# Patient Record
Sex: Female | Born: 1945 | Race: White | Hispanic: No | Marital: Married | Smoking: Former smoker
Health system: Southern US, Community
[De-identification: ages and names within clinical notes are randomized; demographics above are authoritative.]

## PROBLEM LIST (undated history)

## (undated) DIAGNOSIS — Z8719 Personal history of other diseases of the digestive system: Secondary | ICD-10-CM

## (undated) DIAGNOSIS — Z8619 Personal history of other infectious and parasitic diseases: Secondary | ICD-10-CM

## (undated) DIAGNOSIS — I10 Essential (primary) hypertension: Secondary | ICD-10-CM

## (undated) DIAGNOSIS — T7840XA Allergy, unspecified, initial encounter: Secondary | ICD-10-CM

## (undated) DIAGNOSIS — I639 Cerebral infarction, unspecified: Secondary | ICD-10-CM

## (undated) DIAGNOSIS — K2950 Unspecified chronic gastritis without bleeding: Secondary | ICD-10-CM

## (undated) DIAGNOSIS — I480 Paroxysmal atrial fibrillation: Secondary | ICD-10-CM

## (undated) DIAGNOSIS — I499 Cardiac arrhythmia, unspecified: Secondary | ICD-10-CM

## (undated) DIAGNOSIS — J449 Chronic obstructive pulmonary disease, unspecified: Secondary | ICD-10-CM

## (undated) DIAGNOSIS — F32A Depression, unspecified: Secondary | ICD-10-CM

## (undated) DIAGNOSIS — K219 Gastro-esophageal reflux disease without esophagitis: Secondary | ICD-10-CM

## (undated) DIAGNOSIS — Z950 Presence of cardiac pacemaker: Secondary | ICD-10-CM

## (undated) DIAGNOSIS — F329 Major depressive disorder, single episode, unspecified: Secondary | ICD-10-CM

## (undated) DIAGNOSIS — E039 Hypothyroidism, unspecified: Secondary | ICD-10-CM

## (undated) DIAGNOSIS — R32 Unspecified urinary incontinence: Secondary | ICD-10-CM

## (undated) DIAGNOSIS — K5790 Diverticulosis of intestine, part unspecified, without perforation or abscess without bleeding: Secondary | ICD-10-CM

## (undated) DIAGNOSIS — E785 Hyperlipidemia, unspecified: Secondary | ICD-10-CM

## (undated) DIAGNOSIS — E0500 Thyrotoxicosis with diffuse goiter without thyrotoxic crisis or storm: Secondary | ICD-10-CM

## (undated) DIAGNOSIS — M2040 Other hammer toe(s) (acquired), unspecified foot: Secondary | ICD-10-CM

## (undated) DIAGNOSIS — M2011 Hallux valgus (acquired), right foot: Secondary | ICD-10-CM

## (undated) DIAGNOSIS — G8929 Other chronic pain: Secondary | ICD-10-CM

## (undated) HISTORY — PX: APPENDECTOMY: SHX54

## (undated) HISTORY — PX: EYE SURGERY: SHX253

## (undated) HISTORY — PX: OTHER SURGICAL HISTORY: SHX169

## (undated) HISTORY — PX: FOOT SURGERY: SHX648

## (undated) HISTORY — DX: Essential (primary) hypertension: I10

## (undated) HISTORY — PX: TONSILLECTOMY: SUR1361

## (undated) HISTORY — DX: Allergy, unspecified, initial encounter: T78.40XA

## (undated) HISTORY — DX: Unspecified urinary incontinence: R32

## (undated) HISTORY — DX: Gastro-esophageal reflux disease without esophagitis: K21.9

## (undated) HISTORY — PX: VAGINAL HYSTERECTOMY: SUR661

---

## 1998-08-24 HISTORY — PX: COLONOSCOPY: SHX174

## 2005-01-05 ENCOUNTER — Ambulatory Visit: Payer: Self-pay | Admitting: Family Medicine

## 2005-01-23 ENCOUNTER — Ambulatory Visit: Payer: Self-pay | Admitting: Family Medicine

## 2005-08-21 ENCOUNTER — Ambulatory Visit: Payer: Self-pay | Admitting: Podiatry

## 2006-03-01 ENCOUNTER — Ambulatory Visit: Payer: Self-pay | Admitting: Family Medicine

## 2008-04-02 ENCOUNTER — Ambulatory Visit: Payer: Self-pay | Admitting: Family Medicine

## 2008-04-04 ENCOUNTER — Ambulatory Visit: Payer: Self-pay | Admitting: Family Medicine

## 2008-10-10 ENCOUNTER — Ambulatory Visit: Payer: Self-pay | Admitting: Family Medicine

## 2008-11-06 ENCOUNTER — Ambulatory Visit: Payer: Self-pay | Admitting: Family Medicine

## 2009-04-08 ENCOUNTER — Ambulatory Visit: Payer: Self-pay | Admitting: Family Medicine

## 2010-05-26 ENCOUNTER — Ambulatory Visit: Payer: Self-pay | Admitting: Family Medicine

## 2010-08-12 ENCOUNTER — Ambulatory Visit: Payer: Self-pay | Admitting: Family Medicine

## 2011-06-18 ENCOUNTER — Ambulatory Visit: Payer: Self-pay | Admitting: Family Medicine

## 2011-10-21 ENCOUNTER — Ambulatory Visit: Payer: Self-pay | Admitting: Family Medicine

## 2011-12-02 DIAGNOSIS — Z45018 Encounter for adjustment and management of other part of cardiac pacemaker: Secondary | ICD-10-CM | POA: Insufficient documentation

## 2012-09-22 DIAGNOSIS — Z8659 Personal history of other mental and behavioral disorders: Secondary | ICD-10-CM | POA: Insufficient documentation

## 2013-03-16 ENCOUNTER — Ambulatory Visit: Payer: Self-pay | Admitting: Family Medicine

## 2013-07-14 DIAGNOSIS — Z45018 Encounter for adjustment and management of other part of cardiac pacemaker: Secondary | ICD-10-CM | POA: Diagnosis not present

## 2013-08-04 DIAGNOSIS — J019 Acute sinusitis, unspecified: Secondary | ICD-10-CM | POA: Diagnosis not present

## 2013-10-05 DIAGNOSIS — I442 Atrioventricular block, complete: Secondary | ICD-10-CM | POA: Diagnosis not present

## 2013-10-27 DIAGNOSIS — Z111 Encounter for screening for respiratory tuberculosis: Secondary | ICD-10-CM | POA: Diagnosis not present

## 2013-10-27 DIAGNOSIS — I10 Essential (primary) hypertension: Secondary | ICD-10-CM | POA: Diagnosis not present

## 2013-10-27 DIAGNOSIS — E039 Hypothyroidism, unspecified: Secondary | ICD-10-CM | POA: Diagnosis not present

## 2013-10-27 DIAGNOSIS — E785 Hyperlipidemia, unspecified: Secondary | ICD-10-CM | POA: Diagnosis not present

## 2013-10-27 DIAGNOSIS — R894 Abnormal immunological findings in specimens from other organs, systems and tissues: Secondary | ICD-10-CM | POA: Diagnosis not present

## 2013-11-16 DIAGNOSIS — E039 Hypothyroidism, unspecified: Secondary | ICD-10-CM | POA: Diagnosis not present

## 2013-11-16 DIAGNOSIS — E785 Hyperlipidemia, unspecified: Secondary | ICD-10-CM | POA: Diagnosis not present

## 2013-11-16 DIAGNOSIS — I10 Essential (primary) hypertension: Secondary | ICD-10-CM | POA: Diagnosis not present

## 2013-11-16 DIAGNOSIS — I43 Cardiomyopathy in diseases classified elsewhere: Secondary | ICD-10-CM | POA: Diagnosis not present

## 2014-01-16 DIAGNOSIS — Z950 Presence of cardiac pacemaker: Secondary | ICD-10-CM | POA: Diagnosis not present

## 2014-03-13 DIAGNOSIS — M722 Plantar fascial fibromatosis: Secondary | ICD-10-CM | POA: Diagnosis not present

## 2014-03-13 DIAGNOSIS — M2040 Other hammer toe(s) (acquired), unspecified foot: Secondary | ICD-10-CM | POA: Diagnosis not present

## 2014-03-13 DIAGNOSIS — M79609 Pain in unspecified limb: Secondary | ICD-10-CM | POA: Diagnosis not present

## 2014-03-23 DIAGNOSIS — M722 Plantar fascial fibromatosis: Secondary | ICD-10-CM | POA: Diagnosis not present

## 2014-03-23 DIAGNOSIS — D485 Neoplasm of uncertain behavior of skin: Secondary | ICD-10-CM | POA: Diagnosis not present

## 2014-04-06 DIAGNOSIS — Z01818 Encounter for other preprocedural examination: Secondary | ICD-10-CM | POA: Diagnosis not present

## 2014-04-06 DIAGNOSIS — E039 Hypothyroidism, unspecified: Secondary | ICD-10-CM | POA: Diagnosis not present

## 2014-04-10 ENCOUNTER — Ambulatory Visit: Payer: Self-pay | Admitting: Podiatry

## 2014-04-10 DIAGNOSIS — M722 Plantar fascial fibromatosis: Secondary | ICD-10-CM | POA: Diagnosis not present

## 2014-04-10 DIAGNOSIS — M2040 Other hammer toe(s) (acquired), unspecified foot: Secondary | ICD-10-CM | POA: Diagnosis not present

## 2014-04-13 ENCOUNTER — Ambulatory Visit: Payer: Self-pay | Admitting: Podiatry

## 2014-04-13 DIAGNOSIS — E079 Disorder of thyroid, unspecified: Secondary | ICD-10-CM | POA: Diagnosis not present

## 2014-04-13 DIAGNOSIS — Z87891 Personal history of nicotine dependence: Secondary | ICD-10-CM | POA: Diagnosis not present

## 2014-04-13 DIAGNOSIS — J449 Chronic obstructive pulmonary disease, unspecified: Secondary | ICD-10-CM | POA: Diagnosis not present

## 2014-04-13 DIAGNOSIS — Z79899 Other long term (current) drug therapy: Secondary | ICD-10-CM | POA: Diagnosis not present

## 2014-04-13 DIAGNOSIS — Z7982 Long term (current) use of aspirin: Secondary | ICD-10-CM | POA: Diagnosis not present

## 2014-04-13 DIAGNOSIS — M25579 Pain in unspecified ankle and joints of unspecified foot: Secondary | ICD-10-CM | POA: Diagnosis not present

## 2014-04-13 DIAGNOSIS — Z880 Allergy status to penicillin: Secondary | ICD-10-CM | POA: Diagnosis not present

## 2014-04-13 DIAGNOSIS — M722 Plantar fascial fibromatosis: Secondary | ICD-10-CM | POA: Diagnosis not present

## 2014-04-13 DIAGNOSIS — R0989 Other specified symptoms and signs involving the circulatory and respiratory systems: Secondary | ICD-10-CM | POA: Diagnosis not present

## 2014-04-13 DIAGNOSIS — R0609 Other forms of dyspnea: Secondary | ICD-10-CM | POA: Diagnosis not present

## 2014-04-13 DIAGNOSIS — M79609 Pain in unspecified limb: Secondary | ICD-10-CM | POA: Diagnosis not present

## 2014-04-13 DIAGNOSIS — Q6689 Other  specified congenital deformities of feet: Secondary | ICD-10-CM | POA: Diagnosis not present

## 2014-04-13 DIAGNOSIS — M2040 Other hammer toe(s) (acquired), unspecified foot: Secondary | ICD-10-CM | POA: Diagnosis not present

## 2014-04-13 DIAGNOSIS — D481 Neoplasm of uncertain behavior of connective and other soft tissue: Secondary | ICD-10-CM | POA: Diagnosis not present

## 2014-04-13 DIAGNOSIS — Z950 Presence of cardiac pacemaker: Secondary | ICD-10-CM | POA: Diagnosis not present

## 2014-04-17 DIAGNOSIS — M79609 Pain in unspecified limb: Secondary | ICD-10-CM | POA: Diagnosis not present

## 2014-04-18 LAB — PATHOLOGY REPORT

## 2014-04-20 DIAGNOSIS — I498 Other specified cardiac arrhythmias: Secondary | ICD-10-CM | POA: Diagnosis not present

## 2014-04-20 DIAGNOSIS — I442 Atrioventricular block, complete: Secondary | ICD-10-CM | POA: Diagnosis not present

## 2014-04-20 DIAGNOSIS — Z45018 Encounter for adjustment and management of other part of cardiac pacemaker: Secondary | ICD-10-CM | POA: Diagnosis not present

## 2014-06-05 DIAGNOSIS — M79672 Pain in left foot: Secondary | ICD-10-CM | POA: Diagnosis not present

## 2014-06-13 DIAGNOSIS — Z23 Encounter for immunization: Secondary | ICD-10-CM | POA: Diagnosis not present

## 2014-06-18 DIAGNOSIS — J329 Chronic sinusitis, unspecified: Secondary | ICD-10-CM | POA: Diagnosis not present

## 2014-06-18 DIAGNOSIS — J209 Acute bronchitis, unspecified: Secondary | ICD-10-CM | POA: Diagnosis not present

## 2014-06-18 DIAGNOSIS — J309 Allergic rhinitis, unspecified: Secondary | ICD-10-CM | POA: Diagnosis not present

## 2014-07-23 ENCOUNTER — Ambulatory Visit: Payer: Self-pay | Admitting: Family Medicine

## 2014-07-23 DIAGNOSIS — R0981 Nasal congestion: Secondary | ICD-10-CM | POA: Diagnosis not present

## 2014-07-23 DIAGNOSIS — R05 Cough: Secondary | ICD-10-CM | POA: Diagnosis not present

## 2014-07-23 DIAGNOSIS — J209 Acute bronchitis, unspecified: Secondary | ICD-10-CM | POA: Diagnosis not present

## 2014-07-25 DIAGNOSIS — J209 Acute bronchitis, unspecified: Secondary | ICD-10-CM | POA: Diagnosis not present

## 2014-08-01 DIAGNOSIS — Z45018 Encounter for adjustment and management of other part of cardiac pacemaker: Secondary | ICD-10-CM | POA: Diagnosis not present

## 2014-08-03 DIAGNOSIS — H25013 Cortical age-related cataract, bilateral: Secondary | ICD-10-CM | POA: Diagnosis not present

## 2014-08-06 DIAGNOSIS — I43 Cardiomyopathy in diseases classified elsewhere: Secondary | ICD-10-CM | POA: Diagnosis not present

## 2014-08-06 DIAGNOSIS — E039 Hypothyroidism, unspecified: Secondary | ICD-10-CM | POA: Diagnosis not present

## 2014-08-06 DIAGNOSIS — E784 Other hyperlipidemia: Secondary | ICD-10-CM | POA: Diagnosis not present

## 2014-08-06 DIAGNOSIS — I10 Essential (primary) hypertension: Secondary | ICD-10-CM | POA: Diagnosis not present

## 2014-08-06 DIAGNOSIS — F329 Major depressive disorder, single episode, unspecified: Secondary | ICD-10-CM | POA: Diagnosis not present

## 2014-08-06 DIAGNOSIS — J309 Allergic rhinitis, unspecified: Secondary | ICD-10-CM | POA: Diagnosis not present

## 2014-09-03 DIAGNOSIS — E039 Hypothyroidism, unspecified: Secondary | ICD-10-CM | POA: Diagnosis not present

## 2014-09-03 DIAGNOSIS — I10 Essential (primary) hypertension: Secondary | ICD-10-CM | POA: Diagnosis not present

## 2014-09-03 DIAGNOSIS — I43 Cardiomyopathy in diseases classified elsewhere: Secondary | ICD-10-CM | POA: Diagnosis not present

## 2014-09-03 DIAGNOSIS — F329 Major depressive disorder, single episode, unspecified: Secondary | ICD-10-CM | POA: Diagnosis not present

## 2014-09-03 DIAGNOSIS — E784 Other hyperlipidemia: Secondary | ICD-10-CM | POA: Diagnosis not present

## 2014-10-22 DIAGNOSIS — E784 Other hyperlipidemia: Secondary | ICD-10-CM | POA: Diagnosis not present

## 2014-10-22 DIAGNOSIS — Z23 Encounter for immunization: Secondary | ICD-10-CM | POA: Diagnosis not present

## 2014-10-23 LAB — HM MAMMOGRAPHY

## 2014-10-24 ENCOUNTER — Ambulatory Visit: Payer: Self-pay | Admitting: Family Medicine

## 2014-10-24 DIAGNOSIS — Z1231 Encounter for screening mammogram for malignant neoplasm of breast: Secondary | ICD-10-CM | POA: Diagnosis not present

## 2014-11-07 DIAGNOSIS — I442 Atrioventricular block, complete: Secondary | ICD-10-CM | POA: Diagnosis not present

## 2014-11-07 DIAGNOSIS — Z45018 Encounter for adjustment and management of other part of cardiac pacemaker: Secondary | ICD-10-CM | POA: Diagnosis not present

## 2014-12-03 DIAGNOSIS — N61 Inflammatory disorders of breast: Secondary | ICD-10-CM | POA: Diagnosis not present

## 2014-12-03 DIAGNOSIS — B372 Candidiasis of skin and nail: Secondary | ICD-10-CM | POA: Diagnosis not present

## 2014-12-03 DIAGNOSIS — J209 Acute bronchitis, unspecified: Secondary | ICD-10-CM | POA: Diagnosis not present

## 2014-12-03 DIAGNOSIS — J301 Allergic rhinitis due to pollen: Secondary | ICD-10-CM | POA: Diagnosis not present

## 2014-12-07 ENCOUNTER — Ambulatory Visit
Admit: 2014-12-07 | Disposition: A | Discharge status: Home / Self Care | Payer: Self-pay | Attending: Family Medicine | Admitting: Family Medicine

## 2014-12-07 DIAGNOSIS — F339 Major depressive disorder, recurrent, unspecified: Secondary | ICD-10-CM | POA: Insufficient documentation

## 2014-12-07 DIAGNOSIS — Z01818 Encounter for other preprocedural examination: Secondary | ICD-10-CM | POA: Insufficient documentation

## 2014-12-07 DIAGNOSIS — J209 Acute bronchitis, unspecified: Secondary | ICD-10-CM | POA: Diagnosis not present

## 2014-12-07 DIAGNOSIS — J449 Chronic obstructive pulmonary disease, unspecified: Secondary | ICD-10-CM | POA: Diagnosis not present

## 2014-12-07 DIAGNOSIS — E7849 Other hyperlipidemia: Secondary | ICD-10-CM | POA: Insufficient documentation

## 2014-12-07 DIAGNOSIS — Z8639 Personal history of other endocrine, nutritional and metabolic disease: Secondary | ICD-10-CM | POA: Insufficient documentation

## 2014-12-07 DIAGNOSIS — E039 Hypothyroidism, unspecified: Secondary | ICD-10-CM | POA: Insufficient documentation

## 2014-12-07 DIAGNOSIS — I10 Essential (primary) hypertension: Secondary | ICD-10-CM | POA: Insufficient documentation

## 2014-12-07 DIAGNOSIS — R0989 Other specified symptoms and signs involving the circulatory and respiratory systems: Secondary | ICD-10-CM | POA: Diagnosis not present

## 2014-12-07 DIAGNOSIS — R079 Chest pain, unspecified: Secondary | ICD-10-CM | POA: Diagnosis not present

## 2014-12-07 DIAGNOSIS — R05 Cough: Secondary | ICD-10-CM | POA: Diagnosis not present

## 2014-12-07 DIAGNOSIS — R0602 Shortness of breath: Secondary | ICD-10-CM | POA: Diagnosis not present

## 2014-12-15 NOTE — Op Note (Signed)
PATIENT NAME:  Danielle Taylor, Danielle Taylor MR#:  [id number] DATE OF BIRTH:  [birth year]  DATE OF PROCEDURE:  04/13/2014  PREOPERATIVE DIAGNOSES: 1.  Left foot plantar fibroma.  2.  Hammertoe, left 3rd toe.   POSTOPERATIVE DIAGNOSES:  1.  Left foot plantar fibroma.  2.  Hammertoe, left 3rd toe.   PROCEDURES: 1.  Wide excision plantar fibroma, left foot.  2.  Proximal interphalangeal joint arthroplasty, left 3rd toe.  3.  Weil 3rd metatarsal osteotomy.   SURGEON: Dalton Molesworth A. Vickki Taylor, DPM  ANESTHESIA: IV sedation with ankle block.   HEMOSTASIS: Ankle tourniquet inflated to 250 mmHg for approximately 45 minutes.   COMPLICATIONS: None.   SPECIMEN: 5 x 2 x 1.5 cm plantar fibroma, left foot.   OPERATIVE INDICATIONS: This is a 69 year old female who has been seen in the outpatient clinic with the complaint of a painful fibroma on her left foot and a painful forefoot hammertoe and 3rd MTPJ. We have discussed surgical and nonsurgical intervention and she presents today for surgery. All risks, benefits, alternatives and complications associated with surgery were discussed with the patient and full informed consent has been given.   DESCRIPTION OF PROCEDURE: The patient was brought into the OR and placed on operating room table in the supine position. IV sedation was administered by the anesthesia team. An ankle block was administered by myself. Lidocaine with epinephrine was infiltrated along the plantar foot incision site. After sterile prep and drape, a curvilinear incision was made overlying the plantar fibroma on the medial band. Blunt dissection was taken down to the medial, lateral, distal and proximal portion of the fibroma. At this time, a wide excision was performed with approximately 5 mm of normal tissue along the excision site. A combination of sharp and blunt dissection was used. The plantar musculature and long flexor tendon was noted and retracted throughout the procedure. This was sent for  pathological examination. The wound was flushed with copious amounts of irrigation. All bleeders were Bovie cauterized. Layered closure was performed with a 4-0 Vicryl for the subcutaneous tissue and a 5-0 Monocryl undyed for the skin. Inflation of the tourniquet was then performed and a curvilinear incision was made beginning from the PIPJ of the 3rd toe proximal to the MTPJ. Sharp and blunt dissection was carried down to the long extensor tendon. Initially, dorsomedial and lateral aspect of the MTPJ capsulotomy was performed. The metatarsal head was noted and a Weil osteotomy was performed. The capital fragment was translocated laterally and stabilized with a 0.045 K wire. Next, a 2.0 mm screw using normal AO technique was placed from dorsal to plantar into the metatarsal head. Good alignment was noted and the MTPJ was in better position. There was still some mild abduction. A capsulotomy was performed laterally and a capsulorrhaphy medially to try to realign the 3rd MTPJ, although residual malalignment was noted. At this time, I felt a PIPJ arthroplasty with realignment of the extensor tendon was warranted. The PIPJ was then opened and the head of the proximal phalanx and base of the middle phalanx was resected of all articular cartilage. This was then stabilized with a 0.045 K wire from the PIPJ crossing the MTPJ into the metatarsal head. Good alignment was noted at this time. The long extensor tendon was then reapproximated just medial to its midline. All areas were flushed with saline and layered closure was performed with a 4-0 Vicryl for the deep layer and a 5-0 nylon for the skin. Then 0.5% Marcaine was placed around  all areas. She was placed in a well compressive sterile dressing. She will be discharged home on p.o. Percocet and nonweightbearing to this left foot. She tolerated the procedure and anesthesia well and was transported from the OR to the PACU with all vital signs stable and neurovascular status  intact.  ____________________________ Danielle Taylor, DPM jaf:sb D: 04/13/2014 09:48:59 ET T: 04/13/2014 11:37:35 ET JOB#: [id number]  cc: Larkin Ina A. Vickki Taylor, DPM, <Dictator> Saniah Schroeter DPM ELECTRONICALLY SIGNED 05/01/2014 13:50

## 2015-02-12 ENCOUNTER — Encounter: Payer: Self-pay | Admitting: *Deleted

## 2015-02-20 ENCOUNTER — Other Ambulatory Visit: Payer: Self-pay | Admitting: Family Medicine

## 2015-02-20 DIAGNOSIS — E785 Hyperlipidemia, unspecified: Secondary | ICD-10-CM

## 2015-03-04 ENCOUNTER — Ambulatory Visit (INDEPENDENT_AMBULATORY_CARE_PROVIDER_SITE_OTHER): Payer: Medicare Other | Admitting: Family Medicine

## 2015-03-04 ENCOUNTER — Encounter: Payer: Self-pay | Admitting: Family Medicine

## 2015-03-04 ENCOUNTER — Other Ambulatory Visit: Payer: Self-pay

## 2015-03-04 VITALS — BP 120/72 | HR 72 | Ht 65.0 in | Wt 189.0 lb

## 2015-03-04 DIAGNOSIS — J209 Acute bronchitis, unspecified: Secondary | ICD-10-CM | POA: Diagnosis not present

## 2015-03-04 DIAGNOSIS — J4520 Mild intermittent asthma, uncomplicated: Secondary | ICD-10-CM | POA: Diagnosis not present

## 2015-03-04 DIAGNOSIS — J45909 Unspecified asthma, uncomplicated: Secondary | ICD-10-CM | POA: Insufficient documentation

## 2015-03-04 MED ORDER — ALBUTEROL SULFATE (2.5 MG/3ML) 0.083% IN NEBU: 2.5000 mg | INHALATION_SOLUTION | Freq: Once | RESPIRATORY_TRACT | Status: DC

## 2015-03-04 MED ORDER — LEVOFLOXACIN 500 MG PO TABS
500.0000 mg | ORAL_TABLET | Freq: Every day | ORAL | Status: DC
Start: 2015-03-04 — End: 2015-05-20

## 2015-03-04 NOTE — Progress Notes (Signed)
Name: Danielle Taylor   MRN: [id number]    DOB: 1946-03-12   Date:03/04/2015       Progress Note  Subjective  Chief Complaint  Chief Complaint  Patient presents with  . Cough    no stuffiness or congestion    Cough This is a recurrent problem. The current episode started more than 1 month ago. The problem has been unchanged. The cough is non-productive. Associated symptoms include shortness of breath and wheezing. Pertinent negatives include no chest pain, chills, ear congestion, ear pain, fever, headaches, heartburn, hemoptysis, myalgias, nasal congestion, postnasal drip, rash or rhinorrhea. Nothing aggravates the symptoms. She has tried a beta-agonist inhaler and steroid inhaler for the symptoms. The treatment provided mild relief. Her past medical history is significant for asthma. There is no history of bronchiectasis, bronchitis, COPD, emphysema, environmental allergies or pneumonia.    No problem-specific assessment & plan notes found for this encounter.   No past medical history on file.  Past Surgical History  Procedure Laterality Date  . Vaginal hysterectomy    . Foot surgery    . Fibroid tumors      fingers and wrist  . Tonsillectomy    . Colonoscopy  2000    No family history on file.  History   Social History  . Marital Status: Married    Spouse Name: N/A  . Number of Children: N/A  . Years of Education: N/A   Occupational History  . Not on file.   Social History Main Topics  . Smoking status: Former Research scientist (life sciences)  . Smokeless tobacco: Not on file  . Alcohol Use: 0.0 oz/week    0 Standard drinks or equivalent per week  . Drug Use: No  . Sexual Activity: Not on file   Other Topics Concern  . Not on file   Social History Narrative    Allergies  Allergen Reactions  . Penicillins      Review of Systems  Constitutional: Negative for fever and chills.  HENT: Negative for ear pain, postnasal drip and rhinorrhea.   Respiratory: Positive for cough,  shortness of breath and wheezing. Negative for hemoptysis.   Cardiovascular: Negative for chest pain.  Gastrointestinal: Negative for heartburn.  Musculoskeletal: Negative for myalgias.  Skin: Negative for rash.  Neurological: Negative for headaches.  Endo/Heme/Allergies: Negative for environmental allergies.     Objective  Filed Vitals:   03/04/15 1509  BP: 120/72  Pulse: 72  Height: 5\' 5"  (1.651 m)  Weight: 189 lb (85.73 kg)  SpO2: 98%    Physical Exam    Assessment & Plan  Problem List Items Addressed This Visit      Respiratory   Acute bronchitis - Primary   Relevant Medications   levofloxacin (LEVAQUIN) 500 MG tablet   Reactive airway disease   Relevant Medications   albuterol (PROVENTIL) (2.5 MG/3ML) 0.083% nebulizer solution 2.5 mg (Start on 03/04/2015  4:00 PM)   Other Relevant Orders   Ambulatory referral to Pulmonology        Dr. Otilio Miu [facility]  03/04/2015

## 2015-03-05 ENCOUNTER — Other Ambulatory Visit: Payer: Self-pay | Admitting: Family Medicine

## 2015-03-05 DIAGNOSIS — I10 Essential (primary) hypertension: Secondary | ICD-10-CM

## 2015-03-15 DIAGNOSIS — Z1211 Encounter for screening for malignant neoplasm of colon: Secondary | ICD-10-CM | POA: Diagnosis not present

## 2015-03-25 DIAGNOSIS — Z01818 Encounter for other preprocedural examination: Secondary | ICD-10-CM | POA: Diagnosis not present

## 2015-03-25 DIAGNOSIS — R0602 Shortness of breath: Secondary | ICD-10-CM | POA: Diagnosis not present

## 2015-03-25 DIAGNOSIS — R05 Cough: Secondary | ICD-10-CM | POA: Diagnosis not present

## 2015-03-25 DIAGNOSIS — J449 Chronic obstructive pulmonary disease, unspecified: Secondary | ICD-10-CM | POA: Diagnosis not present

## 2015-04-09 ENCOUNTER — Other Ambulatory Visit: Payer: Self-pay | Admitting: Family Medicine

## 2015-04-09 DIAGNOSIS — F329 Major depressive disorder, single episode, unspecified: Secondary | ICD-10-CM

## 2015-04-09 DIAGNOSIS — F32A Depression, unspecified: Secondary | ICD-10-CM

## 2015-04-15 DIAGNOSIS — Z01818 Encounter for other preprocedural examination: Secondary | ICD-10-CM | POA: Diagnosis not present

## 2015-04-15 DIAGNOSIS — R05 Cough: Secondary | ICD-10-CM | POA: Diagnosis not present

## 2015-04-15 DIAGNOSIS — J9801 Acute bronchospasm: Secondary | ICD-10-CM | POA: Diagnosis not present

## 2015-04-25 ENCOUNTER — Encounter: Payer: Self-pay | Admitting: *Deleted

## 2015-04-26 ENCOUNTER — Encounter: Payer: Self-pay | Admitting: Gastroenterology

## 2015-04-26 ENCOUNTER — Ambulatory Visit: Payer: Medicare Other | Admitting: Anesthesiology

## 2015-04-26 ENCOUNTER — Encounter
Admission: RE | Disposition: A | Discharge status: Home / Self Care | Payer: Self-pay | Source: Ambulatory Visit | Attending: Gastroenterology

## 2015-04-26 ENCOUNTER — Ambulatory Visit
Admission: RE | Admit: 2015-04-26 | Discharge: 2015-04-26 | Disposition: A | Discharge status: Home / Self Care | Payer: Medicare Other | Source: Ambulatory Visit | Attending: Gastroenterology | Admitting: Gastroenterology

## 2015-04-26 DIAGNOSIS — Z87891 Personal history of nicotine dependence: Secondary | ICD-10-CM | POA: Diagnosis not present

## 2015-04-26 DIAGNOSIS — K648 Other hemorrhoids: Secondary | ICD-10-CM | POA: Diagnosis not present

## 2015-04-26 DIAGNOSIS — Z90710 Acquired absence of both cervix and uterus: Secondary | ICD-10-CM | POA: Diagnosis not present

## 2015-04-26 DIAGNOSIS — E785 Hyperlipidemia, unspecified: Secondary | ICD-10-CM | POA: Insufficient documentation

## 2015-04-26 DIAGNOSIS — E039 Hypothyroidism, unspecified: Secondary | ICD-10-CM | POA: Diagnosis not present

## 2015-04-26 DIAGNOSIS — E0500 Thyrotoxicosis with diffuse goiter without thyrotoxic crisis or storm: Secondary | ICD-10-CM | POA: Diagnosis not present

## 2015-04-26 DIAGNOSIS — Z79899 Other long term (current) drug therapy: Secondary | ICD-10-CM | POA: Diagnosis not present

## 2015-04-26 DIAGNOSIS — Z7982 Long term (current) use of aspirin: Secondary | ICD-10-CM | POA: Insufficient documentation

## 2015-04-26 DIAGNOSIS — Z1211 Encounter for screening for malignant neoplasm of colon: Secondary | ICD-10-CM | POA: Insufficient documentation

## 2015-04-26 DIAGNOSIS — K5730 Diverticulosis of large intestine without perforation or abscess without bleeding: Secondary | ICD-10-CM | POA: Insufficient documentation

## 2015-04-26 DIAGNOSIS — Z950 Presence of cardiac pacemaker: Secondary | ICD-10-CM | POA: Insufficient documentation

## 2015-04-26 DIAGNOSIS — K2950 Unspecified chronic gastritis without bleeding: Secondary | ICD-10-CM | POA: Insufficient documentation

## 2015-04-26 DIAGNOSIS — Z9889 Other specified postprocedural states: Secondary | ICD-10-CM | POA: Diagnosis not present

## 2015-04-26 DIAGNOSIS — F329 Major depressive disorder, single episode, unspecified: Secondary | ICD-10-CM | POA: Diagnosis not present

## 2015-04-26 DIAGNOSIS — Z9049 Acquired absence of other specified parts of digestive tract: Secondary | ICD-10-CM | POA: Insufficient documentation

## 2015-04-26 DIAGNOSIS — J449 Chronic obstructive pulmonary disease, unspecified: Secondary | ICD-10-CM | POA: Insufficient documentation

## 2015-04-26 DIAGNOSIS — K640 First degree hemorrhoids: Secondary | ICD-10-CM | POA: Diagnosis not present

## 2015-04-26 DIAGNOSIS — K5790 Diverticulosis of intestine, part unspecified, without perforation or abscess without bleeding: Secondary | ICD-10-CM | POA: Diagnosis not present

## 2015-04-26 DIAGNOSIS — Z880 Allergy status to penicillin: Secondary | ICD-10-CM | POA: Diagnosis not present

## 2015-04-26 HISTORY — DX: Hypothyroidism, unspecified: E03.9

## 2015-04-26 HISTORY — PX: COLONOSCOPY WITH PROPOFOL: SHX5780

## 2015-04-26 HISTORY — DX: Depression, unspecified: F32.A

## 2015-04-26 HISTORY — DX: Personal history of other diseases of the digestive system: Z87.19

## 2015-04-26 HISTORY — DX: Major depressive disorder, single episode, unspecified: F32.9

## 2015-04-26 HISTORY — DX: Chronic obstructive pulmonary disease, unspecified: J44.9

## 2015-04-26 HISTORY — DX: Unspecified chronic gastritis without bleeding: K29.50

## 2015-04-26 HISTORY — DX: Cardiac arrhythmia, unspecified: I49.9

## 2015-04-26 HISTORY — DX: Thyrotoxicosis with diffuse goiter without thyrotoxic crisis or storm: E05.00

## 2015-04-26 HISTORY — DX: Presence of cardiac pacemaker: Z95.0

## 2015-04-26 HISTORY — DX: Hyperlipidemia, unspecified: E78.5

## 2015-04-26 SURGERY — COLONOSCOPY WITH PROPOFOL
Anesthesia: General

## 2015-04-26 MED ORDER — MIDAZOLAM HCL 2 MG/2ML IJ SOLN
INTRAMUSCULAR | Status: DC | PRN
  Administered 2015-04-26: 1 mg via INTRAVENOUS

## 2015-04-26 MED ORDER — METOPROLOL SUCCINATE ER 50 MG PO TB24
50.0000 mg | ORAL_TABLET | Freq: Once | ORAL | Status: AC
  Administered 2015-04-26: 50 mg via ORAL
  Filled 2015-04-26: qty 1

## 2015-04-26 MED ORDER — SODIUM CHLORIDE 0.9 % IV SOLN
INTRAVENOUS | Status: DC
  Administered 2015-04-26: 1000 mL via INTRAVENOUS
  Administered 2015-04-26: 09:00:00 via INTRAVENOUS

## 2015-04-26 MED ORDER — SODIUM CHLORIDE 0.9 % IV SOLN: INTRAVENOUS | Status: DC

## 2015-04-26 MED ORDER — PROPOFOL 10 MG/ML IV BOLUS
INTRAVENOUS | Status: DC | PRN
  Administered 2015-04-26 (×3): 20 mg via INTRAVENOUS
  Administered 2015-04-26: 10 mg via INTRAVENOUS
  Administered 2015-04-26 (×3): 20 mg via INTRAVENOUS

## 2015-04-26 NOTE — Transfer of Care (Signed)
Immediate Anesthesia Transfer of Care Note  Patient: Danielle Taylor [facility]  Procedure(s) Performed: Procedure(s): COLONOSCOPY WITH PROPOFOL (N/A)  Patient Location: PACU and Endoscopy Unit  Anesthesia Type:General  Level of Consciousness: alert   Airway & Oxygen Therapy: Patient Spontanous Breathing and Patient connected to nasal cannula oxygen  Post-op Assessment: Report given to RN and Post -op Vital signs reviewed and stable  Post vital signs: stable  Last Vitals:  Filed Vitals:   04/26/15 0753  BP: 138/85  Pulse: 92  Temp: 36.2 C  Resp: 18    Complications: No apparent anesthesia complications

## 2015-04-26 NOTE — H&P (Signed)
Primary Care Physician:  Danielle Miu, MD Primary Gastroenterologist:  Dr. Candace Taylor  Pre-Procedure History & Physical: HPI:  Danielle Taylor is a 69 y.o. female is here for an colonoscopy.   Past Medical History  Diagnosis Date  . Dysrhythmia     Complete Heart Block  . Presence of permanent cardiac pacemaker     2012  . Hypothyroidism   . Depression   . Hyperlipidemia   . COPD (chronic obstructive pulmonary disease)   . History of hiatal hernia   . Graves disease   . Gastritis, chronic     Past Surgical History  Procedure Laterality Date  . Vaginal hysterectomy    . Foot surgery    . Fibroid tumors      fingers and wrist  . Tonsillectomy    . Colonoscopy  2000  . Appendectomy      Prior to Admission medications   Medication Sig Start Date End Date Taking? Authorizing Provider  albuterol (PROVENTIL HFA;VENTOLIN HFA) 108 (90 BASE) MCG/ACT inhaler Inhale 1 puff into the lungs 3 (three) times daily. 12/07/14  Yes Historical Provider, MD  aspirin EC 81 MG tablet Take 81 mg by mouth daily.   Yes Historical Provider, MD  atorvastatin (LIPITOR) 20 MG tablet Take 20 mg by mouth daily.   Yes Historical Provider, MD  citalopram (CELEXA) 40 MG tablet TAKE 1 TABLET BY MOUTH EVERY DAY 04/09/15  Yes Juline Patch, MD  gemfibrozil (LOPID) 600 MG tablet TAKE 1 TABLET BY MOUTH DAILY 02/21/15  Yes Juline Patch, MD  levofloxacin (LEVAQUIN) 500 MG tablet Take 1 tablet (500 mg total) by mouth daily. 03/04/15  Yes Juline Patch, MD  lisinopril (PRINIVIL,ZESTRIL) 5 MG tablet TAKE 1 TABLET BY MOUTH EVERY DAY 03/05/15  Yes Juline Patch, MD  loratadine (CLARITIN) 10 MG tablet Take 1 tablet by mouth daily. 08/06/14  Yes Historical Provider, MD  metoprolol succinate (TOPROL-XL) 50 MG 24 hr tablet Take 1 tablet by mouth daily. 09/03/14  Yes Historical Provider, MD  nystatin cream (MYCOSTATIN) 1 application 2 (two) times daily. 12/03/14  Yes Historical Provider, MD  simvastatin (ZOCOR) 20 MG  tablet Take 1 tablet by mouth daily. 09/03/14  Yes Historical Provider, MD  levothyroxine (SYNTHROID, LEVOTHROID) 112 MCG tablet Take 1 tablet by mouth daily. 09/03/14   Historical Provider, MD    Allergies as of 03/29/2015 - Review Complete 03/04/2015  Allergen Reaction Noted  . Penicillins  12/07/2014    History reviewed. No pertinent family history.  Social History   Social History  . Marital Status: Married    Spouse Name: N/A  . Number of Children: N/A  . Years of Education: N/A   Occupational History  . Not on file.   Social History Main Topics  . Smoking status: Former Research scientist (life sciences)  . Smokeless tobacco: Never Used  . Alcohol Use: 0.0 oz/week    0 Standard drinks or equivalent per week  . Drug Use: No  . Sexual Activity: Not on file   Other Topics Concern  . Not on file   Social History Narrative    Review of Systems: See HPI, otherwise negative ROS  Physical Exam: BP 138/85 mmHg  Pulse 92  Temp(Src) 97.2 F (36.2 C) (Tympanic)  Resp 18  Ht 5\' 5"  (1.651 m)  Wt 81.647 kg (180 lb)  BMI 29.95 kg/m2  SpO2 96% General:   Alert,  pleasant and cooperative in NAD Head:  Normocephalic and atraumatic. Neck:  Supple;  no masses or thyromegaly. Lungs:  Clear throughout to auscultation.    Heart:  Regular rate and rhythm. Abdomen:  Soft, nontender and nondistended. Normal bowel sounds, without guarding, and without rebound.   Neurologic:  Alert and  oriented x4;  grossly normal neurologically.  Impression/Plan: Danielle Taylor is here for an colonoscopy to be performed for screening.  Risks, benefits, limitations, and alternatives regarding  colonoscopy have been reviewed with the patient.  Questions have been answered.  All parties agreeable.   Danielle Taylor, Danielle Dawn, MD  04/26/2015, 8:10 AM

## 2015-04-26 NOTE — Op Note (Signed)
[facility] Patient Name: Danielle Taylor Procedure Date: 04/26/2015 8:40 AM MRN: [id number] Account #: [id number] Date of Birth: April 04, 1946 Admit Type: Outpatient Age: 69 Room: [facility] ENDO ROOM 4 Gender: Female Note Status: Finalized Procedure:         Colonoscopy Indications:       Screening for colorectal malignant neoplasm Providers:         Lupita Dawn. Candace Cruise, MD Referring MD:      Juline Patch, MD (Referring MD) Medicines:         Monitored Anesthesia Care Complications:     No immediate complications. Procedure:         Pre-Anesthesia Assessment:                    - Prior to the procedure, a History and Physical was                     performed, and patient medications, allergies and                     sensitivities were reviewed. The patient's tolerance of                     previous anesthesia was reviewed.                    - The risks and benefits of the procedure and the sedation                     options and risks were discussed with the patient. All                     questions were answered and informed consent was obtained.                    - After reviewing the risks and benefits, the patient was                     deemed in satisfactory condition to undergo the procedure.                    After obtaining informed consent, the colonoscope was                     passed under direct vision. Throughout the procedure, the                     patient's blood pressure, pulse, and oxygen saturations                     were monitored continuously. The Colonoscope was                     introduced through the anus and advanced to the the cecum,                     identified by appendiceal orifice and ileocecal valve. The                     colonoscopy was performed without difficulty. The patient                     tolerated the procedure well. The quality of the bowel  preparation was fair. Findings:  A few small-mouthed diverticula were found in the sigmoid colon.      The exam was otherwise without abnormality.      The perianal exam findings include non-thrombosed internal hemorrhoids. Impression:        - Diverticulosis in the sigmoid colon.                    - The examination was otherwise normal.                    - Non-thrombosed internal hemorrhoids found on perianal                     exam.                    - No specimens collected. Recommendation:    - Discharge patient to home.                    - Repeat colonoscopy in 10 years for surveillance.                    - The findings and recommendations were discussed with the                     patient. Procedure Code(s): --- Professional ---                    [id number], Colonoscopy, flexible; diagnostic, including                     collection of specimen(s) by brushing or washing, when                     performed (separate procedure) Diagnosis Code(s): --- Professional ---                    Z12.11, Encounter for screening for malignant neoplasm of                     colon                    K64.8, Other hemorrhoids                    K57.30, Diverticulosis of large intestine without                     perforation or abscess without bleeding CPT copyright 2014 American Medical Association. All rights reserved. The codes documented in this report are preliminary and upon coder review may  be revised to meet current compliance requirements. Hulen Luster, MD 04/26/2015 9:07:11 AM This report has been signed electronically. Number of Addenda: 0 Note Initiated On: 04/26/2015 8:40 AM Scope Withdrawal Time: 0 hours 7 minutes 7 seconds  Total Procedure Duration: 0 hours 12 minutes 44 seconds       [facility]

## 2015-04-26 NOTE — Anesthesia Postprocedure Evaluation (Signed)
  Anesthesia Post-op Note  Patient: Danielle Taylor [facility]  Procedure(s) Performed: Procedure(s): COLONOSCOPY WITH PROPOFOL (N/A)  Anesthesia type:General  Patient location: PACU  Post pain: Pain level controlled  Post assessment: Post-op Vital signs reviewed, Patient's Cardiovascular Status Stable, Respiratory Function Stable, Patent Airway and No signs of Nausea or vomiting  Post vital signs: Reviewed and stable  Last Vitals:  Filed Vitals:   04/26/15 0911  BP:   Pulse: 81  Temp: 36.8 C  Resp:     Level of consciousness: awake, alert  and patient cooperative  Complications: No apparent anesthesia complications

## 2015-04-26 NOTE — Anesthesia Preprocedure Evaluation (Signed)
Anesthesia Evaluation  Patient identified by MRN, date of birth, ID band Patient awake    Reviewed: Allergy & Precautions, H&P , NPO status , Patient's Chart, lab work & pertinent test results, reviewed documented beta blocker date and time   Airway Mallampati: II  TM Distance: >3 FB Neck ROM: full    Dental no notable dental hx. (+) Teeth Intact   Pulmonary neg pulmonary ROS, COPDformer smoker,  breath sounds clear to auscultation  Pulmonary exam normal       Cardiovascular Exercise Tolerance: Good hypertension, negative cardio ROS  + dysrhythmias + pacemaker Rhythm:regular Rate:Normal     Neuro/Psych PSYCHIATRIC DISORDERS negative neurological ROS  negative psych ROS   GI/Hepatic negative GI ROS, Neg liver ROS, hiatal hernia,   Endo/Other  negative endocrine ROSHypothyroidism Hyperthyroidism   Renal/GU negative Renal ROS  negative genitourinary   Musculoskeletal   Abdominal   Peds  Hematology negative hematology ROS (+)   Anesthesia Other Findings   Reproductive/Obstetrics negative OB ROS                             Anesthesia Physical Anesthesia Plan  ASA: III  Anesthesia Plan: General   Post-op Pain Management:    Induction:   Airway Management Planned:   Additional Equipment:   Intra-op Plan:   Post-operative Plan:   Informed Consent: I have reviewed the patients History and Physical, chart, labs and discussed the procedure including the risks, benefits and alternatives for the proposed anesthesia with the patient or authorized representative who has indicated his/her understanding and acceptance.   Dental Advisory Given  Plan Discussed with: CRNA  Anesthesia Plan Comments:         Anesthesia Quick Evaluation

## 2015-05-04 ENCOUNTER — Other Ambulatory Visit: Payer: Self-pay | Admitting: Family Medicine

## 2015-05-04 DIAGNOSIS — E039 Hypothyroidism, unspecified: Secondary | ICD-10-CM

## 2015-05-12 ENCOUNTER — Other Ambulatory Visit: Payer: Self-pay | Admitting: Family Medicine

## 2015-05-12 DIAGNOSIS — E039 Hypothyroidism, unspecified: Secondary | ICD-10-CM

## 2015-05-13 DIAGNOSIS — R05 Cough: Secondary | ICD-10-CM | POA: Diagnosis not present

## 2015-05-13 DIAGNOSIS — J449 Chronic obstructive pulmonary disease, unspecified: Secondary | ICD-10-CM | POA: Diagnosis not present

## 2015-05-15 DIAGNOSIS — Z950 Presence of cardiac pacemaker: Secondary | ICD-10-CM

## 2015-05-15 DIAGNOSIS — Z45018 Encounter for adjustment and management of other part of cardiac pacemaker: Secondary | ICD-10-CM | POA: Diagnosis not present

## 2015-05-15 DIAGNOSIS — I498 Other specified cardiac arrhythmias: Secondary | ICD-10-CM | POA: Insufficient documentation

## 2015-05-20 ENCOUNTER — Encounter: Payer: Self-pay | Admitting: Family Medicine

## 2015-05-20 ENCOUNTER — Ambulatory Visit (INDEPENDENT_AMBULATORY_CARE_PROVIDER_SITE_OTHER): Payer: Medicare Other | Admitting: Family Medicine

## 2015-05-20 VITALS — BP 140/100 | HR 68 | Ht 65.0 in | Wt 188.0 lb

## 2015-05-20 DIAGNOSIS — R059 Cough, unspecified: Secondary | ICD-10-CM

## 2015-05-20 DIAGNOSIS — R05 Cough: Secondary | ICD-10-CM | POA: Diagnosis not present

## 2015-05-20 DIAGNOSIS — I10 Essential (primary) hypertension: Secondary | ICD-10-CM | POA: Diagnosis not present

## 2015-05-20 DIAGNOSIS — E039 Hypothyroidism, unspecified: Secondary | ICD-10-CM

## 2015-05-20 MED ORDER — LOSARTAN POTASSIUM 50 MG PO TABS: 50.0000 mg | ORAL_TABLET | Freq: Every day | ORAL | Status: DC

## 2015-05-20 NOTE — Progress Notes (Signed)
Name: Danielle Taylor   MRN: [id number]    DOB: 1946/03/28   Date:05/20/2015       Progress Note  Subjective  Chief Complaint  Chief Complaint  Patient presents with  . Altered Mental Status    "forgetting what she is saying midway through a sentence, repeating or asking the same question 3-5 times a day" been going on approx 6 months    HPI Comments: Diaphoresis/6wks  Altered Mental Status This is a recurrent problem. The current episode started more than 1 month ago. The problem occurs daily. Associated symptoms include neck pain. Pertinent negatives include no abdominal pain, anorexia, arthralgias, change in bowel habit, chest pain, chills, congestion, coughing, diaphoresis, fatigue, fever, headaches, joint swelling, myalgias, nausea, numbness, rash, sore throat, swollen glands, urinary symptoms, vertigo, visual change, vomiting or weakness. Associated symptoms comments: Balance issue. Nothing aggravates the symptoms. The treatment provided mild relief.  Cough This is a recurrent problem. The current episode started more than 1 month ago. The problem has been gradually worsening. The problem occurs constantly. The cough is non-productive. Associated symptoms include sweats. Pertinent negatives include no chest pain, chills, ear pain, fever, headaches, heartburn, myalgias, rash, sore throat, shortness of breath, weight loss or wheezing. The symptoms are aggravated by pollens. She has tried a beta-agonist inhaler for the symptoms. The treatment provided no relief. There is no history of environmental allergies.  Thyroid Problem Presents for follow-up visit. Symptoms include tremors and weight gain. Patient reports no anxiety, cold intolerance, constipation, depressed mood, diaphoresis, diarrhea, fatigue, hair loss, heat intolerance, hoarse voice, leg swelling, nail problem, palpitations, visual change or weight loss. Past treatments include levothyroxine. The treatment provided moderate  relief. There is no history of atrial fibrillation, dementia, diabetes, Graves' ophthalmopathy, heart failure, hyperlipidemia, neuropathy, obesity or osteopenia.    No problem-specific assessment & plan notes found for this encounter.   Past Medical History  Diagnosis Date  . Dysrhythmia     Complete Heart Block  . Presence of permanent cardiac pacemaker     2012  . Hypothyroidism   . Depression   . Hyperlipidemia   . COPD (chronic obstructive pulmonary disease)   . History of hiatal hernia   . Graves disease   . Gastritis, chronic   . Hypertension   . GERD (gastroesophageal reflux disease)     Past Surgical History  Procedure Laterality Date  . Vaginal hysterectomy    . Foot surgery    . Fibroid tumors      fingers and wrist  . Tonsillectomy    . Colonoscopy  2000  . Appendectomy    . Colonoscopy with propofol N/A 04/26/2015    Procedure: COLONOSCOPY WITH PROPOFOL;  Surgeon: Hulen Luster, MD;  Location: [facility] ENDOSCOPY;  Service: Gastroenterology;  Laterality: N/A;    History reviewed. No pertinent family history.  Social History   Social History  . Marital Status: Married    Spouse Name: N/A  . Number of Children: N/A  . Years of Education: N/A   Occupational History  . Not on file.   Social History Main Topics  . Smoking status: Former Research scientist (life sciences)  . Smokeless tobacco: Never Used  . Alcohol Use: 0.0 oz/week    0 Standard drinks or equivalent per week  . Drug Use: No  . Sexual Activity: Yes   Other Topics Concern  . Not on file   Social History Narrative    Allergies  Allergen Reactions  . Penicillins  Review of Systems  Constitutional: Positive for weight gain. Negative for fever, chills, weight loss, malaise/fatigue, diaphoresis and fatigue.  HENT: Negative for congestion, ear discharge, ear pain, hoarse voice and sore throat.   Eyes: Negative for blurred vision.  Respiratory: Negative for cough, sputum production, shortness of breath and  wheezing.   Cardiovascular: Negative for chest pain, palpitations and leg swelling.  Gastrointestinal: Negative for heartburn, nausea, vomiting, abdominal pain, diarrhea, constipation, blood in stool, melena, anorexia and change in bowel habit.  Genitourinary: Negative for dysuria, urgency, frequency and hematuria.  Musculoskeletal: Positive for neck pain. Negative for myalgias, back pain, joint pain, joint swelling and arthralgias.  Skin: Negative for rash.  Neurological: Positive for tremors. Negative for dizziness, vertigo, tingling, sensory change, focal weakness, weakness, numbness and headaches.  Endo/Heme/Allergies: Negative for environmental allergies, cold intolerance, heat intolerance and polydipsia. Does not bruise/bleed easily.  Psychiatric/Behavioral: Negative for depression and suicidal ideas. The patient is not nervous/anxious and does not have insomnia.      Objective  Filed Vitals:   05/20/15 0918  BP: 140/100  Pulse: 68  Height: 5\' 5"  (1.651 m)  Weight: 188 lb (85.276 kg)    Physical Exam  Constitutional: She is well-developed, well-nourished, and in no distress. No distress.  HENT:  Head: Normocephalic and atraumatic.  Right Ear: External ear normal.  Left Ear: External ear normal.  Nose: Nose normal.  Mouth/Throat: Oropharynx is clear and moist.  Eyes: Conjunctivae and EOM are normal. Pupils are equal, round, and reactive to light. Right eye exhibits no discharge. Left eye exhibits no discharge.  Neck: Normal range of motion. Neck supple. No JVD present. No thyroid mass and no thyromegaly present.  Cardiovascular: Normal rate, regular rhythm, normal heart sounds and intact distal pulses.  Exam reveals no gallop and no friction rub.   No murmur heard. Pulmonary/Chest: Effort normal and breath sounds normal.  Abdominal: Soft. Bowel sounds are normal. She exhibits no mass. There is no tenderness. There is no guarding.  Musculoskeletal: Normal range of motion. She  exhibits no edema.  Lymphadenopathy:    She has no cervical adenopathy.  Neurological: She is alert. She has normal motor skills, normal sensation, normal strength and intact cranial nerves. She has a normal Romberg Test. Gait normal.  Reflex Scores:      Bicep reflexes are 2+ on the right side and 2+ on the left side.      Patellar reflexes are 3+ on the right side and 3+ on the left side.      Achilles reflexes are 2+ on the right side and 2+ on the left side. Skin: Skin is warm and dry. She is not diaphoretic.  Psychiatric: Mood and affect normal.  Cognitive normal      Assessment & Plan  Problem List Items Addressed This Visit      Cardiovascular and Mediastinum   Essential (primary) hypertension   Relevant Medications   losartan (COZAAR) 50 MG tablet   Other Relevant Orders   Renal Function Panel     Endocrine   Hypothyroid - Primary   Relevant Orders   TSH    Other Visit Diagnoses    Cough        Relevant Medications    losartan (COZAAR) 50 MG tablet         Dr. Mac[facility]  05/20/2015

## 2015-05-21 LAB — RENAL FUNCTION PANEL
Albumin: 4.7 g/dL (ref 3.6–4.8)
BUN/Creatinine Ratio: 25 (ref 11–26)
BUN: 19 mg/dL (ref 8–27)
CO2: 24 mmol/L (ref 18–29)
Calcium: 10.2 mg/dL (ref 8.7–10.3)
Chloride: 97 mmol/L (ref 97–108)
Creatinine, Ser: 0.76 mg/dL (ref 0.57–1.00)
GFR, EST AFRICAN AMERICAN: 95 mL/min/{1.73_m2} (ref >59)
GFR, EST NON AFRICAN AMERICAN: 82 mL/min/{1.73_m2} (ref >59)
GLUCOSE: 116 mg/dL — AB (ref 65–99)
POTASSIUM: 4.7 mmol/L (ref 3.5–5.2)
Phosphorus: 3.8 mg/dL (ref 2.5–4.5)
SODIUM: 140 mmol/L (ref 134–144)

## 2015-05-21 LAB — TSH: TSH: 0.871 u[IU]/mL (ref 0.450–4.500)

## 2015-05-25 ENCOUNTER — Other Ambulatory Visit: Payer: Self-pay | Admitting: Family Medicine

## 2015-05-25 DIAGNOSIS — E785 Hyperlipidemia, unspecified: Secondary | ICD-10-CM

## 2015-06-30 ENCOUNTER — Other Ambulatory Visit: Payer: Self-pay | Admitting: Family Medicine

## 2015-07-01 ENCOUNTER — Ambulatory Visit (INDEPENDENT_AMBULATORY_CARE_PROVIDER_SITE_OTHER): Payer: Medicare Other | Admitting: Family Medicine

## 2015-07-01 ENCOUNTER — Encounter: Payer: Self-pay | Admitting: Family Medicine

## 2015-07-01 VITALS — BP 100/80 | HR 70 | Ht 65.0 in | Wt 187.0 lb

## 2015-07-01 DIAGNOSIS — J209 Acute bronchitis, unspecified: Secondary | ICD-10-CM | POA: Diagnosis not present

## 2015-07-01 DIAGNOSIS — J4520 Mild intermittent asthma, uncomplicated: Secondary | ICD-10-CM

## 2015-07-01 MED ORDER — LEVOFLOXACIN 500 MG PO TABS: 500.0000 mg | ORAL_TABLET | Freq: Every day | ORAL | Status: DC

## 2015-07-01 MED ORDER — ALBUTEROL SULFATE HFA 108 (90 BASE) MCG/ACT IN AERS: 1.0000 | INHALATION_SPRAY | Freq: Four times a day (QID) | RESPIRATORY_TRACT | Status: DC | PRN

## 2015-07-01 MED ORDER — PREDNISONE 10 MG PO TABS: 10.0000 mg | ORAL_TABLET | Freq: Every day | ORAL | Status: DC

## 2015-07-01 MED ORDER — GUAIFENESIN-CODEINE 100-10 MG/5ML PO SOLN: 5.0000 mL | Freq: Three times a day (TID) | ORAL | Status: DC | PRN

## 2015-07-01 NOTE — Progress Notes (Signed)
Name: Danielle Taylor   MRN: [id number]    DOB: 09/10/1945   Date:07/01/2015       Progress Note  Subjective  Chief Complaint  Chief Complaint  Patient presents with  . Allergic Rhinitis     refill Loratadine  . Bronchitis    hoarse, cough    Cough This is a recurrent problem. The current episode started in the past 7 days. The problem has been waxing and waning. The problem occurs every few minutes. The cough is productive of purulent sputum. Associated symptoms include nasal congestion, postnasal drip, rhinorrhea, a sore throat and shortness of breath. Pertinent negatives include no chest pain, chills, ear congestion, ear pain, fever, headaches, heartburn, hemoptysis, myalgias, rash, sweats, weight loss or wheezing. The symptoms are aggravated by pollens and cold air. She has tried a beta-agonist inhaler (supposedly) for the symptoms. The treatment provided mild relief. Her past medical history is significant for asthma and bronchitis. There is no history of environmental allergies.    No problem-specific assessment & plan notes found for this encounter.   Past Medical History  Diagnosis Date  . Dysrhythmia     Complete Heart Block  . Presence of permanent cardiac pacemaker     2012  . Hypothyroidism   . Depression   . Hyperlipidemia   . COPD (chronic obstructive pulmonary disease) ([employer])   . History of hiatal hernia   . Graves disease   . Gastritis, chronic   . Hypertension   . GERD (gastroesophageal reflux disease)     Past Surgical History  Procedure Laterality Date  . Vaginal hysterectomy    . Foot surgery    . Fibroid tumors      fingers and wrist  . Tonsillectomy    . Colonoscopy  2000  . Appendectomy    . Colonoscopy with propofol N/A 04/26/2015    Procedure: COLONOSCOPY WITH PROPOFOL;  Surgeon: Hulen Luster, MD;  Location: [facility] ENDOSCOPY;  Service: Gastroenterology;  Laterality: N/A;    History reviewed. No pertinent family history.  Social History    Social History  . Marital Status: Married    Spouse Name: N/A  . Number of Children: N/A  . Years of Education: N/A   Occupational History  . Not on file.   Social History Main Topics  . Smoking status: Former Research scientist (life sciences)  . Smokeless tobacco: Never Used  . Alcohol Use: 0.0 oz/week    0 Standard drinks or equivalent per week  . Drug Use: No  . Sexual Activity: Yes   Other Topics Concern  . Not on file   Social History Narrative    Allergies  Allergen Reactions  . Penicillins      Review of Systems  Constitutional: Negative for fever, chills, weight loss and malaise/fatigue.  HENT: Positive for postnasal drip, rhinorrhea and sore throat. Negative for ear discharge and ear pain.   Eyes: Negative for blurred vision.  Respiratory: Positive for cough and shortness of breath. Negative for hemoptysis, sputum production and wheezing.   Cardiovascular: Negative for chest pain, palpitations and leg swelling.  Gastrointestinal: Negative for heartburn, nausea, abdominal pain, diarrhea, constipation, blood in stool and melena.  Genitourinary: Negative for dysuria, urgency, frequency and hematuria.  Musculoskeletal: Negative for myalgias, back pain, joint pain and neck pain.  Skin: Negative for rash.  Neurological: Negative for dizziness, tingling, sensory change, focal weakness and headaches.  Endo/Heme/Allergies: Negative for environmental allergies and polydipsia. Does not bruise/bleed easily.  Psychiatric/Behavioral: Negative for depression and suicidal  ideas. The patient is not nervous/anxious and does not have insomnia.      Objective  Filed Vitals:   07/01/15 1054  BP: 100/80  Pulse: 70  Height: 5\' 5"  (1.651 m)  Weight: 187 lb (84.823 kg)  SpO2: 97%    Physical Exam  Constitutional: She is well-developed, well-nourished, and in no distress. No distress.  HENT:  Head: Normocephalic and atraumatic.  Right Ear: External ear normal.  Left Ear: External ear normal.   Nose: Nose normal.  Mouth/Throat: Oropharynx is clear and moist.  Eyes: Conjunctivae and EOM are normal. Pupils are equal, round, and reactive to light. Right eye exhibits no discharge. Left eye exhibits no discharge.  Neck: Normal range of motion. Neck supple. No JVD present. No thyromegaly present.  Cardiovascular: Normal rate, regular rhythm, normal heart sounds and intact distal pulses.  Exam reveals no gallop and no friction rub.   No murmur heard. Pulmonary/Chest: Effort normal and breath sounds normal.  Abdominal: Soft. Bowel sounds are normal. She exhibits no mass. There is no tenderness. There is no guarding.  Musculoskeletal: Normal range of motion. She exhibits no edema.  Lymphadenopathy:    She has no cervical adenopathy.  Neurological: She is alert. She has normal reflexes.  Skin: Skin is warm and dry. She is not diaphoretic.  Psychiatric: Mood and affect normal.      Assessment & Plan  Problem List Items Addressed This Visit      Respiratory   Acute bronchitis - Primary   Relevant Medications   beclomethasone (QVAR) 80 MCG/ACT inhaler   levofloxacin (LEVAQUIN) 500 MG tablet   albuterol (PROVENTIL HFA;VENTOLIN HFA) 108 (90 BASE) MCG/ACT inhaler   guaiFENesin-codeine 100-10 MG/5ML syrup   Reactive airway disease   Relevant Medications   beclomethasone (QVAR) 80 MCG/ACT inhaler   levofloxacin (LEVAQUIN) 500 MG tablet   albuterol (PROVENTIL HFA;VENTOLIN HFA) 108 (90 BASE) MCG/ACT inhaler   guaiFENesin-codeine 100-10 MG/5ML syrup        Dr. Deanna Jones [facility]  07/01/2015

## 2015-07-12 DIAGNOSIS — J310 Chronic rhinitis: Secondary | ICD-10-CM | POA: Diagnosis not present

## 2015-07-12 DIAGNOSIS — J4531 Mild persistent asthma with (acute) exacerbation: Secondary | ICD-10-CM | POA: Diagnosis not present

## 2015-07-12 DIAGNOSIS — R05 Cough: Secondary | ICD-10-CM | POA: Diagnosis not present

## 2015-07-12 DIAGNOSIS — R0609 Other forms of dyspnea: Secondary | ICD-10-CM | POA: Diagnosis not present

## 2015-07-15 ENCOUNTER — Ambulatory Visit (INDEPENDENT_AMBULATORY_CARE_PROVIDER_SITE_OTHER): Payer: Medicare Other | Admitting: Family Medicine

## 2015-07-15 ENCOUNTER — Encounter: Payer: Self-pay | Admitting: Family Medicine

## 2015-07-15 VITALS — BP 124/90 | HR 80 | Ht 65.0 in | Wt 187.0 lb

## 2015-07-15 DIAGNOSIS — R05 Cough: Secondary | ICD-10-CM

## 2015-07-15 DIAGNOSIS — I10 Essential (primary) hypertension: Secondary | ICD-10-CM | POA: Diagnosis not present

## 2015-07-15 DIAGNOSIS — F339 Major depressive disorder, recurrent, unspecified: Secondary | ICD-10-CM | POA: Diagnosis not present

## 2015-07-15 DIAGNOSIS — E785 Hyperlipidemia, unspecified: Secondary | ICD-10-CM | POA: Diagnosis not present

## 2015-07-15 DIAGNOSIS — E784 Other hyperlipidemia: Secondary | ICD-10-CM | POA: Diagnosis not present

## 2015-07-15 DIAGNOSIS — E039 Hypothyroidism, unspecified: Secondary | ICD-10-CM | POA: Diagnosis not present

## 2015-07-15 DIAGNOSIS — F329 Major depressive disorder, single episode, unspecified: Secondary | ICD-10-CM

## 2015-07-15 DIAGNOSIS — J4520 Mild intermittent asthma, uncomplicated: Secondary | ICD-10-CM | POA: Diagnosis not present

## 2015-07-15 DIAGNOSIS — R059 Cough, unspecified: Secondary | ICD-10-CM

## 2015-07-15 DIAGNOSIS — Z23 Encounter for immunization: Secondary | ICD-10-CM | POA: Diagnosis not present

## 2015-07-15 DIAGNOSIS — F32A Depression, unspecified: Secondary | ICD-10-CM

## 2015-07-15 DIAGNOSIS — E7849 Other hyperlipidemia: Secondary | ICD-10-CM

## 2015-07-15 MED ORDER — LEVOTHYROXINE SODIUM 112 MCG PO TABS: ORAL_TABLET | ORAL | Status: DC

## 2015-07-15 MED ORDER — ATORVASTATIN CALCIUM 20 MG PO TABS: 20.0000 mg | ORAL_TABLET | Freq: Every day | ORAL | Status: DC

## 2015-07-15 MED ORDER — CITALOPRAM HYDROBROMIDE 40 MG PO TABS: 40.0000 mg | ORAL_TABLET | Freq: Every day | ORAL | Status: DC

## 2015-07-15 MED ORDER — LOSARTAN POTASSIUM 50 MG PO TABS: 50.0000 mg | ORAL_TABLET | Freq: Every day | ORAL | Status: DC

## 2015-07-15 MED ORDER — METOPROLOL SUCCINATE ER 50 MG PO TB24: 50.0000 mg | ORAL_TABLET | Freq: Every day | ORAL | Status: DC

## 2015-07-15 MED ORDER — LORATADINE 10 MG PO TABS: 10.0000 mg | ORAL_TABLET | Freq: Every day | ORAL | Status: DC

## 2015-07-15 MED ORDER — GEMFIBROZIL 600 MG PO TABS: 600.0000 mg | ORAL_TABLET | Freq: Every day | ORAL | Status: DC

## 2015-07-15 NOTE — Progress Notes (Signed)
Name: Danielle Taylor   MRN: [id number]    DOB: 1945/11/30   Date:07/15/2015       Progress Note  Subjective  Chief Complaint  Chief Complaint  Patient presents with  . Depression  . Hypothyroidism  . Hypertension  . Hyperlipidemia    Depression      The patient presents with depression.  This is a chronic problem.  The current episode started more than 1 year ago.   The onset quality is undetermined.   The problem occurs intermittently.  The problem has been waxing and waning since onset.  Associated symptoms include no decreased concentration, no fatigue, no hopelessness, does not have insomnia, not irritable, no decreased interest, no myalgias, no headaches, not sad and no suicidal ideas.     The symptoms are aggravated by nothing.  Past treatments include SSRIs - Selective serotonin reuptake inhibitors.  Compliance with treatment is good.  Past medical history includes hypothyroidism, thyroid problem, anxiety and depression.     Pertinent negatives include no chronic fatigue syndrome, no Alzheimer's disease and no dementia. Hypertension This is a chronic problem. The current episode started more than 1 year ago. The problem has been gradually improving since onset. Associated symptoms include anxiety. Pertinent negatives include no blurred vision, chest pain, headaches, malaise/fatigue, neck pain, orthopnea, palpitations, peripheral edema, PND or shortness of breath. Agents associated with hypertension include NSAIDs and thyroid hormones. There are no known risk factors for coronary artery disease. Past treatments include beta blockers and angiotensin blockers. The current treatment provides moderate improvement. There are no compliance problems.  Hypertensive end-organ damage includes a thyroid problem. There is no history of angina, kidney disease, CAD/MI, CVA, heart failure, left ventricular hypertrophy, PVD, renovascular disease or retinopathy. There is no history of chronic renal  disease or a hypertension causing med.  Hyperlipidemia The current episode started more than 1 year ago. The problem is controlled. Exacerbating diseases include hypothyroidism. She has no history of chronic renal disease. There are no known factors aggravating her hyperlipidemia. Pertinent negatives include no chest pain, focal weakness, myalgias or shortness of breath. Current antihyperlipidemic treatment includes statins and fibric acid derivatives. The current treatment provides significant improvement of lipids. There are no compliance problems.  There are no known risk factors for coronary artery disease.  Thyroid Problem Presents for follow-up visit. Symptoms include anxiety and heat intolerance. Patient reports no cold intolerance, constipation, depressed mood, diaphoresis, diarrhea, dry skin, fatigue, hair loss, hoarse voice, leg swelling, palpitations, weight gain or weight loss. The symptoms have been stable. Her past medical history is significant for hyperlipidemia. There is no history of atrial fibrillation, Graves' ophthalmopathy or heart failure. There are no known risk factors.  Asthma There is no cough, hoarse voice, shortness of breath, sputum production or wheezing. This is a chronic problem. The problem has been waxing and waning. Associated symptoms include sneezing. Pertinent negatives include no chest pain, ear pain, fever, headaches, heartburn, malaise/fatigue, myalgias, PND, sore throat or weight loss. Her past medical history is significant for asthma.  Sinus Problem This is a chronic problem. The current episode started more than 1 year ago. The problem has been waxing and waning since onset. There has been no fever. She is experiencing no pain. Associated symptoms include congestion and sneezing. Pertinent negatives include no chills, coughing, diaphoresis, ear pain, headaches, hoarse voice, neck pain, shortness of breath or sore throat. Treatments tried: antihistamine. The  treatment provided moderate relief.    No problem-specific assessment & plan  notes found for this encounter.   Past Medical History  Diagnosis Date  . Dysrhythmia     Complete Heart Block  . Presence of permanent cardiac pacemaker     2012  . Hypothyroidism   . Depression   . Hyperlipidemia   . COPD (chronic obstructive pulmonary disease) ([place])   . History of hiatal hernia   . Graves disease   . Gastritis, chronic   . Hypertension   . GERD (gastroesophageal reflux disease)     Past Surgical History  Procedure Laterality Date  . Vaginal hysterectomy    . Foot surgery    . Fibroid tumors      fingers and wrist  . Tonsillectomy    . Colonoscopy  2000  . Appendectomy    . Colonoscopy with propofol N/A 04/26/2015    Procedure: COLONOSCOPY WITH PROPOFOL;  Surgeon: Hulen Luster, MD;  Location: [facility] ENDOSCOPY;  Service: Gastroenterology;  Laterality: N/A;    History reviewed. No pertinent family history.  Social History   Social History  . Marital Status: Married    Spouse Name: N/A  . Number of Children: N/A  . Years of Education: N/A   Occupational History  . Not on file.   Social History Main Topics  . Smoking status: Former Research scientist (life sciences)  . Smokeless tobacco: Never Used  . Alcohol Use: 0.0 oz/week    0 Standard drinks or equivalent per week  . Drug Use: No  . Sexual Activity: Yes   Other Topics Concern  . Not on file   Social History Narrative    Allergies  Allergen Reactions  . Penicillins      Review of Systems  Constitutional: Negative for fever, chills, weight loss, weight gain, malaise/fatigue, diaphoresis and fatigue.  HENT: Positive for congestion and sneezing. Negative for ear discharge, ear pain, hoarse voice and sore throat.   Eyes: Negative for blurred vision.  Respiratory: Negative for cough, sputum production, shortness of breath and wheezing.   Cardiovascular: Negative for chest pain, palpitations, orthopnea, leg swelling and PND.   Gastrointestinal: Negative for heartburn, nausea, abdominal pain, diarrhea, constipation, blood in stool and melena.  Genitourinary: Negative for dysuria, urgency, frequency and hematuria.  Musculoskeletal: Negative for myalgias, back pain, joint pain and neck pain.  Skin: Negative for rash.  Neurological: Negative for dizziness, tingling, sensory change, focal weakness and headaches.  Endo/Heme/Allergies: Positive for heat intolerance. Negative for environmental allergies, cold intolerance and polydipsia. Does not bruise/bleed easily.  Psychiatric/Behavioral: Positive for depression. Negative for suicidal ideas and decreased concentration. The patient is nervous/anxious. The patient does not have insomnia.      Objective  Filed Vitals:   07/15/15 0913  BP: 124/90  Pulse: 80  Height: 5\' 5"  (1.651 m)  Weight: 187 lb (84.823 kg)    Physical Exam  Constitutional: She is well-developed, well-nourished, and in no distress. She is not irritable. No distress.  HENT:  Head: Normocephalic and atraumatic.  Right Ear: External ear normal.  Left Ear: External ear normal.  Nose: Nose normal.  Mouth/Throat: Oropharynx is clear and moist.  Eyes: Conjunctivae and EOM are normal. Pupils are equal, round, and reactive to light. Right eye exhibits no discharge. Left eye exhibits no discharge.  Neck: Normal range of motion. Neck supple. No JVD present. No thyromegaly present.  Cardiovascular: Normal rate, regular rhythm, normal heart sounds and intact distal pulses.  Exam reveals no gallop and no friction rub.   No murmur heard. Pulmonary/Chest: Effort normal and breath sounds  normal.  Abdominal: Soft. Bowel sounds are normal. She exhibits no mass. There is no tenderness. There is no guarding.  Musculoskeletal: Normal range of motion. She exhibits no edema.  Lymphadenopathy:    She has no cervical adenopathy.  Neurological: She is alert. She has normal reflexes.  Skin: Skin is warm and dry. She  is not diaphoretic.  Psychiatric: Mood and affect normal.      Assessment & Plan  Problem List Items Addressed This Visit      Cardiovascular and Mediastinum   Essential (primary) hypertension   Relevant Medications   atorvastatin (LIPITOR) 20 MG tablet   gemfibrozil (LOPID) 600 MG tablet   losartan (COZAAR) 50 MG tablet   metoprolol succinate (TOPROL-XL) 50 MG 24 hr tablet   Other Relevant Orders   Renal Function Panel     Respiratory   Reactive airway disease     Endocrine   Hypothyroid   Relevant Medications   levothyroxine (SYNTHROID, LEVOTHROID) 112 MCG tablet   metoprolol succinate (TOPROL-XL) 50 MG 24 hr tablet   Other Relevant Orders   TSH     Other   Familial multiple lipoprotein-type hyperlipidemia   Relevant Medications   atorvastatin (LIPITOR) 20 MG tablet   gemfibrozil (LOPID) 600 MG tablet   losartan (COZAAR) 50 MG tablet   metoprolol succinate (TOPROL-XL) 50 MG 24 hr tablet   Other Relevant Orders   Lipid Profile   Recurrent major depressive episodes (HCC)   Relevant Medications   citalopram (CELEXA) 40 MG tablet    Other Visit Diagnoses    Need for influenza vaccination    -  Primary    Relevant Orders    Flu Vaccine QUAD 36+ mos PF IM (Fluarix & Fluzone Quad PF) (Completed)    Depression        Relevant Medications    citalopram (CELEXA) 40 MG tablet    Hyperlipidemia        Relevant Medications    atorvastatin (LIPITOR) 20 MG tablet    gemfibrozil (LOPID) 600 MG tablet    losartan (COZAAR) 50 MG tablet    metoprolol succinate (TOPROL-XL) 50 MG 24 hr tablet    Cough        Relevant Medications    losartan (COZAAR) 50 MG tablet         Dr. Kimani [facility]  07/15/2015

## 2015-07-16 LAB — RENAL FUNCTION PANEL: Phosphorus: 4.1 mg/dL (ref 2.5–4.5)

## 2015-07-16 LAB — LIPID PANEL
CHOL/HDL RATIO: 2.6 ratio (ref 0.0–4.4)
Cholesterol, Total: 215 mg/dL — ABNORMAL HIGH (ref 100–199)
HDL: 82 mg/dL (ref >39)
LDL CALC: 97 mg/dL (ref 0–99)
TRIGLYCERIDES: 181 mg/dL — AB (ref 0–149)
VLDL Cholesterol Cal: 36 mg/dL (ref 5–40)

## 2015-07-16 LAB — TSH: TSH: 1.77 u[IU]/mL (ref 0.450–4.500)

## 2015-09-13 DIAGNOSIS — G44209 Tension-type headache, unspecified, not intractable: Secondary | ICD-10-CM | POA: Diagnosis not present

## 2015-09-13 DIAGNOSIS — M9901 Segmental and somatic dysfunction of cervical region: Secondary | ICD-10-CM | POA: Diagnosis not present

## 2015-09-13 DIAGNOSIS — M9904 Segmental and somatic dysfunction of sacral region: Secondary | ICD-10-CM | POA: Diagnosis not present

## 2015-09-13 DIAGNOSIS — M9903 Segmental and somatic dysfunction of lumbar region: Secondary | ICD-10-CM | POA: Diagnosis not present

## 2015-09-13 DIAGNOSIS — M9902 Segmental and somatic dysfunction of thoracic region: Secondary | ICD-10-CM | POA: Diagnosis not present

## 2015-09-13 DIAGNOSIS — M542 Cervicalgia: Secondary | ICD-10-CM | POA: Diagnosis not present

## 2015-09-16 DIAGNOSIS — M9903 Segmental and somatic dysfunction of lumbar region: Secondary | ICD-10-CM | POA: Diagnosis not present

## 2015-09-16 DIAGNOSIS — M542 Cervicalgia: Secondary | ICD-10-CM | POA: Diagnosis not present

## 2015-09-16 DIAGNOSIS — M9902 Segmental and somatic dysfunction of thoracic region: Secondary | ICD-10-CM | POA: Diagnosis not present

## 2015-09-16 DIAGNOSIS — M9904 Segmental and somatic dysfunction of sacral region: Secondary | ICD-10-CM | POA: Diagnosis not present

## 2015-09-16 DIAGNOSIS — M9901 Segmental and somatic dysfunction of cervical region: Secondary | ICD-10-CM | POA: Diagnosis not present

## 2015-09-16 DIAGNOSIS — G44209 Tension-type headache, unspecified, not intractable: Secondary | ICD-10-CM | POA: Diagnosis not present

## 2015-09-20 DIAGNOSIS — M9904 Segmental and somatic dysfunction of sacral region: Secondary | ICD-10-CM | POA: Diagnosis not present

## 2015-09-20 DIAGNOSIS — M9907 Segmental and somatic dysfunction of upper extremity: Secondary | ICD-10-CM | POA: Diagnosis not present

## 2015-09-20 DIAGNOSIS — M9901 Segmental and somatic dysfunction of cervical region: Secondary | ICD-10-CM | POA: Diagnosis not present

## 2015-09-20 DIAGNOSIS — G44209 Tension-type headache, unspecified, not intractable: Secondary | ICD-10-CM | POA: Diagnosis not present

## 2015-09-20 DIAGNOSIS — M542 Cervicalgia: Secondary | ICD-10-CM | POA: Diagnosis not present

## 2015-09-20 DIAGNOSIS — M9903 Segmental and somatic dysfunction of lumbar region: Secondary | ICD-10-CM | POA: Diagnosis not present

## 2015-09-20 DIAGNOSIS — M9902 Segmental and somatic dysfunction of thoracic region: Secondary | ICD-10-CM | POA: Diagnosis not present

## 2015-09-27 DIAGNOSIS — M9902 Segmental and somatic dysfunction of thoracic region: Secondary | ICD-10-CM | POA: Diagnosis not present

## 2015-09-27 DIAGNOSIS — M5413 Radiculopathy, cervicothoracic region: Secondary | ICD-10-CM | POA: Diagnosis not present

## 2015-10-04 DIAGNOSIS — M9901 Segmental and somatic dysfunction of cervical region: Secondary | ICD-10-CM | POA: Diagnosis not present

## 2015-10-04 DIAGNOSIS — M5413 Radiculopathy, cervicothoracic region: Secondary | ICD-10-CM | POA: Diagnosis not present

## 2015-10-04 DIAGNOSIS — M9907 Segmental and somatic dysfunction of upper extremity: Secondary | ICD-10-CM | POA: Diagnosis not present

## 2015-10-07 DIAGNOSIS — M9901 Segmental and somatic dysfunction of cervical region: Secondary | ICD-10-CM | POA: Diagnosis not present

## 2015-10-07 DIAGNOSIS — M542 Cervicalgia: Secondary | ICD-10-CM | POA: Diagnosis not present

## 2015-10-09 ENCOUNTER — Other Ambulatory Visit: Payer: Self-pay | Admitting: Family Medicine

## 2015-10-11 DIAGNOSIS — M9908 Segmental and somatic dysfunction of rib cage: Secondary | ICD-10-CM | POA: Diagnosis not present

## 2015-10-11 DIAGNOSIS — M9901 Segmental and somatic dysfunction of cervical region: Secondary | ICD-10-CM | POA: Diagnosis not present

## 2015-10-11 DIAGNOSIS — M542 Cervicalgia: Secondary | ICD-10-CM | POA: Diagnosis not present

## 2015-10-14 ENCOUNTER — Encounter: Payer: Self-pay | Admitting: Family Medicine

## 2015-10-14 ENCOUNTER — Ambulatory Visit (INDEPENDENT_AMBULATORY_CARE_PROVIDER_SITE_OTHER): Payer: Medicare Other | Admitting: Family Medicine

## 2015-10-14 VITALS — BP 112/78 | HR 76 | Temp 97.7°F | Ht 65.0 in | Wt 193.0 lb

## 2015-10-14 DIAGNOSIS — J4520 Mild intermittent asthma, uncomplicated: Secondary | ICD-10-CM | POA: Diagnosis not present

## 2015-10-14 DIAGNOSIS — J209 Acute bronchitis, unspecified: Secondary | ICD-10-CM | POA: Diagnosis not present

## 2015-10-14 MED ORDER — GUAIFENESIN-CODEINE 100-10 MG/5ML PO SYRP: 5.0000 mL | ORAL_SOLUTION | Freq: Three times a day (TID) | ORAL | Status: DC | PRN

## 2015-10-14 MED ORDER — AZITHROMYCIN 250 MG PO TABS: ORAL_TABLET | ORAL | Status: DC

## 2015-10-14 NOTE — Progress Notes (Signed)
Name: Danielle Taylor   MRN: [id number]    DOB: 25-Nov-1945   Date:10/14/2015       Progress Note  Subjective  Chief Complaint  Chief Complaint  Patient presents with  . Bronchitis  . Allergic Rhinitis     needs refill on claritin    Cough This is a new problem. The current episode started today. The problem has been gradually worsening. The cough is non-productive. Associated symptoms include headaches, myalgias, nasal congestion, postnasal drip and shortness of breath. Pertinent negatives include no chest pain, chills, ear congestion, ear pain, fever, heartburn, hemoptysis, rash, rhinorrhea, sore throat, sweats, weight loss or wheezing. The symptoms are aggravated by lying down. The treatment provided no relief. There is no history of environmental allergies.    No problem-specific assessment & plan notes found for this encounter.   Past Medical History  Diagnosis Date  . Dysrhythmia     Complete Heart Block  . Presence of permanent cardiac pacemaker     2012  . Hypothyroidism   . Depression   . Hyperlipidemia   . COPD (chronic obstructive pulmonary disease) ([place])   . History of hiatal hernia   . Graves disease   . Gastritis, chronic   . Hypertension   . GERD (gastroesophageal reflux disease)     Past Surgical History  Procedure Laterality Date  . Vaginal hysterectomy    . Foot surgery    . Fibroid tumors      fingers and wrist  . Tonsillectomy    . Colonoscopy  2000  . Appendectomy    . Colonoscopy with propofol N/A 04/26/2015    Procedure: COLONOSCOPY WITH PROPOFOL;  Surgeon: Hulen Luster, MD;  Location: [facility] ENDOSCOPY;  Service: Gastroenterology;  Laterality: N/A;    History reviewed. No pertinent family history.  Social History   Social History  . Marital Status: Married    Spouse Name: N/A  . Number of Children: N/A  . Years of Education: N/A   Occupational History  . Not on file.   Social History Main Topics  . Smoking status: Former Research scientist (life sciences)  .  Smokeless tobacco: Never Used  . Alcohol Use: 0.0 oz/week    0 Standard drinks or equivalent per week  . Drug Use: No  . Sexual Activity: Yes   Other Topics Concern  . Not on file   Social History Narrative    Allergies  Allergen Reactions  . Penicillins      Review of Systems  Constitutional: Negative for fever, chills, weight loss and malaise/fatigue.  HENT: Positive for postnasal drip. Negative for ear discharge, ear pain, rhinorrhea and sore throat.   Eyes: Negative for blurred vision.  Respiratory: Positive for cough and shortness of breath. Negative for hemoptysis, sputum production and wheezing.   Cardiovascular: Negative for chest pain, palpitations and leg swelling.  Gastrointestinal: Negative for heartburn, nausea, abdominal pain, diarrhea, constipation, blood in stool and melena.  Genitourinary: Negative for dysuria, urgency, frequency and hematuria.  Musculoskeletal: Positive for myalgias. Negative for back pain, joint pain and neck pain.  Skin: Negative for rash.  Neurological: Positive for headaches. Negative for dizziness, tingling, sensory change and focal weakness.  Endo/Heme/Allergies: Negative for environmental allergies and polydipsia. Does not bruise/bleed easily.  Psychiatric/Behavioral: Negative for depression and suicidal ideas. The patient is not nervous/anxious and does not have insomnia.      Objective  Filed Vitals:   10/14/15 1424  BP: 112/78  Pulse: 76  Temp: 97.7 F (36.5 C)  TempSrc: Oral  Height: 5\' 5"  (1.651 m)  Weight: 193 lb (87.544 kg)  SpO2: 95%    Physical Exam  Constitutional: She is well-developed, well-nourished, and in no distress. No distress.  HENT:  Head: Normocephalic and atraumatic.  Right Ear: External ear normal. A middle ear effusion is present.  Left Ear: Tympanic membrane, external ear and ear canal normal.  Nose: Nose normal.  Mouth/Throat: Oropharynx is clear and moist.  Eyes: Conjunctivae and EOM are  normal. Pupils are equal, round, and reactive to light. Right eye exhibits no discharge. Left eye exhibits no discharge.  Neck: Normal range of motion. Neck supple. No JVD present. No thyromegaly present.  Cardiovascular: Normal rate, regular rhythm, normal heart sounds and intact distal pulses.  Exam reveals no gallop and no friction rub.   No murmur heard. Pulmonary/Chest: Effort normal and breath sounds normal.  Abdominal: Soft. Bowel sounds are normal. She exhibits no mass. There is no tenderness. There is no guarding.  Musculoskeletal: Normal range of motion. She exhibits no edema.  Lymphadenopathy:    She has no cervical adenopathy.  Neurological: She is alert.  Skin: Skin is warm and dry. She is not diaphoretic.  Psychiatric: Mood and affect normal.      Assessment & Plan  Problem List Items Addressed This Visit      Respiratory   Acute bronchitis - Primary   Relevant Medications   azithromycin (ZITHROMAX) 250 MG tablet   guaiFENesin-codeine (ROBITUSSIN AC) 100-10 MG/5ML syrup   Reactive airway disease   Relevant Medications   azithromycin (ZITHROMAX) 250 MG tablet   guaiFENesin-codeine (ROBITUSSIN AC) 100-10 MG/5ML syrup        Dr. Leverne Tessler [facility]  10/14/2015

## 2015-10-16 ENCOUNTER — Ambulatory Visit (INDEPENDENT_AMBULATORY_CARE_PROVIDER_SITE_OTHER): Payer: Medicare Other | Admitting: Family Medicine

## 2015-10-16 ENCOUNTER — Ambulatory Visit
Admission: RE | Admit: 2015-10-16 | Discharge: 2015-10-16 | Disposition: A | Discharge status: Home / Self Care | Payer: Medicare Other | Source: Ambulatory Visit | Attending: Family Medicine | Admitting: Family Medicine

## 2015-10-16 ENCOUNTER — Encounter: Payer: Self-pay | Admitting: Family Medicine

## 2015-10-16 ENCOUNTER — Other Ambulatory Visit: Payer: Self-pay

## 2015-10-16 VITALS — BP 100/62 | HR 80 | Temp 98.2°F | Ht 65.0 in | Wt 193.0 lb

## 2015-10-16 DIAGNOSIS — J40 Bronchitis, not specified as acute or chronic: Secondary | ICD-10-CM | POA: Diagnosis not present

## 2015-10-16 DIAGNOSIS — J209 Acute bronchitis, unspecified: Secondary | ICD-10-CM

## 2015-10-16 DIAGNOSIS — R05 Cough: Secondary | ICD-10-CM | POA: Diagnosis not present

## 2015-10-16 DIAGNOSIS — R509 Fever, unspecified: Secondary | ICD-10-CM | POA: Diagnosis not present

## 2015-10-16 LAB — POCT INFLUENZA A/B
INFLUENZA A, POC: NEGATIVE
INFLUENZA B, POC: NEGATIVE

## 2015-10-16 MED ORDER — LEVOFLOXACIN 500 MG PO TABS: 500.0000 mg | ORAL_TABLET | Freq: Every day | ORAL | Status: DC

## 2015-10-16 NOTE — Progress Notes (Signed)
Name: Danielle Taylor   MRN: [id number]    DOB: May 24, 1946   Date:10/16/2015       Progress Note  Subjective  Chief Complaint  Chief Complaint  Patient presents with  . Bronchitis    follow up- on Zpack x 2 days - not doing better    Cough This is a new problem. The current episode started in the past 7 days. The problem has been gradually worsening. The problem occurs every few minutes. The cough is non-productive. Associated symptoms include chest pain, chills, headaches, myalgias, postnasal drip, a sore throat and shortness of breath. Pertinent negatives include no ear congestion, ear pain, fever, heartburn, hemoptysis, nasal congestion, rash, weight loss or wheezing. Treatments tried: antibiotic. The treatment provided no relief. There is no history of asthma, COPD, environmental allergies or pneumonia.    No problem-specific assessment & plan notes found for this encounter.   Past Medical History  Diagnosis Date  . Dysrhythmia     Complete Heart Block  . Presence of permanent cardiac pacemaker     2012  . Hypothyroidism   . Depression   . Hyperlipidemia   . COPD (chronic obstructive pulmonary disease) ([place])   . History of hiatal hernia   . Graves disease   . Gastritis, chronic   . Hypertension   . GERD (gastroesophageal reflux disease)     Past Surgical History  Procedure Laterality Date  . Vaginal hysterectomy    . Foot surgery    . Fibroid tumors      fingers and wrist  . Tonsillectomy    . Colonoscopy  2000  . Appendectomy    . Colonoscopy with propofol N/A 04/26/2015    Procedure: COLONOSCOPY WITH PROPOFOL;  Surgeon: Hulen Luster, MD;  Location: [facility] ENDOSCOPY;  Service: Gastroenterology;  Laterality: N/A;    History reviewed. No pertinent family history.  Social History   Social History  . Marital Status: Married    Spouse Name: N/A  . Number of Children: N/A  . Years of Education: N/A   Occupational History  . Not on file.   Social History Main  Topics  . Smoking status: Former Research scientist (life sciences)  . Smokeless tobacco: Never Used  . Alcohol Use: 0.0 oz/week    0 Standard drinks or equivalent per week  . Drug Use: No  . Sexual Activity: Yes   Other Topics Concern  . Not on file   Social History Narrative    Allergies  Allergen Reactions  . Penicillins      Review of Systems  Constitutional: Positive for chills. Negative for fever, weight loss and malaise/fatigue.  HENT: Positive for postnasal drip and sore throat. Negative for ear discharge and ear pain.   Eyes: Negative for blurred vision.  Respiratory: Positive for cough and shortness of breath. Negative for hemoptysis, sputum production and wheezing.   Cardiovascular: Positive for chest pain. Negative for palpitations and leg swelling.       "sore from coughing"  Gastrointestinal: Negative for heartburn, nausea, abdominal pain, diarrhea, constipation, blood in stool and melena.  Genitourinary: Negative for dysuria, urgency, frequency and hematuria.  Musculoskeletal: Positive for myalgias. Negative for back pain, joint pain and neck pain.  Skin: Negative for rash.  Neurological: Positive for headaches. Negative for dizziness, tingling, sensory change and focal weakness.  Endo/Heme/Allergies: Negative for environmental allergies and polydipsia. Does not bruise/bleed easily.  Psychiatric/Behavioral: Negative for depression and suicidal ideas. The patient is not nervous/anxious and does not have insomnia.  Objective  Filed Vitals:   10/16/15 1007  BP: 100/62  Pulse: 80  Temp: 98.2 F (36.8 C)  TempSrc: Oral  Height: 5\' 5"  (1.651 m)  Weight: 193 lb (87.544 kg)  SpO2: 93%    Physical Exam  Constitutional: She is well-developed, well-nourished, and in no distress. No distress.  HENT:  Head: Normocephalic and atraumatic.  Right Ear: Tympanic membrane, external ear and ear canal normal.  Left Ear: Tympanic membrane, external ear and ear canal normal.  Nose: Nose  normal.  Mouth/Throat: Oropharynx is clear and moist.  Eyes: Conjunctivae and EOM are normal. Pupils are equal, round, and reactive to light. Right eye exhibits no discharge. Left eye exhibits no discharge.  Neck: Normal range of motion. Neck supple. No JVD present. No thyromegaly present.  Cardiovascular: Normal rate, regular rhythm, normal heart sounds and intact distal pulses.  Exam reveals no gallop and no friction rub.   No murmur heard. Pulmonary/Chest: Effort normal and breath sounds normal.  Abdominal: Soft. Bowel sounds are normal. She exhibits no mass. There is no tenderness. There is no guarding.  Musculoskeletal: Normal range of motion. She exhibits no edema.  Lymphadenopathy:    She has no cervical adenopathy.  Neurological: She is alert.  Skin: Skin is warm and dry. She is not diaphoretic.  Psychiatric: Mood and affect normal.  Nursing note and vitals reviewed.     Assessment & Plan  Problem List Items Addressed This Visit      Respiratory   Acute bronchitis   Relevant Medications   levofloxacin (LEVAQUIN) 500 MG tablet    Other Visit Diagnoses    Bronchitis    -  Primary    Relevant Medications    levofloxacin (LEVAQUIN) 500 MG tablet    Other Relevant Orders    DG Chest 2 View (Completed)    POCT Influenza A/B (Completed)         Dr. [facility]  10/16/2015

## 2015-10-18 DIAGNOSIS — J4531 Mild persistent asthma with (acute) exacerbation: Secondary | ICD-10-CM | POA: Diagnosis not present

## 2015-10-21 DIAGNOSIS — M9902 Segmental and somatic dysfunction of thoracic region: Secondary | ICD-10-CM | POA: Diagnosis not present

## 2015-10-21 DIAGNOSIS — M9908 Segmental and somatic dysfunction of rib cage: Secondary | ICD-10-CM | POA: Diagnosis not present

## 2015-10-29 ENCOUNTER — Other Ambulatory Visit: Payer: Self-pay | Admitting: Family Medicine

## 2015-11-04 DIAGNOSIS — M9908 Segmental and somatic dysfunction of rib cage: Secondary | ICD-10-CM | POA: Diagnosis not present

## 2015-11-04 DIAGNOSIS — M9902 Segmental and somatic dysfunction of thoracic region: Secondary | ICD-10-CM | POA: Diagnosis not present

## 2015-11-13 DIAGNOSIS — Z45018 Encounter for adjustment and management of other part of cardiac pacemaker: Secondary | ICD-10-CM | POA: Diagnosis not present

## 2015-11-13 DIAGNOSIS — I498 Other specified cardiac arrhythmias: Secondary | ICD-10-CM | POA: Diagnosis not present

## 2015-11-13 DIAGNOSIS — Z950 Presence of cardiac pacemaker: Secondary | ICD-10-CM | POA: Diagnosis not present

## 2015-11-22 DIAGNOSIS — M9908 Segmental and somatic dysfunction of rib cage: Secondary | ICD-10-CM | POA: Diagnosis not present

## 2015-11-22 DIAGNOSIS — M9902 Segmental and somatic dysfunction of thoracic region: Secondary | ICD-10-CM | POA: Diagnosis not present

## 2015-12-13 DIAGNOSIS — M9902 Segmental and somatic dysfunction of thoracic region: Secondary | ICD-10-CM | POA: Diagnosis not present

## 2015-12-13 DIAGNOSIS — M9908 Segmental and somatic dysfunction of rib cage: Secondary | ICD-10-CM | POA: Diagnosis not present

## 2015-12-24 ENCOUNTER — Ambulatory Visit (INDEPENDENT_AMBULATORY_CARE_PROVIDER_SITE_OTHER): Payer: Medicare Other | Admitting: Family Medicine

## 2015-12-24 ENCOUNTER — Encounter: Payer: Self-pay | Admitting: Family Medicine

## 2015-12-24 VITALS — BP 120/70 | HR 86 | Temp 98.0°F | Ht 65.0 in | Wt 193.0 lb

## 2015-12-24 DIAGNOSIS — J449 Chronic obstructive pulmonary disease, unspecified: Secondary | ICD-10-CM | POA: Diagnosis not present

## 2015-12-24 DIAGNOSIS — J0100 Acute maxillary sinusitis, unspecified: Secondary | ICD-10-CM | POA: Diagnosis not present

## 2015-12-24 DIAGNOSIS — J40 Bronchitis, not specified as acute or chronic: Secondary | ICD-10-CM

## 2015-12-24 DIAGNOSIS — J4520 Mild intermittent asthma, uncomplicated: Secondary | ICD-10-CM | POA: Diagnosis not present

## 2015-12-24 DIAGNOSIS — J209 Acute bronchitis, unspecified: Secondary | ICD-10-CM

## 2015-12-24 MED ORDER — GUAIFENESIN-CODEINE 100-10 MG/5ML PO SYRP: 5.0000 mL | ORAL_SOLUTION | Freq: Three times a day (TID) | ORAL | Status: DC | PRN

## 2015-12-24 MED ORDER — LEVOFLOXACIN 500 MG PO TABS: 500.0000 mg | ORAL_TABLET | Freq: Every day | ORAL | Status: DC

## 2015-12-24 MED ORDER — ALBUTEROL SULFATE (2.5 MG/3ML) 0.083% IN NEBU: 2.5000 mg | INHALATION_SOLUTION | Freq: Four times a day (QID) | RESPIRATORY_TRACT | Status: DC | PRN

## 2015-12-24 NOTE — Progress Notes (Signed)
Name: Danielle Taylor   MRN: [id number]    DOB: 10-May-1946   Date:12/24/2015       Progress Note  Subjective  Chief Complaint  Chief Complaint  Patient presents with  . Sinusitis    thick green production    Sinusitis This is a new problem. The current episode started in the past 7 days. The problem has been gradually worsening since onset. There has been no fever. Associated symptoms include congestion, coughing, headaches, a hoarse voice, sinus pressure and sneezing. Pertinent negatives include no chills, diaphoresis, ear pain, neck pain, shortness of breath, sore throat or swollen glands. Treatments tried: inhalers. The treatment provided mild relief.    No problem-specific assessment & plan notes found for this encounter.   Past Medical History  Diagnosis Date  . Dysrhythmia     Complete Heart Block  . Presence of permanent cardiac pacemaker     2012  . Hypothyroidism   . Depression   . Hyperlipidemia   . COPD (chronic obstructive pulmonary disease) ([place])   . History of hiatal hernia   . Graves disease   . Gastritis, chronic   . Hypertension   . GERD (gastroesophageal reflux disease)     Past Surgical History  Procedure Laterality Date  . Vaginal hysterectomy    . Foot surgery    . Fibroid tumors      fingers and wrist  . Tonsillectomy    . Colonoscopy  2000  . Appendectomy    . Colonoscopy with propofol N/A 04/26/2015    Procedure: COLONOSCOPY WITH PROPOFOL;  Surgeon: Hulen Luster, MD;  Location: [facility] ENDOSCOPY;  Service: Gastroenterology;  Laterality: N/A;    History reviewed. No pertinent family history.  Social History   Social History  . Marital Status: Married    Spouse Name: N/A  . Number of Children: N/A  . Years of Education: N/A   Occupational History  . Not on file.   Social History Main Topics  . Smoking status: Former Research scientist (life sciences)  . Smokeless tobacco: Never Used  . Alcohol Use: 0.0 oz/week    0 Standard drinks or equivalent per week  .  Drug Use: No  . Sexual Activity: Yes   Other Topics Concern  . Not on file   Social History Narrative    Allergies  Allergen Reactions  . Penicillins      Review of Systems  Constitutional: Negative for fever, chills, weight loss, malaise/fatigue and diaphoresis.  HENT: Positive for congestion, hoarse voice, sinus pressure and sneezing. Negative for ear discharge, ear pain and sore throat.   Eyes: Negative for blurred vision.  Respiratory: Positive for cough. Negative for sputum production, shortness of breath and wheezing.   Cardiovascular: Negative for chest pain, palpitations and leg swelling.  Gastrointestinal: Negative for heartburn, nausea, abdominal pain, diarrhea, constipation, blood in stool and melena.  Genitourinary: Negative for dysuria, urgency, frequency and hematuria.  Musculoskeletal: Negative for myalgias, back pain, joint pain and neck pain.  Skin: Negative for rash.  Neurological: Positive for headaches. Negative for dizziness, tingling, sensory change and focal weakness.  Endo/Heme/Allergies: Negative for environmental allergies and polydipsia. Does not bruise/bleed easily.  Psychiatric/Behavioral: Negative for depression and suicidal ideas. The patient is not nervous/anxious and does not have insomnia.      Objective  Filed Vitals:   12/24/15 1350  BP: 120/70  Pulse: 86  Temp: 98 F (36.7 C)  TempSrc: Oral  Height: 5\' 5"  (1.651 m)  Weight: 193 lb (87.544 kg)  SpO2: 93%    Physical Exam  Constitutional: She is well-developed, well-nourished, and in no distress. No distress.  HENT:  Head: Normocephalic and atraumatic.  Right Ear: External ear normal.  Left Ear: External ear normal.  Nose: Nose normal.  Mouth/Throat: Oropharynx is clear and moist.  Eyes: Conjunctivae and EOM are normal. Pupils are equal, round, and reactive to light. Right eye exhibits no discharge. Left eye exhibits no discharge.  Neck: Normal range of motion. Neck supple. No  JVD present. No thyromegaly present.  Cardiovascular: Normal rate, regular rhythm, normal heart sounds and intact distal pulses.  Exam reveals no gallop and no friction rub.   No murmur heard. Pulmonary/Chest: Effort normal. No respiratory distress. She has wheezes. She has no rales.  Abdominal: Soft. Bowel sounds are normal. She exhibits no distension and no mass. There is no tenderness. There is no rebound and no guarding.  Musculoskeletal: Normal range of motion. She exhibits no edema.  Lymphadenopathy:    She has no cervical adenopathy.  Neurological: She is alert. She has normal reflexes.  Skin: Skin is warm and dry. She is not diaphoretic.  Psychiatric: Mood and affect normal.  Nursing note and vitals reviewed.     Assessment & Plan  Problem List Items Addressed This Visit      Respiratory   Acute bronchitis   Relevant Medications   guaiFENesin-codeine (ROBITUSSIN AC) 100-10 MG/5ML syrup   Reactive airway disease   Relevant Medications   guaiFENesin-codeine (ROBITUSSIN AC) 100-10 MG/5ML syrup    Other Visit Diagnoses    Chronic obstructive pulmonary disease, unspecified COPD type ([place])    -  Primary    sample bevespi    Relevant Medications    levofloxacin (LEVAQUIN) 500 MG tablet    albuterol (PROVENTIL) (2.5 MG/3ML) 0.083% nebulizer solution    guaiFENesin-codeine (ROBITUSSIN AC) 100-10 MG/5ML syrup    Bronchitis        Relevant Medications    guaiFENesin-codeine (ROBITUSSIN AC) 100-10 MG/5ML syrup    Acute maxillary sinusitis, recurrence not specified        Relevant Medications    levofloxacin (LEVAQUIN) 500 MG tablet    guaiFENesin-codeine (ROBITUSSIN AC) 100-10 MG/5ML syrup         Dr. Travin [facility]  12/24/2015

## 2016-01-10 DIAGNOSIS — M9908 Segmental and somatic dysfunction of rib cage: Secondary | ICD-10-CM | POA: Diagnosis not present

## 2016-01-10 DIAGNOSIS — M9902 Segmental and somatic dysfunction of thoracic region: Secondary | ICD-10-CM | POA: Diagnosis not present

## 2016-01-24 ENCOUNTER — Other Ambulatory Visit: Payer: Self-pay | Admitting: Family Medicine

## 2016-02-01 ENCOUNTER — Other Ambulatory Visit: Payer: Self-pay | Admitting: Family Medicine

## 2016-02-03 DIAGNOSIS — M9902 Segmental and somatic dysfunction of thoracic region: Secondary | ICD-10-CM | POA: Diagnosis not present

## 2016-02-03 DIAGNOSIS — M9908 Segmental and somatic dysfunction of rib cage: Secondary | ICD-10-CM | POA: Diagnosis not present

## 2016-02-07 DIAGNOSIS — M9902 Segmental and somatic dysfunction of thoracic region: Secondary | ICD-10-CM | POA: Diagnosis not present

## 2016-02-07 DIAGNOSIS — M9908 Segmental and somatic dysfunction of rib cage: Secondary | ICD-10-CM | POA: Diagnosis not present

## 2016-02-09 ENCOUNTER — Other Ambulatory Visit: Payer: Self-pay | Admitting: Family Medicine

## 2016-02-13 DIAGNOSIS — M9902 Segmental and somatic dysfunction of thoracic region: Secondary | ICD-10-CM | POA: Diagnosis not present

## 2016-02-13 DIAGNOSIS — M9908 Segmental and somatic dysfunction of rib cage: Secondary | ICD-10-CM | POA: Diagnosis not present

## 2016-02-17 DIAGNOSIS — J4531 Mild persistent asthma with (acute) exacerbation: Secondary | ICD-10-CM | POA: Diagnosis not present

## 2016-02-17 DIAGNOSIS — G4733 Obstructive sleep apnea (adult) (pediatric): Secondary | ICD-10-CM | POA: Diagnosis not present

## 2016-02-17 DIAGNOSIS — R05 Cough: Secondary | ICD-10-CM | POA: Diagnosis not present

## 2016-02-17 DIAGNOSIS — R0609 Other forms of dyspnea: Secondary | ICD-10-CM | POA: Diagnosis not present

## 2016-02-21 DIAGNOSIS — M9908 Segmental and somatic dysfunction of rib cage: Secondary | ICD-10-CM | POA: Diagnosis not present

## 2016-02-21 DIAGNOSIS — M9902 Segmental and somatic dysfunction of thoracic region: Secondary | ICD-10-CM | POA: Diagnosis not present

## 2016-03-09 DIAGNOSIS — M9908 Segmental and somatic dysfunction of rib cage: Secondary | ICD-10-CM | POA: Diagnosis not present

## 2016-03-09 DIAGNOSIS — M9902 Segmental and somatic dysfunction of thoracic region: Secondary | ICD-10-CM | POA: Diagnosis not present

## 2016-03-11 ENCOUNTER — Observation Stay
Admission: EM | Admit: 2016-03-11 | Discharge: 2016-03-12 | Disposition: A | Discharge status: Home / Self Care | Payer: Medicare Other | Attending: Internal Medicine | Admitting: Internal Medicine

## 2016-03-11 ENCOUNTER — Other Ambulatory Visit: Payer: Self-pay | Admitting: Family Medicine

## 2016-03-11 ENCOUNTER — Emergency Department: Payer: Medicare Other

## 2016-03-11 ENCOUNTER — Inpatient Hospital Stay
Admit: 2016-03-11 | Discharge: 2016-03-11 | Disposition: A | Discharge status: Home / Self Care | Payer: Medicare Other | Attending: Internal Medicine | Admitting: Internal Medicine

## 2016-03-11 ENCOUNTER — Encounter: Payer: Self-pay | Admitting: Emergency Medicine

## 2016-03-11 DIAGNOSIS — E0500 Thyrotoxicosis with diffuse goiter without thyrotoxic crisis or storm: Secondary | ICD-10-CM | POA: Diagnosis not present

## 2016-03-11 DIAGNOSIS — M6281 Muscle weakness (generalized): Secondary | ICD-10-CM | POA: Diagnosis not present

## 2016-03-11 DIAGNOSIS — Z880 Allergy status to penicillin: Secondary | ICD-10-CM | POA: Insufficient documentation

## 2016-03-11 DIAGNOSIS — K219 Gastro-esophageal reflux disease without esophagitis: Secondary | ICD-10-CM | POA: Insufficient documentation

## 2016-03-11 DIAGNOSIS — Z950 Presence of cardiac pacemaker: Secondary | ICD-10-CM | POA: Insufficient documentation

## 2016-03-11 DIAGNOSIS — G319 Degenerative disease of nervous system, unspecified: Secondary | ICD-10-CM | POA: Diagnosis not present

## 2016-03-11 DIAGNOSIS — R739 Hyperglycemia, unspecified: Secondary | ICD-10-CM | POA: Diagnosis not present

## 2016-03-11 DIAGNOSIS — R55 Syncope and collapse: Secondary | ICD-10-CM | POA: Diagnosis not present

## 2016-03-11 DIAGNOSIS — E669 Obesity, unspecified: Secondary | ICD-10-CM | POA: Diagnosis not present

## 2016-03-11 DIAGNOSIS — F339 Major depressive disorder, recurrent, unspecified: Secondary | ICD-10-CM | POA: Diagnosis not present

## 2016-03-11 DIAGNOSIS — R4182 Altered mental status, unspecified: Secondary | ICD-10-CM | POA: Diagnosis not present

## 2016-03-11 DIAGNOSIS — Z87891 Personal history of nicotine dependence: Secondary | ICD-10-CM | POA: Insufficient documentation

## 2016-03-11 DIAGNOSIS — J449 Chronic obstructive pulmonary disease, unspecified: Secondary | ICD-10-CM | POA: Diagnosis not present

## 2016-03-11 DIAGNOSIS — T675XXA Heat exhaustion, unspecified, initial encounter: Secondary | ICD-10-CM | POA: Diagnosis not present

## 2016-03-11 DIAGNOSIS — X58XXXA Exposure to other specified factors, initial encounter: Secondary | ICD-10-CM | POA: Diagnosis not present

## 2016-03-11 DIAGNOSIS — Z6833 Body mass index (BMI) 33.0-33.9, adult: Secondary | ICD-10-CM | POA: Diagnosis not present

## 2016-03-11 DIAGNOSIS — E782 Mixed hyperlipidemia: Secondary | ICD-10-CM | POA: Diagnosis not present

## 2016-03-11 DIAGNOSIS — I442 Atrioventricular block, complete: Secondary | ICD-10-CM | POA: Insufficient documentation

## 2016-03-11 DIAGNOSIS — E039 Hypothyroidism, unspecified: Secondary | ICD-10-CM | POA: Diagnosis not present

## 2016-03-11 DIAGNOSIS — E860 Dehydration: Secondary | ICD-10-CM | POA: Diagnosis not present

## 2016-03-11 DIAGNOSIS — I10 Essential (primary) hypertension: Secondary | ICD-10-CM

## 2016-03-11 DIAGNOSIS — Z8673 Personal history of transient ischemic attack (TIA), and cerebral infarction without residual deficits: Secondary | ICD-10-CM | POA: Insufficient documentation

## 2016-03-11 DIAGNOSIS — R29898 Other symptoms and signs involving the musculoskeletal system: Secondary | ICD-10-CM

## 2016-03-11 DIAGNOSIS — I639 Cerebral infarction, unspecified: Secondary | ICD-10-CM

## 2016-03-11 DIAGNOSIS — J4520 Mild intermittent asthma, uncomplicated: Secondary | ICD-10-CM | POA: Diagnosis not present

## 2016-03-11 DIAGNOSIS — I081 Rheumatic disorders of both mitral and tricuspid valves: Secondary | ICD-10-CM | POA: Insufficient documentation

## 2016-03-11 DIAGNOSIS — Z79899 Other long term (current) drug therapy: Secondary | ICD-10-CM | POA: Diagnosis not present

## 2016-03-11 DIAGNOSIS — K449 Diaphragmatic hernia without obstruction or gangrene: Secondary | ICD-10-CM | POA: Insufficient documentation

## 2016-03-11 DIAGNOSIS — R42 Dizziness and giddiness: Secondary | ICD-10-CM | POA: Diagnosis not present

## 2016-03-11 LAB — CBC WITH DIFFERENTIAL/PLATELET
BASOS PCT: 1 %
Basophils Absolute: 0 10*3/uL (ref 0–0.1)
EOS ABS: 0 10*3/uL (ref 0–0.7)
Eosinophils Relative: 0 %
HCT: 42.3 % (ref 35.0–47.0)
HEMOGLOBIN: 14.3 g/dL (ref 12.0–16.0)
LYMPHS ABS: 1.5 10*3/uL (ref 1.0–3.6)
Lymphocytes Relative: 22 %
MCH: 33 pg (ref 26.0–34.0)
MCHC: 33.9 g/dL (ref 32.0–36.0)
MCV: 97.3 fL (ref 80.0–100.0)
Monocytes Absolute: 0.3 10*3/uL (ref 0.2–0.9)
Monocytes Relative: 4 %
NEUTROS ABS: 5.3 10*3/uL (ref 1.4–6.5)
NEUTROS PCT: 73 %
Platelets: 245 10*3/uL (ref 150–440)
RBC: 4.35 MIL/uL (ref 3.80–5.20)
RDW: 13.8 % (ref 11.5–14.5)
WBC: 7.1 10*3/uL (ref 3.6–11.0)

## 2016-03-11 LAB — COMPREHENSIVE METABOLIC PANEL
ALBUMIN: 4.2 g/dL (ref 3.5–5.0)
ALK PHOS: 65 U/L (ref 38–126)
ALT: 21 U/L (ref 14–54)
AST: 17 U/L (ref 15–41)
Anion gap: 10 (ref 5–15)
BUN: 26 mg/dL — ABNORMAL HIGH (ref 6–20)
CALCIUM: 9.3 mg/dL (ref 8.9–10.3)
CO2: 22 mmol/L (ref 22–32)
CREATININE: 0.88 mg/dL (ref 0.44–1.00)
Chloride: 107 mmol/L (ref 101–111)
GFR calc Af Amer: >60 mL/min (ref >60)
GFR calc non Af Amer: >60 mL/min (ref >60)
GLUCOSE: 102 mg/dL — AB (ref 65–99)
Potassium: 4.4 mmol/L (ref 3.5–5.1)
SODIUM: 139 mmol/L (ref 135–145)
Total Bilirubin: 0.8 mg/dL (ref 0.3–1.2)
Total Protein: 7 g/dL (ref 6.5–8.1)

## 2016-03-11 LAB — TROPONIN I: Troponin I: <0.03 ng/mL (ref <0.03)

## 2016-03-11 LAB — VITAMIN B12: VITAMIN B 12: 253 pg/mL (ref 180–914)

## 2016-03-11 LAB — TSH: TSH: 7.703 u[IU]/mL — AB (ref 0.350–4.500)

## 2016-03-11 MED ORDER — ASPIRIN EC 325 MG PO TBEC
325.0000 mg | DELAYED_RELEASE_TABLET | Freq: Every day | ORAL | Status: DC
  Administered 2016-03-12: 325 mg via ORAL
  Filled 2016-03-11: qty 1

## 2016-03-11 MED ORDER — SODIUM CHLORIDE 0.9 % IV SOLN
INTRAVENOUS | Status: DC
  Administered 2016-03-11: 22:00:00 via INTRAVENOUS

## 2016-03-11 MED ORDER — ALBUTEROL SULFATE (2.5 MG/3ML) 0.083% IN NEBU
2.5000 mg | INHALATION_SOLUTION | Freq: Once | RESPIRATORY_TRACT | Status: AC
Start: 2016-03-11 — End: 2016-03-11
  Administered 2016-03-11: 2.5 mg via RESPIRATORY_TRACT
  Filled 2016-03-11: qty 3

## 2016-03-11 MED ORDER — ATORVASTATIN CALCIUM 20 MG PO TABS: 20.0000 mg | ORAL_TABLET | Freq: Every day | ORAL | Status: DC

## 2016-03-11 MED ORDER — BECLOMETHASONE DIPROPIONATE 80 MCG/ACT IN AERS: 1.0000 | INHALATION_SPRAY | Freq: Two times a day (BID) | RESPIRATORY_TRACT | Status: DC

## 2016-03-11 MED ORDER — LORATADINE 10 MG PO TABS
10.0000 mg | ORAL_TABLET | Freq: Every day | ORAL | Status: DC
  Administered 2016-03-12: 10 mg via ORAL
  Filled 2016-03-11: qty 1

## 2016-03-11 MED ORDER — OMEGA-3-ACID ETHYL ESTERS 1 G PO CAPS
1.0000 g | ORAL_CAPSULE | Freq: Every day | ORAL | Status: DC
  Administered 2016-03-12: 1 g via ORAL
  Filled 2016-03-11: qty 1

## 2016-03-11 MED ORDER — CITALOPRAM HYDROBROMIDE 20 MG PO TABS
40.0000 mg | ORAL_TABLET | Freq: Every day | ORAL | Status: DC
  Administered 2016-03-11 – 2016-03-12 (×2): 40 mg via ORAL
  Filled 2016-03-11 (×3): qty 2

## 2016-03-11 MED ORDER — ONDANSETRON HCL 4 MG/2ML IJ SOLN
4.0000 mg | Freq: Four times a day (QID) | INTRAMUSCULAR | Status: DC | PRN
  Administered 2016-03-12: 4 mg via INTRAVENOUS
  Filled 2016-03-11: qty 2

## 2016-03-11 MED ORDER — SODIUM CHLORIDE 0.9 % IV SOLN: 250.0000 mL | INTRAVENOUS | Status: DC | PRN

## 2016-03-11 MED ORDER — BUDESONIDE 0.25 MG/2ML IN SUSP
0.2500 mg | Freq: Two times a day (BID) | RESPIRATORY_TRACT | Status: DC
  Administered 2016-03-12: 0.25 mg via RESPIRATORY_TRACT
  Filled 2016-03-11: qty 2

## 2016-03-11 MED ORDER — SODIUM CHLORIDE 0.9% FLUSH: 3.0000 mL | INTRAVENOUS | Status: DC | PRN

## 2016-03-11 MED ORDER — GUAIFENESIN-CODEINE 100-10 MG/5ML PO SOLN: 5.0000 mL | Freq: Three times a day (TID) | ORAL | Status: DC | PRN

## 2016-03-11 MED ORDER — METOPROLOL SUCCINATE ER 50 MG PO TB24
50.0000 mg | ORAL_TABLET | Freq: Every day | ORAL | Status: DC
Start: 2016-03-12 — End: 2016-03-12
  Administered 2016-03-12: 50 mg via ORAL
  Filled 2016-03-11: qty 1

## 2016-03-11 MED ORDER — SIMVASTATIN 20 MG PO TABS: 20.0000 mg | ORAL_TABLET | Freq: Every day | ORAL | Status: DC

## 2016-03-11 MED ORDER — ALBUTEROL SULFATE (2.5 MG/3ML) 0.083% IN NEBU: 2.5000 mg | INHALATION_SOLUTION | Freq: Four times a day (QID) | RESPIRATORY_TRACT | Status: DC | PRN

## 2016-03-11 MED ORDER — SODIUM CHLORIDE 0.9 % IV SOLN
1000.0000 mL | Freq: Once | INTRAVENOUS | Status: AC
  Administered 2016-03-11: 1000 mL via INTRAVENOUS

## 2016-03-11 MED ORDER — GEMFIBROZIL 600 MG PO TABS
600.0000 mg | ORAL_TABLET | Freq: Every day | ORAL | Status: DC
  Administered 2016-03-12: 600 mg via ORAL
  Filled 2016-03-11: qty 1

## 2016-03-11 MED ORDER — LOSARTAN POTASSIUM 50 MG PO TABS
50.0000 mg | ORAL_TABLET | Freq: Every day | ORAL | Status: DC
  Administered 2016-03-12: 50 mg via ORAL
  Filled 2016-03-11: qty 1

## 2016-03-11 MED ORDER — ACETAMINOPHEN 650 MG RE SUPP: 650.0000 mg | Freq: Four times a day (QID) | RECTAL | Status: DC | PRN

## 2016-03-11 MED ORDER — ALBUTEROL SULFATE HFA 108 (90 BASE) MCG/ACT IN AERS: 1.0000 | INHALATION_SPRAY | Freq: Four times a day (QID) | RESPIRATORY_TRACT | Status: DC | PRN

## 2016-03-11 MED ORDER — ACETAMINOPHEN 325 MG PO TABS
650.0000 mg | ORAL_TABLET | Freq: Four times a day (QID) | ORAL | Status: DC | PRN
  Administered 2016-03-11 – 2016-03-12 (×2): 650 mg via ORAL
  Filled 2016-03-11 (×2): qty 2

## 2016-03-11 MED ORDER — LEVOTHYROXINE SODIUM 112 MCG PO TABS
112.0000 ug | ORAL_TABLET | Freq: Every day | ORAL | Status: DC
  Administered 2016-03-12: 112 ug via ORAL
  Filled 2016-03-11: qty 1

## 2016-03-11 MED ORDER — ONDANSETRON HCL 4 MG PO TABS: 4.0000 mg | ORAL_TABLET | Freq: Four times a day (QID) | ORAL | Status: DC | PRN

## 2016-03-11 MED ORDER — ASPIRIN 81 MG PO CHEW
324.0000 mg | CHEWABLE_TABLET | Freq: Once | ORAL | Status: AC
  Administered 2016-03-11: 324 mg via ORAL
  Filled 2016-03-11: qty 4

## 2016-03-11 MED ORDER — SODIUM CHLORIDE 0.9% FLUSH
3.0000 mL | Freq: Two times a day (BID) | INTRAVENOUS | Status: DC
  Administered 2016-03-11: 3 mL via INTRAVENOUS

## 2016-03-11 MED ORDER — ENOXAPARIN SODIUM 40 MG/0.4ML ~~LOC~~ SOLN
40.0000 mg | SUBCUTANEOUS | Status: DC
Start: 2016-03-11 — End: 2016-03-12
  Administered 2016-03-11: 40 mg via SUBCUTANEOUS
  Filled 2016-03-11: qty 0.4

## 2016-03-11 NOTE — ED Notes (Signed)
OJ, peanut butter, and crackers given to patient. No [place] trays available at this time. Dietary called and notified.
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
## 2016-03-11 NOTE — ED Notes (Signed)
Patient sating 91%, placed on 2L [place]. Up to 96%.

## 2016-03-12 ENCOUNTER — Encounter: Payer: Self-pay | Admitting: Nurse Practitioner

## 2016-03-12 DIAGNOSIS — R739 Hyperglycemia, unspecified: Secondary | ICD-10-CM | POA: Diagnosis not present

## 2016-03-12 DIAGNOSIS — I10 Essential (primary) hypertension: Secondary | ICD-10-CM | POA: Diagnosis not present

## 2016-03-12 DIAGNOSIS — R55 Syncope and collapse: Secondary | ICD-10-CM | POA: Diagnosis not present

## 2016-03-12 DIAGNOSIS — R29898 Other symptoms and signs involving the musculoskeletal system: Secondary | ICD-10-CM | POA: Diagnosis not present

## 2016-03-12 DIAGNOSIS — E860 Dehydration: Secondary | ICD-10-CM | POA: Diagnosis not present

## 2016-03-12 LAB — BASIC METABOLIC PANEL
ANION GAP: 7 (ref 5–15)
BUN: 25 mg/dL — ABNORMAL HIGH (ref 6–20)
CHLORIDE: 106 mmol/L (ref 101–111)
CO2: 28 mmol/L (ref 22–32)
CREATININE: 0.77 mg/dL (ref 0.44–1.00)
Calcium: 8.9 mg/dL (ref 8.9–10.3)
GFR calc non Af Amer: >60 mL/min (ref >60)
Glucose, Bld: 94 mg/dL (ref 65–99)
Potassium: 4.4 mmol/L (ref 3.5–5.1)
SODIUM: 141 mmol/L (ref 135–145)

## 2016-03-12 LAB — CBC
HCT: 40.4 % (ref 35.0–47.0)
HEMOGLOBIN: 13.9 g/dL (ref 12.0–16.0)
MCH: 33.7 pg (ref 26.0–34.0)
MCHC: 34.4 g/dL (ref 32.0–36.0)
MCV: 98.1 fL (ref 80.0–100.0)
PLATELETS: 216 10*3/uL (ref 150–440)
RBC: 4.12 MIL/uL (ref 3.80–5.20)
RDW: 14.2 % (ref 11.5–14.5)
WBC: 6.6 10*3/uL (ref 3.6–11.0)

## 2016-03-12 LAB — URINALYSIS COMPLETE WITH MICROSCOPIC (ARMC ONLY)
Bacteria, UA: NONE SEEN
Bilirubin Urine: NEGATIVE
Glucose, UA: NEGATIVE mg/dL
HGB URINE DIPSTICK: NEGATIVE
KETONES UR: NEGATIVE mg/dL
Nitrite: NEGATIVE
PH: 5 (ref 5.0–8.0)
PROTEIN: NEGATIVE mg/dL
Specific Gravity, Urine: 1.024 (ref 1.005–1.030)

## 2016-03-12 LAB — TROPONIN I: Troponin I: <0.03 ng/mL (ref <0.03)

## 2016-03-12 LAB — HEMOGLOBIN A1C: Hgb A1c MFr Bld: 6.3 % — ABNORMAL HIGH (ref 4.0–6.0)

## 2016-03-12 LAB — ECHOCARDIOGRAM COMPLETE
Height: 64.5 in
WEIGHTICAEL: 3185.6 [oz_av]

## 2016-03-12 MED ORDER — AMLODIPINE BESYLATE 5 MG PO TABS
10.0000 mg | ORAL_TABLET | Freq: Every day | ORAL | Status: DC
  Administered 2016-03-12: 10 mg via ORAL
  Filled 2016-03-12: qty 2

## 2016-03-12 NOTE — Discharge Summary (Signed)
[facility] NAME: Danielle Taylor    MR#:  [id number]  DATE OF BIRTH:  [birth year]  DATE OF ADMISSION:  03/11/2016 ADMITTING PHYSICIAN: Theodoro Grist, MD  DATE OF DISCHARGE: 03/12/2016 PRIMARY CARE PHYSICIAN: Otilio Miu, MD    ADMISSION DIAGNOSIS:  Near syncope [R55] Heat exhaustion, initial encounter [T67.5XXA] Reactive airway disease, mild intermittent, uncomplicated Q000111Q Cerebral infarction due to unspecified mechanism [I63.9]  DISCHARGE DIAGNOSIS:  Active Problems:   Syncope and collapse   Right leg weakness   Essential hypertension   Hyperglycemia Dehydration   SECONDARY DIAGNOSIS:   Past Medical History  Diagnosis Date  . Dysrhythmia     Complete Heart Block  . Presence of permanent cardiac pacemaker     2012  . Hypothyroidism   . Depression   . Hyperlipidemia   . COPD (chronic obstructive pulmonary disease) ([place])   . History of hiatal hernia   . Graves disease   . Gastritis, chronic   . Hypertension   . GERD (gastroesophageal reflux disease)     HOSPITAL COURSE:   #1. Syncope, possible due to dehydration from heat. Normal echocardiogram, unable to get MRI of the brain since patient has PPM. #2. Right lower extremity weakness,  Unable to get MRI of the brain, continue aspirin therapy, continue statin, negative head CT.  #3. Essential hypertension, continue outpatient medications.  #4. Dehydration due to heat. Encourage fluid intake and avoid outside heat.  DISCHARGE CONDITIONS:   Stable, discharge to home today.  CONSULTS OBTAINED:     DRUG ALLERGIES:   Allergies  Allergen Reactions  . Penicillins Other (See Comments)    Has patient had a PCN reaction causing immediate rash, facial/tongue/throat swelling, SOB or lightheadedness with hypotension: No Has patient had a PCN reaction causing severe rash involving mucus membranes or skin necrosis: No Has patient had a PCN reaction  that required hospitalization No Has patient had a PCN reaction occurring within the last 10 years: No If all of the above answers are "NO", then may proceed with Cephalosporin use.     DISCHARGE MEDICATIONS:   Current Discharge Medication List    CONTINUE these medications which have NOT CHANGED   Details  albuterol (PROVENTIL HFA;VENTOLIN HFA) 108 (90 BASE) MCG/ACT inhaler Inhale 1-2 puffs into the lungs every 6 (six) hours as needed for wheezing or shortness of breath. Qty: 1 Inhaler, Refills: 11   Associated Diagnoses: Acute bronchitis, unspecified organism; Reactive airway disease, mild intermittent, uncomplicated    albuterol (PROVENTIL) (2.5 MG/3ML) 0.083% nebulizer solution Take 3 mLs (2.5 mg total) by nebulization every 6 (six) hours as needed for wheezing or shortness of breath. Qty: 75 mL, Refills: 12   Associated Diagnoses: Chronic obstructive pulmonary disease, unspecified COPD type (HCC)    Aspirin-Acetaminophen-Caffeine (EXCEDRIN PO) Take 1 tablet by mouth daily.    atorvastatin (LIPITOR) 20 MG tablet TAKE 1 TABLET (20 MG TOTAL) BY MOUTH DAILY. (CALL AND SCHEDULE APPT) Qty: 30 tablet, Refills: 0    beclomethasone (QVAR) 80 MCG/ACT inhaler Inhale 1 puff into the lungs 2 (two) times daily.   Associated Diagnoses: Acute bronchitis, unspecified organism; Reactive airway disease, mild intermittent, uncomplicated    citalopram (CELEXA) 40 MG tablet TAKE 1 TABLET (40 MG TOTAL) BY MOUTH DAILY. Qty: 30 tablet, Refills: 0    gemfibrozil (LOPID) 600 MG tablet Take 1 tablet (600 mg total) by mouth daily. Qty: 90 tablet, Refills: 1   Associated Diagnoses: Hyperlipidemia; Familial multiple lipoprotein-type  hyperlipidemia    guaiFENesin-codeine (ROBITUSSIN AC) 100-10 MG/5ML syrup Take 5 mLs by mouth 3 (three) times daily as needed for cough. Qty: 150 mL, Refills: 0   Associated Diagnoses: Acute bronchitis, unspecified organism; Reactive airway disease, mild intermittent,  uncomplicated; Bronchitis    levothyroxine (SYNTHROID, LEVOTHROID) 112 MCG tablet TAKE 1 TABLET BY MOUTH DAILY *NEEDS APPT FOR MEDS** Qty: 90 tablet, Refills: 1   Associated Diagnoses: Hypothyroidism, unspecified hypothyroidism type    loratadine (CLARITIN) 10 MG tablet Take 1 tablet (10 mg total) by mouth daily. Qty: 30 tablet, Refills: 11    losartan (COZAAR) 50 MG tablet Take 1 tablet (50 mg total) by mouth daily. Qty: 90 tablet, Refills: 1   Associated Diagnoses: Essential (primary) hypertension; Cough    metoprolol succinate (TOPROL-XL) 50 MG 24 hr tablet Take 1 tablet (50 mg total) by mouth daily. Qty: 90 tablet, Refills: 1   Associated Diagnoses: Essential (primary) hypertension    Multiple Vitamins-Minerals (CENTRUM SILVER PO) Take 1 tablet by mouth daily.    Omega-3 Fatty Acids (FISH OIL) 1000 MG CAPS Take 1 capsule by mouth daily.    predniSONE (DELTASONE) 10 MG tablet Take 10 mg by mouth daily with breakfast.         DISCHARGE INSTRUCTIONS:    DIET:  Heart healthy diet.  DISCHARGE CONDITION:  Stable.  ACTIVITY:  As tolerated.  DISCHARGE LOCATION:     If you experience worsening of your admission symptoms, develop shortness of breath, life threatening emergency, suicidal or homicidal thoughts you must seek medical attention immediately by calling 911 or calling your MD immediately  if symptoms less severe.  You Must read complete instructions/literature along with all the possible adverse reactions/side effects for all the Medicines you take and that have been prescribed to you. Take any new Medicines after you have completely understood and accpet all the possible adverse reactions/side effects.   Please note  You were cared for by a hospitalist during your hospital stay. If you have any questions about your discharge medications or the care you received while you were in the hospital after you are discharged, you can call the unit and asked to speak with  the hospitalist on call if the hospitalist that took care of you is not available. Once you are discharged, your primary care physician will handle any further medical issues. Please note that NO REFILLS for any discharge medications will be authorized once you are discharged, as it is imperative that you return to your primary care physician (or establish a relationship with a primary care physician if you do not have one) for your aftercare needs so that they can reassess your need for medications and monitor your lab values.    On the day of Discharge:  VITAL SIGNS:  Blood pressure 160/69, pulse 62, temperature 97.9 F (36.6 C), temperature source Oral, resp. rate 16, height 5' 4.5" (1.638 m), weight 199 lb 1.6 oz (90.311 kg), SpO2 93 %.  PHYSICAL EXAMINATION:  GENERAL:  70 y.o.-year-old patient lying in the bed with no acute distress.  EYES: Pupils equal, round, reactive to light and accommodation. No scleral icterus. Extraocular muscles intact.  HEENT: Head atraumatic, normocephalic. Oropharynx and nasopharynx clear.  NECK:  Supple, no jugular venous distention. No thyroid enlargement, no tenderness.  LUNGS: Normal breath sounds bilaterally, no wheezing, rales,rhonchi or crepitation. No use of accessory muscles of respiration.  CARDIOVASCULAR: S1, S2 normal. No murmurs, rubs, or gallops.  ABDOMEN: Soft, non-tender, non-distended. Bowel sounds present. No  organomegaly or mass.  EXTREMITIES: No pedal edema, cyanosis, or clubbing.  NEUROLOGIC: Cranial nerves II through XII are intact. Muscle strength 5/5 in all extremities. Sensation intact. Gait not checked.  PSYCHIATRIC: The patient is alert and oriented x 3.  SKIN: No obvious rash, lesion, or ulcer.  DATA REVIEW:   CBC  Recent Labs Lab 03/12/16 0656  WBC 6.6  HGB 13.9  HCT 40.4  PLT 216    Chemistries   Recent Labs Lab 03/11/16 1428 03/12/16 0656  NA 139 141  K 4.4 4.4  CL 107 106  CO2 22 28  GLUCOSE 102* 94  BUN 26*  25*  CREATININE 0.88 0.77  CALCIUM 9.3 8.9  AST 17  --   ALT 21  --   ALKPHOS 65  --   BILITOT 0.8  --     Cardiac Enzymes  Recent Labs Lab 03/12/16 0656  TROPONINI <0.03    Microbiology Results  No results found for this or any previous visit.  RADIOLOGY:  Ct Head Wo Contrast  03/11/2016  CLINICAL DATA:  Altered mental status, near syncope EXAM: CT HEAD WITHOUT CONTRAST TECHNIQUE: Contiguous axial images were obtained from the base of the skull through the vertex without intravenous contrast. COMPARISON:  None available FINDINGS: Brain: Mild diffuse brain atrophy pattern. Small remote left frontal infarct, image 16 with encephalomalacia. No acute intracranial hemorrhage, mass lesion, acute infarction, midline shift, herniation, hydrocephalus, or extra-axial fluid collection. Normal gray-white matter differentiation. Cisterns are patent. No cerebellar abnormality. Vascular: No hyperdense vessel or unexpected calcification. Skull: Negative for fracture or focal lesion. Sinuses/Orbits: No acute findings. Other: None. IMPRESSION: Atrophy and small remote left anterior frontal infarct. No acute process by noncontrast CT. Electronically Signed   By: Jerilynn Mages.  Shick M.D.   On: 03/11/2016 15:40     Management plans discussed with the patient, her husband and they are in agreement.  CODE STATUS:     Code Status Orders        Start     Ordered   03/11/16 1851  Full code   Continuous     03/11/16 1850    Code Status History    Date Active Date Inactive Code Status Order ID Comments User Context   This patient has a current code status but no historical code status.      TOTAL TIME TAKING CARE OF THIS PATIENT: 33  minutes.    Demetrios Loll M.D on 03/12/2016 at 12:49 PM  Between 7am to 6pm - Pager - [phone]  After 6pm go to [email or web address] - password [email or web address] [facility]  Office  [phone]  CC: Primary care physician; Otilio Miu, MD   Note: This  dictation was prepared with Dragon dictation along with smaller phrase technology. Any transcriptional errors that result from this process are unintentional.

## 2016-03-12 NOTE — Discharge Instructions (Signed)
Heart healthy diet. °Activity as tolerated. °

## 2016-03-12 NOTE — Care Management Obs Status (Signed)
[facility] NOTIFICATION   Patient Details  Name: Danielle Taylor MRN: [id number] Date of Birth: [birth year]   Medicare Observation Status Notification Given:  Yes    Jolly Mango, RN 03/12/2016, 12:02 PM

## 2016-03-12 NOTE — Progress Notes (Addendum)
Patient discharged to home as ordered. Patient states that she will make her follow up appointment wit Dr. Ronnald Ramp when the office opens back up today. Patient is alert and oriented, ambulatory, no acute distress noted. Vital signs stable

## 2016-03-16 DIAGNOSIS — M9902 Segmental and somatic dysfunction of thoracic region: Secondary | ICD-10-CM | POA: Diagnosis not present

## 2016-03-16 DIAGNOSIS — M9908 Segmental and somatic dysfunction of rib cage: Secondary | ICD-10-CM | POA: Diagnosis not present

## 2016-03-20 DIAGNOSIS — M9902 Segmental and somatic dysfunction of thoracic region: Secondary | ICD-10-CM | POA: Diagnosis not present

## 2016-03-20 DIAGNOSIS — M9908 Segmental and somatic dysfunction of rib cage: Secondary | ICD-10-CM | POA: Diagnosis not present

## 2016-03-23 ENCOUNTER — Other Ambulatory Visit: Payer: Self-pay | Admitting: Family Medicine

## 2016-03-23 DIAGNOSIS — E039 Hypothyroidism, unspecified: Secondary | ICD-10-CM

## 2016-03-23 DIAGNOSIS — G479 Sleep disorder, unspecified: Secondary | ICD-10-CM | POA: Diagnosis not present

## 2016-03-23 DIAGNOSIS — J4530 Mild persistent asthma, uncomplicated: Secondary | ICD-10-CM | POA: Diagnosis not present

## 2016-03-23 DIAGNOSIS — R05 Cough: Secondary | ICD-10-CM | POA: Diagnosis not present

## 2016-03-26 ENCOUNTER — Ambulatory Visit (INDEPENDENT_AMBULATORY_CARE_PROVIDER_SITE_OTHER): Payer: Medicare Other | Admitting: Family Medicine

## 2016-03-26 ENCOUNTER — Encounter: Payer: Self-pay | Admitting: Family Medicine

## 2016-03-26 VITALS — BP 120/78 | HR 68 | Ht 64.0 in | Wt 193.0 lb

## 2016-03-26 DIAGNOSIS — E860 Dehydration: Secondary | ICD-10-CM

## 2016-03-26 DIAGNOSIS — Z09 Encounter for follow-up examination after completed treatment for conditions other than malignant neoplasm: Secondary | ICD-10-CM

## 2016-03-26 NOTE — Patient Instructions (Signed)

## 2016-03-26 NOTE — Progress Notes (Signed)
Name: Danielle Taylor   MRN: [id number]    DOB: 1946-06-05   Date:03/26/2016       Progress Note  Subjective  Chief Complaint  Chief Complaint  Patient presents with  . Follow-up    hospital visit- d/c from hosp on 03/12/2016 for dehydration and low O2- is to see cardiologist on Monday for eval of stroke that showed on CT scan. Needs repeat on renal function to recheck for dehydration    Patient followup for recent hospitalization.    No problem-specific Assessment & Plan notes found for this encounter.   Past Medical History:  Diagnosis Date  . COPD (chronic obstructive pulmonary disease) ([place])   . Depression   . Dysrhythmia    Complete Heart Block  . Gastritis, chronic   . GERD (gastroesophageal reflux disease)   . Graves disease   . History of hiatal hernia   . Hyperlipidemia   . Hypertension   . Hypothyroidism   . Presence of permanent cardiac pacemaker    2012    Past Surgical History:  Procedure Laterality Date  . APPENDECTOMY    . COLONOSCOPY  2000  . COLONOSCOPY WITH PROPOFOL N/A 04/26/2015   Procedure: COLONOSCOPY WITH PROPOFOL;  Surgeon: Hulen Luster, MD;  Location[facility] ENDOSCOPY;  Service: Gastroenterology;  Laterality: N/A;  . fibroid tumors     fingers and wrist  . FOOT SURGERY    . TONSILLECTOMY    . VAGINAL HYSTERECTOMY      History reviewed. No pertinent family history.  Social History   Social History  . Marital status: Married    Spouse name: N/A  . Number of children: N/A  . Years of education: N/A   Occupational History  . Not on file.   Social History Main Topics  . Smoking status: Former Research scientist (life sciences)  . Smokeless tobacco: Never Used  . Alcohol use 0.0 oz/week  . Drug use: No  . Sexual activity: Yes   Other Topics Concern  . Not on file   Social History Narrative  . No narrative on file    Allergies  Allergen Reactions  . Penicillins Other (See Comments)    Has patient had a PCN reaction causing immediate rash,  facial/tongue/throat swelling, SOB or lightheadedness with hypotension: No Has patient had a PCN reaction causing severe rash involving mucus membranes or skin necrosis: No Has patient had a PCN reaction that required hospitalization No Has patient had a PCN reaction occurring within the last 10 years: No If all of the above answers are "NO", then may proceed with Cephalosporin use.      Review of Systems  Constitutional: Negative for chills, fever, malaise/fatigue and weight loss.  HENT: Negative for ear discharge, ear pain and sore throat.   Eyes: Negative for blurred vision.  Respiratory: Negative for cough, sputum production, shortness of breath and wheezing.   Cardiovascular: Negative for chest pain, palpitations and leg swelling.  Gastrointestinal: Negative for abdominal pain, blood in stool, constipation, diarrhea, heartburn, melena and nausea.  Genitourinary: Negative for dysuria, frequency, hematuria and urgency.  Musculoskeletal: Negative for back pain, joint pain, myalgias and neck pain.  Skin: Negative for rash.  Neurological: Negative for dizziness, tingling, sensory change, focal weakness and headaches.  Endo/Heme/Allergies: Negative for environmental allergies and polydipsia. Does not bruise/bleed easily.  Psychiatric/Behavioral: Negative for depression and suicidal ideas. The patient is not nervous/anxious and does not have insomnia.      Objective  Vitals:   03/26/16 1034  BP: 120/78  Pulse: 68  Weight: 193 lb (87.5 kg)  Height: 5\' 4"  (1.626 m)    Physical Exam  Constitutional: She is well-developed, well-nourished, and in no distress. No distress.  HENT:  Head: Normocephalic and atraumatic.  Right Ear: External ear normal.  Left Ear: External ear normal.  Nose: Nose normal.  Mouth/Throat: Oropharynx is clear and moist.  Eyes: Conjunctivae and EOM are normal. Pupils are equal, round, and reactive to light. Right eye exhibits no discharge. Left eye exhibits  no discharge.  Neck: Normal range of motion. Neck supple. No JVD present. No thyromegaly present.  Cardiovascular: Normal rate, regular rhythm, normal heart sounds and intact distal pulses.  Exam reveals no gallop and no friction rub.   No murmur heard. Pulmonary/Chest: Effort normal and breath sounds normal.  Abdominal: Soft. Bowel sounds are normal. She exhibits no mass. There is no tenderness. There is no guarding.  Musculoskeletal: Normal range of motion. She exhibits no edema.  Lymphadenopathy:    She has no cervical adenopathy.  Neurological: She is alert. She has normal reflexes. No cranial nerve deficit.  Skin: Skin is warm and dry. She is not diaphoretic.  Psychiatric: Mood and affect normal.  Nursing note and vitals reviewed.     Assessment & Plan  Problem List Items Addressed This Visit    None    Visit Diagnoses    Hospital discharge follow-up    -  Primary   Dehydration, mild            Dr. Otilio Miu [facility]  03/26/16

## 2016-03-30 DIAGNOSIS — I480 Paroxysmal atrial fibrillation: Secondary | ICD-10-CM | POA: Diagnosis not present

## 2016-03-30 DIAGNOSIS — I69311 Memory deficit following cerebral infarction: Secondary | ICD-10-CM | POA: Diagnosis not present

## 2016-03-30 DIAGNOSIS — Z79899 Other long term (current) drug therapy: Secondary | ICD-10-CM | POA: Diagnosis not present

## 2016-03-30 DIAGNOSIS — Z7901 Long term (current) use of anticoagulants: Secondary | ICD-10-CM | POA: Diagnosis not present

## 2016-03-30 DIAGNOSIS — R55 Syncope and collapse: Secondary | ICD-10-CM | POA: Diagnosis not present

## 2016-03-30 DIAGNOSIS — Z45018 Encounter for adjustment and management of other part of cardiac pacemaker: Secondary | ICD-10-CM | POA: Diagnosis not present

## 2016-03-30 DIAGNOSIS — Z7982 Long term (current) use of aspirin: Secondary | ICD-10-CM | POA: Diagnosis not present

## 2016-04-03 DIAGNOSIS — M9908 Segmental and somatic dysfunction of rib cage: Secondary | ICD-10-CM | POA: Diagnosis not present

## 2016-04-03 DIAGNOSIS — M9902 Segmental and somatic dysfunction of thoracic region: Secondary | ICD-10-CM | POA: Diagnosis not present

## 2016-04-07 ENCOUNTER — Other Ambulatory Visit: Payer: Self-pay | Admitting: Family Medicine

## 2016-04-07 DIAGNOSIS — I10 Essential (primary) hypertension: Secondary | ICD-10-CM

## 2016-04-09 ENCOUNTER — Other Ambulatory Visit: Payer: Self-pay | Admitting: Family Medicine

## 2016-04-17 DIAGNOSIS — M9902 Segmental and somatic dysfunction of thoracic region: Secondary | ICD-10-CM | POA: Diagnosis not present

## 2016-04-17 DIAGNOSIS — M9908 Segmental and somatic dysfunction of rib cage: Secondary | ICD-10-CM | POA: Diagnosis not present

## 2016-04-19 ENCOUNTER — Other Ambulatory Visit: Payer: Self-pay | Admitting: Family Medicine

## 2016-04-21 ENCOUNTER — Ambulatory Visit: Payer: Self-pay | Admitting: Family Medicine

## 2016-04-22 ENCOUNTER — Encounter: Payer: Self-pay | Admitting: Family Medicine

## 2016-04-22 ENCOUNTER — Ambulatory Visit (INDEPENDENT_AMBULATORY_CARE_PROVIDER_SITE_OTHER): Payer: Medicare Other | Admitting: Family Medicine

## 2016-04-22 VITALS — BP 120/98 | HR 64 | Temp 97.9°F | Ht 64.0 in | Wt 194.0 lb

## 2016-04-22 DIAGNOSIS — J4520 Mild intermittent asthma, uncomplicated: Secondary | ICD-10-CM | POA: Diagnosis not present

## 2016-04-22 DIAGNOSIS — I639 Cerebral infarction, unspecified: Secondary | ICD-10-CM | POA: Diagnosis not present

## 2016-04-22 DIAGNOSIS — J40 Bronchitis, not specified as acute or chronic: Secondary | ICD-10-CM

## 2016-04-22 DIAGNOSIS — J301 Allergic rhinitis due to pollen: Secondary | ICD-10-CM | POA: Diagnosis not present

## 2016-04-22 MED ORDER — LEVOFLOXACIN 500 MG PO TABS: 500.0000 mg | ORAL_TABLET | Freq: Every day | ORAL | 0 refills | Status: DC

## 2016-04-22 MED ORDER — MONTELUKAST SODIUM 10 MG PO TABS: 10.0000 mg | ORAL_TABLET | Freq: Every day | ORAL | 3 refills | Status: DC

## 2016-04-22 MED ORDER — BENZONATATE 100 MG PO CAPS: 100.0000 mg | ORAL_CAPSULE | Freq: Two times a day (BID) | ORAL | 0 refills | Status: DC | PRN

## 2016-04-22 NOTE — Progress Notes (Signed)
Name: Danielle Taylor   MRN: [id number]    DOB: [birth year]   Date:04/22/2016       Progress Note  Subjective  Chief Complaint  Chief Complaint  Patient presents with  . Cough    started Sunday with a scratchy throat and a tickle in throat- then cough started yesterday    Cough  This is a recurrent problem. The current episode started in the past 7 days. The problem has been waxing and waning. The problem occurs hourly. The cough is productive of purulent sputum. Associated symptoms include ear congestion and shortness of breath. Pertinent negatives include no chest pain, chills, ear pain, fever, headaches, heartburn, hemoptysis, myalgias, nasal congestion, postnasal drip, rash, rhinorrhea, sore throat, sweats, weight loss or wheezing. Nothing aggravates the symptoms. She has tried a beta-agonist inhaler, steroid inhaler and oral steroids for the symptoms. The treatment provided mild relief. There is no history of environmental allergies.    No problem-specific Assessment & Plan notes found for this encounter.   Past Medical History:  Diagnosis Date  . Allergy   . COPD (chronic obstructive pulmonary disease) ([place])   . Depression   . Dysrhythmia    Complete Heart Block  . Gastritis, chronic   . GERD (gastroesophageal reflux disease)   . Graves disease   . History of hiatal hernia   . Hyperlipidemia   . Hypertension   . Hypothyroidism   . Presence of permanent cardiac pacemaker    2012    Past Surgical History:  Procedure Laterality Date  . APPENDECTOMY    . COLONOSCOPY  2000  . COLONOSCOPY WITH PROPOFOL N/A 04/26/2015   Procedure: COLONOSCOPY WITH PROPOFOL;  Surgeon: Hulen Luster, MD;  Location[facility] ENDOSCOPY;  Service: Gastroenterology;  Laterality: N/A;  . fibroid tumors     fingers and wrist  . FOOT SURGERY    . TONSILLECTOMY    . VAGINAL HYSTERECTOMY      History reviewed. No pertinent family history.  Social History   Social History  . Marital status:  Married    Spouse name: N/A  . Number of children: N/A  . Years of education: N/A   Occupational History  . Not on file.   Social History Main Topics  . Smoking status: Former Research scientist (life sciences)  . Smokeless tobacco: Never Used  . Alcohol use 0.0 oz/week  . Drug use: No  . Sexual activity: Yes   Other Topics Concern  . Not on file   Social History Narrative  . No narrative on file    Allergies  Allergen Reactions  . Penicillins Other (See Comments)    Has patient had a PCN reaction causing immediate rash, facial/tongue/throat swelling, SOB or lightheadedness with hypotension: No Has patient had a PCN reaction causing severe rash involving mucus membranes or skin necrosis: No Has patient had a PCN reaction that required hospitalization No Has patient had a PCN reaction occurring within the last 10 years: No If all of the above answers are "NO", then may proceed with Cephalosporin use.      Review of Systems  Constitutional: Negative for chills, fever, malaise/fatigue and weight loss.  HENT: Negative for ear discharge, ear pain, postnasal drip, rhinorrhea and sore throat.   Eyes: Negative for blurred vision.  Respiratory: Positive for cough and shortness of breath. Negative for hemoptysis, sputum production and wheezing.   Cardiovascular: Negative for chest pain, palpitations and leg swelling.  Gastrointestinal: Negative for abdominal pain, blood in stool, constipation, diarrhea, heartburn, melena  and nausea.  Genitourinary: Negative for dysuria, frequency, hematuria and urgency.  Musculoskeletal: Negative for back pain, joint pain, myalgias and neck pain.  Skin: Negative for rash.  Neurological: Negative for dizziness, tingling, sensory change, focal weakness and headaches.  Endo/Heme/Allergies: Negative for environmental allergies and polydipsia. Does not bruise/bleed easily.  Psychiatric/Behavioral: Negative for depression and suicidal ideas. The patient is not nervous/anxious and  does not have insomnia.      Objective  Vitals:   04/22/16 0759  BP: (!) 120/98  Pulse: 64  Temp: 97.9 F (36.6 C)  TempSrc: Oral  SpO2: 96%  Weight: 194 lb (88 kg)  Height: 5\' 4"  (1.626 m)    Physical Exam  Constitutional: She is well-developed, well-nourished, and in no distress. No distress.  HENT:  Head: Normocephalic and atraumatic.  Right Ear: External ear normal.  Left Ear: External ear normal.  Nose: Nose normal.  Mouth/Throat: Oropharynx is clear and moist.  Eyes: Conjunctivae and EOM are normal. Pupils are equal, round, and reactive to light. Right eye exhibits no discharge. Left eye exhibits no discharge.  Neck: Normal range of motion. Neck supple. No JVD present. No thyromegaly present.  Cardiovascular: Normal rate, regular rhythm, normal heart sounds and intact distal pulses.  Exam reveals no gallop and no friction rub.   No murmur heard. Pulmonary/Chest: Effort normal and breath sounds normal. She has no wheezes. She has no rales.  Abdominal: Soft. Bowel sounds are normal. She exhibits no mass. There is no tenderness. There is no guarding.  Musculoskeletal: Normal range of motion. She exhibits no edema.  Lymphadenopathy:    She has no cervical adenopathy.  Neurological: She is alert. She has normal reflexes.  Skin: Skin is warm and dry. She is not diaphoretic.  Psychiatric: Mood and affect normal.  Nursing note and vitals reviewed.     Assessment & Plan  Problem List Items Addressed This Visit      Respiratory   Reactive airway disease   Relevant Medications   benzonatate (TESSALON) 100 MG capsule   montelukast (SINGULAIR) 10 MG tablet    Other Visit Diagnoses    Bronchitis    -  Primary   Relevant Medications   levofloxacin (LEVAQUIN) 500 MG tablet   Allergic rhinitis due to pollen       Relevant Medications   benzonatate (TESSALON) 100 MG capsule   montelukast (SINGULAIR) 10 MG tablet        Dr. Khalidah Herbold [facility]  04/22/16

## 2016-05-08 DIAGNOSIS — M9908 Segmental and somatic dysfunction of rib cage: Secondary | ICD-10-CM | POA: Diagnosis not present

## 2016-05-08 DIAGNOSIS — M9902 Segmental and somatic dysfunction of thoracic region: Secondary | ICD-10-CM | POA: Diagnosis not present

## 2016-05-12 DIAGNOSIS — M9902 Segmental and somatic dysfunction of thoracic region: Secondary | ICD-10-CM | POA: Diagnosis not present

## 2016-05-12 DIAGNOSIS — M9908 Segmental and somatic dysfunction of rib cage: Secondary | ICD-10-CM | POA: Diagnosis not present

## 2016-05-14 DIAGNOSIS — M9902 Segmental and somatic dysfunction of thoracic region: Secondary | ICD-10-CM | POA: Diagnosis not present

## 2016-05-14 DIAGNOSIS — M9908 Segmental and somatic dysfunction of rib cage: Secondary | ICD-10-CM | POA: Diagnosis not present

## 2016-05-15 DIAGNOSIS — M9902 Segmental and somatic dysfunction of thoracic region: Secondary | ICD-10-CM | POA: Diagnosis not present

## 2016-05-15 DIAGNOSIS — M9908 Segmental and somatic dysfunction of rib cage: Secondary | ICD-10-CM | POA: Diagnosis not present

## 2016-05-15 DIAGNOSIS — M542 Cervicalgia: Secondary | ICD-10-CM | POA: Diagnosis not present

## 2016-05-15 DIAGNOSIS — M9901 Segmental and somatic dysfunction of cervical region: Secondary | ICD-10-CM | POA: Diagnosis not present

## 2016-05-25 DIAGNOSIS — M9908 Segmental and somatic dysfunction of rib cage: Secondary | ICD-10-CM | POA: Diagnosis not present

## 2016-05-25 DIAGNOSIS — M9902 Segmental and somatic dysfunction of thoracic region: Secondary | ICD-10-CM | POA: Diagnosis not present

## 2016-05-28 ENCOUNTER — Ambulatory Visit (INDEPENDENT_AMBULATORY_CARE_PROVIDER_SITE_OTHER): Payer: Medicare Other

## 2016-05-28 ENCOUNTER — Other Ambulatory Visit: Payer: Self-pay | Admitting: Family Medicine

## 2016-05-28 DIAGNOSIS — Z23 Encounter for immunization: Secondary | ICD-10-CM | POA: Diagnosis not present

## 2016-05-28 DIAGNOSIS — E7849 Other hyperlipidemia: Secondary | ICD-10-CM

## 2016-05-28 DIAGNOSIS — E785 Hyperlipidemia, unspecified: Secondary | ICD-10-CM

## 2016-06-04 ENCOUNTER — Other Ambulatory Visit: Payer: Self-pay | Admitting: Family Medicine

## 2016-06-04 DIAGNOSIS — M9908 Segmental and somatic dysfunction of rib cage: Secondary | ICD-10-CM | POA: Diagnosis not present

## 2016-06-04 DIAGNOSIS — M9902 Segmental and somatic dysfunction of thoracic region: Secondary | ICD-10-CM | POA: Diagnosis not present

## 2016-06-04 DIAGNOSIS — M9901 Segmental and somatic dysfunction of cervical region: Secondary | ICD-10-CM | POA: Diagnosis not present

## 2016-06-04 DIAGNOSIS — M542 Cervicalgia: Secondary | ICD-10-CM | POA: Diagnosis not present

## 2016-06-12 DIAGNOSIS — M9908 Segmental and somatic dysfunction of rib cage: Secondary | ICD-10-CM | POA: Diagnosis not present

## 2016-06-12 DIAGNOSIS — M9901 Segmental and somatic dysfunction of cervical region: Secondary | ICD-10-CM | POA: Diagnosis not present

## 2016-06-12 DIAGNOSIS — M9902 Segmental and somatic dysfunction of thoracic region: Secondary | ICD-10-CM | POA: Diagnosis not present

## 2016-06-18 ENCOUNTER — Other Ambulatory Visit: Payer: Self-pay | Admitting: Family Medicine

## 2016-06-18 DIAGNOSIS — E039 Hypothyroidism, unspecified: Secondary | ICD-10-CM

## 2016-06-22 DIAGNOSIS — G479 Sleep disorder, unspecified: Secondary | ICD-10-CM | POA: Diagnosis not present

## 2016-06-22 DIAGNOSIS — J449 Chronic obstructive pulmonary disease, unspecified: Secondary | ICD-10-CM | POA: Diagnosis not present

## 2016-06-26 ENCOUNTER — Ambulatory Visit (INDEPENDENT_AMBULATORY_CARE_PROVIDER_SITE_OTHER): Payer: Medicare Other | Admitting: Family Medicine

## 2016-06-26 ENCOUNTER — Encounter: Payer: Self-pay | Admitting: Family Medicine

## 2016-06-26 VITALS — BP 112/80 | HR 80 | Ht 64.0 in | Wt 200.0 lb

## 2016-06-26 DIAGNOSIS — I639 Cerebral infarction, unspecified: Secondary | ICD-10-CM | POA: Diagnosis not present

## 2016-06-26 DIAGNOSIS — R233 Spontaneous ecchymoses: Secondary | ICD-10-CM

## 2016-06-26 DIAGNOSIS — M9901 Segmental and somatic dysfunction of cervical region: Secondary | ICD-10-CM | POA: Diagnosis not present

## 2016-06-26 DIAGNOSIS — M9902 Segmental and somatic dysfunction of thoracic region: Secondary | ICD-10-CM | POA: Diagnosis not present

## 2016-06-26 DIAGNOSIS — M9908 Segmental and somatic dysfunction of rib cage: Secondary | ICD-10-CM | POA: Diagnosis not present

## 2016-06-26 DIAGNOSIS — M5030 Other cervical disc degeneration, unspecified cervical region: Secondary | ICD-10-CM | POA: Diagnosis not present

## 2016-06-26 NOTE — Patient Instructions (Signed)
Hematoma  A hematoma is a collection of blood under the skin, in an organ, in a body space, in a joint space, or in other tissue. The blood can clot to form a lump that you can see and feel. The lump is often firm and may sometimes become sore and tender. Most hematomas get better in a few days to weeks. However, some hematomas may be serious and require medical care. Hematomas can range in size from very small to very large.  CAUSES   A hematoma can be caused by a blunt or penetrating injury. It can also be caused by spontaneous leakage from a blood vessel under the skin. Spontaneous leakage from a blood vessel is more likely to occur in older people, especially those taking blood thinners. Sometimes, a hematoma can develop after certain medical procedures.  SIGNS AND SYMPTOMS   · A firm lump on the body.  · Possible pain and tenderness in the area.  · Bruising. Blue, dark blue, purple-red, or yellowish skin may appear at the site of the hematoma if the hematoma is close to the surface of the skin.  For hematomas in deeper tissues or body spaces, the signs and symptoms may be subtle. For example, an intra-abdominal hematoma may cause abdominal pain, weakness, fainting, and shortness of breath. An intracranial hematoma may cause a headache or symptoms such as weakness, trouble speaking, or a change in consciousness.  DIAGNOSIS   A hematoma can usually be diagnosed based on your medical history and a physical exam. Imaging tests may be needed if your health care provider suspects a hematoma in deeper tissues or body spaces, such as the abdomen, head, or chest. These tests may include ultrasonography or a CT scan.   TREATMENT   Hematomas usually go away on their own over time. Rarely does the blood need to be drained out of the body. Large hematomas or those that may affect vital organs will sometimes need surgical drainage or monitoring.  HOME CARE INSTRUCTIONS   · Apply ice to the injured area:      Put ice in a  plastic bag.      Place a towel between your skin and the bag.      Leave the ice on for 20 minutes, 2-3 times a day for the first 1 to 2 days.    · After the first 2 days, switch to using warm compresses on the hematoma.    · Elevate the injured area to help decrease pain and swelling. Wrapping the area with an elastic bandage may also be helpful. Compression helps to reduce swelling and promotes shrinking of the hematoma. Make sure the bandage is not wrapped too tight.    · If your hematoma is on a lower extremity and is painful, crutches may be helpful for a couple days.    · Only take over-the-counter or prescription medicines as directed by your health care provider.  SEEK IMMEDIATE MEDICAL CARE IF:   · You have increasing pain, or your pain is not controlled with medicine.    · You have a fever.    · You have worsening swelling or discoloration.    · Your skin over the hematoma breaks or starts bleeding.    · Your hematoma is in your chest or abdomen and you have weakness, shortness of breath, or a change in consciousness.  · Your hematoma is on your scalp (caused by a fall or injury) and you have a worsening headache or a change in alertness or consciousness.  MAKE SURE YOU:   ·   Understand these instructions.  · Will watch your condition.  · Will get help right away if you are not doing well or get worse.     This information is not intended to replace advice given to you by your health care provider. Make sure you discuss any questions you have with your health care provider.     Document Released: 03/24/2004 Document Revised: 04/12/2013 Document Reviewed: 01/18/2013  Elsevier Interactive Patient Education ©2016 [employer]

## 2016-06-26 NOTE — Progress Notes (Signed)
Name: Danielle Taylor   MRN: [id number]    DOB: November 19, 1945   Date:06/26/2016       Progress Note  Subjective  Chief Complaint  Chief Complaint  Patient presents with  . Hand Pain    Knot came up on hand- hasn't hit hand- has swelling and bruising  . Neck Pain    pain in back of neck-     Hand Pain   The incident occurred 3 to 5 days ago. The incident occurred at home. The pain is present in the right hand. The quality of the pain is described as aching. The pain is at a severity of 2/10. The pain is mild. Pertinent negatives include no chest pain or tingling. The symptoms are aggravated by movement. She has tried acetaminophen for the symptoms. The treatment provided mild relief.  Neck Pain   This is a chronic problem. The current episode started more than 1 year ago. The problem occurs intermittently. The problem has been waxing and waning. The pain is associated with nothing. The pain is present in the midline. The pain is at a severity of 2/10. Pertinent negatives include no chest pain, fever, headaches, pain with swallowing, photophobia, tingling, visual change, weakness or weight loss. She has tried NSAIDs and acetaminophen for the symptoms. The treatment provided mild relief.    No problem-specific Assessment & Plan notes found for this encounter.   Past Medical History:  Diagnosis Date  . Allergy   . COPD (chronic obstructive pulmonary disease) ([place])   . Depression   . Dysrhythmia    Complete Heart Block  . Gastritis, chronic   . GERD (gastroesophageal reflux disease)   . Graves disease   . History of hiatal hernia   . Hyperlipidemia   . Hypertension   . Hypothyroidism   . Presence of permanent cardiac pacemaker    2012    Past Surgical History:  Procedure Laterality Date  . APPENDECTOMY    . COLONOSCOPY  2000  . COLONOSCOPY WITH PROPOFOL N/A 04/26/2015   Procedure: COLONOSCOPY WITH PROPOFOL;  Surgeon: Hulen Luster, MD;  Location: [facility] ENDOSCOPY;  Service:  Gastroenterology;  Laterality: N/A;  . fibroid tumors     fingers and wrist  . FOOT SURGERY    . TONSILLECTOMY    . VAGINAL HYSTERECTOMY      History reviewed. No pertinent family history.  Social History   Social History  . Marital status: Married    Spouse name: N/A  . Number of children: N/A  . Years of education: N/A   Occupational History  . Not on file.   Social History Main Topics  . Smoking status: Former Research scientist (life sciences)  . Smokeless tobacco: Never Used  . Alcohol use 0.0 oz/week  . Drug use: No  . Sexual activity: Yes   Other Topics Concern  . Not on file   Social History Narrative  . No narrative on file    Allergies  Allergen Reactions  . Penicillins Other (See Comments)    Has patient had a PCN reaction causing immediate rash, facial/tongue/throat swelling, SOB or lightheadedness with hypotension: No Has patient had a PCN reaction causing severe rash involving mucus membranes or skin necrosis: No Has patient had a PCN reaction that required hospitalization No Has patient had a PCN reaction occurring within the last 10 years: No If all of the above answers are "NO", then may proceed with Cephalosporin use.      Review of Systems  Constitutional: Negative for  chills, fever, malaise/fatigue and weight loss.  HENT: Negative for ear discharge, ear pain and sore throat.   Eyes: Negative for blurred vision and photophobia.  Respiratory: Negative for cough, sputum production, shortness of breath and wheezing.   Cardiovascular: Negative for chest pain, palpitations and leg swelling.  Gastrointestinal: Negative for abdominal pain, blood in stool, constipation, diarrhea, heartburn, melena and nausea.  Genitourinary: Negative for dysuria, frequency, hematuria and urgency.  Musculoskeletal: Positive for neck pain. Negative for back pain, joint pain and myalgias.  Skin: Negative for rash.  Neurological: Negative for dizziness, tingling, sensory change, focal weakness,  weakness and headaches.  Endo/Heme/Allergies: Negative for environmental allergies and polydipsia. Does not bruise/bleed easily.  Psychiatric/Behavioral: Negative for depression and suicidal ideas. The patient is not nervous/anxious and does not have insomnia.      Objective  Vitals:   06/26/16 1407  BP: 112/80  Pulse: 80  Weight: 200 lb (90.7 kg)  Height: 5\' 4"  (1.626 m)    Physical Exam  Constitutional: She is well-developed, well-nourished, and in no distress. No distress.  HENT:  Head: Normocephalic and atraumatic.  Right Ear: External ear normal.  Left Ear: External ear normal.  Nose: Nose normal.  Mouth/Throat: Oropharynx is clear and moist.  Eyes: Conjunctivae and EOM are normal. Pupils are equal, round, and reactive to light. Right eye exhibits no discharge. Left eye exhibits no discharge.  Neck: Normal range of motion. Neck supple. No JVD present. No thyromegaly present.  Cardiovascular: Normal rate, regular rhythm, normal heart sounds and intact distal pulses.  Exam reveals no gallop and no friction rub.   No murmur heard. Pulmonary/Chest: Effort normal and breath sounds normal.  Abdominal: Soft. Bowel sounds are normal. She exhibits no mass. There is no tenderness. There is no guarding.  Musculoskeletal: Normal range of motion. She exhibits no edema.       Cervical back: She exhibits spasm. She exhibits no tenderness and no bony tenderness.  Lymphadenopathy:    She has no cervical adenopathy.  Neurological: She is alert.  Skin: Skin is warm and dry. Ecchymosis noted. She is not diaphoretic. No cyanosis. Nails show no clubbing.     Psychiatric: Mood and affect normal.  Nursing note and vitals reviewed.     Assessment & Plan  Problem List Items Addressed This Visit    None    Visit Diagnoses    Ecchymoses, spontaneous    -  Primary   Degeneration of intervertebral disc of cervical region        I spent 30 minutes with this patient, More than 50% of that  time was spent in face to face education, counseling and care coordination.    Dr. [facility]  06/26/16

## 2016-07-07 ENCOUNTER — Other Ambulatory Visit: Payer: Self-pay | Admitting: Family Medicine

## 2016-07-10 DIAGNOSIS — M9902 Segmental and somatic dysfunction of thoracic region: Secondary | ICD-10-CM | POA: Diagnosis not present

## 2016-07-10 DIAGNOSIS — M9908 Segmental and somatic dysfunction of rib cage: Secondary | ICD-10-CM | POA: Diagnosis not present

## 2016-07-10 DIAGNOSIS — M9901 Segmental and somatic dysfunction of cervical region: Secondary | ICD-10-CM | POA: Diagnosis not present

## 2016-07-21 ENCOUNTER — Other Ambulatory Visit: Payer: Self-pay | Admitting: Family Medicine

## 2016-07-30 ENCOUNTER — Other Ambulatory Visit: Payer: Self-pay | Admitting: Family Medicine

## 2016-07-30 DIAGNOSIS — I10 Essential (primary) hypertension: Secondary | ICD-10-CM

## 2016-07-31 DIAGNOSIS — M9908 Segmental and somatic dysfunction of rib cage: Secondary | ICD-10-CM | POA: Diagnosis not present

## 2016-07-31 DIAGNOSIS — M9902 Segmental and somatic dysfunction of thoracic region: Secondary | ICD-10-CM | POA: Diagnosis not present

## 2016-07-31 DIAGNOSIS — M9901 Segmental and somatic dysfunction of cervical region: Secondary | ICD-10-CM | POA: Diagnosis not present

## 2016-08-09 ENCOUNTER — Other Ambulatory Visit: Payer: Self-pay | Admitting: Family Medicine

## 2016-08-10 DIAGNOSIS — M9902 Segmental and somatic dysfunction of thoracic region: Secondary | ICD-10-CM | POA: Diagnosis not present

## 2016-08-10 DIAGNOSIS — M9908 Segmental and somatic dysfunction of rib cage: Secondary | ICD-10-CM | POA: Diagnosis not present

## 2016-08-14 ENCOUNTER — Other Ambulatory Visit: Payer: Self-pay | Admitting: Family Medicine

## 2016-08-14 DIAGNOSIS — M9908 Segmental and somatic dysfunction of rib cage: Secondary | ICD-10-CM | POA: Diagnosis not present

## 2016-08-14 DIAGNOSIS — M9902 Segmental and somatic dysfunction of thoracic region: Secondary | ICD-10-CM | POA: Diagnosis not present

## 2016-08-14 DIAGNOSIS — J301 Allergic rhinitis due to pollen: Secondary | ICD-10-CM

## 2016-08-14 DIAGNOSIS — J4520 Mild intermittent asthma, uncomplicated: Secondary | ICD-10-CM

## 2016-08-28 DIAGNOSIS — M9902 Segmental and somatic dysfunction of thoracic region: Secondary | ICD-10-CM | POA: Diagnosis not present

## 2016-09-15 ENCOUNTER — Other Ambulatory Visit: Payer: Self-pay | Admitting: Family Medicine

## 2016-09-21 DIAGNOSIS — M9906 Segmental and somatic dysfunction of lower extremity: Secondary | ICD-10-CM | POA: Diagnosis not present

## 2016-09-21 DIAGNOSIS — M9902 Segmental and somatic dysfunction of thoracic region: Secondary | ICD-10-CM | POA: Diagnosis not present

## 2016-09-30 ENCOUNTER — Other Ambulatory Visit: Payer: Self-pay | Admitting: Family Medicine

## 2016-09-30 DIAGNOSIS — E039 Hypothyroidism, unspecified: Secondary | ICD-10-CM

## 2016-10-01 DIAGNOSIS — Z950 Presence of cardiac pacemaker: Secondary | ICD-10-CM | POA: Diagnosis not present

## 2016-10-01 DIAGNOSIS — I442 Atrioventricular block, complete: Secondary | ICD-10-CM | POA: Diagnosis not present

## 2016-10-01 DIAGNOSIS — Z8619 Personal history of other infectious and parasitic diseases: Secondary | ICD-10-CM | POA: Diagnosis not present

## 2016-10-01 DIAGNOSIS — E785 Hyperlipidemia, unspecified: Secondary | ICD-10-CM | POA: Diagnosis not present

## 2016-10-01 DIAGNOSIS — Z45018 Encounter for adjustment and management of other part of cardiac pacemaker: Secondary | ICD-10-CM | POA: Diagnosis not present

## 2016-10-01 DIAGNOSIS — Z7982 Long term (current) use of aspirin: Secondary | ICD-10-CM | POA: Diagnosis not present

## 2016-10-01 DIAGNOSIS — Z79899 Other long term (current) drug therapy: Secondary | ICD-10-CM | POA: Diagnosis not present

## 2016-10-01 DIAGNOSIS — I480 Paroxysmal atrial fibrillation: Secondary | ICD-10-CM | POA: Diagnosis not present

## 2016-10-01 DIAGNOSIS — E039 Hypothyroidism, unspecified: Secondary | ICD-10-CM | POA: Diagnosis not present

## 2016-10-01 DIAGNOSIS — I498 Other specified cardiac arrhythmias: Secondary | ICD-10-CM | POA: Diagnosis not present

## 2016-10-01 DIAGNOSIS — Z7901 Long term (current) use of anticoagulants: Secondary | ICD-10-CM | POA: Diagnosis not present

## 2016-10-05 DIAGNOSIS — M9902 Segmental and somatic dysfunction of thoracic region: Secondary | ICD-10-CM | POA: Diagnosis not present

## 2016-10-11 ENCOUNTER — Other Ambulatory Visit: Payer: Self-pay | Admitting: Family Medicine

## 2016-10-11 DIAGNOSIS — E785 Hyperlipidemia, unspecified: Secondary | ICD-10-CM

## 2016-10-11 DIAGNOSIS — E7849 Other hyperlipidemia: Secondary | ICD-10-CM

## 2016-10-18 ENCOUNTER — Other Ambulatory Visit: Payer: Self-pay | Admitting: Family Medicine

## 2016-10-22 ENCOUNTER — Ambulatory Visit (INDEPENDENT_AMBULATORY_CARE_PROVIDER_SITE_OTHER): Payer: Medicare Other | Admitting: Family Medicine

## 2016-10-22 ENCOUNTER — Encounter: Payer: Self-pay | Admitting: Family Medicine

## 2016-10-22 VITALS — BP 120/80 | HR 90 | Temp 98.5°F | Ht 64.0 in | Wt 202.0 lb

## 2016-10-22 DIAGNOSIS — J209 Acute bronchitis, unspecified: Secondary | ICD-10-CM

## 2016-10-22 DIAGNOSIS — J0100 Acute maxillary sinusitis, unspecified: Secondary | ICD-10-CM

## 2016-10-22 DIAGNOSIS — J301 Allergic rhinitis due to pollen: Secondary | ICD-10-CM | POA: Diagnosis not present

## 2016-10-22 DIAGNOSIS — J4520 Mild intermittent asthma, uncomplicated: Secondary | ICD-10-CM | POA: Diagnosis not present

## 2016-10-22 DIAGNOSIS — J440 Chronic obstructive pulmonary disease with acute lower respiratory infection: Secondary | ICD-10-CM

## 2016-10-22 MED ORDER — GUAIFENESIN-CODEINE 100-10 MG/5ML PO SYRP: 5.0000 mL | ORAL_SOLUTION | Freq: Three times a day (TID) | ORAL | 0 refills | Status: DC | PRN

## 2016-10-22 MED ORDER — LEVOFLOXACIN 500 MG PO TABS: 500.0000 mg | ORAL_TABLET | Freq: Every day | ORAL | 0 refills | Status: DC

## 2016-10-22 MED ORDER — MONTELUKAST SODIUM 10 MG PO TABS: 10.0000 mg | ORAL_TABLET | Freq: Every day | ORAL | 3 refills | Status: DC

## 2016-10-22 MED ORDER — ALBUTEROL SULFATE HFA 108 (90 BASE) MCG/ACT IN AERS: 1.0000 | INHALATION_SPRAY | Freq: Four times a day (QID) | RESPIRATORY_TRACT | 11 refills | Status: DC | PRN

## 2016-10-22 MED ORDER — BECLOMETHASONE DIPROPIONATE 80 MCG/ACT IN AERS: 1.0000 | INHALATION_SPRAY | Freq: Two times a day (BID) | RESPIRATORY_TRACT | 11 refills | Status: DC

## 2016-10-22 NOTE — Progress Notes (Signed)
Name: Danielle Taylor   MRN: [id number]    DOB: 04/09/1946   Date:10/22/2016       Progress Note  Subjective  Chief Complaint  Chief Complaint  Patient presents with  . Sinusitis    yellow production from cough    Sinusitis  This is a new problem. The current episode started yesterday. The problem has been gradually improving since onset. There has been no fever. Associated symptoms include congestion, coughing, shortness of breath, sinus pressure and sneezing. Pertinent negatives include no chills, diaphoresis, ear pain, headaches, hoarse voice, neck pain or sore throat. Past treatments include nothing.  Cough  This is a new problem. The current episode started yesterday. The problem has been gradually worsening. The cough is non-productive. Associated symptoms include nasal congestion and shortness of breath. Pertinent negatives include no chest pain, chills, ear pain, fever, headaches, heartburn, hemoptysis, myalgias, postnasal drip, rash, sore throat, weight loss or wheezing. The symptoms are aggravated by pollens. She has tried leukotriene antagonists and a beta-agonist inhaler for the symptoms. Her past medical history is significant for asthma. There is no history of environmental allergies.    No problem-specific Assessment & Plan notes found for this encounter.   Past Medical History:  Diagnosis Date  . Allergy   . COPD (chronic obstructive pulmonary disease) ([employer])   . Depression   . Dysrhythmia    Complete Heart Block  . Gastritis, chronic   . GERD (gastroesophageal reflux disease)   . Graves disease   . History of hiatal hernia   . Hyperlipidemia   . Hypertension   . Hypothyroidism   . Presence of permanent cardiac pacemaker    2012    Past Surgical History:  Procedure Laterality Date  . APPENDECTOMY    . COLONOSCOPY  2000  . COLONOSCOPY WITH PROPOFOL N/A 04/26/2015   Procedure: COLONOSCOPY WITH PROPOFOL;  Surgeon: Hulen Luster, MD;  Location: [facility] ENDOSCOPY;   Service: Gastroenterology;  Laterality: N/A;  . fibroid tumors     fingers and wrist  . FOOT SURGERY    . TONSILLECTOMY    . VAGINAL HYSTERECTOMY      No family history on file.  Social History   Social History  . Marital status: Married    Spouse name: N/A  . Number of children: N/A  . Years of education: N/A   Occupational History  . Not on file.   Social History Main Topics  . Smoking status: Former Research scientist (life sciences)  . Smokeless tobacco: Never Used  . Alcohol use 0.0 oz/week  . Drug use: No  . Sexual activity: Yes   Other Topics Concern  . Not on file   Social History Narrative  . No narrative on file    Allergies  Allergen Reactions  . Penicillins Other (See Comments)    Has patient had a PCN reaction causing immediate rash, facial/tongue/throat swelling, SOB or lightheadedness with hypotension: No Has patient had a PCN reaction causing severe rash involving mucus membranes or skin necrosis: No Has patient had a PCN reaction that required hospitalization No Has patient had a PCN reaction occurring within the last 10 years: No If all of the above answers are "NO", then may proceed with Cephalosporin use.     Outpatient Medications Prior to Visit  Medication Sig Dispense Refill  . atorvastatin (LIPITOR) 20 MG tablet TAKE 1 TABLET BY MOUTH DAILY. (CALL AND SCHEDULE APPT) 30 tablet 1  . citalopram (CELEXA) 40 MG tablet TAKE 1 TABLET (40 MG  TOTAL) BY MOUTH DAILY. 30 tablet 1  . gemfibrozil (LOPID) 600 MG tablet TAKE 1 TABLET (600 MG TOTAL) BY MOUTH DAILY. 90 tablet 0  . levothyroxine (SYNTHROID, LEVOTHROID) 112 MCG tablet TAKE 1 TABLET EVERY DAY 30 tablet 0  . loratadine (CLARITIN) 10 MG tablet TAKE 1 TABLET (10 MG TOTAL) BY MOUTH DAILY. 30 tablet 5  . losartan (COZAAR) 50 MG tablet Take 1 tablet (50 mg total) by mouth daily. 90 tablet 1  . metoprolol succinate (TOPROL-XL) 50 MG 24 hr tablet TAKE 1 TABLET (50 MG TOTAL) BY MOUTH DAILY. 90 tablet 0  . Multiple  Vitamins-Minerals (CENTRUM SILVER PO) Take 1 tablet by mouth daily.    . Omega-3 Fatty Acids (FISH OIL) 1000 MG CAPS Take 1 capsule by mouth daily.    . rivaroxaban (XARELTO) 20 MG TABS tablet Take 1 tablet by mouth daily. Dr Allena Earing at [facility]    . simvastatin (ZOCOR) 20 MG tablet TAKE 1 TABLET BY MOUTH EVERY DAY 90 tablet 0  . albuterol (PROVENTIL HFA;VENTOLIN HFA) 108 (90 BASE) MCG/ACT inhaler Inhale 1-2 puffs into the lungs every 6 (six) hours as needed for wheezing or shortness of breath. 1 Inhaler 11  . beclomethasone (QVAR) 80 MCG/ACT inhaler Inhale 1 puff into the lungs 2 (two) times daily.    . montelukast (SINGULAIR) 10 MG tablet TAKE 1 TABLET (10 MG TOTAL) BY MOUTH AT BEDTIME. 30 tablet 3  . albuterol (PROVENTIL) (2.5 MG/3ML) 0.083% nebulizer solution Take 3 mLs (2.5 mg total) by nebulization every 6 (six) hours as needed for wheezing or shortness of breath. (Patient not taking: Reported on 10/22/2016) 75 mL 12   Facility-Administered Medications Prior to Visit  Medication Dose Route Frequency Provider Last Rate Last Dose  . albuterol (PROVENTIL) (2.5 MG/3ML) 0.083% nebulizer solution 2.5 mg  2.5 mg Nebulization Once Juline Patch, MD        Review of Systems  Constitutional: Negative for chills, diaphoresis, fever, malaise/fatigue and weight loss.  HENT: Positive for congestion, sinus pressure and sneezing. Negative for ear discharge, ear pain, hoarse voice, postnasal drip and sore throat.   Eyes: Negative for blurred vision.  Respiratory: Positive for cough and shortness of breath. Negative for hemoptysis, sputum production and wheezing.   Cardiovascular: Negative for chest pain, palpitations and leg swelling.  Gastrointestinal: Negative for abdominal pain, blood in stool, constipation, diarrhea, heartburn, melena and nausea.  Genitourinary: Negative for dysuria, frequency, hematuria and urgency.  Musculoskeletal: Negative for back pain, joint pain, myalgias and neck pain.  Skin:  Negative for rash.  Neurological: Negative for dizziness, tingling, sensory change, focal weakness and headaches.  Endo/Heme/Allergies: Negative for environmental allergies and polydipsia. Does not bruise/bleed easily.  Psychiatric/Behavioral: Negative for depression and suicidal ideas. The patient is not nervous/anxious and does not have insomnia.      Objective  Vitals:   10/22/16 0930  BP: 120/80  Pulse: 90  Temp: 98.5 F (36.9 C)  TempSrc: Oral  SpO2: 94%  Weight: 202 lb (91.6 kg)  Height: 5\' 4"  (1.626 m)    Physical Exam  Constitutional: She is well-developed, well-nourished, and in no distress. No distress.  HENT:  Head: Normocephalic and atraumatic.  Right Ear: External ear normal.  Left Ear: External ear normal.  Nose: Nose normal.  Mouth/Throat: Oropharynx is clear and moist.  Eyes: Conjunctivae and EOM are normal. Pupils are equal, round, and reactive to light. Right eye exhibits no discharge. Left eye exhibits no discharge.  Neck: Normal range of motion.  Neck supple. No JVD present. No thyromegaly present.  Cardiovascular: Normal rate, regular rhythm, normal heart sounds and intact distal pulses.  Exam reveals no gallop and no friction rub.   No murmur heard. Pulmonary/Chest: Effort normal. She has wheezes. She has no rales.  Abdominal: Soft. Bowel sounds are normal. She exhibits no mass. There is no tenderness. There is no guarding.  Musculoskeletal: Normal range of motion. She exhibits no edema.  Lymphadenopathy:    She has no cervical adenopathy.  Neurological: She is alert. She has normal reflexes.  Skin: Skin is warm and dry. She is not diaphoretic.  Psychiatric: Mood and affect normal.      Assessment & Plan  Problem List Items Addressed This Visit      Respiratory   Acute bronchitis   Relevant Medications   albuterol (PROVENTIL HFA;VENTOLIN HFA) 108 (90 Base) MCG/ACT inhaler   beclomethasone (QVAR) 80 MCG/ACT inhaler    Other Visit Diagnoses     Acute bronchitis with COPD ([place])    -  Primary   Relevant Medications   albuterol (PROVENTIL HFA;VENTOLIN HFA) 108 (90 Base) MCG/ACT inhaler   beclomethasone (QVAR) 80 MCG/ACT inhaler   montelukast (SINGULAIR) 10 MG tablet   levofloxacin (LEVAQUIN) 500 MG tablet   guaiFENesin-codeine (ROBITUSSIN AC) 100-10 MG/5ML syrup   Reactive airway disease, mild intermittent, uncomplicated       Relevant Medications   albuterol (PROVENTIL HFA;VENTOLIN HFA) 108 (90 Base) MCG/ACT inhaler   beclomethasone (QVAR) 80 MCG/ACT inhaler   montelukast (SINGULAIR) 10 MG tablet   Chronic seasonal allergic rhinitis due to pollen       Relevant Medications   montelukast (SINGULAIR) 10 MG tablet   Acute non-recurrent maxillary sinusitis       Relevant Medications   levofloxacin (LEVAQUIN) 500 MG tablet   guaiFENesin-codeine (ROBITUSSIN AC) 100-10 MG/5ML syrup      Meds ordered this encounter  Medications  . albuterol (PROVENTIL HFA;VENTOLIN HFA) 108 (90 Base) MCG/ACT inhaler    Sig: Inhale 1-2 puffs into the lungs every 6 (six) hours as needed for wheezing or shortness of breath.    Dispense:  1 Inhaler    Refill:  11  . beclomethasone (QVAR) 80 MCG/ACT inhaler    Sig: Inhale 1 puff into the lungs 2 (two) times daily.    Dispense:  1 Inhaler    Refill:  11  . montelukast (SINGULAIR) 10 MG tablet    Sig: Take 1 tablet (10 mg total) by mouth at bedtime.    Dispense:  30 tablet    Refill:  3  . levofloxacin (LEVAQUIN) 500 MG tablet    Sig: Take 1 tablet (500 mg total) by mouth daily.    Dispense:  7 tablet    Refill:  0  . guaiFENesin-codeine (ROBITUSSIN AC) 100-10 MG/5ML syrup    Sig: Take 5 mLs by mouth 3 (three) times daily as needed for cough.    Dispense:  150 mL    Refill:  0      Dr. Zharia Conrow [facility]  10/22/16

## 2016-10-27 ENCOUNTER — Other Ambulatory Visit: Payer: Self-pay | Admitting: Family Medicine

## 2016-10-27 DIAGNOSIS — E039 Hypothyroidism, unspecified: Secondary | ICD-10-CM

## 2016-10-29 ENCOUNTER — Other Ambulatory Visit: Payer: Self-pay | Admitting: Family Medicine

## 2016-10-29 DIAGNOSIS — I10 Essential (primary) hypertension: Secondary | ICD-10-CM

## 2016-10-30 DIAGNOSIS — M545 Low back pain: Secondary | ICD-10-CM | POA: Diagnosis not present

## 2016-10-30 DIAGNOSIS — M9904 Segmental and somatic dysfunction of sacral region: Secondary | ICD-10-CM | POA: Diagnosis not present

## 2016-10-30 DIAGNOSIS — M9903 Segmental and somatic dysfunction of lumbar region: Secondary | ICD-10-CM | POA: Diagnosis not present

## 2016-11-05 ENCOUNTER — Telehealth: Payer: Self-pay

## 2016-11-05 ENCOUNTER — Other Ambulatory Visit: Payer: Self-pay

## 2016-11-05 DIAGNOSIS — J0100 Acute maxillary sinusitis, unspecified: Secondary | ICD-10-CM

## 2016-11-05 DIAGNOSIS — J440 Chronic obstructive pulmonary disease with acute lower respiratory infection: Principal | ICD-10-CM

## 2016-11-05 DIAGNOSIS — J209 Acute bronchitis, unspecified: Secondary | ICD-10-CM

## 2016-11-05 MED ORDER — LEVOFLOXACIN 500 MG PO TABS: 500.0000 mg | ORAL_TABLET | Freq: Every day | ORAL | 0 refills | Status: DC

## 2016-11-05 NOTE — Telephone Encounter (Signed)
Pt called wanting a refill on Levaquin- will send in, but need to see if not better- sent to [facility] [place]

## 2016-11-10 ENCOUNTER — Other Ambulatory Visit: Payer: Self-pay | Admitting: Family Medicine

## 2016-11-23 ENCOUNTER — Encounter: Payer: Self-pay | Admitting: Family Medicine

## 2016-11-23 ENCOUNTER — Ambulatory Visit (INDEPENDENT_AMBULATORY_CARE_PROVIDER_SITE_OTHER): Payer: Medicare Other | Admitting: Family Medicine

## 2016-11-23 VITALS — BP 142/100 | HR 98 | Temp 98.1°F | Ht 64.0 in | Wt 203.0 lb

## 2016-11-23 DIAGNOSIS — J449 Chronic obstructive pulmonary disease, unspecified: Secondary | ICD-10-CM

## 2016-11-23 DIAGNOSIS — J440 Chronic obstructive pulmonary disease with acute lower respiratory infection: Secondary | ICD-10-CM

## 2016-11-23 DIAGNOSIS — J4521 Mild intermittent asthma with (acute) exacerbation: Secondary | ICD-10-CM

## 2016-11-23 DIAGNOSIS — R6883 Chills (without fever): Secondary | ICD-10-CM

## 2016-11-23 DIAGNOSIS — J209 Acute bronchitis, unspecified: Secondary | ICD-10-CM | POA: Diagnosis not present

## 2016-11-23 DIAGNOSIS — J219 Acute bronchiolitis, unspecified: Secondary | ICD-10-CM

## 2016-11-23 DIAGNOSIS — J4489 Other specified chronic obstructive pulmonary disease: Secondary | ICD-10-CM

## 2016-11-23 LAB — POCT INFLUENZA A/B
Influenza A, POC: NEGATIVE
Influenza B, POC: NEGATIVE

## 2016-11-23 MED ORDER — PREDNISONE 10 MG PO TABS: 10.0000 mg | ORAL_TABLET | Freq: Every day | ORAL | 0 refills | Status: DC

## 2016-11-23 MED ORDER — GUAIFENESIN-CODEINE 100-10 MG/5ML PO SYRP
5.0000 mL | ORAL_SOLUTION | Freq: Three times a day (TID) | ORAL | 0 refills | Status: DC | PRN
Start: 2016-11-23 — End: 2017-02-08

## 2016-11-23 MED ORDER — AZITHROMYCIN 250 MG PO TABS: ORAL_TABLET | ORAL | 0 refills | Status: DC

## 2016-11-23 NOTE — Progress Notes (Signed)
Name: Rowene Suto   MRN: [id number]    DOB: 09/21/1945   Date:11/23/2016       Progress Note  Subjective  Chief Complaint  Chief Complaint  Patient presents with  . Bronchitis    cough, cong, green production, SOB (hard to breathe), body aches- finished Levaquin on 11/12/16    Cough  This is a new problem. The current episode started in the past 7 days. The problem has been gradually worsening. The cough is productive of purulent sputum. Associated symptoms include chills, ear congestion, myalgias, nasal congestion, rhinorrhea, shortness of breath and wheezing. Pertinent negatives include no chest pain, ear pain, fever, headaches, heartburn, hemoptysis, postnasal drip, rash, sore throat, sweats or weight loss. The symptoms are aggravated by pollens. She has tried a beta-agonist inhaler (levaquin) for the symptoms. The treatment provided moderate relief. There is no history of asthma, bronchiectasis, bronchitis, COPD, emphysema, environmental allergies or pneumonia.    No problem-specific Assessment & Plan notes found for this encounter.   Past Medical History:  Diagnosis Date  . Allergy   . COPD (chronic obstructive pulmonary disease) ([place])   . Depression   . Dysrhythmia    Complete Heart Block  . Gastritis, chronic   . GERD (gastroesophageal reflux disease)   . Graves disease   . History of hiatal hernia   . Hyperlipidemia   . Hypertension   . Hypothyroidism   . Presence of permanent cardiac pacemaker    2012    Past Surgical History:  Procedure Laterality Date  . APPENDECTOMY    . COLONOSCOPY  2000  . COLONOSCOPY WITH PROPOFOL N/A 04/26/2015   Procedure: COLONOSCOPY WITH PROPOFOL;  Surgeon: Hulen Luster, MD;  Location: [facility] ENDOSCOPY;  Service: Gastroenterology;  Laterality: N/A;  . fibroid tumors     fingers and wrist  . FOOT SURGERY    . TONSILLECTOMY    . VAGINAL HYSTERECTOMY      No family history on file.  Social History   Social History  . Marital  status: Married    Spouse name: N/A  . Number of children: N/A  . Years of education: N/A   Occupational History  . Not on file.   Social History Main Topics  . Smoking status: Former Research scientist (life sciences)  . Smokeless tobacco: Never Used  . Alcohol use 0.0 oz/week  . Drug use: No  . Sexual activity: Yes   Other Topics Concern  . Not on file   Social History Narrative  . No narrative on file    Allergies  Allergen Reactions  . Penicillins Other (See Comments)    Has patient had a PCN reaction causing immediate rash, facial/tongue/throat swelling, SOB or lightheadedness with hypotension: No Has patient had a PCN reaction causing severe rash involving mucus membranes or skin necrosis: No Has patient had a PCN reaction that required hospitalization No Has patient had a PCN reaction occurring within the last 10 years: No If all of the above answers are "NO", then may proceed with Cephalosporin use.     Outpatient Medications Prior to Visit  Medication Sig Dispense Refill  . albuterol (PROVENTIL HFA;VENTOLIN HFA) 108 (90 Base) MCG/ACT inhaler Inhale 1-2 puffs into the lungs every 6 (six) hours as needed for wheezing or shortness of breath. 1 Inhaler 11  . albuterol (PROVENTIL) (2.5 MG/3ML) 0.083% nebulizer solution Take 3 mLs (2.5 mg total) by nebulization every 6 (six) hours as needed for wheezing or shortness of breath. 75 mL 12  . atorvastatin (  LIPITOR) 20 MG tablet TAKE 1 TABLET BY MOUTH DAILY. (CALL AND SCHEDULE APPT) 30 tablet 1  . beclomethasone (QVAR) 80 MCG/ACT inhaler Inhale 1 puff into the lungs 2 (two) times daily. 1 Inhaler 11  . citalopram (CELEXA) 40 MG tablet TAKE 1 TABLET (40 MG TOTAL) BY MOUTH DAILY. 30 tablet 0  . gemfibrozil (LOPID) 600 MG tablet TAKE 1 TABLET (600 MG TOTAL) BY MOUTH DAILY. 90 tablet 0  . levothyroxine (SYNTHROID, LEVOTHROID) 112 MCG tablet TAKE 1 TABLET EVERY DAY 30 tablet 0  . loratadine (CLARITIN) 10 MG tablet TAKE 1 TABLET (10 MG TOTAL) BY MOUTH DAILY.  30 tablet 5  . losartan (COZAAR) 50 MG tablet Take 1 tablet (50 mg total) by mouth daily. 90 tablet 1  . metoprolol succinate (TOPROL-XL) 50 MG 24 hr tablet TAKE 1 TABLET (50 MG TOTAL) BY MOUTH DAILY. 30 tablet 0  . montelukast (SINGULAIR) 10 MG tablet Take 1 tablet (10 mg total) by mouth at bedtime. 30 tablet 3  . Multiple Vitamins-Minerals (CENTRUM SILVER PO) Take 1 tablet by mouth daily.    . Omega-3 Fatty Acids (FISH OIL) 1000 MG CAPS Take 1 capsule by mouth daily.    . rivaroxaban (XARELTO) 20 MG TABS tablet Take 1 tablet by mouth daily. Dr Allena E[facility] at [facility]    . simvastatin (ZOCOR) 20 MG tablet TAKE 1 TABLET BY MOUTH EVERY DAY 90 tablet 0  . guaiFENesin-codeine (ROBITUSSIN AC) 100-10 MG/5ML syrup Take 5 mLs by mouth 3 (three) times daily as needed for cough. 150 mL 0  . levofloxacin (LEVAQUIN) 500 MG tablet Take 1 tablet (500 mg total) by mouth daily. 7 tablet 0   Facility-Administered Medications Prior to Visit  Medication Dose Route Frequency Provider Last Rate Last Dose  . albuterol (PROVENTIL) (2.5 MG/3ML) 0.083% nebulizer solution 2.5 mg  2.5 mg Nebulization Once Juline Patch, MD        Review of Systems  Constitutional: Positive for chills and malaise/fatigue. Negative for fever and weight loss.  HENT: Positive for rhinorrhea. Negative for ear discharge, ear pain, postnasal drip and sore throat.   Eyes: Negative for blurred vision.  Respiratory: Positive for cough, shortness of breath and wheezing. Negative for hemoptysis and sputum production.   Cardiovascular: Negative for chest pain, palpitations and leg swelling.  Gastrointestinal: Negative for abdominal pain, blood in stool, constipation, diarrhea, heartburn, melena and nausea.  Genitourinary: Negative for dysuria, frequency, hematuria and urgency.  Musculoskeletal: Positive for myalgias. Negative for back pain, joint pain and neck pain.  Skin: Negative for rash.  Neurological: Negative for dizziness, tingling, sensory  change, focal weakness and headaches.  Endo/Heme/Allergies: Negative for environmental allergies and polydipsia. Does not bruise/bleed easily.  Psychiatric/Behavioral: Negative for depression and suicidal ideas. The patient is not nervous/anxious and does not have insomnia.      Objective  Vitals:   11/23/16 0913  BP: (!) 142/100  Pulse: 98  Temp: 98.1 F (36.7 C)  TempSrc: Oral  SpO2: 91%  Weight: 203 lb (92.1 kg)  Height: 5\' 4"  (1.626 m)    Physical Exam  Constitutional: She is well-developed, well-nourished, and in no distress. No distress.  HENT:  Head: Normocephalic and atraumatic.  Right Ear: External ear normal.  Left Ear: External ear normal.  Nose: Nose normal.  Mouth/Throat: Oropharynx is clear and moist.  Eyes: Conjunctivae and EOM are normal. Pupils are equal, round, and reactive to light. Right eye exhibits no discharge. Left eye exhibits no discharge.  Neck: Normal range  of motion. Neck supple. No JVD present. No thyromegaly present.  Cardiovascular: Normal rate, regular rhythm, normal heart sounds and intact distal pulses.  Exam reveals no gallop and no friction rub.   No murmur heard. Pulmonary/Chest: Effort normal and breath sounds normal. She has no wheezes. She has no rales.  Abdominal: Soft. Bowel sounds are normal. She exhibits no mass. There is no tenderness. There is no guarding.  Musculoskeletal: Normal range of motion. She exhibits no edema.  Lymphadenopathy:    She has no cervical adenopathy.  Neurological: She is alert. She has normal reflexes.  Skin: Skin is warm and dry. She is not diaphoretic.  Psychiatric: Mood and affect normal.  Nursing note and vitals reviewed.     Assessment & Plan  Problem List Items Addressed This Visit      Respiratory   Reactive airway disease   Relevant Medications   predniSONE (DELTASONE) 10 MG tablet    Other Visit Diagnoses    Bronchiolitis    -  Primary   Relevant Medications   guaiFENesin-codeine  (ROBITUSSIN AC) 100-10 MG/5ML syrup   azithromycin (ZITHROMAX) 250 MG tablet   Chills without fever       Relevant Orders   POCT Influenza A/B (Completed)   Obstructive chronic bronchitis without exacerbation (HCC)       Relevant Medications   guaiFENesin-codeine (ROBITUSSIN AC) 100-10 MG/5ML syrup   predniSONE (DELTASONE) 10 MG tablet   azithromycin (ZITHROMAX) 250 MG tablet   Acute bronchitis with COPD ([place])       Relevant Medications   guaiFENesin-codeine (ROBITUSSIN AC) 100-10 MG/5ML syrup   predniSONE (DELTASONE) 10 MG tablet   azithromycin (ZITHROMAX) 250 MG tablet      Meds ordered this encounter  Medications  . guaiFENesin-codeine (ROBITUSSIN AC) 100-10 MG/5ML syrup    Sig: Take 5 mLs by mouth 3 (three) times daily as needed for cough.    Dispense:  150 mL    Refill:  0  . predniSONE (DELTASONE) 10 MG tablet    Sig: Take 1 tablet (10 mg total) by mouth daily with breakfast.    Dispense:  30 tablet    Refill:  0  . azithromycin (ZITHROMAX) 250 MG tablet    Sig: 2 today then 1 a day for 4 days    Dispense:  6 tablet    Refill:  0      Dr. Deanna Jones [facility]  11/23/16

## 2016-12-01 ENCOUNTER — Other Ambulatory Visit: Payer: Self-pay | Admitting: Family Medicine

## 2016-12-01 DIAGNOSIS — I10 Essential (primary) hypertension: Secondary | ICD-10-CM

## 2016-12-04 ENCOUNTER — Other Ambulatory Visit: Payer: Self-pay | Admitting: Family Medicine

## 2016-12-04 DIAGNOSIS — E039 Hypothyroidism, unspecified: Secondary | ICD-10-CM

## 2016-12-15 DIAGNOSIS — S9031XA Contusion of right foot, initial encounter: Secondary | ICD-10-CM | POA: Diagnosis not present

## 2016-12-15 DIAGNOSIS — M79671 Pain in right foot: Secondary | ICD-10-CM | POA: Diagnosis not present

## 2016-12-15 DIAGNOSIS — M84374A Stress fracture, right foot, initial encounter for fracture: Secondary | ICD-10-CM | POA: Diagnosis not present

## 2016-12-16 ENCOUNTER — Other Ambulatory Visit: Payer: Self-pay | Admitting: Family Medicine

## 2016-12-16 DIAGNOSIS — E039 Hypothyroidism, unspecified: Secondary | ICD-10-CM

## 2016-12-29 DIAGNOSIS — M79671 Pain in right foot: Secondary | ICD-10-CM | POA: Diagnosis not present

## 2016-12-29 DIAGNOSIS — S9031XA Contusion of right foot, initial encounter: Secondary | ICD-10-CM | POA: Diagnosis not present

## 2016-12-29 DIAGNOSIS — M722 Plantar fascial fibromatosis: Secondary | ICD-10-CM | POA: Diagnosis not present

## 2016-12-30 ENCOUNTER — Other Ambulatory Visit: Payer: Self-pay | Admitting: Family Medicine

## 2016-12-30 DIAGNOSIS — E039 Hypothyroidism, unspecified: Secondary | ICD-10-CM

## 2016-12-30 DIAGNOSIS — I10 Essential (primary) hypertension: Secondary | ICD-10-CM

## 2017-01-06 DIAGNOSIS — Z950 Presence of cardiac pacemaker: Secondary | ICD-10-CM | POA: Insufficient documentation

## 2017-01-06 DIAGNOSIS — Z45018 Encounter for adjustment and management of other part of cardiac pacemaker: Secondary | ICD-10-CM | POA: Diagnosis not present

## 2017-01-07 ENCOUNTER — Other Ambulatory Visit: Payer: Self-pay | Admitting: Family Medicine

## 2017-01-07 DIAGNOSIS — J209 Acute bronchitis, unspecified: Secondary | ICD-10-CM

## 2017-01-07 DIAGNOSIS — J440 Chronic obstructive pulmonary disease with acute lower respiratory infection: Secondary | ICD-10-CM

## 2017-01-07 DIAGNOSIS — J4521 Mild intermittent asthma with (acute) exacerbation: Secondary | ICD-10-CM

## 2017-01-07 DIAGNOSIS — J449 Chronic obstructive pulmonary disease, unspecified: Secondary | ICD-10-CM

## 2017-01-10 ENCOUNTER — Other Ambulatory Visit: Payer: Self-pay | Admitting: Family Medicine

## 2017-01-10 DIAGNOSIS — E7849 Other hyperlipidemia: Secondary | ICD-10-CM

## 2017-01-10 DIAGNOSIS — J4489 Other specified chronic obstructive pulmonary disease: Secondary | ICD-10-CM

## 2017-01-10 DIAGNOSIS — J449 Chronic obstructive pulmonary disease, unspecified: Secondary | ICD-10-CM

## 2017-01-10 DIAGNOSIS — J209 Acute bronchitis, unspecified: Secondary | ICD-10-CM

## 2017-01-10 DIAGNOSIS — J4521 Mild intermittent asthma with (acute) exacerbation: Secondary | ICD-10-CM

## 2017-01-10 DIAGNOSIS — J440 Chronic obstructive pulmonary disease with acute lower respiratory infection: Secondary | ICD-10-CM

## 2017-01-10 DIAGNOSIS — E785 Hyperlipidemia, unspecified: Secondary | ICD-10-CM

## 2017-01-11 ENCOUNTER — Other Ambulatory Visit: Payer: Self-pay | Admitting: Family Medicine

## 2017-01-11 DIAGNOSIS — E039 Hypothyroidism, unspecified: Secondary | ICD-10-CM

## 2017-01-12 ENCOUNTER — Other Ambulatory Visit: Payer: Self-pay

## 2017-01-14 ENCOUNTER — Other Ambulatory Visit: Payer: Self-pay | Admitting: Family Medicine

## 2017-01-16 ENCOUNTER — Other Ambulatory Visit: Payer: Self-pay | Admitting: Family Medicine

## 2017-01-25 ENCOUNTER — Other Ambulatory Visit: Payer: Self-pay | Admitting: Family Medicine

## 2017-01-26 DIAGNOSIS — M9902 Segmental and somatic dysfunction of thoracic region: Secondary | ICD-10-CM | POA: Diagnosis not present

## 2017-01-26 DIAGNOSIS — M9903 Segmental and somatic dysfunction of lumbar region: Secondary | ICD-10-CM | POA: Diagnosis not present

## 2017-01-26 DIAGNOSIS — M9904 Segmental and somatic dysfunction of sacral region: Secondary | ICD-10-CM | POA: Diagnosis not present

## 2017-01-30 ENCOUNTER — Other Ambulatory Visit: Payer: Self-pay | Admitting: Family Medicine

## 2017-01-30 DIAGNOSIS — I10 Essential (primary) hypertension: Secondary | ICD-10-CM

## 2017-01-30 DIAGNOSIS — E039 Hypothyroidism, unspecified: Secondary | ICD-10-CM

## 2017-02-04 ENCOUNTER — Other Ambulatory Visit: Payer: Self-pay

## 2017-02-04 ENCOUNTER — Telehealth: Payer: Self-pay | Admitting: Family Medicine

## 2017-02-04 NOTE — Telephone Encounter (Signed)
Patient is scheduled to come in 10:30 June 18.

## 2017-02-08 ENCOUNTER — Ambulatory Visit (INDEPENDENT_AMBULATORY_CARE_PROVIDER_SITE_OTHER): Payer: Medicare Other | Admitting: Family Medicine

## 2017-02-08 ENCOUNTER — Other Ambulatory Visit: Payer: Self-pay | Admitting: Family Medicine

## 2017-02-08 ENCOUNTER — Encounter: Payer: Self-pay | Admitting: Family Medicine

## 2017-02-08 DIAGNOSIS — E7849 Other hyperlipidemia: Secondary | ICD-10-CM

## 2017-02-08 DIAGNOSIS — J4520 Mild intermittent asthma, uncomplicated: Secondary | ICD-10-CM | POA: Diagnosis not present

## 2017-02-08 DIAGNOSIS — R059 Cough, unspecified: Secondary | ICD-10-CM

## 2017-02-08 DIAGNOSIS — J209 Acute bronchitis, unspecified: Secondary | ICD-10-CM

## 2017-02-08 DIAGNOSIS — J449 Chronic obstructive pulmonary disease, unspecified: Secondary | ICD-10-CM | POA: Diagnosis not present

## 2017-02-08 DIAGNOSIS — R05 Cough: Secondary | ICD-10-CM | POA: Diagnosis not present

## 2017-02-08 DIAGNOSIS — E039 Hypothyroidism, unspecified: Secondary | ICD-10-CM

## 2017-02-08 DIAGNOSIS — I10 Essential (primary) hypertension: Secondary | ICD-10-CM

## 2017-02-08 DIAGNOSIS — E784 Other hyperlipidemia: Secondary | ICD-10-CM

## 2017-02-08 DIAGNOSIS — J301 Allergic rhinitis due to pollen: Secondary | ICD-10-CM | POA: Diagnosis not present

## 2017-02-08 DIAGNOSIS — E782 Mixed hyperlipidemia: Secondary | ICD-10-CM

## 2017-02-08 MED ORDER — ALBUTEROL SULFATE HFA 108 (90 BASE) MCG/ACT IN AERS: 1.0000 | INHALATION_SPRAY | Freq: Four times a day (QID) | RESPIRATORY_TRACT | 11 refills | Status: DC | PRN

## 2017-02-08 MED ORDER — LEVOTHYROXINE SODIUM 112 MCG PO TABS: ORAL_TABLET | ORAL | 6 refills | Status: DC

## 2017-02-08 MED ORDER — LOSARTAN POTASSIUM 50 MG PO TABS: 50.0000 mg | ORAL_TABLET | Freq: Every day | ORAL | 1 refills | Status: DC

## 2017-02-08 MED ORDER — ATORVASTATIN CALCIUM 20 MG PO TABS: ORAL_TABLET | ORAL | 6 refills | Status: DC

## 2017-02-08 MED ORDER — ALBUTEROL SULFATE (2.5 MG/3ML) 0.083% IN NEBU: 2.5000 mg | INHALATION_SOLUTION | Freq: Four times a day (QID) | RESPIRATORY_TRACT | 12 refills | Status: DC | PRN

## 2017-02-08 MED ORDER — GEMFIBROZIL 600 MG PO TABS: 600.0000 mg | ORAL_TABLET | Freq: Every day | ORAL | 6 refills | Status: DC

## 2017-02-08 MED ORDER — MONTELUKAST SODIUM 10 MG PO TABS: 10.0000 mg | ORAL_TABLET | Freq: Every day | ORAL | 3 refills | Status: DC

## 2017-02-08 MED ORDER — FISH OIL 1000 MG PO CAPS: 1.0000 | ORAL_CAPSULE | Freq: Every day | ORAL | 3 refills | Status: DC

## 2017-02-08 MED ORDER — BECLOMETHASONE DIPROPIONATE 80 MCG/ACT IN AERS: 1.0000 | INHALATION_SPRAY | Freq: Two times a day (BID) | RESPIRATORY_TRACT | 11 refills | Status: DC

## 2017-02-08 MED ORDER — LORATADINE 10 MG PO TABS: 10.0000 mg | ORAL_TABLET | Freq: Every day | ORAL | 11 refills | Status: DC

## 2017-02-08 MED ORDER — METOPROLOL SUCCINATE ER 50 MG PO TB24: ORAL_TABLET | ORAL | 6 refills | Status: DC

## 2017-02-08 MED ORDER — CITALOPRAM HYDROBROMIDE 40 MG PO TABS: ORAL_TABLET | ORAL | 6 refills | Status: DC

## 2017-02-08 NOTE — Progress Notes (Signed)
Name: Danielle Taylor   MRN: [id number]    DOB: 1946-08-06   Date:02/08/2017       Progress Note  Subjective  Chief Complaint  Chief Complaint  Patient presents with  . Hypertension  . Hypothyroidism  . Allergic Rhinitis   . Asthma  . Hyperlipidemia    Hypertension  This is a chronic problem. The current episode started more than 1 year ago. The problem has been gradually improving since onset. The problem is controlled. Pertinent negatives include no anxiety, blurred vision, chest pain, headaches, malaise/fatigue, neck pain, orthopnea, palpitations, peripheral edema, PND, shortness of breath or sweats. There are no associated agents to hypertension. Risk factors for coronary artery disease include dyslipidemia. Past treatments include angiotensin blockers and beta blockers. The current treatment provides moderate improvement. There are no compliance problems.  There is no history of angina, kidney disease, CAD/MI, CVA, heart failure, left ventricular hypertrophy, PVD or retinopathy. Identifiable causes of hypertension include a thyroid problem. There is no history of chronic renal disease, a hypertension causing med or renovascular disease.  Asthma  She complains of hemoptysis, sputum production and wheezing. There is no chest tightness, cough, difficulty breathing, frequent throat clearing, hoarse voice or shortness of breath. This is a chronic problem. The current episode started more than 1 year ago. The problem occurs intermittently. The problem has been gradually improving. Associated symptoms include nasal congestion and rhinorrhea. Pertinent negatives include no appetite change, chest pain, dyspnea on exertion, ear congestion, ear pain, fever, headaches, heartburn, malaise/fatigue, myalgias, orthopnea, PND, postnasal drip, sneezing, sore throat, sweats, trouble swallowing or weight loss. Exacerbated by: home setting/none at beach. Her symptoms are alleviated by diuretics, OTC inhaler,  ipratropium, beta-agonist and steroid inhaler. She reports moderate improvement on treatment. Her past medical history is significant for asthma. There is no history of bronchiectasis, bronchitis, COPD, emphysema or pneumonia.  Hyperlipidemia  This is a chronic problem. The current episode started more than 1 year ago. The problem is controlled. Recent lipid tests were reviewed and are normal. Exacerbating diseases include hypothyroidism. She has no history of chronic renal disease or obesity. Factors aggravating her hyperlipidemia include thiazides. Pertinent negatives include no chest pain, focal weakness, leg pain, myalgias or shortness of breath. Current antihyperlipidemic treatment includes statins. There are no compliance problems.  Risk factors for coronary artery disease include hypertension and dyslipidemia.  Thyroid Problem  Presents for follow-up visit. Symptoms include weight gain. Patient reports no anxiety, constipation, depressed mood, diarrhea, dry skin, hair loss, hoarse voice, palpitations or weight loss. The symptoms have been stable. Her past medical history is significant for hyperlipidemia. There is no history of heart failure.    No problem-specific Assessment & Plan notes found for this encounter.   Past Medical History:  Diagnosis Date  . Allergy   . COPD (chronic obstructive pulmonary disease) ([place])   . Depression   . Dysrhythmia    Complete Heart Block  . Gastritis, chronic   . GERD (gastroesophageal reflux disease)   . Graves disease   . History of hiatal hernia   . Hyperlipidemia   . Hypertension   . Hypothyroidism   . Presence of permanent cardiac pacemaker    2012    Past Surgical History:  Procedure Laterality Date  . APPENDECTOMY    . COLONOSCOPY  2000  . COLONOSCOPY WITH PROPOFOL N/A 04/26/2015   Procedure: COLONOSCOPY WITH PROPOFOL;  Surgeon: Hulen Luster, MD;  Location: [facility] ENDOSCOPY;  Service: Gastroenterology;  Laterality: N/A;  .  fibroid tumors      fingers and wrist  . FOOT SURGERY    . TONSILLECTOMY    . VAGINAL HYSTERECTOMY      No family history on file.  Social History   Social History  . Marital status: Married    Spouse name: N/A  . Number of children: N/A  . Years of education: N/A   Occupational History  . Not on file.   Social History Main Topics  . Smoking status: Former Research scientist (life sciences)  . Smokeless tobacco: Never Used  . Alcohol use 0.0 oz/week  . Drug use: No  . Sexual activity: Yes   Other Topics Concern  . Not on file   Social History Narrative  . No narrative on file    Allergies  Allergen Reactions  . Penicillins Other (See Comments)    Has patient had a PCN reaction causing immediate rash, facial/tongue/throat swelling, SOB or lightheadedness with hypotension: No Has patient had a PCN reaction causing severe rash involving mucus membranes or skin necrosis: No Has patient had a PCN reaction that required hospitalization No Has patient had a PCN reaction occurring within the last 10 years: No If all of the above answers are "NO", then may proceed with Cephalosporin use.     Outpatient Medications Prior to Visit  Medication Sig Dispense Refill  . Multiple Vitamins-Minerals (CENTRUM SILVER PO) Take 1 tablet by mouth daily.    . rivaroxaban (XARELTO) 20 MG TABS tablet Take 1 tablet by mouth daily. Dr Allena Earing at [facility]    . albuterol (PROVENTIL HFA;VENTOLIN HFA) 108 (90 Base) MCG/ACT inhaler Inhale 1-2 puffs into the lungs every 6 (six) hours as needed for wheezing or shortness of breath. 1 Inhaler 11  . albuterol (PROVENTIL) (2.5 MG/3ML) 0.083% nebulizer solution Take 3 mLs (2.5 mg total) by nebulization every 6 (six) hours as needed for wheezing or shortness of breath. 75 mL 12  . atorvastatin (LIPITOR) 20 MG tablet TAKE 1 TABLET BY MOUTH DAILY. (CALL AND SCHEDULE APPT) 30 tablet 1  . beclomethasone (QVAR) 80 MCG/ACT inhaler Inhale 1 puff into the lungs 2 (two) times daily. 1 Inhaler 11  . citalopram  (CELEXA) 40 MG tablet TAKE 1 TABLET (40 MG TOTAL) BY MOUTH DAILY. 30 tablet 0  . gemfibrozil (LOPID) 600 MG tablet TAKE 1 TABLET (600 MG TOTAL) BY MOUTH DAILY. 30 tablet 0  . levothyroxine (SYNTHROID, LEVOTHROID) 112 MCG tablet *CALL & SCHEDULE APPT FOR MEDS** TAKE 1 TABLET EVERY DAY 7 tablet 0  . loratadine (CLARITIN) 10 MG tablet TAKE 1 TABLET (10 MG TOTAL) BY MOUTH DAILY. 30 tablet 0  . losartan (COZAAR) 50 MG tablet Take 1 tablet (50 mg total) by mouth daily. 90 tablet 1  . metoprolol succinate (TOPROL-XL) 50 MG 24 hr tablet TAKE 1 TABLET (50 MG TOTAL) BY MOUTH DAILY. *NEED TO SCHEDULE APPT FOR MEDS AND LABS** 15 tablet 0  . montelukast (SINGULAIR) 10 MG tablet Take 1 tablet (10 mg total) by mouth at bedtime. 30 tablet 3  . Omega-3 Fatty Acids (FISH OIL) 1000 MG CAPS Take 1 capsule by mouth daily.    Marland Kitchen azithromycin (ZITHROMAX) 250 MG tablet 2 today then 1 a day for 4 days 6 tablet 0  . guaiFENesin-codeine (ROBITUSSIN AC) 100-10 MG/5ML syrup Take 5 mLs by mouth 3 (three) times daily as needed for cough. 150 mL 0  . predniSONE (DELTASONE) 10 MG tablet Take 1 tablet (10 mg total) by mouth daily with breakfast. 30 tablet 0  .  simvastatin (ZOCOR) 20 MG tablet TAKE 1 TABLET BY MOUTH EVERY DAY 30 tablet 0   Facility-Administered Medications Prior to Visit  Medication Dose Route Frequency Provider Last Rate Last Dose  . albuterol (PROVENTIL) (2.5 MG/3ML) 0.083% nebulizer solution 2.5 mg  2.5 mg Nebulization Once Juline Patch, MD        Review of Systems  Constitutional: Positive for weight gain. Negative for appetite change, chills, fever, malaise/fatigue and weight loss.  HENT: Positive for rhinorrhea. Negative for ear discharge, ear pain, hoarse voice, postnasal drip, sneezing, sore throat and trouble swallowing.   Eyes: Negative for blurred vision.  Respiratory: Positive for hemoptysis, sputum production and wheezing. Negative for cough and shortness of breath.   Cardiovascular: Negative for  chest pain, dyspnea on exertion, palpitations, orthopnea, leg swelling and PND.  Gastrointestinal: Negative for abdominal pain, blood in stool, constipation, diarrhea, heartburn, melena and nausea.  Genitourinary: Negative for dysuria, frequency, hematuria and urgency.  Musculoskeletal: Negative for back pain, joint pain, myalgias and neck pain.  Skin: Negative for rash.  Neurological: Negative for dizziness, tingling, sensory change, focal weakness and headaches.  Endo/Heme/Allergies: Negative for environmental allergies and polydipsia. Does not bruise/bleed easily.  Psychiatric/Behavioral: Negative for depression and suicidal ideas. The patient is not nervous/anxious and does not have insomnia.      Objective  Vitals:   02/08/17 1042  BP: 120/80  Pulse: 64  Weight: 206 lb (93.4 kg)  Height: 5\' 4"  (1.626 m)    Physical Exam  Constitutional: She is well-developed, well-nourished, and in no distress. No distress.  HENT:  Head: Normocephalic and atraumatic.  Right Ear: External ear normal.  Left Ear: External ear normal.  Nose: Nose normal.  Mouth/Throat: Oropharynx is clear and moist.  Eyes: Conjunctivae and EOM are normal. Pupils are equal, round, and reactive to light. Right eye exhibits no discharge. Left eye exhibits no discharge.  Neck: Normal range of motion. Neck supple. No JVD present. No thyromegaly present.  Cardiovascular: Normal rate, regular rhythm, normal heart sounds and intact distal pulses.  Exam reveals no gallop and no friction rub.   No murmur heard. Pulmonary/Chest: Effort normal and breath sounds normal. She has no wheezes. She has no rales.  Abdominal: Soft. Bowel sounds are normal. She exhibits no mass. There is no tenderness. There is no guarding.  Musculoskeletal: Normal range of motion. She exhibits no edema.  Lymphadenopathy:    She has no cervical adenopathy.  Neurological: She is alert. She has normal reflexes.  Skin: Skin is warm and dry. She is  not diaphoretic.  Psychiatric: Mood and affect normal.  Nursing note and vitals reviewed.     Assessment & Plan  Problem List Items Addressed This Visit      Cardiovascular and Mediastinum   Essential (primary) hypertension   Relevant Medications   atorvastatin (LIPITOR) 20 MG tablet   gemfibrozil (LOPID) 600 MG tablet   metoprolol succinate (TOPROL-XL) 50 MG 24 hr tablet   losartan (COZAAR) 50 MG tablet   Other Relevant Orders   Renal Function Panel     Respiratory   Acute bronchitis   Relevant Medications   albuterol (PROVENTIL HFA;VENTOLIN HFA) 108 (90 Base) MCG/ACT inhaler   beclomethasone (QVAR) 80 MCG/ACT inhaler     Endocrine   Hypothyroid   Relevant Medications   levothyroxine (SYNTHROID, LEVOTHROID) 112 MCG tablet   metoprolol succinate (TOPROL-XL) 50 MG 24 hr tablet   Other Relevant Orders   TSH     Other  Familial multiple lipoprotein-type hyperlipidemia   Relevant Medications   atorvastatin (LIPITOR) 20 MG tablet   gemfibrozil (LOPID) 600 MG tablet   metoprolol succinate (TOPROL-XL) 50 MG 24 hr tablet   losartan (COZAAR) 50 MG tablet    Other Visit Diagnoses    Mixed hyperlipidemia       Relevant Medications   atorvastatin (LIPITOR) 20 MG tablet   gemfibrozil (LOPID) 600 MG tablet   metoprolol succinate (TOPROL-XL) 50 MG 24 hr tablet   losartan (COZAAR) 50 MG tablet   Other Relevant Orders   Lipid Profile   Cough       Relevant Medications   losartan (COZAAR) 50 MG tablet   Reactive airway disease, mild intermittent, uncomplicated       Relevant Medications   montelukast (SINGULAIR) 10 MG tablet   albuterol (PROVENTIL) (2.5 MG/3ML) 0.083% nebulizer solution   albuterol (PROVENTIL HFA;VENTOLIN HFA) 108 (90 Base) MCG/ACT inhaler   beclomethasone (QVAR) 80 MCG/ACT inhaler   Chronic seasonal allergic rhinitis due to pollen       Relevant Medications   montelukast (SINGULAIR) 10 MG tablet   Chronic obstructive pulmonary disease, unspecified COPD  type (HCC)       sample bevespi   Relevant Medications   loratadine (CLARITIN) 10 MG tablet   montelukast (SINGULAIR) 10 MG tablet   albuterol (PROVENTIL) (2.5 MG/3ML) 0.083% nebulizer solution   albuterol (PROVENTIL HFA;VENTOLIN HFA) 108 (90 Base) MCG/ACT inhaler   beclomethasone (QVAR) 80 MCG/ACT inhaler      Meds ordered this encounter  Medications  . atorvastatin (LIPITOR) 20 MG tablet    Sig: TAKE 1 TABLET BY MOUTH DAILY.    Dispense:  30 tablet    Refill:  6  . citalopram (CELEXA) 40 MG tablet    Sig: TAKE 1 TABLET (40 MG TOTAL) BY MOUTH DAILY.    Dispense:  30 tablet    Refill:  6  . loratadine (CLARITIN) 10 MG tablet    Sig: Take 1 tablet (10 mg total) by mouth daily.    Dispense:  30 tablet    Refill:  11  . Omega-3 Fatty Acids (FISH OIL) 1000 MG CAPS    Sig: Take 1 capsule (1,000 mg total) by mouth daily.    Dispense:  100 capsule    Refill:  3  . levothyroxine (SYNTHROID, LEVOTHROID) 112 MCG tablet    Sig: *CALL & SCHEDULE APPT FOR MEDS** TAKE 1 TABLET EVERY DAY    Dispense:  30 tablet    Refill:  6  . gemfibrozil (LOPID) 600 MG tablet    Sig: Take 1 tablet (600 mg total) by mouth daily.    Dispense:  30 tablet    Refill:  6    Needs appt for meds  . metoprolol succinate (TOPROL-XL) 50 MG 24 hr tablet    Sig: TAKE 1 TABLET (50 MG TOTAL) BY MOUTH DAILY. *NEED TO SCHEDULE APPT FOR MEDS AND LABS**    Dispense:  30 tablet    Refill:  6  . losartan (COZAAR) 50 MG tablet    Sig: Take 1 tablet (50 mg total) by mouth daily.    Dispense:  90 tablet    Refill:  1  . montelukast (SINGULAIR) 10 MG tablet    Sig: Take 1 tablet (10 mg total) by mouth at bedtime.    Dispense:  30 tablet    Refill:  3  . albuterol (PROVENTIL) (2.5 MG/3ML) 0.083% nebulizer solution    Sig: Take 3 mLs (  2.5 mg total) by nebulization every 6 (six) hours as needed for wheezing or shortness of breath.    Dispense:  75 mL    Refill:  12  . albuterol (PROVENTIL HFA;VENTOLIN HFA) 108 (90 Base)  MCG/ACT inhaler    Sig: Inhale 1-2 puffs into the lungs every 6 (six) hours as needed for wheezing or shortness of breath.    Dispense:  1 Inhaler    Refill:  11  . beclomethasone (QVAR) 80 MCG/ACT inhaler    Sig: Inhale 1 puff into the lungs 2 (two) times daily.    Dispense:  1 Inhaler    Refill:  11      Dr. Macon [facility]  02/08/17

## 2017-02-09 LAB — RENAL FUNCTION PANEL
ALBUMIN: 4.4 g/dL (ref 3.6–4.8)
BUN/Creatinine Ratio: 19 (ref 12–28)
BUN: 15 mg/dL (ref 8–27)
CO2: 21 mmol/L (ref 20–29)
Calcium: 9.8 mg/dL (ref 8.7–10.3)
Chloride: 101 mmol/L (ref 96–106)
Creatinine, Ser: 0.81 mg/dL (ref 0.57–1.00)
GFR, EST AFRICAN AMERICAN: 86 mL/min/{1.73_m2} (ref >59)
GFR, EST NON AFRICAN AMERICAN: 75 mL/min/{1.73_m2} (ref >59)
Glucose: 80 mg/dL (ref 65–99)
Phosphorus: 3 mg/dL (ref 2.5–4.5)
Potassium: 4.1 mmol/L (ref 3.5–5.2)
Sodium: 141 mmol/L (ref 134–144)

## 2017-02-09 LAB — LIPID PANEL
CHOL/HDL RATIO: 3.5 ratio (ref 0.0–4.4)
Cholesterol, Total: 194 mg/dL (ref 100–199)
HDL: 56 mg/dL (ref >39)
LDL CALC: 100 mg/dL — AB (ref 0–99)
TRIGLYCERIDES: 188 mg/dL — AB (ref 0–149)
VLDL Cholesterol Cal: 38 mg/dL (ref 5–40)

## 2017-02-09 LAB — TSH: TSH: 3.16 u[IU]/mL (ref 0.450–4.500)

## 2017-02-18 DIAGNOSIS — R042 Hemoptysis: Secondary | ICD-10-CM | POA: Diagnosis not present

## 2017-02-18 DIAGNOSIS — J439 Emphysema, unspecified: Secondary | ICD-10-CM | POA: Diagnosis not present

## 2017-02-18 DIAGNOSIS — R05 Cough: Secondary | ICD-10-CM | POA: Diagnosis not present

## 2017-02-19 ENCOUNTER — Other Ambulatory Visit: Payer: Self-pay | Admitting: Family Medicine

## 2017-03-04 ENCOUNTER — Other Ambulatory Visit: Payer: Self-pay | Admitting: Specialist

## 2017-03-04 DIAGNOSIS — R05 Cough: Secondary | ICD-10-CM | POA: Diagnosis not present

## 2017-03-04 DIAGNOSIS — R042 Hemoptysis: Secondary | ICD-10-CM

## 2017-03-04 DIAGNOSIS — R0609 Other forms of dyspnea: Principal | ICD-10-CM

## 2017-03-04 DIAGNOSIS — R059 Cough, unspecified: Secondary | ICD-10-CM

## 2017-03-10 ENCOUNTER — Ambulatory Visit
Admission: RE | Admit: 2017-03-10 | Discharge: 2017-03-10 | Disposition: A | Discharge status: Home / Self Care | Payer: Medicare Other | Source: Ambulatory Visit | Attending: Specialist | Admitting: Specialist

## 2017-03-10 ENCOUNTER — Encounter (INDEPENDENT_AMBULATORY_CARE_PROVIDER_SITE_OTHER): Payer: Self-pay

## 2017-03-10 DIAGNOSIS — R0609 Other forms of dyspnea: Secondary | ICD-10-CM | POA: Insufficient documentation

## 2017-03-10 DIAGNOSIS — J432 Centrilobular emphysema: Secondary | ICD-10-CM | POA: Insufficient documentation

## 2017-03-10 DIAGNOSIS — I700 Atherosclerosis of aorta: Secondary | ICD-10-CM | POA: Diagnosis not present

## 2017-03-10 DIAGNOSIS — K760 Fatty (change of) liver, not elsewhere classified: Secondary | ICD-10-CM | POA: Insufficient documentation

## 2017-03-10 DIAGNOSIS — R05 Cough: Secondary | ICD-10-CM

## 2017-03-10 DIAGNOSIS — I2510 Atherosclerotic heart disease of native coronary artery without angina pectoris: Secondary | ICD-10-CM | POA: Insufficient documentation

## 2017-03-10 DIAGNOSIS — R918 Other nonspecific abnormal finding of lung field: Secondary | ICD-10-CM | POA: Insufficient documentation

## 2017-03-10 DIAGNOSIS — R042 Hemoptysis: Secondary | ICD-10-CM

## 2017-03-10 DIAGNOSIS — R059 Cough, unspecified: Secondary | ICD-10-CM

## 2017-03-10 DIAGNOSIS — R0600 Dyspnea, unspecified: Secondary | ICD-10-CM | POA: Diagnosis not present

## 2017-03-11 DIAGNOSIS — M9902 Segmental and somatic dysfunction of thoracic region: Secondary | ICD-10-CM | POA: Diagnosis not present

## 2017-03-11 DIAGNOSIS — M9904 Segmental and somatic dysfunction of sacral region: Secondary | ICD-10-CM | POA: Diagnosis not present

## 2017-03-11 DIAGNOSIS — M9903 Segmental and somatic dysfunction of lumbar region: Secondary | ICD-10-CM | POA: Diagnosis not present

## 2017-03-11 DIAGNOSIS — M5442 Lumbago with sciatica, left side: Secondary | ICD-10-CM | POA: Diagnosis not present

## 2017-03-11 DIAGNOSIS — M5441 Lumbago with sciatica, right side: Secondary | ICD-10-CM | POA: Diagnosis not present

## 2017-03-15 ENCOUNTER — Other Ambulatory Visit: Payer: Self-pay

## 2017-03-15 ENCOUNTER — Other Ambulatory Visit: Payer: Self-pay | Admitting: Family Medicine

## 2017-03-15 DIAGNOSIS — R0609 Other forms of dyspnea: Secondary | ICD-10-CM | POA: Diagnosis not present

## 2017-03-15 DIAGNOSIS — J84112 Idiopathic pulmonary fibrosis: Secondary | ICD-10-CM | POA: Diagnosis not present

## 2017-03-15 DIAGNOSIS — J439 Emphysema, unspecified: Secondary | ICD-10-CM | POA: Diagnosis not present

## 2017-03-15 DIAGNOSIS — R05 Cough: Secondary | ICD-10-CM | POA: Diagnosis not present

## 2017-03-21 ENCOUNTER — Other Ambulatory Visit: Payer: Self-pay | Admitting: Family Medicine

## 2017-03-23 DIAGNOSIS — M9904 Segmental and somatic dysfunction of sacral region: Secondary | ICD-10-CM | POA: Diagnosis not present

## 2017-03-23 DIAGNOSIS — M9903 Segmental and somatic dysfunction of lumbar region: Secondary | ICD-10-CM | POA: Diagnosis not present

## 2017-03-23 DIAGNOSIS — M9902 Segmental and somatic dysfunction of thoracic region: Secondary | ICD-10-CM | POA: Diagnosis not present

## 2017-04-01 DIAGNOSIS — Z45018 Encounter for adjustment and management of other part of cardiac pacemaker: Secondary | ICD-10-CM | POA: Diagnosis not present

## 2017-04-01 DIAGNOSIS — I498 Other specified cardiac arrhythmias: Secondary | ICD-10-CM | POA: Diagnosis not present

## 2017-04-01 DIAGNOSIS — Z950 Presence of cardiac pacemaker: Secondary | ICD-10-CM | POA: Diagnosis not present

## 2017-04-01 DIAGNOSIS — Z45010 Encounter for checking and testing of cardiac pacemaker pulse generator [battery]: Secondary | ICD-10-CM | POA: Diagnosis not present

## 2017-04-01 DIAGNOSIS — Z9989 Dependence on other enabling machines and devices: Secondary | ICD-10-CM | POA: Diagnosis not present

## 2017-04-02 ENCOUNTER — Ambulatory Visit
Admission: RE | Admit: 2017-04-02 | Discharge: 2017-04-02 | Disposition: A | Discharge status: Home / Self Care | Payer: Medicare Other | Source: Ambulatory Visit | Attending: Family Medicine | Admitting: Family Medicine

## 2017-04-02 ENCOUNTER — Encounter: Payer: Self-pay | Admitting: Family Medicine

## 2017-04-02 ENCOUNTER — Ambulatory Visit (INDEPENDENT_AMBULATORY_CARE_PROVIDER_SITE_OTHER): Payer: Medicare Other | Admitting: Family Medicine

## 2017-04-02 VITALS — BP 120/70 | HR 107 | Ht 64.0 in | Wt 207.0 lb

## 2017-04-02 DIAGNOSIS — M5136 Other intervertebral disc degeneration, lumbar region: Secondary | ICD-10-CM | POA: Diagnosis not present

## 2017-04-02 DIAGNOSIS — M519 Unspecified thoracic, thoracolumbar and lumbosacral intervertebral disc disorder: Secondary | ICD-10-CM | POA: Insufficient documentation

## 2017-04-02 DIAGNOSIS — K760 Fatty (change of) liver, not elsewhere classified: Secondary | ICD-10-CM

## 2017-04-02 DIAGNOSIS — J432 Centrilobular emphysema: Secondary | ICD-10-CM | POA: Diagnosis not present

## 2017-04-02 DIAGNOSIS — I2510 Atherosclerotic heart disease of native coronary artery without angina pectoris: Secondary | ICD-10-CM

## 2017-04-02 DIAGNOSIS — F339 Major depressive disorder, recurrent, unspecified: Secondary | ICD-10-CM

## 2017-04-02 DIAGNOSIS — M5137 Other intervertebral disc degeneration, lumbosacral region: Secondary | ICD-10-CM | POA: Diagnosis not present

## 2017-04-02 DIAGNOSIS — M549 Dorsalgia, unspecified: Secondary | ICD-10-CM | POA: Diagnosis present

## 2017-04-02 DIAGNOSIS — M545 Low back pain: Secondary | ICD-10-CM | POA: Diagnosis not present

## 2017-04-02 MED ORDER — AZITHROMYCIN 250 MG PO TABS: ORAL_TABLET | ORAL | 0 refills | Status: DC

## 2017-04-02 MED ORDER — IPRATROPIUM-ALBUTEROL 0.5-2.5 (3) MG/3ML IN SOLN: 3.0000 mL | Freq: Four times a day (QID) | RESPIRATORY_TRACT | Status: DC

## 2017-04-02 NOTE — Progress Notes (Signed)
Name: Danielle Taylor   MRN: [id number]    DOB: 07/08/1946   Date:04/02/2017       Progress Note  Subjective  Chief Complaint  Chief Complaint  Patient presents with  . Cough    had a CT scan on 03/04/17- cannot exclude pneumonia- now has green production and is hoarse. Going for second opinion Oct 23 at [facility]    Cough  This is a new problem. The current episode started in the past 7 days. The problem has been gradually improving. The problem occurs every few minutes. The cough is productive of purulent sputum. Associated symptoms include hemoptysis, nasal congestion, rhinorrhea, shortness of breath and wheezing. Pertinent negatives include no chest pain, chills, ear congestion, ear pain, fever, headaches, heartburn, myalgias, postnasal drip, rash, sore throat or weight loss. There is no history of environmental allergies.    No problem-specific Assessment & Plan notes found for this encounter.   Past Medical History:  Diagnosis Date  . Allergy   . COPD (chronic obstructive pulmonary disease) ([place])   . Depression   . Dysrhythmia    Complete Heart Block  . Gastritis, chronic   . GERD (gastroesophageal reflux disease)   . Graves disease   . History of hiatal hernia   . Hyperlipidemia   . Hypertension   . Hypothyroidism   . Presence of permanent cardiac pacemaker    2012    Past Surgical History:  Procedure Laterality Date  . APPENDECTOMY    . COLONOSCOPY  2000  . COLONOSCOPY WITH PROPOFOL N/A 04/26/2015   Procedure: COLONOSCOPY WITH PROPOFOL;  Surgeon: Hulen Luster, MD;  Location: [facility] ENDOSCOPY;  Service: Gastroenterology;  Laterality: N/A;  . fibroid tumors     fingers and wrist  . FOOT SURGERY    . TONSILLECTOMY    . VAGINAL HYSTERECTOMY      No family history on file.  Social History   Social History  . Marital status: Married    Spouse name: N/A  . Number of children: N/A  . Years of education: N/A   Occupational History  . Not on file.    Social History Main Topics  . Smoking status: Former Research scientist (life sciences)  . Smokeless tobacco: Never Used  . Alcohol use 0.0 oz/week  . Drug use: No  . Sexual activity: Yes   Other Topics Concern  . Not on file   Social History Narrative  . No narrative on file    Allergies  Allergen Reactions  . Penicillins Other (See Comments)    Has patient had a PCN reaction causing immediate rash, facial/tongue/throat swelling, SOB or lightheadedness with hypotension: No Has patient had a PCN reaction causing severe rash involving mucus membranes or skin necrosis: No Has patient had a PCN reaction that required hospitalization No Has patient had a PCN reaction occurring within the last 10 years: No If all of the above answers are "NO", then may proceed with Cephalosporin use.     Outpatient Medications Prior to Visit  Medication Sig Dispense Refill  . albuterol (PROVENTIL HFA;VENTOLIN HFA) 108 (90 Base) MCG/ACT inhaler Inhale 1-2 puffs into the lungs every 6 (six) hours as needed for wheezing or shortness of breath. 1 Inhaler 11  . albuterol (PROVENTIL) (2.5 MG/3ML) 0.083% nebulizer solution Take 3 mLs (2.5 mg total) by nebulization every 6 (six) hours as needed for wheezing or shortness of breath. 75 mL 12  . atorvastatin (LIPITOR) 20 MG tablet TAKE 1 TABLET BY MOUTH DAILY. 30 tablet  6  . beclomethasone (QVAR) 80 MCG/ACT inhaler Inhale 1 puff into the lungs 2 (two) times daily. 1 Inhaler 11  . citalopram (CELEXA) 40 MG tablet TAKE 1 TABLET (40 MG TOTAL) BY MOUTH DAILY. 30 tablet 6  . gemfibrozil (LOPID) 600 MG tablet Take 1 tablet (600 mg total) by mouth daily. 30 tablet 6  . levothyroxine (SYNTHROID, LEVOTHROID) 112 MCG tablet *CALL & SCHEDULE APPT FOR MEDS** TAKE 1 TABLET EVERY DAY 30 tablet 6  . loratadine (CLARITIN) 10 MG tablet Take 1 tablet (10 mg total) by mouth daily. 30 tablet 11  . losartan (COZAAR) 50 MG tablet Take 1 tablet (50 mg total) by mouth daily. 90 tablet 1  . metoprolol succinate  (TOPROL-XL) 50 MG 24 hr tablet TAKE 1 TABLET (50 MG TOTAL) BY MOUTH DAILY. *NEED TO SCHEDULE APPT FOR MEDS AND LABS** 30 tablet 6  . montelukast (SINGULAIR) 10 MG tablet Take 1 tablet (10 mg total) by mouth at bedtime. 30 tablet 3  . Multiple Vitamins-Minerals (CENTRUM SILVER PO) Take 1 tablet by mouth daily.    . Omega-3 Fatty Acids (FISH OIL) 1000 MG CAPS Take 1 capsule (1,000 mg total) by mouth daily. 100 capsule 3  . rivaroxaban (XARELTO) 20 MG TABS tablet Take 1 tablet by mouth daily. Dr Allena Earing at [facility]    . simvastatin (ZOCOR) 20 MG tablet TAKE 1 TABLET BY MOUTH EVERY DAY 30 tablet 4  . loratadine (CLARITIN) 10 MG tablet TAKE 1 TABLET (10 MG TOTAL) BY MOUTH DAILY. 30 tablet 2   Facility-Administered Medications Prior to Visit  Medication Dose Route Frequency Provider Last Rate Last Dose  . albuterol (PROVENTIL) (2.5 MG/3ML) 0.083% nebulizer solution 2.5 mg  2.5 mg Nebulization Once Juline Patch, MD        Review of Systems  Constitutional: Negative for chills, fever, malaise/fatigue and weight loss.  HENT: Positive for rhinorrhea. Negative for ear discharge, ear pain, postnasal drip and sore throat.   Eyes: Negative for blurred vision.  Respiratory: Positive for cough, hemoptysis, shortness of breath and wheezing. Negative for sputum production.   Cardiovascular: Negative for chest pain, palpitations and leg swelling.  Gastrointestinal: Negative for abdominal pain, blood in stool, constipation, diarrhea, heartburn, melena and nausea.  Genitourinary: Negative for dysuria, frequency, hematuria and urgency.  Musculoskeletal: Negative for back pain, joint pain, myalgias and neck pain.  Skin: Negative for rash.  Neurological: Negative for dizziness, tingling, sensory change, focal weakness and headaches.  Endo/Heme/Allergies: Negative for environmental allergies and polydipsia. Does not bruise/bleed easily.  Psychiatric/Behavioral: Negative for depression and suicidal ideas. The patient  is not nervous/anxious and does not have insomnia.      Objective  Vitals:   04/02/17 1013  BP: 120/70  Pulse: (!) 107  SpO2: 93%  Weight: 207 lb (93.9 kg)  Height: 5\' 4"  (1.626 m)    Physical Exam  Constitutional: She is well-developed, well-nourished, and in no distress. No distress.  HENT:  Head: Normocephalic and atraumatic.  Right Ear: External ear normal.  Left Ear: External ear normal.  Nose: Nose normal.  Mouth/Throat: Oropharynx is clear and moist.  Eyes: Pupils are equal, round, and reactive to light. Conjunctivae and EOM are normal. Right eye exhibits no discharge. Left eye exhibits no discharge.  Neck: Normal range of motion. Neck supple. No JVD present. No thyromegaly present.  Cardiovascular: Normal rate, regular rhythm, normal heart sounds and intact distal pulses.  Exam reveals no gallop and no friction rub.   No murmur heard.  Pulmonary/Chest: Effort normal. She has decreased breath sounds in the right lower field and the left lower field. She has wheezes. She has no rhonchi. She has no rales.  Abdominal: Soft. Bowel sounds are normal. She exhibits no mass. There is no tenderness. There is no guarding.  Musculoskeletal: Normal range of motion. She exhibits no edema.  Lymphadenopathy:    She has no cervical adenopathy.  Neurological: She is alert. She has normal reflexes.  Skin: Skin is warm and dry. She is not diaphoretic.  Psychiatric: Mood and affect normal.  Nursing note and vitals reviewed.     Assessment & Plan  Problem List Items Addressed This Visit      Cardiovascular and Mediastinum   Atherosclerosis of coronary artery of native heart     Respiratory   Centrilobular emphysema (HCC) - Primary   Relevant Medications   predniSONE (DELTASONE) 10 MG tablet   ipratropium-albuterol (DUONEB) 0.5-2.5 (3) MG/3ML nebulizer solution 3 mL (Start on 04/02/2017  2:00 PM)   azithromycin (ZITHROMAX) 250 MG tablet     Digestive   Hepatic steatosis      Musculoskeletal and Integument   Lumbar disc disease   Relevant Orders   DG Lumbar Spine Complete     Other   Recurrent major depressive episodes ([place])      Meds ordered this encounter  Medications  . ipratropium-albuterol (DUONEB) 0.5-2.5 (3) MG/3ML nebulizer solution 3 mL  . azithromycin (ZITHROMAX) 250 MG tablet    Sig: 2 today then 1 a ady for 4 days    Dispense:  6 tablet    Refill:  0      Dr. [facility]  04/02/17

## 2017-04-05 ENCOUNTER — Ambulatory Visit (INDEPENDENT_AMBULATORY_CARE_PROVIDER_SITE_OTHER): Payer: Medicare Other | Admitting: Family Medicine

## 2017-04-05 ENCOUNTER — Encounter: Payer: Self-pay | Admitting: Family Medicine

## 2017-04-05 VITALS — BP 120/60 | HR 95 | Ht 64.0 in | Wt 207.0 lb

## 2017-04-05 DIAGNOSIS — M519 Unspecified thoracic, thoracolumbar and lumbosacral intervertebral disc disorder: Secondary | ICD-10-CM | POA: Diagnosis not present

## 2017-04-05 DIAGNOSIS — R053 Chronic cough: Secondary | ICD-10-CM

## 2017-04-05 DIAGNOSIS — I2510 Atherosclerotic heart disease of native coronary artery without angina pectoris: Secondary | ICD-10-CM | POA: Diagnosis not present

## 2017-04-05 DIAGNOSIS — Z111 Encounter for screening for respiratory tuberculosis: Secondary | ICD-10-CM

## 2017-04-05 DIAGNOSIS — J209 Acute bronchitis, unspecified: Secondary | ICD-10-CM | POA: Diagnosis not present

## 2017-04-05 DIAGNOSIS — R05 Cough: Secondary | ICD-10-CM

## 2017-04-05 DIAGNOSIS — J432 Centrilobular emphysema: Secondary | ICD-10-CM

## 2017-04-05 MED ORDER — IPRATROPIUM-ALBUTEROL 0.5-2.5 (3) MG/3ML IN SOLN: 3.0000 mL | Freq: Four times a day (QID) | RESPIRATORY_TRACT | 1 refills | Status: DC | PRN

## 2017-04-05 NOTE — Progress Notes (Signed)
Name: Danielle Taylor   MRN: [id number]    DOB: [birth year]   Date:04/05/2017       Progress Note  Subjective  Chief Complaint  Chief Complaint  Patient presents with  . Follow-up    still has cough but pulse ox is improved    Cough  This is a chronic problem. The current episode started more than 1 year ago. The problem has been waxing and waning. The cough is productive of blood-tinged sputum. Associated symptoms include hemoptysis, shortness of breath and sweats. Pertinent negatives include no chest pain, chills, ear congestion, ear pain, fever, headaches, heartburn, myalgias, nasal congestion, postnasal drip, rash, rhinorrhea, sore throat, weight loss or wheezing. Nothing aggravates the symptoms. She has tried a beta-agonist inhaler, ipratropium inhaler, steroid inhaler, prescription cough suppressant, leukotriene antagonists and oral steroids for the symptoms. Her past medical history is significant for COPD. There is no history of environmental allergies.  Shortness of Breath  This is a recurrent problem. The current episode started 1 to 4 weeks ago. The problem occurs every few minutes. The problem has been gradually improving. Associated symptoms include hemoptysis. Pertinent negatives include no abdominal pain, chest pain, coryza, ear pain, fever, headaches, leg swelling, neck pain, orthopnea, rash, rhinorrhea, sore throat, sputum production or wheezing. The symptoms are aggravated by pollens. She has tried ipratropium inhalers, leukotriene antagonists, oral steroids, steroid inhalers and beta agonist inhalers for the symptoms. The treatment provided mild relief. Her past medical history is significant for CAD, chronic lung disease and COPD.    No problem-specific Assessment & Plan notes found for this encounter.   Past Medical History:  Diagnosis Date  . Allergy   . COPD (chronic obstructive pulmonary disease) ([place])   . Depression   . Dysrhythmia    Complete Heart Block  .  Gastritis, chronic   . GERD (gastroesophageal reflux disease)   . Graves disease   . History of hiatal hernia   . Hyperlipidemia   . Hypertension   . Hypothyroidism   . Presence of permanent cardiac pacemaker    2012    Past Surgical History:  Procedure Laterality Date  . APPENDECTOMY    . COLONOSCOPY  2000  . COLONOSCOPY WITH PROPOFOL N/A 04/26/2015   Procedure: COLONOSCOPY WITH PROPOFOL;  Surgeon: Hulen Luster, MD;  Location: [facility] ENDOSCOPY;  Service: Gastroenterology;  Laterality: N/A;  . fibroid tumors     fingers and wrist  . FOOT SURGERY    . TONSILLECTOMY    . VAGINAL HYSTERECTOMY      No family history on file.  Social History   Social History  . Marital status: Married    Spouse name: N/A  . Number of children: N/A  . Years of education: N/A   Occupational History  . Not on file.   Social History Main Topics  . Smoking status: Former Research scientist (life sciences)  . Smokeless tobacco: Never Used  . Alcohol use 0.0 oz/week  . Drug use: No  . Sexual activity: Yes   Other Topics Concern  . Not on file   Social History Narrative  . No narrative on file    Allergies  Allergen Reactions  . Penicillins Other (See Comments)    Has patient had a PCN reaction causing immediate rash, facial/tongue/throat swelling, SOB or lightheadedness with hypotension: No Has patient had a PCN reaction causing severe rash involving mucus membranes or skin necrosis: No Has patient had a PCN reaction that required hospitalization No Has patient had a PCN  reaction occurring within the last 10 years: No If all of the above answers are "NO", then may proceed with Cephalosporin use.     Outpatient Medications Prior to Visit  Medication Sig Dispense Refill  . albuterol (PROVENTIL HFA;VENTOLIN HFA) 108 (90 Base) MCG/ACT inhaler Inhale 1-2 puffs into the lungs every 6 (six) hours as needed for wheezing or shortness of breath. 1 Inhaler 11  . albuterol (PROVENTIL) (2.5 MG/3ML) 0.083% nebulizer solution  Take 3 mLs (2.5 mg total) by nebulization every 6 (six) hours as needed for wheezing or shortness of breath. 75 mL 12  . atorvastatin (LIPITOR) 20 MG tablet TAKE 1 TABLET BY MOUTH DAILY. 30 tablet 6  . azithromycin (ZITHROMAX) 250 MG tablet 2 today then 1 a ady for 4 days 6 tablet 0  . beclomethasone (QVAR) 80 MCG/ACT inhaler Inhale 1 puff into the lungs 2 (two) times daily. 1 Inhaler 11  . citalopram (CELEXA) 40 MG tablet TAKE 1 TABLET (40 MG TOTAL) BY MOUTH DAILY. 30 tablet 6  . gemfibrozil (LOPID) 600 MG tablet Take 1 tablet (600 mg total) by mouth daily. 30 tablet 6  . levothyroxine (SYNTHROID, LEVOTHROID) 112 MCG tablet *CALL & SCHEDULE APPT FOR MEDS** TAKE 1 TABLET EVERY DAY 30 tablet 6  . loratadine (CLARITIN) 10 MG tablet Take 1 tablet (10 mg total) by mouth daily. 30 tablet 11  . losartan (COZAAR) 50 MG tablet Take 1 tablet (50 mg total) by mouth daily. 90 tablet 1  . metoprolol succinate (TOPROL-XL) 50 MG 24 hr tablet TAKE 1 TABLET (50 MG TOTAL) BY MOUTH DAILY. *NEED TO SCHEDULE APPT FOR MEDS AND LABS** 30 tablet 6  . montelukast (SINGULAIR) 10 MG tablet Take 1 tablet (10 mg total) by mouth at bedtime. 30 tablet 3  . Multiple Vitamins-Minerals (CENTRUM SILVER PO) Take 1 tablet by mouth daily.    . Omega-3 Fatty Acids (FISH OIL) 1000 MG CAPS Take 1 capsule (1,000 mg total) by mouth daily. 100 capsule 3  . predniSONE (DELTASONE) 10 MG tablet Take 10 mg by mouth daily.  1  . rivaroxaban (XARELTO) 20 MG TABS tablet Take 1 tablet by mouth daily. Dr Allena Earing at [facility]    . simvastatin (ZOCOR) 20 MG tablet TAKE 1 TABLET BY MOUTH EVERY DAY 30 tablet 4  . traMADol (ULTRAM) 50 MG tablet TAKE ONE TABLET BY MOUTH EVERY 6 HOURS AS NEEDED FOR PAIN FOR UP TO 5 DAYS     Facility-Administered Medications Prior to Visit  Medication Dose Route Frequency Provider Last Rate Last Dose  . albuterol (PROVENTIL) (2.5 MG/3ML) 0.083% nebulizer solution 2.5 mg  2.5 mg Nebulization Once Emilianna Barlowe C, MD      .  ipratropium-albuterol (DUONEB) 0.5-2.5 (3) MG/3ML nebulizer solution 3 mL  3 mL Nebulization Q6H Ronnald Ramp, Adalynne Steffensmeier C, MD        Review of Systems  Constitutional: Negative for chills, fever, malaise/fatigue and weight loss.  HENT: Negative for ear discharge, ear pain, postnasal drip, rhinorrhea and sore throat.   Eyes: Negative for blurred vision.  Respiratory: Positive for cough, hemoptysis and shortness of breath. Negative for sputum production and wheezing.   Cardiovascular: Negative for chest pain, palpitations, orthopnea and leg swelling.  Gastrointestinal: Negative for abdominal pain, blood in stool, constipation, diarrhea, heartburn, melena and nausea.  Genitourinary: Negative for dysuria, frequency, hematuria and urgency.  Musculoskeletal: Negative for back pain, joint pain, myalgias and neck pain.  Skin: Negative for rash.  Neurological: Negative for dizziness, tingling, sensory change, focal  weakness and headaches.  Endo/Heme/Allergies: Negative for environmental allergies and polydipsia. Does not bruise/bleed easily.  Psychiatric/Behavioral: Negative for depression and suicidal ideas. The patient is not nervous/anxious and does not have insomnia.      Objective  Vitals:   04/05/17 1036  BP: 120/60  Pulse: 95  SpO2: 95%  Weight: 207 lb (93.9 kg)  Height: 5\' 4"  (1.626 m)    Physical Exam  Constitutional: She is well-developed, well-nourished, and in no distress. No distress.  HENT:  Head: Normocephalic and atraumatic.  Right Ear: External ear normal.  Left Ear: External ear normal.  Nose: Nose normal.  Mouth/Throat: Oropharynx is clear and moist.  Eyes: Pupils are equal, round, and reactive to light. Conjunctivae and EOM are normal. Right eye exhibits no discharge. Left eye exhibits no discharge.  Neck: Normal range of motion. Neck supple. No JVD present. No thyromegaly present.  Cardiovascular: Normal rate, regular rhythm, normal heart sounds and intact distal pulses.   Exam reveals no gallop and no friction rub.   No murmur heard. Pulmonary/Chest: Effort normal and breath sounds normal. No respiratory distress. She has no wheezes. She has no rales.  Abdominal: Soft. Bowel sounds are normal. She exhibits no mass. There is no tenderness. There is no guarding.  Musculoskeletal: Normal range of motion. She exhibits no edema.  Lymphadenopathy:    She has no cervical adenopathy.  Neurological: She is alert. She has normal reflexes.  Skin: Skin is warm and dry. No rash noted. She is not diaphoretic.  Psychiatric: Mood and affect normal.      Assessment & Plan  Problem List Items Addressed This Visit      Respiratory   Acute bronchitis   Centrilobular emphysema ([place]) - Primary   Relevant Medications   ipratropium-albuterol (DUONEB) 0.5-2.5 (3) MG/3ML SOLN     Musculoskeletal and Integument   Lumbar disc disease    Other Visit Diagnoses    Screening-pulmonary TB       Relevant Orders   PPD (Completed)   Chronic cough       Relevant Orders   PPD (Completed)      Meds ordered this encounter  Medications  . ipratropium-albuterol (DUONEB) 0.5-2.5 (3) MG/3ML SOLN    Sig: Take 3 mLs by nebulization every 6 (six) hours as needed.    Dispense:  360 mL    Refill:  1      Dr. [facility]  04/05/17

## 2017-04-07 LAB — TB SKIN TEST
Induration: 0 mm
TB Skin Test: NEGATIVE

## 2017-04-09 DIAGNOSIS — R0609 Other forms of dyspnea: Secondary | ICD-10-CM | POA: Diagnosis not present

## 2017-04-09 DIAGNOSIS — R042 Hemoptysis: Secondary | ICD-10-CM | POA: Diagnosis not present

## 2017-04-09 DIAGNOSIS — R05 Cough: Secondary | ICD-10-CM | POA: Diagnosis not present

## 2017-04-19 ENCOUNTER — Other Ambulatory Visit: Payer: Self-pay

## 2017-04-19 DIAGNOSIS — J432 Centrilobular emphysema: Secondary | ICD-10-CM

## 2017-04-19 MED ORDER — IPRATROPIUM-ALBUTEROL 0.5-2.5 (3) MG/3ML IN SOLN: 3.0000 mL | Freq: Four times a day (QID) | RESPIRATORY_TRACT | 2 refills | Status: DC | PRN

## 2017-04-20 ENCOUNTER — Telehealth: Payer: Self-pay

## 2017-04-20 NOTE — Telephone Encounter (Signed)
PA for Duoneb initiated with [email or web address] Key # [id number] Rx# [id number] Sent to [facility] Awaiting approval

## 2017-04-26 ENCOUNTER — Other Ambulatory Visit: Payer: Self-pay | Admitting: Family Medicine

## 2017-04-27 ENCOUNTER — Other Ambulatory Visit: Payer: Self-pay

## 2017-04-27 DIAGNOSIS — H2513 Age-related nuclear cataract, bilateral: Secondary | ICD-10-CM | POA: Diagnosis not present

## 2017-04-29 NOTE — Telephone Encounter (Signed)
PA for Ipratropium-Albuterol 0.5-2.5 (3)MG/3ML IN SOLN was denied through covermymeds. Please notify patient

## 2017-04-29 NOTE — Telephone Encounter (Signed)
I have since been in touch with Dr Gust Brooms office on this matter. I contacted them Tuesday morning. They have taken this over- thank you for your help last week

## 2017-04-30 DIAGNOSIS — M9904 Segmental and somatic dysfunction of sacral region: Secondary | ICD-10-CM | POA: Diagnosis not present

## 2017-04-30 DIAGNOSIS — M9903 Segmental and somatic dysfunction of lumbar region: Secondary | ICD-10-CM | POA: Diagnosis not present

## 2017-04-30 DIAGNOSIS — M9902 Segmental and somatic dysfunction of thoracic region: Secondary | ICD-10-CM | POA: Diagnosis not present

## 2017-04-30 DIAGNOSIS — M546 Pain in thoracic spine: Secondary | ICD-10-CM | POA: Diagnosis not present

## 2017-04-30 NOTE — Telephone Encounter (Signed)
Your welcome.

## 2017-05-04 DIAGNOSIS — M9904 Segmental and somatic dysfunction of sacral region: Secondary | ICD-10-CM | POA: Diagnosis not present

## 2017-05-04 DIAGNOSIS — M9902 Segmental and somatic dysfunction of thoracic region: Secondary | ICD-10-CM | POA: Diagnosis not present

## 2017-05-04 DIAGNOSIS — M546 Pain in thoracic spine: Secondary | ICD-10-CM | POA: Diagnosis not present

## 2017-05-04 DIAGNOSIS — M9903 Segmental and somatic dysfunction of lumbar region: Secondary | ICD-10-CM | POA: Diagnosis not present

## 2017-05-28 DIAGNOSIS — M9903 Segmental and somatic dysfunction of lumbar region: Secondary | ICD-10-CM | POA: Diagnosis not present

## 2017-05-28 DIAGNOSIS — M9904 Segmental and somatic dysfunction of sacral region: Secondary | ICD-10-CM | POA: Diagnosis not present

## 2017-05-28 DIAGNOSIS — M9902 Segmental and somatic dysfunction of thoracic region: Secondary | ICD-10-CM | POA: Diagnosis not present

## 2017-05-28 DIAGNOSIS — M545 Low back pain: Secondary | ICD-10-CM | POA: Diagnosis not present

## 2017-06-03 DIAGNOSIS — M9903 Segmental and somatic dysfunction of lumbar region: Secondary | ICD-10-CM | POA: Diagnosis not present

## 2017-06-03 DIAGNOSIS — M9904 Segmental and somatic dysfunction of sacral region: Secondary | ICD-10-CM | POA: Diagnosis not present

## 2017-06-03 DIAGNOSIS — M9902 Segmental and somatic dysfunction of thoracic region: Secondary | ICD-10-CM | POA: Diagnosis not present

## 2017-06-03 DIAGNOSIS — R05 Cough: Secondary | ICD-10-CM | POA: Diagnosis not present

## 2017-06-03 DIAGNOSIS — M5441 Lumbago with sciatica, right side: Secondary | ICD-10-CM | POA: Diagnosis not present

## 2017-06-07 DIAGNOSIS — M9902 Segmental and somatic dysfunction of thoracic region: Secondary | ICD-10-CM | POA: Diagnosis not present

## 2017-06-07 DIAGNOSIS — M9903 Segmental and somatic dysfunction of lumbar region: Secondary | ICD-10-CM | POA: Diagnosis not present

## 2017-06-07 DIAGNOSIS — M9904 Segmental and somatic dysfunction of sacral region: Secondary | ICD-10-CM | POA: Diagnosis not present

## 2017-06-07 DIAGNOSIS — M5441 Lumbago with sciatica, right side: Secondary | ICD-10-CM | POA: Diagnosis not present

## 2017-06-11 DIAGNOSIS — Z23 Encounter for immunization: Secondary | ICD-10-CM | POA: Diagnosis not present

## 2017-06-15 DIAGNOSIS — J310 Chronic rhinitis: Secondary | ICD-10-CM | POA: Diagnosis not present

## 2017-06-15 DIAGNOSIS — R053 Chronic cough: Secondary | ICD-10-CM | POA: Insufficient documentation

## 2017-06-15 DIAGNOSIS — K219 Gastro-esophageal reflux disease without esophagitis: Secondary | ICD-10-CM | POA: Diagnosis not present

## 2017-06-15 DIAGNOSIS — R0602 Shortness of breath: Secondary | ICD-10-CM | POA: Diagnosis not present

## 2017-06-15 DIAGNOSIS — R05 Cough: Secondary | ICD-10-CM | POA: Insufficient documentation

## 2017-06-15 DIAGNOSIS — J432 Centrilobular emphysema: Secondary | ICD-10-CM | POA: Diagnosis not present

## 2017-06-15 DIAGNOSIS — J849 Interstitial pulmonary disease, unspecified: Secondary | ICD-10-CM | POA: Diagnosis not present

## 2017-06-15 DIAGNOSIS — Z87891 Personal history of nicotine dependence: Secondary | ICD-10-CM | POA: Diagnosis not present

## 2017-06-15 DIAGNOSIS — J42 Unspecified chronic bronchitis: Secondary | ICD-10-CM | POA: Diagnosis not present

## 2017-06-21 ENCOUNTER — Other Ambulatory Visit: Payer: Self-pay

## 2017-06-21 DIAGNOSIS — M9904 Segmental and somatic dysfunction of sacral region: Secondary | ICD-10-CM | POA: Diagnosis not present

## 2017-06-21 DIAGNOSIS — M9903 Segmental and somatic dysfunction of lumbar region: Secondary | ICD-10-CM | POA: Diagnosis not present

## 2017-06-21 DIAGNOSIS — M9902 Segmental and somatic dysfunction of thoracic region: Secondary | ICD-10-CM | POA: Diagnosis not present

## 2017-06-21 DIAGNOSIS — M5441 Lumbago with sciatica, right side: Secondary | ICD-10-CM | POA: Diagnosis not present

## 2017-07-08 DIAGNOSIS — Z45018 Encounter for adjustment and management of other part of cardiac pacemaker: Secondary | ICD-10-CM | POA: Diagnosis not present

## 2017-07-08 DIAGNOSIS — Z950 Presence of cardiac pacemaker: Secondary | ICD-10-CM | POA: Diagnosis not present

## 2017-07-08 DIAGNOSIS — I442 Atrioventricular block, complete: Secondary | ICD-10-CM | POA: Diagnosis not present

## 2017-08-04 DIAGNOSIS — R05 Cough: Secondary | ICD-10-CM | POA: Diagnosis not present

## 2017-08-04 DIAGNOSIS — J42 Unspecified chronic bronchitis: Secondary | ICD-10-CM | POA: Diagnosis not present

## 2017-08-04 DIAGNOSIS — J849 Interstitial pulmonary disease, unspecified: Secondary | ICD-10-CM | POA: Diagnosis not present

## 2017-08-10 ENCOUNTER — Ambulatory Visit (INDEPENDENT_AMBULATORY_CARE_PROVIDER_SITE_OTHER): Payer: Medicare Other | Admitting: Family Medicine

## 2017-08-10 ENCOUNTER — Ambulatory Visit: Payer: Self-pay | Admitting: Family Medicine

## 2017-08-10 ENCOUNTER — Encounter: Payer: Self-pay | Admitting: Family Medicine

## 2017-08-10 VITALS — BP 128/82 | HR 84 | Ht 64.0 in | Wt 202.0 lb

## 2017-08-10 DIAGNOSIS — E6609 Other obesity due to excess calories: Secondary | ICD-10-CM | POA: Insufficient documentation

## 2017-08-10 DIAGNOSIS — E782 Mixed hyperlipidemia: Secondary | ICD-10-CM | POA: Diagnosis not present

## 2017-08-10 DIAGNOSIS — J4520 Mild intermittent asthma, uncomplicated: Secondary | ICD-10-CM | POA: Insufficient documentation

## 2017-08-10 DIAGNOSIS — J301 Allergic rhinitis due to pollen: Secondary | ICD-10-CM | POA: Insufficient documentation

## 2017-08-10 DIAGNOSIS — Z6834 Body mass index (BMI) 34.0-34.9, adult: Secondary | ICD-10-CM | POA: Diagnosis not present

## 2017-08-10 DIAGNOSIS — E66811 Other obesity due to excess calories: Secondary | ICD-10-CM

## 2017-08-10 DIAGNOSIS — I2510 Atherosclerotic heart disease of native coronary artery without angina pectoris: Secondary | ICD-10-CM | POA: Diagnosis not present

## 2017-08-10 DIAGNOSIS — E039 Hypothyroidism, unspecified: Secondary | ICD-10-CM | POA: Diagnosis not present

## 2017-08-10 DIAGNOSIS — I10 Essential (primary) hypertension: Secondary | ICD-10-CM

## 2017-08-10 DIAGNOSIS — N3090 Cystitis, unspecified without hematuria: Secondary | ICD-10-CM | POA: Diagnosis not present

## 2017-08-10 DIAGNOSIS — E7849 Other hyperlipidemia: Secondary | ICD-10-CM | POA: Diagnosis not present

## 2017-08-10 DIAGNOSIS — F329 Major depressive disorder, single episode, unspecified: Secondary | ICD-10-CM | POA: Insufficient documentation

## 2017-08-10 DIAGNOSIS — F32A Depression, unspecified: Secondary | ICD-10-CM

## 2017-08-10 LAB — POCT URINALYSIS DIPSTICK
Bilirubin, UA: NEGATIVE
GLUCOSE UA: NEGATIVE
KETONES UA: NEGATIVE
NITRITE UA: NEGATIVE
Urobilinogen, UA: 0.2 E.U./dL
pH, UA: 6 (ref 5.0–8.0)

## 2017-08-10 MED ORDER — LEVOTHYROXINE SODIUM 112 MCG PO TABS: ORAL_TABLET | ORAL | 6 refills | Status: DC

## 2017-08-10 MED ORDER — MONTELUKAST SODIUM 10 MG PO TABS: 10.0000 mg | ORAL_TABLET | Freq: Every day | ORAL | 6 refills | Status: DC

## 2017-08-10 MED ORDER — METOPROLOL SUCCINATE ER 50 MG PO TB24: ORAL_TABLET | ORAL | 6 refills | Status: DC

## 2017-08-10 MED ORDER — GEMFIBROZIL 600 MG PO TABS: 600.0000 mg | ORAL_TABLET | Freq: Every day | ORAL | 6 refills | Status: DC

## 2017-08-10 MED ORDER — LOSARTAN POTASSIUM 50 MG PO TABS: 50.0000 mg | ORAL_TABLET | Freq: Every day | ORAL | 1 refills | Status: DC

## 2017-08-10 MED ORDER — CITALOPRAM HYDROBROMIDE 40 MG PO TABS: 40.0000 mg | ORAL_TABLET | Freq: Every day | ORAL | 6 refills | Status: DC

## 2017-08-10 MED ORDER — CIPROFLOXACIN HCL 250 MG PO TABS: 250.0000 mg | ORAL_TABLET | Freq: Two times a day (BID) | ORAL | 0 refills | Status: DC

## 2017-08-10 MED ORDER — SIMVASTATIN 20 MG PO TABS: 20.0000 mg | ORAL_TABLET | Freq: Every day | ORAL | 6 refills | Status: DC

## 2017-08-10 NOTE — Patient Instructions (Signed)

## 2017-08-10 NOTE — Progress Notes (Signed)
Name: Danielle Taylor   MRN: [id number]    DOB: 10/06/1945   Date:08/10/2017       Progress Note  Subjective  Chief Complaint  Chief Complaint  Patient presents with  . Depression  . Hypothyroidism  . Hyperlipidemia  . Hypertension  . Allergic Rhinitis   . Urinary Tract Infection    Depression         The patient presents with no depression.  This is a chronic problem.  The current episode started more than 1 year ago.   The problem occurs intermittently.  The problem has been gradually improving since onset.  Associated symptoms include no decreased concentration, no fatigue, no helplessness, no hopelessness, does not have insomnia, not irritable, no restlessness, no decreased interest, no appetite change, no body aches, no myalgias, no headaches, no indigestion, not sad and no suicidal ideas.     The symptoms are aggravated by nothing.  Past treatments include SSRIs - Selective serotonin reuptake inhibitors.  Compliance with treatment is good.  Previous treatment provided moderate relief.  Past medical history includes thyroid problem.     Pertinent negatives include no hypothyroidism, no physical disability, no anxiety and no depression.  (intermitant prednisone) Hyperlipidemia  This is a chronic problem. The current episode started more than 1 year ago. The problem is controlled. Recent lipid tests were reviewed and are normal. Exacerbating diseases include obesity. She has no history of chronic renal disease, diabetes, hypothyroidism, liver disease or nephrotic syndrome. Pertinent negatives include no chest pain, focal sensory loss, focal weakness, leg pain, myalgias or shortness of breath. There are no compliance problems.  Risk factors for coronary artery disease include dyslipidemia, hypertension and obesity.  Hypertension  This is a chronic problem. The current episode started more than 1 year ago. The problem is unchanged. The problem is controlled. Pertinent negatives include no  anxiety, blurred vision, chest pain, headaches, malaise/fatigue, neck pain, orthopnea, palpitations, peripheral edema, PND, shortness of breath or sweats. Agents associated with hypertension include thyroid hormones and steroids. There are no known risk factors for coronary artery disease. Past treatments include angiotensin blockers and beta blockers. There are no compliance problems.  There is no history of angina, kidney disease, CAD/MI, CVA, heart failure, left ventricular hypertrophy, PVD or retinopathy. Identifiable causes of hypertension include a thyroid problem. There is no history of chronic renal disease, a hypertension causing med or renovascular disease.  Urinary Tract Infection   This is a new problem. The current episode started in the past 7 days. The problem occurs every urination. The problem has been gradually worsening. The quality of the pain is described as burning. The pain is at a severity of 3/10. The pain is moderate. There has been no fever. Associated symptoms include frequency and urgency. Pertinent negatives include no chills, discharge, flank pain, hematuria, hesitancy, nausea, sweats or vomiting. She has tried nothing for the symptoms. intermitant prednisone  Thyroid Problem  Presents for follow-up visit. Patient reports no anxiety, cold intolerance, constipation, depressed mood, diaphoresis, diarrhea, dry skin, fatigue, hair loss, heat intolerance, hoarse voice, leg swelling, nail problem, palpitations, tremors, visual change, weight gain or weight loss. The symptoms have been stable. Her past medical history is significant for hyperlipidemia. There is no history of diabetes or heart failure. (Intermitant prednisone)    No problem-specific Assessment & Plan notes found for this encounter.   Past Medical History:  Diagnosis Date  . Allergy   . COPD (chronic obstructive pulmonary disease) ([place])   .  Depression   . Dysrhythmia    Complete Heart Block  . Gastritis,  chronic   . GERD (gastroesophageal reflux disease)   . Graves disease   . History of hiatal hernia   . Hyperlipidemia   . Hypertension   . Hypothyroidism   . Presence of permanent cardiac pacemaker    2012    Past Surgical History:  Procedure Laterality Date  . APPENDECTOMY    . COLONOSCOPY  2000  . COLONOSCOPY WITH PROPOFOL N/A 04/26/2015   Procedure: COLONOSCOPY WITH PROPOFOL;  Surgeon: Hulen Luster, MD;  Location[facility] ENDOSCOPY;  Service: Gastroenterology;  Laterality: N/A;  . fibroid tumors     fingers and wrist  . FOOT SURGERY    . TONSILLECTOMY    . VAGINAL HYSTERECTOMY      History reviewed. No pertinent family history.  Social History   Socioeconomic History  . Marital status: Married    Spouse name: Not on file  . Number of children: Not on file  . Years of education: Not on file  . Highest education level: Not on file  Social Needs  . Financial resource strain: Not on file  . Food insecurity - worry: Not on file  . Food insecurity - inability: Not on file  . Transportation needs - medical: Not on file  . Transportation needs - non-medical: Not on file  Occupational History  . Not on file  Tobacco Use  . Smoking status: Former Research scientist (life sciences)  . Smokeless tobacco: Never Used  Substance and Sexual Activity  . Alcohol use: Yes    Alcohol/week: 0.0 oz  . Drug use: No  . Sexual activity: Yes  Other Topics Concern  . Not on file  Social History Narrative  . Not on file    Allergies  Allergen Reactions  . Penicillins Other (See Comments)    Has patient had a PCN reaction causing immediate rash, facial/tongue/throat swelling, SOB or lightheadedness with hypotension: No Has patient had a PCN reaction causing severe rash involving mucus membranes or skin necrosis: No Has patient had a PCN reaction that required hospitalization No Has patient had a PCN reaction occurring within the last 10 years: No If all of the above answers are "NO", then may proceed with  Cephalosporin use.     Outpatient Medications Prior to Visit  Medication Sig Dispense Refill  . albuterol (PROVENTIL HFA;VENTOLIN HFA) 108 (90 Base) MCG/ACT inhaler Inhale 1-2 puffs into the lungs every 6 (six) hours as needed for wheezing or shortness of breath. 1 Inhaler 11  . albuterol (PROVENTIL) (2.5 MG/3ML) 0.083% nebulizer solution Take 3 mLs (2.5 mg total) by nebulization every 6 (six) hours as needed for wheezing or shortness of breath. 75 mL 12  . ipratropium-albuterol (DUONEB) 0.5-2.5 (3) MG/3ML SOLN Take 3 mLs by nebulization every 6 (six) hours as needed. 360 mL 2  . loratadine (CLARITIN) 10 MG tablet Take 1 tablet (10 mg total) by mouth daily. 30 tablet 11  . Multiple Vitamins-Minerals (CENTRUM SILVER PO) Take 1 tablet by mouth daily.    . rivaroxaban (XARELTO) 20 MG TABS tablet Take 1 tablet by mouth daily. Dr Allena Earing at [facility]    . traMADol (ULTRAM) 50 MG tablet TAKE ONE TABLET BY MOUTH EVERY 6 HOURS AS NEEDED FOR PAIN FOR UP TO 5 DAYS    . atorvastatin (LIPITOR) 20 MG tablet TAKE 1 TABLET BY MOUTH DAILY. 30 tablet 6  . beclomethasone (QVAR) 80 MCG/ACT inhaler Inhale 1 puff into the lungs 2 (  two) times daily. 1 Inhaler 11  . citalopram (CELEXA) 40 MG tablet TAKE 1 TABLET BY MOUTH DAILY 30 tablet 2  . gemfibrozil (LOPID) 600 MG tablet Take 1 tablet (600 mg total) by mouth daily. 30 tablet 6  . levothyroxine (SYNTHROID, LEVOTHROID) 112 MCG tablet *CALL & SCHEDULE APPT FOR MEDS** TAKE 1 TABLET EVERY DAY 30 tablet 6  . losartan (COZAAR) 50 MG tablet Take 1 tablet (50 mg total) by mouth daily. 90 tablet 1  . metoprolol succinate (TOPROL-XL) 50 MG 24 hr tablet TAKE 1 TABLET (50 MG TOTAL) BY MOUTH DAILY. *NEED TO SCHEDULE APPT FOR MEDS AND LABS** 30 tablet 6  . montelukast (SINGULAIR) 10 MG tablet Take 1 tablet (10 mg total) by mouth at bedtime. 30 tablet 3  . Omega-3 Fatty Acids (FISH OIL) 1000 MG CAPS Take 1 capsule (1,000 mg total) by mouth daily. 100 capsule 3  . simvastatin (ZOCOR)  20 MG tablet TAKE 1 TABLET BY MOUTH EVERY DAY 30 tablet 4  . azithromycin (ZITHROMAX) 250 MG tablet 2 today then 1 a ady for 4 days 6 tablet 0  . predniSONE (DELTASONE) 10 MG tablet Take 10 mg by mouth daily.  1   Facility-Administered Medications Prior to Visit  Medication Dose Route Frequency Provider Last Rate Last Dose  . albuterol (PROVENTIL) (2.5 MG/3ML) 0.083% nebulizer solution 2.5 mg  2.5 mg Nebulization Once Sallyanne Birkhead C, MD      . ipratropium-albuterol (DUONEB) 0.5-2.5 (3) MG/3ML nebulizer solution 3 mL  3 mL Nebulization Q6H Jamaree Hosier C, MD        Review of Systems  Constitutional: Negative for appetite change, chills, diaphoresis, fatigue, fever, malaise/fatigue, weight gain and weight loss.  HENT: Negative for ear discharge, ear pain, hoarse voice and sore throat.   Eyes: Negative for blurred vision.  Respiratory: Negative for cough, sputum production, shortness of breath and wheezing.   Cardiovascular: Negative for chest pain, palpitations, orthopnea, leg swelling and PND.  Gastrointestinal: Negative for abdominal pain, blood in stool, constipation, diarrhea, heartburn, melena, nausea and vomiting.  Genitourinary: Positive for frequency and urgency. Negative for dysuria, flank pain, hematuria and hesitancy.  Musculoskeletal: Negative for back pain, joint pain, myalgias and neck pain.  Skin: Negative for rash.  Neurological: Negative for dizziness, tingling, tremors, sensory change, focal weakness and headaches.  Endo/Heme/Allergies: Negative for environmental allergies, cold intolerance, heat intolerance and polydipsia. Does not bruise/bleed easily.  Psychiatric/Behavioral: Positive for depression. Negative for decreased concentration and suicidal ideas. The patient is not nervous/anxious and does not have insomnia.      Objective  Vitals:   08/10/17 1401  BP: 128/82  Pulse: 84  Weight: 202 lb (91.6 kg)  Height: 5\' 4"  (1.626 m)    Physical Exam   Constitutional: She is well-developed, well-nourished, and in no distress. She is not irritable. No distress.  HENT:  Head: Normocephalic and atraumatic.  Right Ear: External ear normal.  Left Ear: External ear normal.  Nose: Nose normal.  Mouth/Throat: Oropharynx is clear and moist.  Eyes: Conjunctivae and EOM are normal. Pupils are equal, round, and reactive to light. Right eye exhibits no discharge. Left eye exhibits no discharge.  Neck: Normal range of motion. Neck supple. No JVD present. No thyromegaly present.  Cardiovascular: Normal rate, regular rhythm, normal heart sounds and intact distal pulses. Exam reveals no gallop and no friction rub.  No murmur heard. Pulmonary/Chest: Effort normal and breath sounds normal. She has no wheezes. She has no rales.  Abdominal:  Soft. Bowel sounds are normal. She exhibits no mass. There is no tenderness. There is no guarding.  Musculoskeletal: Normal range of motion. She exhibits no edema.  Lymphadenopathy:    She has no cervical adenopathy.  Neurological: She is alert. She has normal reflexes.  Skin: Skin is warm and dry. She is not diaphoretic.  Psychiatric: Mood and affect normal.  Nursing note and vitals reviewed.     Assessment & Plan  Problem List Items Addressed This Visit      Cardiovascular and Mediastinum   Essential (primary) hypertension - Primary   Relevant Medications   losartan (COZAAR) 50 MG tablet   metoprolol succinate (TOPROL-XL) 50 MG 24 hr tablet   gemfibrozil (LOPID) 600 MG tablet   simvastatin (ZOCOR) 20 MG tablet     Respiratory   Reactive airway disease, mild intermittent, uncomplicated   Relevant Medications   montelukast (SINGULAIR) 10 MG tablet   Chronic seasonal allergic rhinitis due to pollen   Relevant Medications   montelukast (SINGULAIR) 10 MG tablet     Endocrine   Hypothyroid   Relevant Medications   metoprolol succinate (TOPROL-XL) 50 MG 24 hr tablet   levothyroxine (SYNTHROID,  LEVOTHROID) 112 MCG tablet     Other   Familial multiple lipoprotein-type hyperlipidemia   Relevant Medications   losartan (COZAAR) 50 MG tablet   metoprolol succinate (TOPROL-XL) 50 MG 24 hr tablet   gemfibrozil (LOPID) 600 MG tablet   simvastatin (ZOCOR) 20 MG tablet   Mixed hyperlipidemia   Relevant Medications   losartan (COZAAR) 50 MG tablet   metoprolol succinate (TOPROL-XL) 50 MG 24 hr tablet   gemfibrozil (LOPID) 600 MG tablet   simvastatin (ZOCOR) 20 MG tablet   Depression   Relevant Medications   citalopram (CELEXA) 40 MG tablet   Class 1 obesity due to excess calories without serious comorbidity with body mass index (BMI) of 34.0 to 34.9 in adult    Other Visit Diagnoses    Cystitis       Relevant Medications   ciprofloxacin (CIPRO) 250 MG tablet   Other Relevant Orders   POCT urinalysis dipstick (Completed)      Meds ordered this encounter  Medications  . montelukast (SINGULAIR) 10 MG tablet    Sig: Take 1 tablet (10 mg total) by mouth at bedtime.    Dispense:  30 tablet    Refill:  6  . losartan (COZAAR) 50 MG tablet    Sig: Take 1 tablet (50 mg total) by mouth daily.    Dispense:  90 tablet    Refill:  1  . metoprolol succinate (TOPROL-XL) 50 MG 24 hr tablet    Sig: TAKE 1 TABLET (50 MG TOTAL) BY MOUTH DAILY. *NEED TO SCHEDULE APPT FOR MEDS AND LABS**    Dispense:  30 tablet    Refill:  6  . gemfibrozil (LOPID) 600 MG tablet    Sig: Take 1 tablet (600 mg total) by mouth daily.    Dispense:  30 tablet    Refill:  6    Needs appt for meds  . levothyroxine (SYNTHROID, LEVOTHROID) 112 MCG tablet    Sig: *CALL & SCHEDULE APPT FOR MEDS** TAKE 1 TABLET EVERY DAY    Dispense:  30 tablet    Refill:  6  . simvastatin (ZOCOR) 20 MG tablet    Sig: Take 1 tablet (20 mg total) by mouth daily.    Dispense:  30 tablet    Refill:  6  . citalopram (  CELEXA) 40 MG tablet    Sig: Take 1 tablet (40 mg total) by mouth daily.    Dispense:  30 tablet    Refill:  6  .  ciprofloxacin (CIPRO) 250 MG tablet    Sig: Take 1 tablet (250 mg total) by mouth 2 (two) times daily.    Dispense:  6 tablet    Refill:  0      Dr. Otilio Miu [facility]  08/10/17

## 2017-08-12 ENCOUNTER — Telehealth: Payer: Self-pay

## 2017-08-12 ENCOUNTER — Other Ambulatory Visit: Payer: Self-pay

## 2017-08-12 DIAGNOSIS — N3090 Cystitis, unspecified without hematuria: Secondary | ICD-10-CM

## 2017-08-12 MED ORDER — SULFAMETHOXAZOLE-TRIMETHOPRIM 800-160 MG PO TABS
1.0000 | ORAL_TABLET | Freq: Two times a day (BID) | ORAL | 0 refills | Status: DC
Start: 2017-08-12 — End: 2017-11-15

## 2017-08-12 NOTE — Telephone Encounter (Signed)
Pt called saying not better- switched to sulfa 1 BID x 7 days sent to [facility] [place]

## 2017-09-02 DIAGNOSIS — M5442 Lumbago with sciatica, left side: Secondary | ICD-10-CM | POA: Diagnosis not present

## 2017-09-02 DIAGNOSIS — M9902 Segmental and somatic dysfunction of thoracic region: Secondary | ICD-10-CM | POA: Diagnosis not present

## 2017-09-02 DIAGNOSIS — M5441 Lumbago with sciatica, right side: Secondary | ICD-10-CM | POA: Diagnosis not present

## 2017-09-02 DIAGNOSIS — M9903 Segmental and somatic dysfunction of lumbar region: Secondary | ICD-10-CM | POA: Diagnosis not present

## 2017-09-02 DIAGNOSIS — M9904 Segmental and somatic dysfunction of sacral region: Secondary | ICD-10-CM | POA: Diagnosis not present

## 2017-09-23 DIAGNOSIS — M9903 Segmental and somatic dysfunction of lumbar region: Secondary | ICD-10-CM | POA: Diagnosis not present

## 2017-09-23 DIAGNOSIS — M9904 Segmental and somatic dysfunction of sacral region: Secondary | ICD-10-CM | POA: Diagnosis not present

## 2017-09-23 DIAGNOSIS — M9902 Segmental and somatic dysfunction of thoracic region: Secondary | ICD-10-CM | POA: Diagnosis not present

## 2017-10-14 DIAGNOSIS — I480 Paroxysmal atrial fibrillation: Secondary | ICD-10-CM | POA: Diagnosis not present

## 2017-10-14 DIAGNOSIS — Z79899 Other long term (current) drug therapy: Secondary | ICD-10-CM | POA: Diagnosis not present

## 2017-10-14 DIAGNOSIS — Z950 Presence of cardiac pacemaker: Secondary | ICD-10-CM | POA: Diagnosis not present

## 2017-10-14 DIAGNOSIS — I442 Atrioventricular block, complete: Secondary | ICD-10-CM | POA: Diagnosis not present

## 2017-10-14 DIAGNOSIS — Z7901 Long term (current) use of anticoagulants: Secondary | ICD-10-CM | POA: Diagnosis not present

## 2017-10-14 DIAGNOSIS — I498 Other specified cardiac arrhythmias: Secondary | ICD-10-CM | POA: Diagnosis not present

## 2017-10-14 DIAGNOSIS — Z45018 Encounter for adjustment and management of other part of cardiac pacemaker: Secondary | ICD-10-CM | POA: Diagnosis not present

## 2017-10-21 DIAGNOSIS — M5442 Lumbago with sciatica, left side: Secondary | ICD-10-CM | POA: Diagnosis not present

## 2017-10-21 DIAGNOSIS — M9903 Segmental and somatic dysfunction of lumbar region: Secondary | ICD-10-CM | POA: Diagnosis not present

## 2017-10-21 DIAGNOSIS — M9904 Segmental and somatic dysfunction of sacral region: Secondary | ICD-10-CM | POA: Diagnosis not present

## 2017-10-21 DIAGNOSIS — M9902 Segmental and somatic dysfunction of thoracic region: Secondary | ICD-10-CM | POA: Diagnosis not present

## 2017-10-29 ENCOUNTER — Ambulatory Visit (INDEPENDENT_AMBULATORY_CARE_PROVIDER_SITE_OTHER): Payer: Medicare Other | Admitting: Family Medicine

## 2017-10-29 ENCOUNTER — Encounter: Payer: Self-pay | Admitting: Family Medicine

## 2017-10-29 VITALS — BP 120/80 | HR 76 | Ht 64.0 in | Wt 205.0 lb

## 2017-10-29 DIAGNOSIS — J40 Bronchitis, not specified as acute or chronic: Secondary | ICD-10-CM

## 2017-10-29 DIAGNOSIS — E6609 Other obesity due to excess calories: Secondary | ICD-10-CM | POA: Diagnosis not present

## 2017-10-29 DIAGNOSIS — J019 Acute sinusitis, unspecified: Secondary | ICD-10-CM

## 2017-10-29 DIAGNOSIS — Z6835 Body mass index (BMI) 35.0-35.9, adult: Secondary | ICD-10-CM | POA: Diagnosis not present

## 2017-10-29 MED ORDER — LEVOFLOXACIN 500 MG PO TABS: 500.0000 mg | ORAL_TABLET | Freq: Every day | ORAL | 0 refills | Status: DC

## 2017-10-29 MED ORDER — GUAIFENESIN-CODEINE 100-10 MG/5ML PO SYRP: 5.0000 mL | ORAL_SOLUTION | Freq: Three times a day (TID) | ORAL | 0 refills | Status: DC | PRN

## 2017-10-29 NOTE — Progress Notes (Signed)
Name: Danielle Taylor   MRN: [id number]    DOB: [birth year]   Date:10/29/2017       Progress Note  Subjective  Chief Complaint  Chief Complaint  Patient presents with  . Sinusitis    cough and cong    Sinusitis  This is a recurrent problem. The current episode started yesterday. The problem has been gradually worsening since onset. There has been no fever. The pain is mild. Associated symptoms include congestion, coughing, a hoarse voice, sinus pressure, sneezing and a sore throat. Pertinent negatives include no chills, diaphoresis, ear pain, headaches, neck pain, shortness of breath or swollen glands. Past treatments include oral decongestants. The treatment provided mild relief.  Cough  This is a new problem. The current episode started yesterday. The problem has been gradually worsening. The cough is productive of purulent sputum. Associated symptoms include a sore throat. Pertinent negatives include no chest pain, chills, ear pain, fever, headaches, heartburn, myalgias, rash, shortness of breath, weight loss or wheezing. There is no history of environmental allergies.    No problem-specific Assessment & Plan notes found for this encounter.   Past Medical History:  Diagnosis Date  . Allergy   . COPD (chronic obstructive pulmonary disease) ([place])   . Depression   . Dysrhythmia    Complete Heart Block  . Gastritis, chronic   . GERD (gastroesophageal reflux disease)   . Graves disease   . History of hiatal hernia   . Hyperlipidemia   . Hypertension   . Hypothyroidism   . Presence of permanent cardiac pacemaker    2012    Past Surgical History:  Procedure Laterality Date  . APPENDECTOMY    . COLONOSCOPY  2000  . COLONOSCOPY WITH PROPOFOL N/A 04/26/2015   Procedure: COLONOSCOPY WITH PROPOFOL;  Surgeon: Hulen Luster, MD;  Location: [facility] ENDOSCOPY;  Service: Gastroenterology;  Laterality: N/A;  . fibroid tumors     fingers and wrist  . FOOT SURGERY    . TONSILLECTOMY    .  VAGINAL HYSTERECTOMY      No family history on file.  Social History   Socioeconomic History  . Marital status: Married    Spouse name: Not on file  . Number of children: Not on file  . Years of education: Not on file  . Highest education level: Not on file  Social Needs  . Financial resource strain: Not on file  . Food insecurity - worry: Not on file  . Food insecurity - inability: Not on file  . Transportation needs - medical: Not on file  . Transportation needs - non-medical: Not on file  Occupational History  . Not on file  Tobacco Use  . Smoking status: Former Research scientist (life sciences)  . Smokeless tobacco: Never Used  Substance and Sexual Activity  . Alcohol use: Yes    Alcohol/week: 0.0 oz  . Drug use: No  . Sexual activity: Yes  Other Topics Concern  . Not on file  Social History Narrative  . Not on file    Allergies  Allergen Reactions  . Penicillins Other (See Comments)    Has patient had a PCN reaction causing immediate rash, facial/tongue/throat swelling, SOB or lightheadedness with hypotension: No Has patient had a PCN reaction causing severe rash involving mucus membranes or skin necrosis: No Has patient had a PCN reaction that required hospitalization No Has patient had a PCN reaction occurring within the last 10 years: No If all of the above answers are "NO", then may proceed  with Cephalosporin use.     Outpatient Medications Prior to Visit  Medication Sig Dispense Refill  . albuterol (PROVENTIL HFA;VENTOLIN HFA) 108 (90 Base) MCG/ACT inhaler Inhale 1-2 puffs into the lungs every 6 (six) hours as needed for wheezing or shortness of breath. 1 Inhaler 11  . albuterol (PROVENTIL) (2.5 MG/3ML) 0.083% nebulizer solution Take 3 mLs (2.5 mg total) by nebulization every 6 (six) hours as needed for wheezing or shortness of breath. 75 mL 12  . citalopram (CELEXA) 40 MG tablet Take 1 tablet (40 mg total) by mouth daily. 30 tablet 6  . gemfibrozil (LOPID) 600 MG tablet Take 1  tablet (600 mg total) by mouth daily. 30 tablet 6  . ipratropium-albuterol (DUONEB) 0.5-2.5 (3) MG/3ML SOLN Take 3 mLs by nebulization every 6 (six) hours as needed. 360 mL 2  . levothyroxine (SYNTHROID, LEVOTHROID) 112 MCG tablet *CALL & SCHEDULE APPT FOR MEDS** TAKE 1 TABLET EVERY DAY 30 tablet 6  . loratadine (CLARITIN) 10 MG tablet Take 1 tablet (10 mg total) by mouth daily. 30 tablet 11  . losartan (COZAAR) 50 MG tablet Take 1 tablet (50 mg total) by mouth daily. 90 tablet 1  . metoprolol succinate (TOPROL-XL) 50 MG 24 hr tablet TAKE 1 TABLET (50 MG TOTAL) BY MOUTH DAILY. *NEED TO SCHEDULE APPT FOR MEDS AND LABS** 30 tablet 6  . montelukast (SINGULAIR) 10 MG tablet Take 1 tablet (10 mg total) by mouth at bedtime. 30 tablet 6  . Multiple Vitamins-Minerals (CENTRUM SILVER PO) Take 1 tablet by mouth daily.    . rivaroxaban (XARELTO) 20 MG TABS tablet Take 1 tablet by mouth daily. Dr Allena Earing at [facility]    . simvastatin (ZOCOR) 20 MG tablet Take 1 tablet (20 mg total) by mouth daily. 30 tablet 6  . sulfamethoxazole-trimethoprim (BACTRIM DS,SEPTRA DS) 800-160 MG tablet Take 1 tablet by mouth 2 (two) times daily. 14 tablet 0  . traMADol (ULTRAM) 50 MG tablet TAKE ONE TABLET BY MOUTH EVERY 6 HOURS AS NEEDED FOR PAIN FOR UP TO 5 DAYS     Facility-Administered Medications Prior to Visit  Medication Dose Route Frequency Provider Last Rate Last Dose  . albuterol (PROVENTIL) (2.5 MG/3ML) 0.083% nebulizer solution 2.5 mg  2.5 mg Nebulization Once Jones, Deanna C, MD      . ipratropium-albuterol (DUONEB) 0.5-2.5 (3) MG/3ML nebulizer solution 3 mL  3 mL Nebulization Q6H Jones, Deanna C, MD        Review of Systems  Constitutional: Negative for chills, diaphoresis, fever, malaise/fatigue and weight loss.  HENT: Positive for congestion, hoarse voice, sinus pressure, sneezing and sore throat. Negative for ear discharge and ear pain.   Eyes: Negative for blurred vision.  Respiratory: Positive for cough.  Negative for sputum production, shortness of breath and wheezing.   Cardiovascular: Negative for chest pain, palpitations and leg swelling.  Gastrointestinal: Negative for abdominal pain, blood in stool, constipation, diarrhea, heartburn, melena and nausea.  Genitourinary: Negative for dysuria, frequency, hematuria and urgency.  Musculoskeletal: Negative for back pain, joint pain, myalgias and neck pain.  Skin: Negative for rash.  Neurological: Negative for dizziness, tingling, sensory change, focal weakness and headaches.  Endo/Heme/Allergies: Negative for environmental allergies and polydipsia. Does not bruise/bleed easily.  Psychiatric/Behavioral: Negative for depression and suicidal ideas. The patient is not nervous/anxious and does not have insomnia.      Objective  Vitals:   10/29/17 1359  BP: 120/80  Pulse: 76  SpO2: 94%  Weight: 205 lb (93 kg)  Height:  5\' 4"  (1.626 m)    Physical Exam  Constitutional: She is well-developed, well-nourished, and in no distress. No distress.  HENT:  Head: Normocephalic and atraumatic.  Right Ear: External ear normal.  Left Ear: External ear normal.  Nose: Nose normal.  Mouth/Throat: Oropharynx is clear and moist.  Eyes: Conjunctivae and EOM are normal. Pupils are equal, round, and reactive to light. Right eye exhibits no discharge. Left eye exhibits no discharge.  Neck: Normal range of motion. Neck supple. No JVD present. No thyromegaly present.  Cardiovascular: Normal rate, regular rhythm, normal heart sounds and intact distal pulses. Exam reveals no gallop and no friction rub.  No murmur heard. Pulmonary/Chest: Effort normal and breath sounds normal. She has no wheezes. She has no rales.  Abdominal: Soft. Bowel sounds are normal. She exhibits no mass. There is no tenderness. There is no guarding.  Musculoskeletal: Normal range of motion. She exhibits no edema.  Lymphadenopathy:    She has no cervical adenopathy.  Neurological: She is  alert. She has normal reflexes.  Skin: Skin is warm and dry. She is not diaphoretic.  Psychiatric: Mood and affect normal.  Nursing note and vitals reviewed.     Assessment & Plan  Problem List Items Addressed This Visit    None    Visit Diagnoses    Acute sinusitis, recurrence not specified, unspecified location    -  Primary   Relevant Medications   levofloxacin (LEVAQUIN) 500 MG tablet   guaiFENesin-codeine (ROBITUSSIN AC) 100-10 MG/5ML syrup   Bronchitis       Relevant Medications   levofloxacin (LEVAQUIN) 500 MG tablet   guaiFENesin-codeine (ROBITUSSIN AC) 100-10 MG/5ML syrup   Class 2 obesity due to excess calories without serious comorbidity with body mass index (BMI) of 35.0 to 35.9 in adult       diet /program discussed      Meds ordered this encounter  Medications  . levofloxacin (LEVAQUIN) 500 MG tablet    Sig: Take 1 tablet (500 mg total) by mouth daily.    Dispense:  7 tablet    Refill:  0  . guaiFENesin-codeine (ROBITUSSIN AC) 100-10 MG/5ML syrup    Sig: Take 5 mLs by mouth 3 (three) times daily as needed for cough.    Dispense:  150 mL    Refill:  0  pt was given info on Dr Sammuel Hines diet class and told how to register.    Dr. Macon [facility]  10/29/17

## 2017-11-03 ENCOUNTER — Telehealth: Payer: Self-pay

## 2017-11-03 NOTE — Telephone Encounter (Signed)
Called pt to sched AWV w/ [facility]. LVM requesting returned call.  

## 2017-11-04 DIAGNOSIS — J449 Chronic obstructive pulmonary disease, unspecified: Secondary | ICD-10-CM | POA: Diagnosis not present

## 2017-11-04 DIAGNOSIS — R05 Cough: Secondary | ICD-10-CM | POA: Diagnosis not present

## 2017-11-04 DIAGNOSIS — J8489 Other specified interstitial pulmonary diseases: Secondary | ICD-10-CM | POA: Diagnosis not present

## 2017-11-04 DIAGNOSIS — R0602 Shortness of breath: Secondary | ICD-10-CM | POA: Diagnosis not present

## 2017-11-04 DIAGNOSIS — R042 Hemoptysis: Secondary | ICD-10-CM | POA: Diagnosis not present

## 2017-11-04 NOTE — Telephone Encounter (Signed)
Try calling Danielle Taylor's cell- husband, takes care of appts for them

## 2017-11-05 ENCOUNTER — Other Ambulatory Visit: Payer: Self-pay | Admitting: Specialist

## 2017-11-05 DIAGNOSIS — R05 Cough: Secondary | ICD-10-CM

## 2017-11-05 DIAGNOSIS — R0602 Shortness of breath: Secondary | ICD-10-CM

## 2017-11-05 DIAGNOSIS — J8489 Other specified interstitial pulmonary diseases: Secondary | ICD-10-CM

## 2017-11-05 DIAGNOSIS — R053 Chronic cough: Secondary | ICD-10-CM

## 2017-11-08 ENCOUNTER — Other Ambulatory Visit: Payer: Self-pay

## 2017-11-11 DIAGNOSIS — M9904 Segmental and somatic dysfunction of sacral region: Secondary | ICD-10-CM | POA: Diagnosis not present

## 2017-11-11 DIAGNOSIS — M9903 Segmental and somatic dysfunction of lumbar region: Secondary | ICD-10-CM | POA: Diagnosis not present

## 2017-11-11 DIAGNOSIS — M5442 Lumbago with sciatica, left side: Secondary | ICD-10-CM | POA: Diagnosis not present

## 2017-11-11 DIAGNOSIS — M9902 Segmental and somatic dysfunction of thoracic region: Secondary | ICD-10-CM | POA: Diagnosis not present

## 2017-11-15 ENCOUNTER — Ambulatory Visit (INDEPENDENT_AMBULATORY_CARE_PROVIDER_SITE_OTHER): Payer: Medicare Other

## 2017-11-15 VITALS — BP 134/70 | HR 70 | Temp 98.7°F | Resp 12 | Ht 64.0 in | Wt 204.0 lb

## 2017-11-15 DIAGNOSIS — Z0000 Encounter for general adult medical examination without abnormal findings: Secondary | ICD-10-CM | POA: Diagnosis not present

## 2017-11-15 DIAGNOSIS — Z1159 Encounter for screening for other viral diseases: Secondary | ICD-10-CM

## 2017-11-15 DIAGNOSIS — Z1231 Encounter for screening mammogram for malignant neoplasm of breast: Secondary | ICD-10-CM

## 2017-11-15 DIAGNOSIS — Z1239 Encounter for other screening for malignant neoplasm of breast: Secondary | ICD-10-CM

## 2017-11-15 DIAGNOSIS — E2839 Other primary ovarian failure: Secondary | ICD-10-CM | POA: Diagnosis not present

## 2017-11-15 DIAGNOSIS — Z23 Encounter for immunization: Secondary | ICD-10-CM | POA: Diagnosis not present

## 2017-11-15 NOTE — Progress Notes (Signed)
Subjective:   Danielle Taylor is a 72 y.o. female who presents for Medicare Annual (Subsequent) preventive examination.  Review of Systems:  N/A Cardiac Risk Factors include: advanced age (>71men, >32 women);dyslipidemia;hypertension;obesity (BMI >30kg/m2)     Objective:     Vitals: BP 134/70 (BP Location: Right Arm, Patient Position: Sitting, Cuff Size: Large)   Pulse 70   Temp 98.7 F (37.1 C) (Oral)   Resp 12   Ht 5\' 4"  (1.626 m)   Wt 204 lb (92.5 kg)   SpO2 96%   BMI 35.02 kg/m   Body mass index is 35.02 kg/m.  Advanced Directives 11/15/2017 03/11/2016 04/26/2015 03/04/2015  Does Patient Have a Medical Advance Directive? No No No No  Would patient like information on creating a medical advance directive? Yes (MAU/Ambulatory/Procedural Areas - Information given) - - No - patient declined information    Tobacco Social History   Tobacco Use  Smoking Status Former Smoker  . Packs/day: 0.50  . Years: 33.00  . Pack years: 16.50  . Types: Cigarettes  . Last attempt to quit: 2003  . Years since quitting: 16.2  Smokeless Tobacco Never Used  Tobacco Comment   smoking cessation materials not required     Counseling given: No Comment: smoking cessation materials not required   Clinical Intake:  Pre-visit preparation completed: Yes  Pain : No/denies pain   BMI - recorded: 35.02 Nutritional Status: BMI > 30  Obese Nutritional Risks: None Diabetes: No  How often do you need to have someone help you when you read instructions, pamphlets, or other written materials from your doctor or pharmacy?: 1 - Never  Interpreter Needed?: No  Information entered by :: AEversole, LPN  Past Medical History:  Diagnosis Date  . Allergy   . COPD (chronic obstructive pulmonary disease) ([place])   . Depression   . Dysrhythmia    Complete Heart Block  . Gastritis, chronic   . GERD (gastroesophageal reflux disease)   . Graves disease   . History of hiatal hernia   .  Hyperlipidemia   . Hypertension   . Hypothyroidism   . Presence of permanent cardiac pacemaker    2012   Past Surgical History:  Procedure Laterality Date  . APPENDECTOMY    . COLONOSCOPY  2000  . COLONOSCOPY WITH PROPOFOL N/A 04/26/2015   Procedure: COLONOSCOPY WITH PROPOFOL;  Surgeon: Hulen Luster, MD;  Location: [facility] ENDOSCOPY;  Service: Gastroenterology;  Laterality: N/A;  . fibroid tumors     fingers and wrist  . FOOT SURGERY    . TONSILLECTOMY    . VAGINAL HYSTERECTOMY     Family History  Problem Relation Age of Onset  . Dementia Mother   . Heart disease Father   . Diabetes Sister    Social History   Socioeconomic History  . Marital status: Married    Spouse name: Not on file  . Number of children: 2  . Years of education: Not on file  . Highest education level: 12th grade  Occupational History  . Occupation: Retired  Scientific laboratory technician  . Financial resource strain: Not hard at all  . Food insecurity:    Worry: Never true    Inability: Never true  . Transportation needs:    Medical: No    Non-medical: No  Tobacco Use  . Smoking status: Former Smoker    Packs/day: 0.50    Years: 33.00    Pack years: 16.50    Types: Cigarettes  Last attempt to quit: 2003    Years since quitting: 16.2  . Smokeless tobacco: Never Used  . Tobacco comment: smoking cessation materials not required  Substance and Sexual Activity  . Alcohol use: Not Currently    Alcohol/week: 0.0 oz  . Drug use: No  . Sexual activity: Not Currently  Lifestyle  . Physical activity:    Days per week: 5 days    Minutes per session: 10 min  . Stress: Not at all  Relationships  . Social connections:    Talks on phone: Patient refused    Gets together: Patient refused    Attends religious service: Patient refused    Active member of club or organization: Patient refused    Attends meetings of clubs or organizations: Patient refused    Relationship status: Married  Other Topics Concern  . Not on  file  Social History Narrative  . Not on file    Outpatient Encounter Medications as of 11/15/2017  Medication Sig  . albuterol (PROVENTIL HFA;VENTOLIN HFA) 108 (90 Base) MCG/ACT inhaler Inhale 1-2 puffs into the lungs every 6 (six) hours as needed for wheezing or shortness of breath.  Marland Kitchen albuterol (PROVENTIL) (2.5 MG/3ML) 0.083% nebulizer solution Take 3 mLs (2.5 mg total) by nebulization every 6 (six) hours as needed for wheezing or shortness of breath.  . citalopram (CELEXA) 40 MG tablet Take 1 tablet (40 mg total) by mouth daily.  Marland Kitchen gemfibrozil (LOPID) 600 MG tablet Take 1 tablet (600 mg total) by mouth daily.  Marland Kitchen ipratropium-albuterol (DUONEB) 0.5-2.5 (3) MG/3ML SOLN Take 3 mLs by nebulization every 6 (six) hours as needed.  Marland Kitchen levothyroxine (SYNTHROID, LEVOTHROID) 112 MCG tablet *CALL & SCHEDULE APPT FOR MEDS** TAKE 1 TABLET EVERY DAY  . loratadine (CLARITIN) 10 MG tablet Take 1 tablet (10 mg total) by mouth daily.  Marland Kitchen losartan (COZAAR) 50 MG tablet Take 1 tablet (50 mg total) by mouth daily.  . metoprolol succinate (TOPROL-XL) 50 MG 24 hr tablet TAKE 1 TABLET (50 MG TOTAL) BY MOUTH DAILY. *NEED TO SCHEDULE APPT FOR MEDS AND LABS**  . montelukast (SINGULAIR) 10 MG tablet Take 1 tablet (10 mg total) by mouth at bedtime.  . Multiple Vitamins-Minerals (CENTRUM SILVER PO) Take 1 tablet by mouth daily.  . rivaroxaban (XARELTO) 20 MG TABS tablet Take 1 tablet by mouth daily. Dr Allena Earing at [facility]  . simvastatin (ZOCOR) 20 MG tablet Take 1 tablet (20 mg total) by mouth daily.  . traMADol (ULTRAM) 50 MG tablet TAKE ONE TABLET BY MOUTH EVERY 6 HOURS AS NEEDED FOR PAIN FOR UP TO 5 DAYS  . [DISCONTINUED] guaiFENesin-codeine (ROBITUSSIN AC) 100-10 MG/5ML syrup Take 5 mLs by mouth 3 (three) times daily as needed for cough.  . [DISCONTINUED] levofloxacin (LEVAQUIN) 500 MG tablet Take 1 tablet (500 mg total) by mouth daily.  . [DISCONTINUED] sulfamethoxazole-trimethoprim (BACTRIM DS,SEPTRA DS) 800-160 MG  tablet Take 1 tablet by mouth 2 (two) times daily.   Facility-Administered Encounter Medications as of 11/15/2017  Medication  . albuterol (PROVENTIL) (2.5 MG/3ML) 0.083% nebulizer solution 2.5 mg  . ipratropium-albuterol (DUONEB) 0.5-2.5 (3) MG/3ML nebulizer solution 3 mL    Activities of Daily Living In your present state of health, do you have any difficulty performing the following activities: 11/15/2017  Hearing? N  Comment denies hearing aids  Vision? N  Comment wears eyeglasses  Difficulty concentrating or making decisions? Y  Comment short term memory loss  Walking or climbing stairs? N  Dressing or bathing? N  Doing errands, shopping? N  Preparing Food and eating ? N  Comment denies dentures  Using the Toilet? N  In the past six months, have you accidently leaked urine? N  Do you have problems with loss of bowel control? N  Managing your Medications? N  Managing your Finances? N  Housekeeping or managing your Housekeeping? N  Some recent data might be hidden    Patient Care Team: Juline Patch, MD as PCP - General (Family Medicine) Erby Pian, MD as Consulting Physician (Specialist)    Assessment:   This is a routine wellness examination for Danielle Taylor.  Exercise Activities and Dietary recommendations Current Exercise Habits: Home exercise routine, Type of exercise: Other - see comments(stationary bike), Time (Minutes): 10, Frequency (Times/Week): 5, Weekly Exercise (Minutes/Week): 50, Intensity: Mild, Exercise limited by: None identified  Goals    . DIET - INCREASE WATER INTAKE     Recommend to drink at least 6-8 8oz glasses of water per day.       Fall Risk Fall Risk  11/15/2017 03/15/2017 02/08/2017 12/24/2015 10/16/2015  Falls in the past year? No Yes No No No  Comment - Emmi Telephone Survey: data to providers prior to load - - -  Number falls in past yr: - 2 or more - - -  Comment - Emmi Telephone Survey Actual Response = 2 - - -  Injury with Fall? -  No - - -  Risk for fall due to : History of fall(s);Impaired vision - - - -  Risk for fall due to: Comment syncope; wears eyeglasses - - - -   Is the home free of loose throw rugs in walkways, pet beds, electrical cords, etc? Yes Adequate lighting to reduce risk of falls?  Yes In addition, does the patient have any of the following: Stairs in or around the home WITH handrails? Yes Grab bars in the bathroom? No  Shower chair or a place to sit while bathing? No Use of a cane, walker or w/c? No Use of an elevated toilet seat or a handicapped toilet? No  Timed Get Up and Go Performed: Yes. Pt ambulated 10 feet within 12 sec. Gait slow, steady and without the use of an assistive device. No intervention required at this time. Fall risk prevention has been discussed.  Pt declined my offer to send Community Resource Referral to Care Guide for installation of grab bars in the shower, shower chair or an elevated toilet seat.  Depression Screen PHQ 2/9 Scores 11/15/2017 10/29/2017 02/08/2017 12/24/2015  PHQ - 2 Score 0 0 0 0  PHQ- 9 Score 0 0 - -     Cognitive Function     6CIT Screen 11/15/2017  What Year? 0 points  What month? 0 points  What time? 0 points  Count back from 20 0 points  Months in reverse 0 points  Repeat phrase 0 points  Total Score 0    Immunization History  Administered Date(s) Administered  . Influenza Nasal 06/08/2017  . Influenza, Seasonal, Injecte, Preservative Fre 05/28/2011, 06/21/2012  . Influenza,inj,Quad PF,6+ Mos 07/15/2015, 05/28/2016  . Influenza-Unspecified 05/24/2014, 07/15/2015, 05/28/2016  . PPD Test 04/05/2017  . Pneumococcal Conjugate-13 10/22/2014  . Pneumococcal Polysaccharide-23 11/15/2017    Qualifies for Shingles Vaccine? Yes. Due for Zostavax or Shingrix vaccine. Education has been provided regarding the importance of this vaccine. Pt has been advised to call her insurance company to determine her out of pocket expense. Advised she may also  receive this vaccine  at her local pharmacy or Health Dept. Verbalized acceptance and understanding.  Due for Tdap vaccine. Education has been provided regarding the importance of this vaccine. Pt has been advised she may receive this vaccine at her local pharmacy or Health Dept. Also advised to provide a copy of her vaccination record if she chooses to receive this vaccine at her local pharmacy. Verbalized acceptance and understanding.  Screening Tests Health Maintenance  Topic Date Due  . Hepatitis C Screening  Jul 21, 1946  . TETANUS/TDAP  07/03/1967  . DEXA SCAN  07/02/2013  . MAMMOGRAM  10/24/2015  . COLONOSCOPY  04/25/2025  . INFLUENZA VACCINE  Completed  . PNA vac Low Risk Adult  Completed    Cancer Screenings: Lung: Low Dose CT Chest recommended if Age 72-80 years, 30 pack-year currently smoking OR have quit w/in 15years. Patient does not qualify. Breast:  Up to date on Mammogram? No. Completed 11/17/14. Repeat every year. Ordered today. Pt aware that she will receive a call from our office re: her appt.   Up to date of Bone Density/Dexa? No. Ordered today. Pt aware that she will receive a call from our office re: her appt.  Colorectal: Completed colonoscopy 04/26/15. Repeat every 10 years.  Additional Screenings: Hepatitis B/HIV/Syphillis: Does not qualify Hepatitis C Screening: Ordered today. Pt provided with lab req for completion.     Plan:  I have personally reviewed and addressed the Medicare Annual Wellness questionnaire and have noted the following in the patient's chart:  A. Medical and social history B. Use of alcohol, tobacco or illicit drugs  C. Current medications and supplements D. Functional ability and status E.  Nutritional status F.  Physical activity G. Advance directives H. List of other physicians I.  Hospitalizations, surgeries, and ER visits in previous 12 months J.  [place] such as hearing and vision if needed, cognitive and  depression L. Referrals and appointments - none  In addition, I have reviewed and discussed with patient certain preventive protocols, quality metrics, and best practice recommendations. A written personalized care plan for preventive services as well as general preventive health recommendations were provided to patient.  Signed,  Aleatha Borer, LPN Nurse Health Advisor  MD Recommendations: Due for Zostavax or Shingrix vaccine. Education has been provided regarding the importance of this vaccine. Pt has been advised to call her insurance company to determine her out of pocket expense. Advised she may also receive this vaccine at her local pharmacy or Health Dept. Verbalized acceptance and understanding.  Due for Tdap vaccine. Education has been provided regarding the importance of this vaccine. Pt has been advised she may receive this vaccine at her local pharmacy or Health Dept. Also advised to provide a copy of her vaccination record if she chooses to receive this vaccine at her local pharmacy. Verbalized acceptance and understanding.  Due for mammogram. Completed 11/17/14. Repeat every year. Ordered today. Pt aware that she will receive a call from our office re: her appt.    Overdue for DEXA. Ordered today. Pt aware that she will receive a call from our office re: her appt.   Due for Hep C screening. Ordered today. Pt provided with lab req for completion.

## 2017-11-15 NOTE — Patient Instructions (Signed)
Ms. Danielle Taylor , Thank you for taking time to come for your Medicare Wellness Visit. I appreciate your ongoing commitment to your health goals. Please review the following plan we discussed and let me know if I can assist you in the future.   Screening recommendations/referrals: Colorectal Screening: Completed colonoscopy 04/26/15. Repeat every 10 years. Mammogram: Completed 11/17/14. Repeat every year. Ordered today. You will receive a call from our office regarding your appointment.   Bone Density: Ordered today. You will receive a call from our office regarding your appointment.  Vision and Dental Exams: Recommended annual ophthalmology exams for early detection of glaucoma and other disorders of the eye Recommended annual dental exams for proper oral hygiene  Vaccinations: Influenza vaccine: Up to date Pneumococcal vaccine: Completed today Tdap vaccine: Declined. Please call your insurance company to determine your out of pocket expense. You may also receive this vaccine at your local pharmacy or Health Dept. Shingles vaccine: Please call your insurance company to determine your out of pocket expense for the Shingrix vaccine. You may also receive this vaccine at your local pharmacy or Health Dept.    Advanced directives: Advance directive discussed with you today. I have provided a copy for you to complete at home and have notarized. Once this is complete please bring a copy in to our office so we can scan it into your chart.  Conditions/risks identified: Recommend to drink at least 6-8 8oz glasses of water per day.  Next appointment: Please schedule your Annual Wellness Visit with your Nurse Health Advisor in one year.  Preventive Care 54 Years and Older, Female Preventive care refers to lifestyle choices and visits with your health care provider that can promote health and wellness. What does preventive care include?  A yearly physical exam. This is also called an annual well  check.  Dental exams once or twice a year.  Routine eye exams. Ask your health care provider how often you should have your eyes checked.  Personal lifestyle choices, including:  Daily care of your teeth and gums.  Regular physical activity.  Eating a healthy diet.  Avoiding tobacco and drug use.  Limiting alcohol use.  Practicing safe sex.  Taking low-dose aspirin every day.  Taking vitamin and mineral supplements as recommended by your health care provider. What happens during an annual well check? The services and screenings done by your health care provider during your annual well check will depend on your age, overall health, lifestyle risk factors, and family history of disease. Counseling  Your health care provider may ask you questions about your:  Alcohol use.  Tobacco use.  Drug use.  Emotional well-being.  Home and relationship well-being.  Sexual activity.  Eating habits.  History of falls.  Memory and ability to understand (cognition).  Work and work Statistician.  Reproductive health. Screening  You may have the following tests or measurements:  Height, weight, and BMI.  Blood pressure.  Lipid and cholesterol levels. These may be checked every 5 years, or more frequently if you are over 22 years old.  Skin check.  Lung cancer screening. You may have this screening every year starting at age 71 if you have a 30-pack-year history of smoking and currently smoke or have quit within the past 15 years.  Fecal occult blood test (FOBT) of the stool. You may have this test every year starting at age 25.  Flexible sigmoidoscopy or colonoscopy. You may have a sigmoidoscopy every 5 years or a colonoscopy every 10 years  starting at age 65.  Hepatitis C blood test.  Hepatitis B blood test.  Sexually transmitted disease (STD) testing.  Diabetes screening. This is done by checking your blood sugar (glucose) after you have not eaten for a while  (fasting). You may have this done every 1-3 years.  Bone density scan. This is done to screen for osteoporosis. You may have this done starting at age 32.  Mammogram. This may be done every 1-2 years. Talk to your health care provider about how often you should have regular mammograms. Talk with your health care provider about your test results, treatment options, and if necessary, the need for more tests. Vaccines  Your health care provider may recommend certain vaccines, such as:  Influenza vaccine. This is recommended every year.  Tetanus, diphtheria, and acellular pertussis (Tdap, Td) vaccine. You may need a Td booster every 10 years.  Zoster vaccine. You may need this after age 37.  Pneumococcal 13-valent conjugate (PCV13) vaccine. One dose is recommended after age 36.  Pneumococcal polysaccharide (PPSV23) vaccine. One dose is recommended after age 45. Talk to your health care provider about which screenings and vaccines you need and how often you need them. This information is not intended to replace advice given to you by your health care provider. Make sure you discuss any questions you have with your health care provider. Document Released: 09/06/2015 Document Revised: 04/29/2016 Document Reviewed: 06/11/2015 Elsevier Interactive Patient Education  2017 [place] Prevention in the Home Falls can cause injuries. They can happen to people of all ages. There are many things you can do to make your home safe and to help prevent falls. What can I do on the outside of my home?  Regularly fix the edges of walkways and driveways and fix any cracks.  Remove anything that might make you trip as you walk through a door, such as a raised step or threshold.  Trim any bushes or trees on the path to your home.  Use bright outdoor lighting.  Clear any walking paths of anything that might make someone trip, such as rocks or tools.  Regularly check to see if handrails are loose  or broken. Make sure that both sides of any steps have handrails.  Any raised decks and porches should have guardrails on the edges.  Have any leaves, snow, or ice cleared regularly.  Use sand or salt on walking paths during winter.  Clean up any spills in your garage right away. This includes oil or grease spills. What can I do in the bathroom?  Use night lights.  Install grab bars by the toilet and in the tub and shower. Do not use towel bars as grab bars.  Use non-skid mats or decals in the tub or shower.  If you need to sit down in the shower, use a plastic, non-slip stool.  Keep the floor dry. Clean up any water that spills on the floor as soon as it happens.  Remove soap buildup in the tub or shower regularly.  Attach bath mats securely with double-sided non-slip rug tape.  Do not have throw rugs and other things on the floor that can make you trip. What can I do in the bedroom?  Use night lights.  Make sure that you have a light by your bed that is easy to reach.  Do not use any sheets or blankets that are too big for your bed. They should not hang down onto the floor.  Have a  firm chair that has side arms. You can use this for support while you get dressed.  Do not have throw rugs and other things on the floor that can make you trip. What can I do in the kitchen?  Clean up any spills right away.  Avoid walking on wet floors.  Keep items that you use a lot in easy-to-reach places.  If you need to reach something above you, use a strong step stool that has a grab bar.  Keep electrical cords out of the way.  Do not use floor polish or wax that makes floors slippery. If you must use wax, use non-skid floor wax.  Do not have throw rugs and other things on the floor that can make you trip. What can I do with my stairs?  Do not leave any items on the stairs.  Make sure that there are handrails on both sides of the stairs and use them. Fix handrails that are  broken or loose. Make sure that handrails are as long as the stairways.  Check any carpeting to make sure that it is firmly attached to the stairs. Fix any carpet that is loose or worn.  Avoid having throw rugs at the top or bottom of the stairs. If you do have throw rugs, attach them to the floor with carpet tape.  Make sure that you have a light switch at the top of the stairs and the bottom of the stairs. If you do not have them, ask someone to add them for you. What else can I do to help prevent falls?  Wear shoes that:  Do not have high heels.  Have rubber bottoms.  Are comfortable and fit you well.  Are closed at the toe. Do not wear sandals.  If you use a stepladder:  Make sure that it is fully opened. Do not climb a closed stepladder.  Make sure that both sides of the stepladder are locked into place.  Ask someone to hold it for you, if possible.  Clearly mark and make sure that you can see:  Any grab bars or handrails.  First and last steps.  Where the edge of each step is.  Use tools that help you move around (mobility aids) if they are needed. These include:  Canes.  Walkers.  Scooters.  Crutches.  Turn on the lights when you go into a dark area. Replace any light bulbs as soon as they burn out.  Set up your furniture so you have a clear path. Avoid moving your furniture around.  If any of your floors are uneven, fix them.  If there are any pets around you, be aware of where they are.  Review your medicines with your doctor. Some medicines can make you feel dizzy. This can increase your chance of falling. Ask your doctor what other things that you can do to help prevent falls. This information is not intended to replace advice given to you by your health care provider. Make sure you discuss any questions you have with your health care provider. Document Released: 06/06/2009 Document Revised: 01/16/2016 Document Reviewed: 09/14/2014 Elsevier  Interactive Patient Education  2017 [employer].

## 2017-11-17 ENCOUNTER — Ambulatory Visit
Admission: RE | Admit: 2017-11-17 | Discharge: 2017-11-17 | Disposition: A | Discharge status: Home / Self Care | Payer: Medicare Other | Source: Ambulatory Visit | Attending: Specialist | Admitting: Specialist

## 2017-11-17 DIAGNOSIS — K760 Fatty (change of) liver, not elsewhere classified: Secondary | ICD-10-CM | POA: Insufficient documentation

## 2017-11-17 DIAGNOSIS — I2510 Atherosclerotic heart disease of native coronary artery without angina pectoris: Secondary | ICD-10-CM | POA: Diagnosis not present

## 2017-11-17 DIAGNOSIS — R05 Cough: Secondary | ICD-10-CM | POA: Insufficient documentation

## 2017-11-17 DIAGNOSIS — J432 Centrilobular emphysema: Secondary | ICD-10-CM | POA: Insufficient documentation

## 2017-11-17 DIAGNOSIS — I700 Atherosclerosis of aorta: Secondary | ICD-10-CM | POA: Diagnosis not present

## 2017-11-17 DIAGNOSIS — R918 Other nonspecific abnormal finding of lung field: Secondary | ICD-10-CM | POA: Diagnosis not present

## 2017-11-17 DIAGNOSIS — J8489 Other specified interstitial pulmonary diseases: Secondary | ICD-10-CM

## 2017-11-17 DIAGNOSIS — R0602 Shortness of breath: Secondary | ICD-10-CM | POA: Diagnosis not present

## 2017-11-17 DIAGNOSIS — R053 Chronic cough: Secondary | ICD-10-CM

## 2017-11-17 DIAGNOSIS — J439 Emphysema, unspecified: Secondary | ICD-10-CM | POA: Diagnosis not present

## 2017-11-18 ENCOUNTER — Ambulatory Visit (INDEPENDENT_AMBULATORY_CARE_PROVIDER_SITE_OTHER): Payer: Medicare Other | Admitting: Family Medicine

## 2017-11-18 ENCOUNTER — Encounter: Payer: Self-pay | Admitting: Family Medicine

## 2017-11-18 VITALS — BP 130/80 | HR 80 | Temp 98.3°F | Ht 64.0 in | Wt 204.0 lb

## 2017-11-18 DIAGNOSIS — J40 Bronchitis, not specified as acute or chronic: Secondary | ICD-10-CM | POA: Diagnosis not present

## 2017-11-18 DIAGNOSIS — J020 Streptococcal pharyngitis: Secondary | ICD-10-CM | POA: Diagnosis not present

## 2017-11-18 LAB — POCT RAPID STREP A (OFFICE): Rapid Strep A Screen: POSITIVE — AB

## 2017-11-18 MED ORDER — AZITHROMYCIN 250 MG PO TABS: ORAL_TABLET | ORAL | 0 refills | Status: DC

## 2017-11-18 NOTE — Progress Notes (Signed)
Name: Danielle Taylor   MRN: [id number]    DOB: 11-16-1945   Date:11/18/2017       Progress Note  Subjective  Chief Complaint  Chief Complaint  Patient presents with  . Cough    yellow production, sore throat    Cough  This is a new problem. The current episode started in the past 7 days (monday night). The problem has been gradually worsening. The cough is non-productive. Associated symptoms include a sore throat. Pertinent negatives include no chest pain, chills, ear congestion, ear pain, fever, headaches, heartburn, hemoptysis, myalgias, nasal congestion, postnasal drip, rash, rhinorrhea, shortness of breath, sweats, weight loss or wheezing. The symptoms are aggravated by pollens. Her past medical history is significant for environmental allergies.  Sore Throat   This is a new problem. The current episode started in the past 7 days. The problem has been gradually worsening. Neither side of throat is experiencing more pain than the other. There has been no fever. The pain is at a severity of 5/10. The pain is moderate. Associated symptoms include coughing, a hoarse voice, a plugged ear sensation, neck pain and swollen glands. Pertinent negatives include no abdominal pain, congestion, diarrhea, drooling, ear discharge, ear pain, headaches, shortness of breath, stridor, trouble swallowing or vomiting. She has had exposure to strep. She has had no exposure to mono. She has tried acetaminophen for the symptoms. The treatment provided moderate relief.    No problem-specific Assessment & Plan notes found for this encounter.   Past Medical History:  Diagnosis Date  . Allergy   . COPD (chronic obstructive pulmonary disease) ([place])   . Depression   . Dysrhythmia    Complete Heart Block  . Gastritis, chronic   . GERD (gastroesophageal reflux disease)   . Graves disease   . History of hiatal hernia   . Hyperlipidemia   . Hypertension   . Hypothyroidism   . Presence of permanent cardiac  pacemaker    2012    Past Surgical History:  Procedure Laterality Date  . APPENDECTOMY    . COLONOSCOPY  2000  . COLONOSCOPY WITH PROPOFOL N/A 04/26/2015   Procedure: COLONOSCOPY WITH PROPOFOL;  Surgeon: Hulen Luster, MD;  Location: [facility] ENDOSCOPY;  Service: Gastroenterology;  Laterality: N/A;  . fibroid tumors     fingers and wrist  . FOOT SURGERY    . TONSILLECTOMY    . VAGINAL HYSTERECTOMY      Family History  Problem Relation Age of Onset  . Dementia Mother   . Heart disease Father   . Diabetes Sister     Social History   Socioeconomic History  . Marital status: Married    Spouse name: Not on file  . Number of children: 2  . Years of education: Not on file  . Highest education level: 12th grade  Occupational History  . Occupation: Retired  Scientific laboratory technician  . Financial resource strain: Not hard at all  . Food insecurity:    Worry: Never true    Inability: Never true  . Transportation needs:    Medical: No    Non-medical: No  Tobacco Use  . Smoking status: Former Smoker    Packs/day: 0.50    Years: 33.00    Pack years: 16.50    Types: Cigarettes    Last attempt to quit: 2003    Years since quitting: 16.2  . Smokeless tobacco: Never Used  . Tobacco comment: smoking cessation materials not required  Substance and Sexual Activity  .  Alcohol use: Not Currently    Alcohol/week: 0.0 oz  . Drug use: No  . Sexual activity: Not Currently  Lifestyle  . Physical activity:    Days per week: 5 days    Minutes per session: 10 min  . Stress: Not at all  Relationships  . Social connections:    Talks on phone: Patient refused    Gets together: Patient refused    Attends religious service: Patient refused    Active member of club or organization: Patient refused    Attends meetings of clubs or organizations: Patient refused    Relationship status: Married  . Intimate partner violence:    Fear of current or ex partner: No    Emotionally abused: No    Physically abused:  No    Forced sexual activity: No  Other Topics Concern  . Not on file  Social History Narrative  . Not on file    Allergies  Allergen Reactions  . Penicillins Other (See Comments)    Has patient had a PCN reaction causing immediate rash, facial/tongue/throat swelling, SOB or lightheadedness with hypotension: No Has patient had a PCN reaction causing severe rash involving mucus membranes or skin necrosis: No Has patient had a PCN reaction that required hospitalization No Has patient had a PCN reaction occurring within the last 10 years: No If all of the above answers are "NO", then may proceed with Cephalosporin use.     Outpatient Medications Prior to Visit  Medication Sig Dispense Refill  . albuterol (PROVENTIL HFA;VENTOLIN HFA) 108 (90 Base) MCG/ACT inhaler Inhale 1-2 puffs into the lungs every 6 (six) hours as needed for wheezing or shortness of breath. 1 Inhaler 11  . albuterol (PROVENTIL) (2.5 MG/3ML) 0.083% nebulizer solution Take 3 mLs (2.5 mg total) by nebulization every 6 (six) hours as needed for wheezing or shortness of breath. 75 mL 12  . citalopram (CELEXA) 40 MG tablet Take 1 tablet (40 mg total) by mouth daily. 30 tablet 6  . gemfibrozil (LOPID) 600 MG tablet Take 1 tablet (600 mg total) by mouth daily. 30 tablet 6  . ipratropium-albuterol (DUONEB) 0.5-2.5 (3) MG/3ML SOLN Take 3 mLs by nebulization every 6 (six) hours as needed. 360 mL 2  . levothyroxine (SYNTHROID, LEVOTHROID) 112 MCG tablet *CALL & SCHEDULE APPT FOR MEDS** TAKE 1 TABLET EVERY DAY 30 tablet 6  . loratadine (CLARITIN) 10 MG tablet Take 1 tablet (10 mg total) by mouth daily. 30 tablet 11  . losartan (COZAAR) 50 MG tablet Take 1 tablet (50 mg total) by mouth daily. 90 tablet 1  . metoprolol succinate (TOPROL-XL) 50 MG 24 hr tablet TAKE 1 TABLET (50 MG TOTAL) BY MOUTH DAILY. *NEED TO SCHEDULE APPT FOR MEDS AND LABS** 30 tablet 6  . montelukast (SINGULAIR) 10 MG tablet Take 1 tablet (10 mg total) by mouth at  bedtime. 30 tablet 6  . Multiple Vitamins-Minerals (CENTRUM SILVER PO) Take 1 tablet by mouth daily.    . rivaroxaban (XARELTO) 20 MG TABS tablet Take 1 tablet by mouth daily. Dr All[facility] Earing at [facility]    . simvastatin (ZOCOR) 20 MG tablet Take 1 tablet (20 mg total) by mouth daily. 30 tablet 6  . traMADol (ULTRAM) 50 MG tablet TAKE ONE TABLET BY MOUTH EVERY 6 HOURS AS NEEDED FOR PAIN FOR UP TO 5 DAYS     Facility-Administered Medications Prior to Visit  Medication Dose Route Frequency Provider Last Rate Last Dose  . albuterol (PROVENTIL) (2.5 MG/3ML) 0.083% nebulizer solution  2.5 mg  2.5 mg Nebulization Once Jones, Deanna C, MD      . ipratropium-albuterol (DUONEB) 0.5-2.5 (3) MG/3ML nebulizer solution 3 mL  3 mL Nebulization Q6H Ronnald Ramp, Deanna C, MD        Review of Systems  Constitutional: Negative for chills, fever, malaise/fatigue and weight loss.  HENT: Positive for hoarse voice and sore throat. Negative for congestion, drooling, ear discharge, ear pain, postnasal drip, rhinorrhea and trouble swallowing.   Eyes: Negative for blurred vision.  Respiratory: Positive for cough. Negative for hemoptysis, sputum production, shortness of breath, wheezing and stridor.   Cardiovascular: Negative for chest pain, palpitations and leg swelling.  Gastrointestinal: Negative for abdominal pain, blood in stool, constipation, diarrhea, heartburn, melena, nausea and vomiting.  Genitourinary: Negative for dysuria, frequency, hematuria and urgency.  Musculoskeletal: Positive for neck pain. Negative for back pain, joint pain and myalgias.  Skin: Negative for rash.  Neurological: Negative for dizziness, tingling, sensory change, focal weakness and headaches.  Endo/Heme/Allergies: Positive for environmental allergies. Negative for polydipsia. Does not bruise/bleed easily.  Psychiatric/Behavioral: Negative for depression and suicidal ideas. The patient is not nervous/anxious and does not have insomnia.       Objective  Vitals:   11/18/17 1612  BP: 130/80  Pulse: 80  Temp: 98.3 F (36.8 C)  TempSrc: Oral  SpO2: 96%  Weight: 204 lb (92.5 kg)  Height: 5\' 4"  (1.626 m)    Physical Exam  Constitutional: She is well-developed, well-nourished, and in no distress. No distress.  HENT:  Head: Normocephalic and atraumatic.  Right Ear: External ear normal.  Left Ear: External ear normal.  Nose: Nose normal.  Mouth/Throat: Oropharynx is clear and moist.  Eyes: Pupils are equal, round, and reactive to light. Conjunctivae and EOM are normal. Right eye exhibits no discharge. Left eye exhibits no discharge.  Neck: Normal range of motion. Neck supple. No JVD present. No thyromegaly present.  Cardiovascular: Normal rate, regular rhythm, normal heart sounds and intact distal pulses. Exam reveals no gallop and no friction rub.  No murmur heard. Pulmonary/Chest: Effort normal and breath sounds normal. She has no wheezes. She has no rales.  Abdominal: Soft. Bowel sounds are normal. She exhibits no mass. There is no tenderness. There is no guarding.  Musculoskeletal: Normal range of motion. She exhibits no edema.  Lymphadenopathy:    She has no cervical adenopathy.  Neurological: She is alert. She has normal reflexes.  Skin: Skin is warm and dry. She is not diaphoretic.  Psychiatric: Mood and affect normal.  Nursing note and vitals reviewed.     Assessment & Plan  Problem List Items Addressed This Visit    None    Visit Diagnoses    Streptococcal pharyngitis    -  Primary   Relevant Medications   azithromycin (ZITHROMAX) 250 MG tablet   Other Relevant Orders   POCT rapid strep A (Completed)   Bronchitis          Meds ordered this encounter  Medications  . azithromycin (ZITHROMAX) 250 MG tablet    Sig: 2 today then 1 a day for 4 days    Dispense:  6 tablet    Refill:  0      Dr. [facility]  11/18/17

## 2017-11-19 DIAGNOSIS — R918 Other nonspecific abnormal finding of lung field: Secondary | ICD-10-CM | POA: Diagnosis not present

## 2017-11-19 DIAGNOSIS — J449 Chronic obstructive pulmonary disease, unspecified: Secondary | ICD-10-CM | POA: Diagnosis not present

## 2017-11-19 DIAGNOSIS — R05 Cough: Secondary | ICD-10-CM | POA: Diagnosis not present

## 2017-12-02 ENCOUNTER — Ambulatory Visit: Payer: Medicare Other

## 2017-12-02 ENCOUNTER — Other Ambulatory Visit: Payer: Self-pay

## 2017-12-02 ENCOUNTER — Other Ambulatory Visit: Payer: Medicare Other

## 2017-12-02 ENCOUNTER — Encounter (INDEPENDENT_AMBULATORY_CARE_PROVIDER_SITE_OTHER): Payer: Self-pay

## 2017-12-02 ENCOUNTER — Ambulatory Visit
Admission: RE | Admit: 2017-12-02 | Discharge: 2017-12-02 | Disposition: A | Discharge status: Home / Self Care | Payer: Medicare Other | Source: Ambulatory Visit | Attending: Family Medicine | Admitting: Family Medicine

## 2017-12-02 ENCOUNTER — Other Ambulatory Visit: Payer: Self-pay | Admitting: Family Medicine

## 2017-12-02 DIAGNOSIS — Z1239 Encounter for other screening for malignant neoplasm of breast: Secondary | ICD-10-CM

## 2017-12-02 DIAGNOSIS — Z780 Asymptomatic menopausal state: Secondary | ICD-10-CM | POA: Diagnosis not present

## 2017-12-02 DIAGNOSIS — M9904 Segmental and somatic dysfunction of sacral region: Secondary | ICD-10-CM | POA: Diagnosis not present

## 2017-12-02 DIAGNOSIS — Z1231 Encounter for screening mammogram for malignant neoplasm of breast: Secondary | ICD-10-CM | POA: Diagnosis not present

## 2017-12-02 DIAGNOSIS — M9903 Segmental and somatic dysfunction of lumbar region: Secondary | ICD-10-CM | POA: Diagnosis not present

## 2017-12-02 DIAGNOSIS — Z13820 Encounter for screening for osteoporosis: Secondary | ICD-10-CM | POA: Diagnosis not present

## 2017-12-02 DIAGNOSIS — E2839 Other primary ovarian failure: Secondary | ICD-10-CM | POA: Diagnosis not present

## 2017-12-02 DIAGNOSIS — M9902 Segmental and somatic dysfunction of thoracic region: Secondary | ICD-10-CM | POA: Diagnosis not present

## 2017-12-02 LAB — HM MAMMOGRAPHY

## 2017-12-30 DIAGNOSIS — M5441 Lumbago with sciatica, right side: Secondary | ICD-10-CM | POA: Diagnosis not present

## 2017-12-30 DIAGNOSIS — M9903 Segmental and somatic dysfunction of lumbar region: Secondary | ICD-10-CM | POA: Diagnosis not present

## 2017-12-30 DIAGNOSIS — M9902 Segmental and somatic dysfunction of thoracic region: Secondary | ICD-10-CM | POA: Diagnosis not present

## 2017-12-30 DIAGNOSIS — M9904 Segmental and somatic dysfunction of sacral region: Secondary | ICD-10-CM | POA: Diagnosis not present

## 2018-02-04 DIAGNOSIS — Z950 Presence of cardiac pacemaker: Secondary | ICD-10-CM | POA: Diagnosis not present

## 2018-02-04 DIAGNOSIS — Z45018 Encounter for adjustment and management of other part of cardiac pacemaker: Secondary | ICD-10-CM | POA: Diagnosis not present

## 2018-02-08 ENCOUNTER — Other Ambulatory Visit: Payer: Self-pay | Admitting: Family Medicine

## 2018-02-08 DIAGNOSIS — F32A Depression, unspecified: Secondary | ICD-10-CM

## 2018-02-08 DIAGNOSIS — F329 Major depressive disorder, single episode, unspecified: Secondary | ICD-10-CM

## 2018-02-10 ENCOUNTER — Encounter: Payer: Self-pay | Admitting: Family Medicine

## 2018-02-10 ENCOUNTER — Ambulatory Visit (INDEPENDENT_AMBULATORY_CARE_PROVIDER_SITE_OTHER): Payer: Medicare Other | Admitting: Family Medicine

## 2018-02-10 ENCOUNTER — Ambulatory Visit
Admission: RE | Admit: 2018-02-10 | Discharge: 2018-02-10 | Disposition: A | Discharge status: Home / Self Care | Payer: Medicare Other | Source: Ambulatory Visit | Attending: Family Medicine | Admitting: Family Medicine

## 2018-02-10 VITALS — BP 120/80 | HR 72 | Ht 64.0 in | Wt 198.0 lb

## 2018-02-10 DIAGNOSIS — M5116 Intervertebral disc disorders with radiculopathy, lumbar region: Secondary | ICD-10-CM

## 2018-02-10 DIAGNOSIS — J432 Centrilobular emphysema: Secondary | ICD-10-CM

## 2018-02-10 DIAGNOSIS — M47896 Other spondylosis, lumbar region: Secondary | ICD-10-CM | POA: Diagnosis not present

## 2018-02-10 DIAGNOSIS — M545 Low back pain: Secondary | ICD-10-CM | POA: Diagnosis not present

## 2018-02-10 DIAGNOSIS — F339 Major depressive disorder, recurrent, unspecified: Secondary | ICD-10-CM

## 2018-02-10 NOTE — Progress Notes (Signed)
Name: Danielle Taylor   MRN: [id number]    DOB: Aug 14, 1946   Date:02/10/2018       Progress Note  Subjective  Chief Complaint  Chief Complaint  Patient presents with  . Back Pain    lower back pain on R) side radiating down R) leg- both feet start tingling.   Marland Kitchen Hypothyroidism    Back Pain  This is a chronic problem. The current episode started more than 1 year ago. The problem occurs intermittently. The problem has been gradually worsening since onset. The pain is present in the lumbar spine. The quality of the pain is described as aching. The pain radiates to the right thigh, right knee and right foot. The pain is at a severity of 8/10. The pain is moderate. The pain is worse during the day. The symptoms are aggravated by sitting, standing and twisting. Associated symptoms include leg pain, numbness and tingling. Pertinent negatives include no abdominal pain, bladder incontinence, bowel incontinence, chest pain, dysuria, fever, headaches, paresis, perianal numbness, weakness or weight loss. She has tried chiropractic manipulation for the symptoms. The treatment provided moderate relief.    Centrilobular emphysema ([place]) Followed by Dr Raul Del  Recurrent major depressive episodes Continue citalopram as directed   Past Medical History:  Diagnosis Date  . Allergy   . COPD (chronic obstructive pulmonary disease) ([place])   . Depression   . Dysrhythmia    Complete Heart Block  . Gastritis, chronic   . GERD (gastroesophageal reflux disease)   . Graves disease   . History of hiatal hernia   . Hyperlipidemia   . Hypertension   . Hypothyroidism   . Presence of permanent cardiac pacemaker    2012    Past Surgical History:  Procedure Laterality Date  . APPENDECTOMY    . COLONOSCOPY  2000  . COLONOSCOPY WITH PROPOFOL N/A 04/26/2015   Procedure: COLONOSCOPY WITH PROPOFOL;  Surgeon: Hulen Luster, MD;  Location: [facility] ENDOSCOPY;  Service: Gastroenterology;  Laterality: N/A;  . fibroid  tumors     fingers and wrist  . FOOT SURGERY    . TONSILLECTOMY    . VAGINAL HYSTERECTOMY      Family History  Problem Relation Age of Onset  . Dementia Mother   . Heart disease Father   . Diabetes Sister   . Breast cancer Neg Hx     Social History   Socioeconomic History  . Marital status: Married    Spouse name: Not on file  . Number of children: 2  . Years of education: Not on file  . Highest education level: 12th grade  Occupational History  . Occupation: Retired  Scientific laboratory technician  . Financial resource strain: Not hard at all  . Food insecurity:    Worry: Never true    Inability: Never true  . Transportation needs:    Medical: No    Non-medical: No  Tobacco Use  . Smoking status: Former Smoker    Packs/day: 0.50    Years: 33.00    Pack years: 16.50    Types: Cigarettes    Last attempt to quit: 2003    Years since quitting: 16.4  . Smokeless tobacco: Never Used  . Tobacco comment: smoking cessation materials not required  Substance and Sexual Activity  . Alcohol use: Not Currently    Alcohol/week: 0.0 oz  . Drug use: No  . Sexual activity: Not Currently  Lifestyle  . Physical activity:    Days per week: 5 days  Minutes per session: 10 min  . Stress: Not at all  Relationships  . Social connections:    Talks on phone: Patient refused    Gets together: Patient refused    Attends religious service: Patient refused    Active member of club or organization: Patient refused    Attends meetings of clubs or organizations: Patient refused    Relationship status: Married  . Intimate partner violence:    Fear of current or ex partner: No    Emotionally abused: No    Physically abused: No    Forced sexual activity: No  Other Topics Concern  . Not on file  Social History Narrative  . Not on file    Allergies  Allergen Reactions  . Penicillins Other (See Comments)    Has patient had a PCN reaction causing immediate rash, facial/tongue/throat swelling, SOB  or lightheadedness with hypotension: No Has patient had a PCN reaction causing severe rash involving mucus membranes or skin necrosis: No Has patient had a PCN reaction that required hospitalization No Has patient had a PCN reaction occurring within the last 10 years: No If all of the above answers are "NO", then may proceed with Cephalosporin use.     Outpatient Medications Prior to Visit  Medication Sig Dispense Refill  . albuterol (PROVENTIL HFA;VENTOLIN HFA) 108 (90 Base) MCG/ACT inhaler Inhale 1-2 puffs into the lungs every 6 (six) hours as needed for wheezing or shortness of breath. 1 Inhaler 11  . albuterol (PROVENTIL) (2.5 MG/3ML) 0.083% nebulizer solution Take 3 mLs (2.5 mg total) by nebulization every 6 (six) hours as needed for wheezing or shortness of breath. 75 mL 12  . citalopram (CELEXA) 40 MG tablet TAKE 1 TABLET BY MOUTH EVERY DAY 90 tablet 0  . gabapentin (NEURONTIN) 100 MG capsule Take by mouth.    Marland Kitchen gemfibrozil (LOPID) 600 MG tablet Take 1 tablet (600 mg total) by mouth daily. 30 tablet 6  . ipratropium-albuterol (DUONEB) 0.5-2.5 (3) MG/3ML SOLN Take 3 mLs by nebulization every 6 (six) hours as needed. 360 mL 2  . levothyroxine (SYNTHROID, LEVOTHROID) 112 MCG tablet *CALL & SCHEDULE APPT FOR MEDS** TAKE 1 TABLET EVERY DAY 30 tablet 6  . loratadine (CLARITIN) 10 MG tablet Take 1 tablet (10 mg total) by mouth daily. 30 tablet 11  . losartan (COZAAR) 50 MG tablet Take 1 tablet (50 mg total) by mouth daily. 90 tablet 1  . metoprolol succinate (TOPROL-XL) 50 MG 24 hr tablet TAKE 1 TABLET (50 MG TOTAL) BY MOUTH DAILY. *NEED TO SCHEDULE APPT FOR MEDS AND LABS** 30 tablet 6  . montelukast (SINGULAIR) 10 MG tablet Take 1 tablet (10 mg total) by mouth at bedtime. 30 tablet 6  . Multiple Vitamin (MULTI-VITAMINS) TABS Take by mouth.    . rivaroxaban (XARELTO) 20 MG TABS tablet Take 1 tablet by mouth daily. Dr Allena Earing at [facility]    . simvastatin (ZOCOR) 20 MG tablet Take 1 tablet (20 mg  total) by mouth daily. 30 tablet 6  . umeclidinium-vilanterol (ANORO ELLIPTA) 62.5-25 MCG/INH AEPB Inhale into the lungs.    . traMADol (ULTRAM) 50 MG tablet TAKE ONE TABLET BY MOUTH EVERY 6 HOURS AS NEEDED FOR PAIN FOR UP TO 5 DAYS    . azithromycin (ZITHROMAX) 250 MG tablet 2 today then 1 a day for 4 days 6 tablet 0  . Multiple Vitamins-Minerals (CENTRUM SILVER PO) Take 1 tablet by mouth daily.     Facility-Administered Medications Prior to Visit  Medication Dose  Route Frequency Provider Last Rate Last Dose  . albuterol (PROVENTIL) (2.5 MG/3ML) 0.083% nebulizer solution 2.5 mg  2.5 mg Nebulization Once Jones, Deanna C, MD      . ipratropium-albuterol (DUONEB) 0.5-2.5 (3) MG/3ML nebulizer solution 3 mL  3 mL Nebulization Q6H Ronnald Ramp, Deanna C, MD        Review of Systems  Constitutional: Negative for chills, fever, malaise/fatigue and weight loss.  HENT: Negative for ear discharge, ear pain and sore throat.   Eyes: Negative for blurred vision.  Respiratory: Negative for cough, sputum production, shortness of breath and wheezing.   Cardiovascular: Negative for chest pain, palpitations and leg swelling.  Gastrointestinal: Negative for abdominal pain, blood in stool, bowel incontinence, constipation, diarrhea, heartburn, melena and nausea.  Genitourinary: Negative for bladder incontinence, dysuria, frequency, hematuria and urgency.  Musculoskeletal: Positive for back pain. Negative for joint pain, myalgias and neck pain.  Skin: Negative for rash.  Neurological: Positive for tingling and numbness. Negative for dizziness, sensory change, focal weakness, weakness and headaches.  Endo/Heme/Allergies: Negative for environmental allergies and polydipsia. Does not bruise/bleed easily.  Psychiatric/Behavioral: Negative for depression and suicidal ideas. The patient is not nervous/anxious and does not have insomnia.      Objective  Vitals:   02/10/18 1105  BP: 120/80  Pulse: 72  Weight: 198 lb  (89.8 kg)  Height: 5\' 4"  (1.626 m)    Physical Exam  Constitutional: No distress.  HENT:  Head: Normocephalic and atraumatic.  Right Ear: External ear normal.  Left Ear: External ear normal.  Nose: Nose normal.  Mouth/Throat: Oropharynx is clear and moist.  Eyes: Pupils are equal, round, and reactive to light. Conjunctivae and EOM are normal. Right eye exhibits no discharge. Left eye exhibits no discharge.  Neck: Normal range of motion. Neck supple. No JVD present. No thyromegaly present.  Cardiovascular: Normal rate, regular rhythm, normal heart sounds and intact distal pulses. Exam reveals no gallop and no friction rub.  No murmur heard. Pulmonary/Chest: Effort normal and breath sounds normal.  Abdominal: Soft. Bowel sounds are normal. She exhibits no mass. There is no tenderness. There is no guarding.  Musculoskeletal: She exhibits no edema.       Lumbar back: She exhibits decreased range of motion, pain and spasm. She exhibits no tenderness.  Lymphadenopathy:    She has no cervical adenopathy.  Neurological: She is alert. She has normal strength and normal reflexes. No sensory deficit.  Reflex Scores:      Tricep reflexes are 2+ on the right side and 2+ on the left side.      Bicep reflexes are 2+ on the right side and 2+ on the left side.      Brachioradialis reflexes are 2+ on the right side and 2+ on the left side.      Patellar reflexes are 2+ on the right side and 2+ on the left side.      Achilles reflexes are 2+ on the right side and 2+ on the left side. Positive SLR  Skin: Skin is warm and dry. She is not diaphoretic.  Nursing note and vitals reviewed.     Assessment & Plan  Problem List Items Addressed This Visit      Respiratory   Centrilobular emphysema ([place])    Followed by Dr Raul Del        Other   Recurrent major depressive episodes ([place])    Continue citalopram as directed       Other Visit Diagnoses  Displacement of lumbar disc with  radiculopathy    -  Primary   sched appt with [facility] for 25th @ 3:30   Relevant Orders   DG Lumbar Spine Complete   Ambulatory referral to Orthopedic Surgery      No orders of the defined types were placed in this encounter.     Dr. [facility]  02/10/18

## 2018-02-10 NOTE — Assessment & Plan Note (Signed)
Followed by Dr Fleming.   

## 2018-02-10 NOTE — Assessment & Plan Note (Signed)
Continue citalopram as directed

## 2018-02-15 DIAGNOSIS — E669 Obesity, unspecified: Secondary | ICD-10-CM | POA: Diagnosis not present

## 2018-02-15 DIAGNOSIS — G8929 Other chronic pain: Secondary | ICD-10-CM | POA: Insufficient documentation

## 2018-02-15 DIAGNOSIS — R05 Cough: Secondary | ICD-10-CM | POA: Diagnosis not present

## 2018-02-15 DIAGNOSIS — M5440 Lumbago with sciatica, unspecified side: Secondary | ICD-10-CM

## 2018-02-15 DIAGNOSIS — M5442 Lumbago with sciatica, left side: Secondary | ICD-10-CM | POA: Diagnosis not present

## 2018-02-15 DIAGNOSIS — M5441 Lumbago with sciatica, right side: Secondary | ICD-10-CM | POA: Diagnosis not present

## 2018-02-16 ENCOUNTER — Other Ambulatory Visit: Payer: Self-pay | Admitting: Unknown Physician Specialty

## 2018-02-16 DIAGNOSIS — M545 Low back pain: Secondary | ICD-10-CM

## 2018-02-22 ENCOUNTER — Ambulatory Visit
Admission: RE | Admit: 2018-02-22 | Discharge: 2018-02-22 | Disposition: A | Discharge status: Home / Self Care | Payer: Medicare Other | Source: Ambulatory Visit | Attending: Unknown Physician Specialty | Admitting: Unknown Physician Specialty

## 2018-02-22 DIAGNOSIS — M545 Low back pain: Secondary | ICD-10-CM | POA: Diagnosis not present

## 2018-02-22 DIAGNOSIS — M48061 Spinal stenosis, lumbar region without neurogenic claudication: Secondary | ICD-10-CM | POA: Insufficient documentation

## 2018-02-22 DIAGNOSIS — I700 Atherosclerosis of aorta: Secondary | ICD-10-CM | POA: Diagnosis not present

## 2018-02-22 DIAGNOSIS — M47816 Spondylosis without myelopathy or radiculopathy, lumbar region: Secondary | ICD-10-CM | POA: Insufficient documentation

## 2018-03-02 ENCOUNTER — Other Ambulatory Visit: Payer: Self-pay | Admitting: Family Medicine

## 2018-03-24 ENCOUNTER — Telehealth: Payer: Self-pay

## 2018-03-24 ENCOUNTER — Other Ambulatory Visit: Payer: Self-pay

## 2018-03-24 DIAGNOSIS — B379 Candidiasis, unspecified: Secondary | ICD-10-CM

## 2018-03-24 DIAGNOSIS — M5416 Radiculopathy, lumbar region: Secondary | ICD-10-CM | POA: Diagnosis not present

## 2018-03-24 DIAGNOSIS — M5136 Other intervertebral disc degeneration, lumbar region: Secondary | ICD-10-CM | POA: Diagnosis not present

## 2018-03-24 DIAGNOSIS — M48062 Spinal stenosis, lumbar region with neurogenic claudication: Secondary | ICD-10-CM | POA: Diagnosis not present

## 2018-03-24 MED ORDER — NYSTATIN 100000 UNIT/GM EX CREA: 1.0000 "application " | TOPICAL_CREAM | Freq: Two times a day (BID) | CUTANEOUS | 0 refills | Status: DC

## 2018-03-24 NOTE — Telephone Encounter (Signed)
Pt called in c/o yeast under breast that Jones has treated with Nystatin cream in the past. Wanted a refill on the cream- sent it into [facility] [place]

## 2018-04-01 ENCOUNTER — Other Ambulatory Visit: Payer: Self-pay | Admitting: Family Medicine

## 2018-04-01 DIAGNOSIS — E782 Mixed hyperlipidemia: Secondary | ICD-10-CM

## 2018-04-01 DIAGNOSIS — E7849 Other hyperlipidemia: Secondary | ICD-10-CM

## 2018-04-26 DIAGNOSIS — M5136 Other intervertebral disc degeneration, lumbar region: Secondary | ICD-10-CM | POA: Diagnosis not present

## 2018-04-26 DIAGNOSIS — M5416 Radiculopathy, lumbar region: Secondary | ICD-10-CM | POA: Diagnosis not present

## 2018-04-26 DIAGNOSIS — M48062 Spinal stenosis, lumbar region with neurogenic claudication: Secondary | ICD-10-CM | POA: Diagnosis not present

## 2018-04-29 ENCOUNTER — Other Ambulatory Visit: Payer: Self-pay | Admitting: Family Medicine

## 2018-04-29 DIAGNOSIS — J4520 Mild intermittent asthma, uncomplicated: Secondary | ICD-10-CM

## 2018-04-29 DIAGNOSIS — J301 Allergic rhinitis due to pollen: Secondary | ICD-10-CM

## 2018-05-01 ENCOUNTER — Other Ambulatory Visit: Payer: Self-pay | Admitting: Family Medicine

## 2018-05-01 DIAGNOSIS — F329 Major depressive disorder, single episode, unspecified: Secondary | ICD-10-CM

## 2018-05-01 DIAGNOSIS — F32A Depression, unspecified: Secondary | ICD-10-CM

## 2018-05-02 ENCOUNTER — Ambulatory Visit: Payer: Self-pay | Admitting: Family Medicine

## 2018-05-02 DIAGNOSIS — E669 Obesity, unspecified: Secondary | ICD-10-CM | POA: Diagnosis not present

## 2018-05-02 DIAGNOSIS — M5441 Lumbago with sciatica, right side: Secondary | ICD-10-CM | POA: Diagnosis not present

## 2018-05-02 DIAGNOSIS — G8929 Other chronic pain: Secondary | ICD-10-CM | POA: Diagnosis not present

## 2018-05-02 DIAGNOSIS — M48061 Spinal stenosis, lumbar region without neurogenic claudication: Secondary | ICD-10-CM | POA: Diagnosis not present

## 2018-05-03 ENCOUNTER — Ambulatory Visit (INDEPENDENT_AMBULATORY_CARE_PROVIDER_SITE_OTHER): Payer: Medicare Other | Admitting: Family Medicine

## 2018-05-03 ENCOUNTER — Encounter: Payer: Self-pay | Admitting: Family Medicine

## 2018-05-03 VITALS — BP 124/60 | HR 72 | Ht 64.0 in | Wt 202.0 lb

## 2018-05-03 DIAGNOSIS — E039 Hypothyroidism, unspecified: Secondary | ICD-10-CM | POA: Diagnosis not present

## 2018-05-03 DIAGNOSIS — R69 Illness, unspecified: Secondary | ICD-10-CM

## 2018-05-03 DIAGNOSIS — F32A Depression, unspecified: Secondary | ICD-10-CM

## 2018-05-03 DIAGNOSIS — Z23 Encounter for immunization: Secondary | ICD-10-CM

## 2018-05-03 DIAGNOSIS — E782 Mixed hyperlipidemia: Secondary | ICD-10-CM | POA: Diagnosis not present

## 2018-05-03 DIAGNOSIS — E7849 Other hyperlipidemia: Secondary | ICD-10-CM

## 2018-05-03 DIAGNOSIS — F329 Major depressive disorder, single episode, unspecified: Secondary | ICD-10-CM

## 2018-05-03 DIAGNOSIS — J301 Allergic rhinitis due to pollen: Secondary | ICD-10-CM | POA: Diagnosis not present

## 2018-05-03 DIAGNOSIS — I10 Essential (primary) hypertension: Secondary | ICD-10-CM | POA: Diagnosis not present

## 2018-05-03 DIAGNOSIS — J4520 Mild intermittent asthma, uncomplicated: Secondary | ICD-10-CM

## 2018-05-03 MED ORDER — CITALOPRAM HYDROBROMIDE 40 MG PO TABS: 40.0000 mg | ORAL_TABLET | Freq: Every day | ORAL | 5 refills | Status: DC

## 2018-05-03 MED ORDER — SIMVASTATIN 20 MG PO TABS: 20.0000 mg | ORAL_TABLET | Freq: Every day | ORAL | 5 refills | Status: DC

## 2018-05-03 MED ORDER — METOPROLOL SUCCINATE ER 50 MG PO TB24: ORAL_TABLET | ORAL | 5 refills | Status: DC

## 2018-05-03 MED ORDER — GEMFIBROZIL 600 MG PO TABS: 600.0000 mg | ORAL_TABLET | Freq: Every day | ORAL | 5 refills | Status: DC

## 2018-05-03 MED ORDER — LOSARTAN POTASSIUM 50 MG PO TABS: 50.0000 mg | ORAL_TABLET | Freq: Every day | ORAL | 5 refills | Status: DC

## 2018-05-03 MED ORDER — LORATADINE 10 MG PO TABS: 10.0000 mg | ORAL_TABLET | Freq: Every day | ORAL | 5 refills | Status: DC

## 2018-05-03 MED ORDER — LEVOTHYROXINE SODIUM 112 MCG PO TABS: ORAL_TABLET | ORAL | 5 refills | Status: DC

## 2018-05-03 MED ORDER — MONTELUKAST SODIUM 10 MG PO TABS: ORAL_TABLET | ORAL | 5 refills | Status: DC

## 2018-05-03 NOTE — Assessment & Plan Note (Signed)
Stable on meds- continue losartan and metoprolol/ draw renal panel

## 2018-05-03 NOTE — Progress Notes (Signed)
Name: Danielle Taylor   MRN: [id number]    DOB: 09-02-1945   Date:05/03/2018       Progress Note  Subjective  Chief Complaint  Chief Complaint  Patient presents with  . Allergic Rhinitis   . Hypertension  . Hyperlipidemia  . Hypothyroidism  . Depression    Hypertension  This is a chronic problem. The current episode started in the past 7 days. The problem is unchanged. The problem is controlled. Pertinent negatives include no anxiety, blurred vision, chest pain, headaches, malaise/fatigue, neck pain, orthopnea, palpitations, peripheral edema, PND, shortness of breath or sweats. There are no associated agents to hypertension. There are no known risk factors for coronary artery disease. Past treatments include beta blockers and angiotensin blockers. The current treatment provides moderate improvement. There are no compliance problems.  There is no history of angina, kidney disease, CAD/MI, CVA, heart failure, left ventricular hypertrophy, PVD or retinopathy. Identifiable causes of hypertension include a thyroid problem. There is no history of chronic renal disease, a hypertension causing med or renovascular disease.  Hyperlipidemia  This is a chronic problem. The current episode started more than 1 year ago. The problem is controlled. Recent lipid tests were reviewed and are normal. She has no history of chronic renal disease, diabetes, hypothyroidism or nephrotic syndrome. Associated symptoms include myalgias. Pertinent negatives include no chest pain, focal weakness or shortness of breath. Current antihyperlipidemic treatment includes statins. The current treatment provides moderate improvement of lipids. There are no compliance problems.  Risk factors for coronary artery disease include post-menopausal and dyslipidemia.  Depression         This is a chronic problem.  The current episode started more than 1 year ago.   The problem has been gradually improving since onset.  Associated  symptoms include myalgias.  Associated symptoms include no decreased concentration, no helplessness, no hopelessness, does not have insomnia, not irritable, no restlessness, no decreased interest, no headaches and no suicidal ideas.     The symptoms are aggravated by nothing.  Past treatments include SSRIs - Selective serotonin reuptake inhibitors (celexa).  Compliance with treatment is good.  Previous treatment provided mild relief.  Past medical history includes thyroid problem.     Pertinent negatives include no hypothyroidism and no anxiety. Thyroid Problem  Presents for follow-up visit. Patient reports no anxiety, constipation, diarrhea, hair loss, heat intolerance, hoarse voice, nail problem, palpitations or weight loss. The symptoms have been stable. Her past medical history is significant for hyperlipidemia. There is no history of diabetes or heart failure.    Depression Stable on citalopram- continue same dosing  Essential (primary) hypertension Stable on meds- continue losartan and metoprolol/ draw renal panel  Familial multiple lipoprotein-type hyperlipidemia Stable on meds- continue simvastatin and gemfibrozil/ draw lipid and hepatic  Hypothyroid Stable on med- draw thyroid level  Reactive airway disease, mild intermittent, uncomplicated Stable on montelukast- refill med  Chronic seasonal allergic rhinitis due to pollen Controlled on med- continue as prescribed   Past Medical History:  Diagnosis Date  . Allergy   . COPD (chronic obstructive pulmonary disease) ([place])   . Depression   . Dysrhythmia    Complete Heart Block  . Gastritis, chronic   . GERD (gastroesophageal reflux disease)   . Graves disease   . History of hiatal hernia   . Hyperlipidemia   . Hypertension   . Hypothyroidism   . Presence of permanent cardiac pacemaker    2012    Past Surgical History:  Procedure Laterality  Date  . APPENDECTOMY    . COLONOSCOPY  2000  . COLONOSCOPY WITH PROPOFOL N/A  04/26/2015   Procedure: COLONOSCOPY WITH PROPOFOL;  Surgeon: Hulen Luster, MD;  Location: [facility] ENDOSCOPY;  Service: Gastroenterology;  Laterality: N/A;  . fibroid tumors     fingers and wrist  . FOOT SURGERY    . TONSILLECTOMY    . VAGINAL HYSTERECTOMY      Family History  Problem Relation Age of Onset  . Dementia Mother   . Heart disease Father   . Diabetes Sister   . Breast cancer Neg Hx     Social History   Socioeconomic History  . Marital status: Married    Spouse name: Not on file  . Number of children: 2  . Years of education: Not on file  . Highest education level: 12th grade  Occupational History  . Occupation: Retired  Scientific laboratory technician  . Financial resource strain: Not hard at all  . Food insecurity:    Worry: Never true    Inability: Never true  . Transportation needs:    Medical: No    Non-medical: No  Tobacco Use  . Smoking status: Former Smoker    Packs/day: 0.50    Years: 33.00    Pack years: 16.50    Types: Cigarettes    Last attempt to quit: 2003    Years since quitting: 16.7  . Smokeless tobacco: Never Used  . Tobacco comment: smoking cessation materials not required  Substance and Sexual Activity  . Alcohol use: Not Currently    Alcohol/week: 0.0 standard drinks  . Drug use: No  . Sexual activity: Not Currently  Lifestyle  . Physical activity:    Days per week: 5 days    Minutes per session: 10 min  . Stress: Not at all  Relationships  . Social connections:    Talks on phone: Patient refused    Gets together: Patient refused    Attends religious service: Patient refused    Active member of club or organization: Patient refused    Attends meetings of clubs or organizations: Patient refused    Relationship status: Married  . Intimate partner violence:    Fear of current or ex partner: No    Emotionally abused: No    Physically abused: No    Forced sexual activity: No  Other Topics Concern  . Not on file  Social History Narrative  . Not on  file    Allergies  Allergen Reactions  . Penicillins Other (See Comments)    Has patient had a PCN reaction causing immediate rash, facial/tongue/throat swelling, SOB or lightheadedness with hypotension: No Has patient had a PCN reaction causing severe rash involving mucus membranes or skin necrosis: No Has patient had a PCN reaction that required hospitalization No Has patient had a PCN reaction occurring within the last 10 years: No If all of the above answers are "NO", then may proceed with Cephalosporin use.     Outpatient Medications Prior to Visit  Medication Sig Dispense Refill  . albuterol (PROVENTIL HFA;VENTOLIN HFA) 108 (90 Base) MCG/ACT inhaler Inhale 1-2 puffs into the lungs every 6 (six) hours as needed for wheezing or shortness of breath. 1 Inhaler 11  . albuterol (PROVENTIL) (2.5 MG/3ML) 0.083% nebulizer solution Take 3 mLs (2.5 mg total) by nebulization every 6 (six) hours as needed for wheezing or shortness of breath. 75 mL 12  . ipratropium-albuterol (DUONEB) 0.5-2.5 (3) MG/3ML SOLN Take 3 mLs by nebulization every  6 (six) hours as needed. 360 mL 2  . Multiple Vitamin (MULTI-VITAMINS) TABS Take by mouth.    . nystatin cream (MYCOSTATIN) Apply 1 application topically 2 (two) times daily. 30 g 0  . rivaroxaban (XARELTO) 20 MG TABS tablet Take 1 tablet by mouth daily. Dr [facility] at [facility]    . umeclidinium-vilanterol [facility] ELLIPTA) 62.5-25 MCG/INH AEPB Inhale into the lungs.    . citalopram (CELEXA) 40 MG tablet TAKE 1 TABLET BY MOUTH EVERY DAY 90 tablet 0  . gemfibrozil (LOPID) 600 MG tablet TAKE 1 TABLET (600 MG TOTAL) BY MOUTH DAILY. 30 tablet 0  . levothyroxine (SYNTHROID, LEVOTHROID) 112 MCG tablet *CALL & SCHEDULE APPT FOR MEDS** TAKE 1 TABLET EVERY DAY 30 tablet 6  . loratadine (CLARITIN) 10 MG tablet TAKE 1 TABLET BY MOUTH EVERY DAY 90 tablet 3  . losartan (COZAAR) 50 MG tablet Take 1 tablet (50 mg total) by mouth daily. 90 tablet 1  . metoprolol succinate  (TOPROL-XL) 50 MG 24 hr tablet TAKE 1 TABLET (50 MG TOTAL) BY MOUTH DAILY. *NEED TO SCHEDULE APPT FOR MEDS AND LABS** 30 tablet 6  . montelukast (SINGULAIR) 10 MG tablet TAKE 1 TABLET BY MOUTH EVERYDAY AT BEDTIME 90 tablet 2  . simvastatin (ZOCOR) 20 MG tablet TAKE 1 TABLET BY MOUTH EVERY DAY 30 tablet 0  . gabapentin (NEURONTIN) 100 MG capsule Take by mouth.    . traMADol (ULTRAM) 50 MG tablet TAKE ONE TABLET BY MOUTH EVERY 6 HOURS AS NEEDED FOR PAIN FOR UP TO 5 DAYS     Facility-Administered Medications Prior to Visit  Medication Dose Route Frequency Provider Last Rate Last Dose  . albuterol (PROVENTIL) (2.5 MG/3ML) 0.083% nebulizer solution 2.5 mg  2.5 mg Nebulization Once Darl Kuss C, MD      . ipratropium-albuterol (DUONEB) 0.5-2.5 (3) MG/3ML nebulizer solution 3 mL  3 mL Nebulization Q6H Ronnald Ramp, Leela Vanbrocklin C, MD        Review of Systems  Constitutional: Negative for chills, fever, malaise/fatigue and weight loss.  HENT: Negative for ear discharge, ear pain, hoarse voice and sore throat.   Eyes: Negative for blurred vision.  Respiratory: Negative for cough, sputum production, shortness of breath and wheezing.   Cardiovascular: Negative for chest pain, palpitations, orthopnea, leg swelling and PND.  Gastrointestinal: Negative for abdominal pain, blood in stool, constipation, diarrhea, heartburn, melena and nausea.  Genitourinary: Negative for dysuria, frequency, hematuria and urgency.  Musculoskeletal: Positive for myalgias. Negative for back pain, joint pain and neck pain.  Skin: Negative for rash.  Neurological: Negative for dizziness, tingling, sensory change, focal weakness and headaches.  Endo/Heme/Allergies: Negative for environmental allergies, heat intolerance and polydipsia. Does not bruise/bleed easily.  Psychiatric/Behavioral: Positive for depression. Negative for decreased concentration and suicidal ideas. The patient is not nervous/anxious and does not have insomnia.       Objective  Vitals:   05/03/18 1132  BP: 124/60  Pulse: 72  Weight: 202 lb (91.6 kg)  Height: 5\' 4"  (9.604 m)    Physical Exam  Constitutional: She is not irritable. No distress.  HENT:  Head: Normocephalic and atraumatic.  Right Ear: External ear normal.  Left Ear: External ear normal.  Nose: Nose normal.  Mouth/Throat: Oropharynx is clear and moist.  Eyes: Pupils are equal, round, and reactive to light. Conjunctivae and EOM are normal. Right eye exhibits no discharge. Left eye exhibits no discharge.  Neck: Normal range of motion. Neck supple. No JVD present. No thyromegaly present.  Cardiovascular: Normal  rate, regular rhythm, normal heart sounds and intact distal pulses. Exam reveals no gallop and no friction rub.  No murmur heard. Pulmonary/Chest: Effort normal and breath sounds normal.  Abdominal: Soft. Bowel sounds are normal. She exhibits no mass. There is no tenderness. There is no guarding.  Musculoskeletal: Normal range of motion. She exhibits no edema.  Lymphadenopathy:    She has no cervical adenopathy.  Neurological: She is alert. She has normal reflexes.  Skin: Skin is warm and dry. She is not diaphoretic.      Assessment & Plan  Problem List Items Addressed This Visit      Cardiovascular and Mediastinum   Essential (primary) hypertension - Primary    Stable on meds- continue losartan and metoprolol/ draw renal panel      Relevant Medications   gemfibrozil (LOPID) 600 MG tablet   losartan (COZAAR) 50 MG tablet   metoprolol succinate (TOPROL-XL) 50 MG 24 hr tablet   simvastatin (ZOCOR) 20 MG tablet   Other Relevant Orders   Renal Function Panel     Respiratory   Reactive airway disease, mild intermittent, uncomplicated    Stable on montelukast- refill med      Relevant Medications   montelukast (SINGULAIR) 10 MG tablet   Chronic seasonal allergic rhinitis due to pollen    Controlled on med- continue as prescribed      Relevant  Medications   montelukast (SINGULAIR) 10 MG tablet     Endocrine   Hypothyroid    Stable on med- draw thyroid level      Relevant Medications   levothyroxine (SYNTHROID, LEVOTHROID) 112 MCG tablet   metoprolol succinate (TOPROL-XL) 50 MG 24 hr tablet   Other Relevant Orders   Thyroid Panel With TSH     Other   Mixed hyperlipidemia   Relevant Medications   gemfibrozil (LOPID) 600 MG tablet   losartan (COZAAR) 50 MG tablet   metoprolol succinate (TOPROL-XL) 50 MG 24 hr tablet   simvastatin (ZOCOR) 20 MG tablet   Other Relevant Orders   Lipid Panel With LDL/HDL Ratio   Familial multiple lipoprotein-type hyperlipidemia    Stable on meds- continue simvastatin and gemfibrozil/ draw lipid and hepatic      Relevant Medications   gemfibrozil (LOPID) 600 MG tablet   losartan (COZAAR) 50 MG tablet   metoprolol succinate (TOPROL-XL) 50 MG 24 hr tablet   simvastatin (ZOCOR) 20 MG tablet   Depression    Stable on citalopram- continue same dosing      Relevant Medications   citalopram (CELEXA) 40 MG tablet    Other Visit Diagnoses    Taking medication for chronic disease       Relevant Orders   Hepatic function panel   Flu vaccine need       administered   Relevant Orders   Flu vaccine HIGH DOSE PF (Completed)      Meds ordered this encounter  Medications  . citalopram (CELEXA) 40 MG tablet    Sig: Take 1 tablet (40 mg total) by mouth daily.    Dispense:  30 tablet    Refill:  5  . gemfibrozil (LOPID) 600 MG tablet    Sig: Take 1 tablet (600 mg total) by mouth daily.    Dispense:  30 tablet    Refill:  5  . levothyroxine (SYNTHROID, LEVOTHROID) 112 MCG tablet    Sig: Take 1 tablet daily    Dispense:  30 tablet    Refill:  5  . loratadine (  CLARITIN) 10 MG tablet    Sig: Take 1 tablet (10 mg total) by mouth daily.    Dispense:  30 tablet    Refill:  5  . losartan (COZAAR) 50 MG tablet    Sig: Take 1 tablet (50 mg total) by mouth daily.    Dispense:  30 tablet     Refill:  5  . metoprolol succinate (TOPROL-XL) 50 MG 24 hr tablet    Sig: TAKE 1 TABLET (50 MG TOTAL) BY MOUTH DAILY.    Dispense:  30 tablet    Refill:  5  . montelukast (SINGULAIR) 10 MG tablet    Sig: TAKE 1 TABLET BY MOUTH EVERYDAY AT BEDTIME    Dispense:  30 tablet    Refill:  5  . simvastatin (ZOCOR) 20 MG tablet    Sig: Take 1 tablet (20 mg total) by mouth daily.    Dispense:  30 tablet    Refill:  5    Come in for med refill      Dr. Otilio Miu [facility]  05/03/18

## 2018-05-03 NOTE — Assessment & Plan Note (Signed)
Controlled on med- continue as prescribed

## 2018-05-03 NOTE — Assessment & Plan Note (Signed)
Stable on citalopram- continue same dosing

## 2018-05-03 NOTE — Assessment & Plan Note (Signed)
Stable on montelukast- refill med

## 2018-05-03 NOTE — Assessment & Plan Note (Signed)
Stable on meds- continue simvastatin and gemfibrozil/ draw lipid and hepatic

## 2018-05-03 NOTE — Assessment & Plan Note (Signed)
Stable on med- draw thyroid level

## 2018-05-04 LAB — RENAL FUNCTION PANEL
Albumin: 4.4 g/dL (ref 3.6–4.8)
BUN/Creatinine Ratio: 23 (ref 12–28)
BUN: 20 mg/dL (ref 8–27)
CALCIUM: 9.4 mg/dL (ref 8.7–10.3)
CHLORIDE: 102 mmol/L (ref 96–106)
CO2: 21 mmol/L (ref 20–29)
Creatinine, Ser: 0.86 mg/dL (ref 0.57–1.00)
GFR calc Af Amer: 80 mL/min/{1.73_m2} (ref >59)
GFR calc non Af Amer: 69 mL/min/{1.73_m2} (ref >59)
GLUCOSE: 91 mg/dL (ref 65–99)
PHOSPHORUS: 2.7 mg/dL (ref 2.5–4.5)
Potassium: 5.1 mmol/L (ref 3.5–5.2)
SODIUM: 140 mmol/L (ref 134–144)

## 2018-05-04 LAB — LIPID PANEL WITH LDL/HDL RATIO
CHOLESTEROL TOTAL: 216 mg/dL — AB (ref 100–199)
HDL: 68 mg/dL (ref >39)
LDL Calculated: 106 mg/dL — ABNORMAL HIGH (ref 0–99)
LDl/HDL Ratio: 1.6 ratio (ref 0.0–3.2)
TRIGLYCERIDES: 210 mg/dL — AB (ref 0–149)
VLDL Cholesterol Cal: 42 mg/dL — ABNORMAL HIGH (ref 5–40)

## 2018-05-04 LAB — HEPATIC FUNCTION PANEL
ALK PHOS: 85 IU/L (ref 39–117)
ALT: 29 IU/L (ref 0–32)
AST: 31 IU/L (ref 0–40)
BILIRUBIN, DIRECT: 0.24 mg/dL (ref 0.00–0.40)
Bilirubin Total: 0.9 mg/dL (ref 0.0–1.2)
Total Protein: 6.8 g/dL (ref 6.0–8.5)

## 2018-05-04 LAB — THYROID PANEL WITH TSH
FREE THYROXINE INDEX: 2.3 (ref 1.2–4.9)
T3 Uptake Ratio: 30 % (ref 24–39)
T4, Total: 7.5 ug/dL (ref 4.5–12.0)
TSH: 5.59 u[IU]/mL — ABNORMAL HIGH (ref 0.450–4.500)

## 2018-05-05 ENCOUNTER — Other Ambulatory Visit: Payer: Self-pay

## 2018-05-05 MED ORDER — LEVOTHYROXINE SODIUM 125 MCG PO TABS: 125.0000 ug | ORAL_TABLET | Freq: Every day | ORAL | 1 refills | Status: DC

## 2018-05-12 ENCOUNTER — Other Ambulatory Visit: Payer: Self-pay

## 2018-05-12 DIAGNOSIS — M5441 Lumbago with sciatica, right side: Secondary | ICD-10-CM | POA: Diagnosis not present

## 2018-05-12 DIAGNOSIS — M5416 Radiculopathy, lumbar region: Secondary | ICD-10-CM | POA: Diagnosis not present

## 2018-05-12 DIAGNOSIS — G8929 Other chronic pain: Secondary | ICD-10-CM | POA: Diagnosis not present

## 2018-05-13 ENCOUNTER — Other Ambulatory Visit (HOSPITAL_COMMUNITY): Payer: Self-pay | Admitting: Student

## 2018-05-13 DIAGNOSIS — M545 Low back pain: Secondary | ICD-10-CM

## 2018-05-18 ENCOUNTER — Other Ambulatory Visit: Payer: Self-pay

## 2018-05-18 MED ORDER — AZITHROMYCIN 250 MG PO TABS: ORAL_TABLET | ORAL | 0 refills | Status: DC

## 2018-05-26 ENCOUNTER — Ambulatory Visit (HOSPITAL_COMMUNITY): Admission: RE | Admit: 2018-05-26 | Payer: Medicare Other | Source: Ambulatory Visit

## 2018-06-02 ENCOUNTER — Ambulatory Visit (HOSPITAL_COMMUNITY)
Admission: RE | Admit: 2018-06-02 | Discharge: 2018-06-02 | Disposition: A | Discharge status: Home / Self Care | Payer: Medicare Other | Source: Ambulatory Visit | Attending: Student | Admitting: Student

## 2018-06-02 DIAGNOSIS — M545 Low back pain, unspecified: Secondary | ICD-10-CM

## 2018-06-02 DIAGNOSIS — M48061 Spinal stenosis, lumbar region without neurogenic claudication: Secondary | ICD-10-CM | POA: Insufficient documentation

## 2018-06-02 DIAGNOSIS — M4316 Spondylolisthesis, lumbar region: Secondary | ICD-10-CM | POA: Diagnosis not present

## 2018-06-09 DIAGNOSIS — Z7901 Long term (current) use of anticoagulants: Secondary | ICD-10-CM | POA: Diagnosis not present

## 2018-06-09 DIAGNOSIS — Z45018 Encounter for adjustment and management of other part of cardiac pacemaker: Secondary | ICD-10-CM | POA: Diagnosis not present

## 2018-06-09 DIAGNOSIS — Z79899 Other long term (current) drug therapy: Secondary | ICD-10-CM | POA: Diagnosis not present

## 2018-06-09 DIAGNOSIS — M48061 Spinal stenosis, lumbar region without neurogenic claudication: Secondary | ICD-10-CM | POA: Diagnosis not present

## 2018-06-09 DIAGNOSIS — I480 Paroxysmal atrial fibrillation: Secondary | ICD-10-CM | POA: Diagnosis not present

## 2018-06-09 DIAGNOSIS — M4316 Spondylolisthesis, lumbar region: Secondary | ICD-10-CM | POA: Diagnosis not present

## 2018-06-09 DIAGNOSIS — I10 Essential (primary) hypertension: Secondary | ICD-10-CM | POA: Diagnosis not present

## 2018-06-09 DIAGNOSIS — Z950 Presence of cardiac pacemaker: Secondary | ICD-10-CM | POA: Diagnosis not present

## 2018-06-09 DIAGNOSIS — I442 Atrioventricular block, complete: Secondary | ICD-10-CM | POA: Diagnosis not present

## 2018-06-09 DIAGNOSIS — Z87891 Personal history of nicotine dependence: Secondary | ICD-10-CM | POA: Diagnosis not present

## 2018-06-14 ENCOUNTER — Ambulatory Visit (INDEPENDENT_AMBULATORY_CARE_PROVIDER_SITE_OTHER): Payer: Medicare Other | Admitting: Family Medicine

## 2018-06-14 ENCOUNTER — Encounter: Payer: Self-pay | Admitting: Family Medicine

## 2018-06-14 VITALS — BP 122/88 | HR 72 | Ht 64.0 in | Wt 202.0 lb

## 2018-06-14 DIAGNOSIS — Z01818 Encounter for other preprocedural examination: Secondary | ICD-10-CM

## 2018-06-14 DIAGNOSIS — Z950 Presence of cardiac pacemaker: Secondary | ICD-10-CM

## 2018-06-14 DIAGNOSIS — I10 Essential (primary) hypertension: Secondary | ICD-10-CM | POA: Diagnosis not present

## 2018-06-14 NOTE — Progress Notes (Signed)
Date:  06/14/2018   Name:  Danielle Taylor   DOB:  [birth year]   MRN:  [id number]   Chief Complaint: preop clearance (needs to be off Xarelto 3 days prior to L4 fusion surgery on 06/29/18. Will restart 2 weeks after surgery. Also getting in touch with Dr Allena Earing at [facility]) Patient is a 72 year old female who presents for a comprehensive physical exam. The patient reports the following problems: none.Marland Kitchen Health maintenance has been reviewed up to date.    Review of Systems  Constitutional: Negative.  Negative for chills, fatigue, fever and unexpected weight change.  HENT: Negative for congestion, ear discharge, ear pain, rhinorrhea, sinus pressure, sneezing and sore throat.   Eyes: Negative for photophobia, pain, discharge, redness and itching.  Respiratory: Negative for cough, shortness of breath, wheezing and stridor.   Gastrointestinal: Negative for abdominal pain, blood in stool, constipation, diarrhea, nausea and vomiting.  Endocrine: Negative for cold intolerance, heat intolerance, polydipsia, polyphagia and polyuria.  Genitourinary: Negative for dysuria, flank pain, frequency, hematuria, menstrual problem, pelvic pain, urgency, vaginal bleeding and vaginal discharge.  Musculoskeletal: Negative for arthralgias, back pain and myalgias.  Skin: Negative for rash.  Allergic/Immunologic: Negative for environmental allergies and food allergies.  Neurological: Negative for dizziness, weakness, light-headedness, numbness and headaches.  Hematological: Negative for adenopathy. Does not bruise/bleed easily.  Psychiatric/Behavioral: Negative for dysphoric mood. The patient is not nervous/anxious.     Patient Active Problem List   Diagnosis Date Noted  . Reactive airway disease, mild intermittent, uncomplicated 78/[id number]/6720  . Chronic seasonal allergic rhinitis due to pollen 08/10/2017  . Mixed hyperlipidemia 08/10/2017  . Depression 08/10/2017  . Class 1 obesity due to excess calories  without serious comorbidity with body mass index (BMI) of 34.0 to 34.9 in adult 08/10/2017  . Centrilobular emphysema ([place]) 04/02/2017  . Lumbar disc disease 04/02/2017  . Hepatic steatosis 04/02/2017  . Atherosclerosis of coronary artery of native heart 04/02/2017  . Syncope and collapse 03/11/2016  . Right leg weakness 03/11/2016  . Essential hypertension 03/11/2016  . Hyperglycemia 03/11/2016  . Reactive airway disease 03/04/2015  . Hypothyroid 12/07/2014  . Familial multiple lipoprotein-type hyperlipidemia 12/07/2014  . Acute bronchitis 12/07/2014  . Recurrent major depressive episodes ([place]) 12/07/2014  . Essential (primary) hypertension 12/07/2014  . H/O endocrine disorder 12/07/2014  . Pre-operative examination 12/07/2014    Allergies  Allergen Reactions  . Penicillins Other (See Comments)    Has patient had a PCN reaction causing immediate rash, facial/tongue/throat swelling, SOB or lightheadedness with hypotension: No Has patient had a PCN reaction causing severe rash involving mucus membranes or skin necrosis: No Has patient had a PCN reaction that required hospitalization No Has patient had a PCN reaction occurring within the last 10 years: No If all of the above answers are "NO", then may proceed with Cephalosporin use.     Past Surgical History:  Procedure Laterality Date  . APPENDECTOMY    . COLONOSCOPY  2000  . COLONOSCOPY WITH PROPOFOL N/A 04/26/2015   Procedure: COLONOSCOPY WITH PROPOFOL;  Surgeon: Hulen Luster, MD;  Location: [facility] ENDOSCOPY;  Service: Gastroenterology;  Laterality: N/A;  . fibroid tumors     fingers and wrist  . FOOT SURGERY    . TONSILLECTOMY    . VAGINAL HYSTERECTOMY      Social History   Tobacco Use  . Smoking status: Former Smoker    Packs/day: 0.50    Years: 33.00    Pack years: 16.50  Types: Cigarettes    Last attempt to quit: 2003    Years since quitting: 16.8  . Smokeless tobacco: Never Used  . Tobacco comment: smoking  cessation materials not required  Substance Use Topics  . Alcohol use: Not Currently    Alcohol/week: 0.0 standard drinks  . Drug use: No     Medication list has been reviewed and updated.  Current Meds  Medication Sig  . albuterol (PROVENTIL HFA;VENTOLIN HFA) 108 (90 Base) MCG/ACT inhaler Inhale 1-2 puffs into the lungs every 6 (six) hours as needed for wheezing or shortness of breath.  Marland Kitchen albuterol (PROVENTIL) (2.5 MG/3ML) 0.083% nebulizer solution Take 3 mLs (2.5 mg total) by nebulization every 6 (six) hours as needed for wheezing or shortness of breath.  . citalopram (CELEXA) 40 MG tablet Take 1 tablet (40 mg total) by mouth daily.  Marland Kitchen gemfibrozil (LOPID) 600 MG tablet Take 1 tablet (600 mg total) by mouth daily.  Marland Kitchen ipratropium-albuterol (DUONEB) 0.5-2.5 (3) MG/3ML SOLN Take 3 mLs by nebulization every 6 (six) hours as needed.  Marland Kitchen levothyroxine (SYNTHROID, LEVOTHROID) 125 MCG tablet Take 1 tablet (125 mcg total) by mouth daily.  Marland Kitchen loratadine (CLARITIN) 10 MG tablet Take 1 tablet (10 mg total) by mouth daily.  Marland Kitchen losartan (COZAAR) 50 MG tablet Take 1 tablet (50 mg total) by mouth daily.  . metoprolol succinate (TOPROL-XL) 50 MG 24 hr tablet TAKE 1 TABLET (50 MG TOTAL) BY MOUTH DAILY.  . montelukast (SINGULAIR) 10 MG tablet TAKE 1 TABLET BY MOUTH EVERYDAY AT BEDTIME  . Multiple Vitamin (MULTI-VITAMINS) TABS Take by mouth.  . nystatin cream (MYCOSTATIN) Apply 1 application topically 2 (two) times daily.  . rivaroxaban (XARELTO) 20 MG TABS tablet Take 1 tablet by mouth daily. Dr Allena Earing at [facility]  . simvastatin (ZOCOR) 20 MG tablet Take 1 tablet (20 mg total) by mouth daily.  Marland Kitchen umeclidinium-vilanterol (ANORO ELLIPTA) 62.5-25 MCG/INH AEPB Inhale into the lungs.   Current Facility-Administered Medications for the 06/14/18 encounter (Office Visit) with Juline Patch, MD  Medication  . albuterol (PROVENTIL) (2.5 MG/3ML) 0.083% nebulizer solution 2.5 mg  . ipratropium-albuterol (DUONEB)  0.5-2.5 (3) MG/3ML nebulizer solution 3 mL    PHQ 2/9 Scores 05/03/2018 11/15/2017 10/29/2017 02/08/2017  PHQ - 2 Score 0 0 0 0  PHQ- 9 Score 0 0 0 -    Physical Exam  Constitutional: She is oriented to person, place, and time. She appears well-developed and well-nourished.  HENT:  Head: Normocephalic.  Right Ear: External ear normal.  Left Ear: External ear normal.  Mouth/Throat: Oropharynx is clear and moist.  Eyes: Pupils are equal, round, and reactive to light. Conjunctivae and EOM are normal. Lids are everted and swept, no foreign bodies found. Left eye exhibits no hordeolum. No foreign body present in the left eye. Right conjunctiva is not injected. Left conjunctiva is not injected. No scleral icterus.  Neck: Normal range of motion. Neck supple. No JVD present. No tracheal deviation present. No thyromegaly present.  Cardiovascular: Normal rate, regular rhythm, normal heart sounds and intact distal pulses. Exam reveals no gallop and no friction rub.  No murmur heard. Pulmonary/Chest: Effort normal and breath sounds normal. No respiratory distress. She has no wheezes. She has no rales.  Abdominal: Soft. Bowel sounds are normal. She exhibits no mass. There is no hepatosplenomegaly. There is no tenderness. There is no rebound and no guarding.  Musculoskeletal: Normal range of motion. She exhibits no edema or tenderness.  Lymphadenopathy:    She has  no cervical adenopathy.  Neurological: She is alert and oriented to person, place, and time. She has normal strength. She displays normal reflexes. No cranial nerve deficit.  Skin: Skin is warm. No rash noted.  Psychiatric: She has a normal mood and affect. Her mood appears not anxious. She does not exhibit a depressed mood.  Nursing note and vitals reviewed.   BP 122/88   Pulse 72   Ht 5\' 4"  (1.626 m)   Wt 202 lb (91.6 kg)   BMI 34.67 kg/m   Assessment and Plan:  1. Preoperative clearance Preop L4-5 fusion. No contraindication. Hold  XARELto 3 days prior to surgery.  2. Essential hypertension Chronic Controlled. Continue losatan/ metoprolol at present dosing  3. Presence of permanent cardiac pacemaker For hx of complete heart block. Followed by [facility]   Dr. [facility]  06/14/2018

## 2018-06-20 DIAGNOSIS — E669 Obesity, unspecified: Secondary | ICD-10-CM | POA: Diagnosis not present

## 2018-06-20 DIAGNOSIS — J42 Unspecified chronic bronchitis: Secondary | ICD-10-CM | POA: Diagnosis not present

## 2018-06-20 DIAGNOSIS — I480 Paroxysmal atrial fibrillation: Secondary | ICD-10-CM | POA: Diagnosis not present

## 2018-06-20 DIAGNOSIS — I442 Atrioventricular block, complete: Secondary | ICD-10-CM | POA: Diagnosis not present

## 2018-06-20 DIAGNOSIS — Z7901 Long term (current) use of anticoagulants: Secondary | ICD-10-CM | POA: Diagnosis not present

## 2018-06-20 DIAGNOSIS — Z01818 Encounter for other preprocedural examination: Secondary | ICD-10-CM | POA: Diagnosis not present

## 2018-06-20 DIAGNOSIS — Z950 Presence of cardiac pacemaker: Secondary | ICD-10-CM | POA: Diagnosis not present

## 2018-06-20 DIAGNOSIS — I498 Other specified cardiac arrhythmias: Secondary | ICD-10-CM | POA: Diagnosis not present

## 2018-06-20 DIAGNOSIS — I10 Essential (primary) hypertension: Secondary | ICD-10-CM | POA: Diagnosis not present

## 2018-06-20 DIAGNOSIS — R55 Syncope and collapse: Secondary | ICD-10-CM | POA: Diagnosis not present

## 2018-06-20 DIAGNOSIS — F339 Major depressive disorder, recurrent, unspecified: Secondary | ICD-10-CM | POA: Diagnosis not present

## 2018-06-23 ENCOUNTER — Encounter
Admission: RE | Admit: 2018-06-23 | Discharge: 2018-06-23 | Disposition: A | Discharge status: Home / Self Care | Payer: Medicare Other | Source: Ambulatory Visit | Attending: Neurosurgery | Admitting: Neurosurgery

## 2018-06-23 ENCOUNTER — Other Ambulatory Visit: Payer: Self-pay

## 2018-06-23 DIAGNOSIS — M4310 Spondylolisthesis, site unspecified: Secondary | ICD-10-CM | POA: Insufficient documentation

## 2018-06-23 DIAGNOSIS — Z880 Allergy status to penicillin: Secondary | ICD-10-CM | POA: Insufficient documentation

## 2018-06-23 DIAGNOSIS — Z950 Presence of cardiac pacemaker: Secondary | ICD-10-CM | POA: Diagnosis not present

## 2018-06-23 DIAGNOSIS — Z01818 Encounter for other preprocedural examination: Secondary | ICD-10-CM | POA: Insufficient documentation

## 2018-06-23 DIAGNOSIS — Z01810 Encounter for preprocedural cardiovascular examination: Secondary | ICD-10-CM | POA: Diagnosis not present

## 2018-06-23 LAB — CBC
HCT: 42.2 % (ref 36.0–46.0)
Hemoglobin: 14.1 g/dL (ref 12.0–15.0)
MCH: 33.4 pg (ref 26.0–34.0)
MCHC: 33.4 g/dL (ref 30.0–36.0)
MCV: 100 fL (ref 80.0–100.0)
PLATELETS: 293 10*3/uL (ref 150–400)
RBC: 4.22 MIL/uL (ref 3.87–5.11)
RDW: 12.7 % (ref 11.5–15.5)
WBC: 7.1 10*3/uL (ref 4.0–10.5)
nRBC: 0 % (ref 0.0–0.2)

## 2018-06-23 LAB — URINALYSIS, ROUTINE W REFLEX MICROSCOPIC
BACTERIA UA: NONE SEEN
Bilirubin Urine: NEGATIVE
Glucose, UA: NEGATIVE mg/dL
Ketones, ur: NEGATIVE mg/dL
Leukocytes, UA: NEGATIVE
NITRITE: NEGATIVE
PROTEIN: NEGATIVE mg/dL
SPECIFIC GRAVITY, URINE: 1.005 (ref 1.005–1.030)
SQUAMOUS EPITHELIAL / LPF: NONE SEEN (ref 0–5)
pH: 6 (ref 5.0–8.0)

## 2018-06-23 LAB — BASIC METABOLIC PANEL
ANION GAP: 12 (ref 5–15)
BUN: 26 mg/dL — AB (ref 8–23)
CO2: 22 mmol/L (ref 22–32)
Calcium: 9.5 mg/dL (ref 8.9–10.3)
Chloride: 105 mmol/L (ref 98–111)
Creatinine, Ser: 0.74 mg/dL (ref 0.44–1.00)
GFR calc Af Amer: >60 mL/min (ref >60)
GFR calc non Af Amer: >60 mL/min (ref >60)
GLUCOSE: 90 mg/dL (ref 70–99)
POTASSIUM: 4.2 mmol/L (ref 3.5–5.1)
Sodium: 139 mmol/L (ref 135–145)

## 2018-06-23 LAB — TYPE AND SCREEN
ABO/RH(D): O POS
Antibody Screen: NEGATIVE

## 2018-06-23 LAB — PROTIME-INR
INR: 1.51
PROTHROMBIN TIME: 18 s — AB (ref 11.4–15.2)

## 2018-06-23 LAB — APTT: aPTT: 40 seconds — ABNORMAL HIGH (ref 24–36)

## 2018-06-23 LAB — SURGICAL PCR SCREEN
MRSA, PCR: NEGATIVE
STAPHYLOCOCCUS AUREUS: NEGATIVE

## 2018-06-23 NOTE — Patient Instructions (Signed)
Your procedure is scheduled on: wed. 11/6 Report to Day Surgery. To find out your arrival time please call [phone] between 1PM - 3PM on Tues 11/5.  Remember: Instructions that are not followed completely may result in serious medical risk,  up to and including death, or upon the discretion of your surgeon and anesthesiologist your  surgery may need to be rescheduled.     _X__ 1. Do not eat food after midnight the night before your procedure.                 No gum chewing or hard candies. You may drink clear liquids up to 2 hours                 before you are scheduled to arrive for your surgery- DO not drink clear                 liquids within 2 hours of the start of your surgery.                 Clear Liquids include:  water, apple juice without pulp, clear carbohydrate                 drink such as Clearfast of Gatorade, Black Coffee or Tea (Do not add                 anything to coffee or tea).  __X__2.  On the morning of surgery brush your teeth with toothpaste and water, you                may rinse your mouth with mouthwash if you wish.  Do not swallow any toothpaste of mouthwash.     _X__ 3.  No Alcohol for 24 hours before or after surgery.   ___ 4.  Do Not Smoke or use e-cigarettes For 24 Hours Prior to Your Surgery.                 Do not use any chewable tobacco products for at least 6 hours prior to                 surgery.  ____  5.  Bring all medications with you on the day of surgery if instructed.   _x___  6.  Notify your doctor if there is any change in your medical condition      (cold, fever, infections).     Do not wear jewelry, make-up, hairpins, clips or nail polish. Do not wear lotions, powders, or perfumes. You may wear deodorant. Do not shave 48 hours prior to surgery. Men may shave face and neck. Do not bring valuables to the hospital.    [facility] is not responsible for any belongings or valuables.  Contacts, dentures  or bridgework may not be worn into surgery. Leave your suitcase in the car. After surgery it may be brought to your room. For patients admitted to the hospital, discharge time is determined by your treatment team.   Patients discharged the day of surgery will not be allowed to drive home.   Please read over the following fact sheets that you were given:    __x__ Take these medicines the morning of surgery with A SIP OF WATER:    1. acetaminophen (TYLENOL) 500 MG tablet if needed  2. citalopram (CELEXA) 40 MG tablet  3. gemfibrozil (LOPID) 600 MG tablet  4.levothyroxine (SYNTHROID, LEVOTHROID) 125 MCG tablet  5.loratadine (CLARITIN) 10 MG tablet  6.metoprolol succinate (TOPROL-XL) 50 MG 24 hr tablet             7.simvastatin (ZOCOR) 20 MG tablet  ____ Fleet Enema (as directed)   _x___ Use CHG Soap as directed  __x__ Use inhalers on the day of surgery umeclidinium-vilanterol (ANORO ELLIPTA) 62.5-25 MCG/INH AEPB  ____ Stop metformin 2 days prior to surgery    ____ Take 1/2 of usual insulin dose the night before surgery. No insulin the morning          of surgery.   __x__ Stop  rivaroxaban (XARELTO) 20 MG TABS tablet on 11/2 Saturday  ____ Stop Anti-inflammatories on    ____ Stop supplements until after surgery.    ____ Bring C-Pap to the hospital.

## 2018-06-24 ENCOUNTER — Other Ambulatory Visit: Payer: Self-pay | Admitting: Family Medicine

## 2018-06-29 ENCOUNTER — Inpatient Hospital Stay
Admission: RE | Admit: 2018-06-29 | Discharge: 2018-07-07 | DRG: 454 | Disposition: A | Discharge status: Rehabilitation Facility | Payer: Medicare Other | Attending: Neurosurgery | Admitting: Neurosurgery

## 2018-06-29 ENCOUNTER — Inpatient Hospital Stay: Payer: Medicare Other

## 2018-06-29 ENCOUNTER — Other Ambulatory Visit: Payer: Self-pay

## 2018-06-29 ENCOUNTER — Encounter: Payer: Self-pay | Admitting: *Deleted

## 2018-06-29 ENCOUNTER — Encounter
Admission: RE | Disposition: A | Discharge status: Rehabilitation Facility | Payer: Self-pay | Source: Home / Self Care | Attending: Neurosurgery

## 2018-06-29 DIAGNOSIS — Z79890 Hormone replacement therapy (postmenopausal): Secondary | ICD-10-CM

## 2018-06-29 DIAGNOSIS — K2950 Unspecified chronic gastritis without bleeding: Secondary | ICD-10-CM | POA: Diagnosis present

## 2018-06-29 DIAGNOSIS — M4316 Spondylolisthesis, lumbar region: Secondary | ICD-10-CM | POA: Diagnosis present

## 2018-06-29 DIAGNOSIS — M5126 Other intervertebral disc displacement, lumbar region: Secondary | ICD-10-CM | POA: Diagnosis not present

## 2018-06-29 DIAGNOSIS — Z7901 Long term (current) use of anticoagulants: Secondary | ICD-10-CM

## 2018-06-29 DIAGNOSIS — I2510 Atherosclerotic heart disease of native coronary artery without angina pectoris: Secondary | ICD-10-CM | POA: Diagnosis present

## 2018-06-29 DIAGNOSIS — I480 Paroxysmal atrial fibrillation: Secondary | ICD-10-CM | POA: Diagnosis present

## 2018-06-29 DIAGNOSIS — E039 Hypothyroidism, unspecified: Secondary | ICD-10-CM | POA: Diagnosis present

## 2018-06-29 DIAGNOSIS — Z833 Family history of diabetes mellitus: Secondary | ICD-10-CM | POA: Diagnosis not present

## 2018-06-29 DIAGNOSIS — Z981 Arthrodesis status: Secondary | ICD-10-CM

## 2018-06-29 DIAGNOSIS — M4326 Fusion of spine, lumbar region: Secondary | ICD-10-CM | POA: Diagnosis not present

## 2018-06-29 DIAGNOSIS — K219 Gastro-esophageal reflux disease without esophagitis: Secondary | ICD-10-CM | POA: Diagnosis present

## 2018-06-29 DIAGNOSIS — J449 Chronic obstructive pulmonary disease, unspecified: Secondary | ICD-10-CM | POA: Diagnosis present

## 2018-06-29 DIAGNOSIS — Z87891 Personal history of nicotine dependence: Secondary | ICD-10-CM | POA: Diagnosis not present

## 2018-06-29 DIAGNOSIS — Z880 Allergy status to penicillin: Secondary | ICD-10-CM

## 2018-06-29 DIAGNOSIS — J4520 Mild intermittent asthma, uncomplicated: Secondary | ICD-10-CM

## 2018-06-29 DIAGNOSIS — J432 Centrilobular emphysema: Secondary | ICD-10-CM

## 2018-06-29 DIAGNOSIS — R251 Tremor, unspecified: Secondary | ICD-10-CM | POA: Diagnosis not present

## 2018-06-29 DIAGNOSIS — G629 Polyneuropathy, unspecified: Secondary | ICD-10-CM | POA: Diagnosis present

## 2018-06-29 DIAGNOSIS — Z8249 Family history of ischemic heart disease and other diseases of the circulatory system: Secondary | ICD-10-CM | POA: Diagnosis not present

## 2018-06-29 DIAGNOSIS — Z950 Presence of cardiac pacemaker: Secondary | ICD-10-CM | POA: Diagnosis not present

## 2018-06-29 DIAGNOSIS — F329 Major depressive disorder, single episode, unspecified: Secondary | ICD-10-CM | POA: Diagnosis present

## 2018-06-29 DIAGNOSIS — G9009 Other idiopathic peripheral autonomic neuropathy: Secondary | ICD-10-CM | POA: Diagnosis not present

## 2018-06-29 DIAGNOSIS — E782 Mixed hyperlipidemia: Secondary | ICD-10-CM | POA: Diagnosis not present

## 2018-06-29 DIAGNOSIS — M5416 Radiculopathy, lumbar region: Principal | ICD-10-CM | POA: Diagnosis present

## 2018-06-29 DIAGNOSIS — I442 Atrioventricular block, complete: Secondary | ICD-10-CM | POA: Diagnosis present

## 2018-06-29 DIAGNOSIS — Z8262 Family history of osteoporosis: Secondary | ICD-10-CM

## 2018-06-29 DIAGNOSIS — Z90710 Acquired absence of both cervix and uterus: Secondary | ICD-10-CM | POA: Diagnosis not present

## 2018-06-29 DIAGNOSIS — Z79899 Other long term (current) drug therapy: Secondary | ICD-10-CM

## 2018-06-29 DIAGNOSIS — I10 Essential (primary) hypertension: Secondary | ICD-10-CM | POA: Diagnosis present

## 2018-06-29 DIAGNOSIS — E785 Hyperlipidemia, unspecified: Secondary | ICD-10-CM | POA: Diagnosis present

## 2018-06-29 DIAGNOSIS — M5136 Other intervertebral disc degeneration, lumbar region: Secondary | ICD-10-CM | POA: Diagnosis not present

## 2018-06-29 DIAGNOSIS — Z419 Encounter for procedure for purposes other than remedying health state, unspecified: Secondary | ICD-10-CM

## 2018-06-29 HISTORY — PX: TRANSFORAMINAL LUMBAR INTERBODY FUSION (TLIF) WITH PEDICLE SCREW FIXATION 1 LEVEL: SHX6141

## 2018-06-29 LAB — ABO/RH: ABO/RH(D): O POS

## 2018-06-29 SURGERY — TRANSFORAMINAL LUMBAR INTERBODY FUSION (TLIF) WITH PEDICLE SCREW FIXATION 1 LEVEL
Anesthesia: General | Site: Back

## 2018-06-29 MED ORDER — UMECLIDINIUM-VILANTEROL 62.5-25 MCG/INH IN AEPB
1.0000 | INHALATION_SPRAY | Freq: Every day | RESPIRATORY_TRACT | Status: DC
  Administered 2018-06-30 – 2018-07-06 (×7): 1 via RESPIRATORY_TRACT
  Filled 2018-06-29 (×2): qty 14

## 2018-06-29 MED ORDER — ONDANSETRON HCL 4 MG PO TABS: 4.0000 mg | ORAL_TABLET | Freq: Four times a day (QID) | ORAL | Status: DC | PRN

## 2018-06-29 MED ORDER — METHOCARBAMOL 500 MG PO TABS
500.0000 mg | ORAL_TABLET | Freq: Four times a day (QID) | ORAL | Status: DC | PRN
  Administered 2018-06-29 – 2018-07-07 (×5): 500 mg via ORAL
  Filled 2018-06-29 (×4): qty 1

## 2018-06-29 MED ORDER — ONDANSETRON HCL 4 MG/2ML IJ SOLN
INTRAMUSCULAR | Status: AC
  Filled 2018-06-29: qty 2

## 2018-06-29 MED ORDER — REMIFENTANIL HCL 1 MG IV SOLR
INTRAVENOUS | Status: AC
  Filled 2018-06-29: qty 1000

## 2018-06-29 MED ORDER — ROCURONIUM BROMIDE 100 MG/10ML IV SOLN
INTRAVENOUS | Status: DC | PRN
  Administered 2018-06-29: 5 mg via INTRAVENOUS

## 2018-06-29 MED ORDER — THROMBIN 5000 UNITS EX SOLR
CUTANEOUS | Status: DC | PRN
  Administered 2018-06-29: 5000 [IU] via TOPICAL

## 2018-06-29 MED ORDER — METOPROLOL SUCCINATE ER 50 MG PO TB24
50.0000 mg | ORAL_TABLET | Freq: Every day | ORAL | Status: DC
  Administered 2018-06-30 – 2018-07-07 (×7): 50 mg via ORAL
  Filled 2018-06-29 (×7): qty 1

## 2018-06-29 MED ORDER — LORATADINE 10 MG PO TABS
10.0000 mg | ORAL_TABLET | Freq: Every day | ORAL | Status: DC
  Administered 2018-06-30 – 2018-07-07 (×8): 10 mg via ORAL
  Filled 2018-06-29 (×8): qty 1

## 2018-06-29 MED ORDER — VANCOMYCIN HCL IN DEXTROSE 1-5 GM/200ML-% IV SOLN
1000.0000 mg | Freq: Once | INTRAVENOUS | Status: AC
  Administered 2018-06-29: 1000 mg via INTRAVENOUS

## 2018-06-29 MED ORDER — SUCCINYLCHOLINE CHLORIDE 20 MG/ML IJ SOLN
INTRAMUSCULAR | Status: DC | PRN
  Administered 2018-06-29: 100 mg via INTRAVENOUS

## 2018-06-29 MED ORDER — ACETAMINOPHEN 325 MG PO TABS
650.0000 mg | ORAL_TABLET | ORAL | Status: DC | PRN
  Administered 2018-06-30 – 2018-07-07 (×11): 650 mg via ORAL
  Filled 2018-06-29 (×11): qty 2

## 2018-06-29 MED ORDER — BUPIVACAINE-EPINEPHRINE (PF) 0.5% -1:200000 IJ SOLN
INTRAMUSCULAR | Status: DC | PRN
  Administered 2018-06-29: 10 mL

## 2018-06-29 MED ORDER — ONDANSETRON HCL 4 MG/2ML IJ SOLN
INTRAMUSCULAR | Status: DC | PRN
  Administered 2018-06-29: 4 mg via INTRAVENOUS

## 2018-06-29 MED ORDER — ACETAMINOPHEN 650 MG RE SUPP: 650.0000 mg | RECTAL | Status: DC | PRN

## 2018-06-29 MED ORDER — HYDROMORPHONE HCL 1 MG/ML IJ SOLN
INTRAMUSCULAR | Status: AC
  Administered 2018-06-29: 0.25 mg via INTRAVENOUS
  Filled 2018-06-29: qty 1

## 2018-06-29 MED ORDER — ACETAMINOPHEN 500 MG PO TABS
1000.0000 mg | ORAL_TABLET | Freq: Four times a day (QID) | ORAL | Status: AC
  Administered 2018-06-30 (×3): 1000 mg via ORAL
  Filled 2018-06-29 (×4): qty 2

## 2018-06-29 MED ORDER — SODIUM CHLORIDE 0.9 % IV SOLN
INTRAVENOUS | Status: DC | PRN
  Administered 2018-06-29: 50 ug/min via INTRAVENOUS

## 2018-06-29 MED ORDER — REMIFENTANIL HCL 1 MG IV SOLR
INTRAVENOUS | Status: DC | PRN
  Administered 2018-06-29: .15 ug/kg/min via INTRAVENOUS

## 2018-06-29 MED ORDER — PROPOFOL 500 MG/50ML IV EMUL
INTRAVENOUS | Status: AC
  Filled 2018-06-29: qty 50

## 2018-06-29 MED ORDER — HYDROMORPHONE HCL 1 MG/ML IJ SOLN
0.5000 mg | INTRAMUSCULAR | Status: DC | PRN
  Administered 2018-06-29: 0.5 mg via INTRAVENOUS
  Filled 2018-06-29: qty 1

## 2018-06-29 MED ORDER — LIDOCAINE HCL (CARDIAC) PF 100 MG/5ML IV SOSY
PREFILLED_SYRINGE | INTRAVENOUS | Status: DC | PRN
  Administered 2018-06-29: 80 mg via INTRAVENOUS

## 2018-06-29 MED ORDER — PROPOFOL 10 MG/ML IV BOLUS
INTRAVENOUS | Status: DC | PRN
  Administered 2018-06-29: 150 mg via INTRAVENOUS

## 2018-06-29 MED ORDER — SODIUM CHLORIDE 0.9% FLUSH: 3.0000 mL | INTRAVENOUS | Status: DC | PRN

## 2018-06-29 MED ORDER — LOSARTAN POTASSIUM 50 MG PO TABS
50.0000 mg | ORAL_TABLET | Freq: Every day | ORAL | Status: DC
  Administered 2018-06-30 – 2018-07-07 (×8): 50 mg via ORAL
  Filled 2018-06-29 (×8): qty 1

## 2018-06-29 MED ORDER — MIDAZOLAM HCL 2 MG/2ML IJ SOLN
INTRAMUSCULAR | Status: AC
  Filled 2018-06-29: qty 2

## 2018-06-29 MED ORDER — MENTHOL 3 MG MT LOZG
1.0000 | LOZENGE | OROMUCOSAL | Status: DC | PRN
  Filled 2018-06-29: qty 9

## 2018-06-29 MED ORDER — PROPOFOL 500 MG/50ML IV EMUL
INTRAVENOUS | Status: DC | PRN
  Administered 2018-06-29: 150 ug/kg/min via INTRAVENOUS

## 2018-06-29 MED ORDER — MIDAZOLAM HCL 2 MG/2ML IJ SOLN
INTRAMUSCULAR | Status: DC | PRN
  Administered 2018-06-29: 2 mg via INTRAVENOUS

## 2018-06-29 MED ORDER — LIDOCAINE HCL (PF) 2 % IJ SOLN
INTRAMUSCULAR | Status: AC
  Filled 2018-06-29: qty 10

## 2018-06-29 MED ORDER — CELECOXIB 100 MG PO CAPS
100.0000 mg | ORAL_CAPSULE | Freq: Two times a day (BID) | ORAL | Status: DC
  Administered 2018-06-29 – 2018-06-30 (×2): 100 mg via ORAL
  Filled 2018-06-29 (×3): qty 1

## 2018-06-29 MED ORDER — SODIUM CHLORIDE FLUSH 0.9 % IV SOLN
INTRAVENOUS | Status: AC
  Filled 2018-06-29: qty 10

## 2018-06-29 MED ORDER — PHENOL 1.4 % MT LIQD
1.0000 | OROMUCOSAL | Status: DC | PRN
  Filled 2018-06-29: qty 177

## 2018-06-29 MED ORDER — ALBUTEROL SULFATE (2.5 MG/3ML) 0.083% IN NEBU: 2.5000 mg | INHALATION_SOLUTION | Freq: Once | RESPIRATORY_TRACT | Status: DC

## 2018-06-29 MED ORDER — GLYCOPYRROLATE 0.2 MG/ML IJ SOLN
INTRAMUSCULAR | Status: AC
  Filled 2018-06-29: qty 1

## 2018-06-29 MED ORDER — ONDANSETRON HCL 4 MG/2ML IJ SOLN: 4.0000 mg | Freq: Four times a day (QID) | INTRAMUSCULAR | Status: DC | PRN

## 2018-06-29 MED ORDER — GLYCOPYRROLATE 0.2 MG/ML IJ SOLN
INTRAMUSCULAR | Status: DC | PRN
  Administered 2018-06-29: 0.2 mg via INTRAVENOUS

## 2018-06-29 MED ORDER — DEXAMETHASONE SODIUM PHOSPHATE 10 MG/ML IJ SOLN
INTRAMUSCULAR | Status: AC
  Filled 2018-06-29: qty 1

## 2018-06-29 MED ORDER — INSULIN ASPART 100 UNIT/ML ~~LOC~~ SOLN: 0.0000 [IU] | Freq: Three times a day (TID) | SUBCUTANEOUS | Status: DC

## 2018-06-29 MED ORDER — SENNOSIDES-DOCUSATE SODIUM 8.6-50 MG PO TABS: 1.0000 | ORAL_TABLET | Freq: Every evening | ORAL | Status: DC | PRN

## 2018-06-29 MED ORDER — PHENYLEPHRINE HCL 10 MG/ML IJ SOLN
INTRAMUSCULAR | Status: DC | PRN
  Administered 2018-06-29 (×4): 100 ug via INTRAVENOUS

## 2018-06-29 MED ORDER — VANCOMYCIN HCL IN DEXTROSE 1-5 GM/200ML-% IV SOLN
INTRAVENOUS | Status: AC
  Administered 2018-06-29: 1000 mg via INTRAVENOUS
  Filled 2018-06-29: qty 200

## 2018-06-29 MED ORDER — SODIUM CHLORIDE 0.9 % IV SOLN
INTRAVENOUS | Status: DC | PRN
  Administered 2018-06-29: 40 mL

## 2018-06-29 MED ORDER — DEXAMETHASONE SODIUM PHOSPHATE 10 MG/ML IJ SOLN
INTRAMUSCULAR | Status: DC | PRN
  Administered 2018-06-29: 10 mg via INTRAVENOUS

## 2018-06-29 MED ORDER — SIMVASTATIN 20 MG PO TABS
20.0000 mg | ORAL_TABLET | Freq: Every day | ORAL | Status: DC
  Administered 2018-06-30 – 2018-07-07 (×8): 20 mg via ORAL
  Filled 2018-06-29 (×8): qty 1

## 2018-06-29 MED ORDER — SUCCINYLCHOLINE CHLORIDE 20 MG/ML IJ SOLN
INTRAMUSCULAR | Status: AC
  Filled 2018-06-29: qty 1

## 2018-06-29 MED ORDER — LACTATED RINGERS IV SOLN
INTRAVENOUS | Status: DC
  Administered 2018-06-29: 12:00:00 via INTRAVENOUS

## 2018-06-29 MED ORDER — LEVOTHYROXINE SODIUM 25 MCG PO TABS
125.0000 ug | ORAL_TABLET | Freq: Every day | ORAL | Status: DC
  Administered 2018-06-30 – 2018-07-07 (×7): 125 ug via ORAL
  Filled 2018-06-29 (×7): qty 1

## 2018-06-29 MED ORDER — SODIUM CHLORIDE 0.9 % IV SOLN
INTRAVENOUS | Status: DC
  Administered 2018-06-29: 17:00:00 via INTRAVENOUS

## 2018-06-29 MED ORDER — PROMETHAZINE HCL 25 MG/ML IJ SOLN: 6.2500 mg | INTRAMUSCULAR | Status: DC | PRN

## 2018-06-29 MED ORDER — OXYCODONE HCL 5 MG PO TABS
10.0000 mg | ORAL_TABLET | ORAL | Status: DC | PRN
  Administered 2018-07-01 – 2018-07-02 (×4): 10 mg via ORAL
  Filled 2018-06-29 (×5): qty 2

## 2018-06-29 MED ORDER — BUPIVACAINE HCL 0.5 % IJ SOLN
INTRAMUSCULAR | Status: DC | PRN
  Administered 2018-06-29: 20 mL

## 2018-06-29 MED ORDER — OXYCODONE HCL 5 MG PO TABS
5.0000 mg | ORAL_TABLET | ORAL | Status: DC | PRN
  Administered 2018-06-29 – 2018-07-07 (×10): 5 mg via ORAL
  Filled 2018-06-29 (×9): qty 1

## 2018-06-29 MED ORDER — FENTANYL CITRATE (PF) 250 MCG/5ML IJ SOLN
INTRAMUSCULAR | Status: AC
  Filled 2018-06-29: qty 5

## 2018-06-29 MED ORDER — FENTANYL CITRATE (PF) 100 MCG/2ML IJ SOLN
INTRAMUSCULAR | Status: DC | PRN
  Administered 2018-06-29: 50 ug via INTRAVENOUS
  Administered 2018-06-29: 100 ug via INTRAVENOUS

## 2018-06-29 MED ORDER — SODIUM CHLORIDE 0.9 % IR SOLN
Status: DC | PRN
  Administered 2018-06-29: 1000 mL

## 2018-06-29 MED ORDER — METHOCARBAMOL 1000 MG/10ML IJ SOLN
500.0000 mg | Freq: Four times a day (QID) | INTRAVENOUS | Status: DC | PRN
  Filled 2018-06-29: qty 5

## 2018-06-29 MED ORDER — LACTATED RINGERS IV SOLN
INTRAVENOUS | Status: DC | PRN
  Administered 2018-06-29: 12:00:00 via INTRAVENOUS

## 2018-06-29 MED ORDER — MONTELUKAST SODIUM 10 MG PO TABS
5.0000 mg | ORAL_TABLET | Freq: Every day | ORAL | Status: DC
  Administered 2018-06-29 – 2018-07-06 (×8): 5 mg via ORAL
  Filled 2018-06-29 (×8): qty 1

## 2018-06-29 MED ORDER — GEMFIBROZIL 600 MG PO TABS
600.0000 mg | ORAL_TABLET | Freq: Every day | ORAL | Status: DC
  Administered 2018-06-30 – 2018-07-07 (×8): 600 mg via ORAL
  Filled 2018-06-29 (×8): qty 1

## 2018-06-29 MED ORDER — CITALOPRAM HYDROBROMIDE 20 MG PO TABS
40.0000 mg | ORAL_TABLET | Freq: Every day | ORAL | Status: DC
  Administered 2018-06-30 – 2018-07-07 (×8): 40 mg via ORAL
  Filled 2018-06-29 (×9): qty 2

## 2018-06-29 MED ORDER — SODIUM CHLORIDE 0.9% FLUSH
3.0000 mL | Freq: Two times a day (BID) | INTRAVENOUS | Status: DC
  Administered 2018-06-29 – 2018-07-07 (×11): 3 mL via INTRAVENOUS

## 2018-06-29 MED ORDER — SODIUM CHLORIDE 0.9 % IV SOLN: 250.0000 mL | INTRAVENOUS | Status: DC

## 2018-06-29 MED ORDER — KETAMINE HCL 50 MG/ML IJ SOLN
INTRAMUSCULAR | Status: DC | PRN
  Administered 2018-06-29: 10 mg via INTRAMUSCULAR
  Administered 2018-06-29: 25 mg via INTRAVENOUS
  Administered 2018-06-29: 15 mg via INTRAMUSCULAR

## 2018-06-29 MED ORDER — IPRATROPIUM-ALBUTEROL 0.5-2.5 (3) MG/3ML IN SOLN: 3.0000 mL | Freq: Four times a day (QID) | RESPIRATORY_TRACT | Status: DC | PRN

## 2018-06-29 MED ORDER — HYDROMORPHONE HCL 1 MG/ML IJ SOLN
0.2500 mg | INTRAMUSCULAR | Status: AC | PRN
  Administered 2018-06-29 (×8): 0.25 mg via INTRAVENOUS

## 2018-06-29 SURGICAL SUPPLY — 81 items
BULB RESERV EVAC DRAIN JP 100C (MISCELLANEOUS) ×3 IMPLANT
BUR NEURO DRILL SOFT 3.0X3.8M (BURR) ×3 IMPLANT
CANISTER SUCT 1200ML W/VALVE (MISCELLANEOUS) ×6 IMPLANT
CHLORAPREP W/TINT 26ML (MISCELLANEOUS) ×6 IMPLANT
CLIP NEUROVISION LG (CLIP) ×3 IMPLANT
CNTNR SPEC 2.5X3XGRAD LEK (MISCELLANEOUS) ×1
CONT SPEC 4OZ STER OR WHT (MISCELLANEOUS) ×2
CONTAINER SPEC 2.5X3XGRAD LEK (MISCELLANEOUS) ×1 IMPLANT
COUNTER NEEDLE 20/40 LG (NEEDLE) ×3 IMPLANT
COVER LIGHT HANDLE STERIS (MISCELLANEOUS) ×6 IMPLANT
COVER WAND RF STERILE (DRAPES) ×3 IMPLANT
CUP MEDICINE 2OZ PLAST GRAD ST (MISCELLANEOUS) ×6 IMPLANT
DERMABOND ADVANCED (GAUZE/BANDAGES/DRESSINGS) ×2
DERMABOND ADVANCED .7 DNX12 (GAUZE/BANDAGES/DRESSINGS) ×1 IMPLANT
DRAIN CHANNEL JP 10F RND 20C F (MISCELLANEOUS) ×3 IMPLANT
DRAPE C-ARM 42X72 X-RAY (DRAPES) ×6 IMPLANT
DRAPE C-ARMOR (DRAPES) ×3 IMPLANT
DRAPE LAPAROTOMY 100X77 ABD (DRAPES) ×3 IMPLANT
DRAPE MICROSCOPE SPINE 48X150 (DRAPES) ×3 IMPLANT
DRAPE POUCH INSTRU U-SHP 10X18 (DRAPES) ×3 IMPLANT
DRAPE SURG 17X11 SM STRL (DRAPES) ×12 IMPLANT
DRSG OPSITE POSTOP 4X8 (GAUZE/BANDAGES/DRESSINGS) IMPLANT
DRSG TEGADERM 4X4.75 (GAUZE/BANDAGES/DRESSINGS) IMPLANT
ELECT CAUTERY BLADE TIP 2.5 (TIP) ×3
ELECT EZSTD 165MM 6.5IN (MISCELLANEOUS)
ELECT REM PT RETURN 9FT ADLT (ELECTROSURGICAL) ×3
ELECTRODE CAUTERY BLDE TIP 2.5 (TIP) ×1 IMPLANT
ELECTRODE EZSTD 165MM 6.5IN (MISCELLANEOUS) IMPLANT
ELECTRODE REM PT RTRN 9FT ADLT (ELECTROSURGICAL) ×1 IMPLANT
FRAME EYE SHIELD (PROTECTIVE WEAR) ×6 IMPLANT
GAUZE XEROFORM 4X4 STRL (GAUZE/BANDAGES/DRESSINGS) IMPLANT
GLOVE BIO SURGEON STRL SZ 6.5 (GLOVE) ×4 IMPLANT
GLOVE BIO SURGEONS STRL SZ 6.5 (GLOVE) ×2
GLOVE BIOGEL PI IND STRL 7.0 (GLOVE) ×1 IMPLANT
GLOVE BIOGEL PI INDICATOR 7.0 (GLOVE) ×2
GLOVE SURG SYN 7.0 (GLOVE) ×3 IMPLANT
GLOVE SURG SYN 8.5  E (GLOVE) ×6
GLOVE SURG SYN 8.5 E (GLOVE) ×3 IMPLANT
GOWN SRG XL LVL 3 NONREINFORCE (GOWNS) ×1 IMPLANT
GOWN STRL NON-REIN TWL XL LVL3 (GOWNS) ×2
GOWN STRL REUS W/TWL MED LVL3 (GOWN DISPOSABLE) ×3 IMPLANT
GRADUATE 1200CC STRL 31836 (MISCELLANEOUS) ×3 IMPLANT
GUIDEWIRE NITINOL BEVEL TIP (WIRE) ×15 IMPLANT
HEMOVAC 400CC 10FR (MISCELLANEOUS) IMPLANT
IMPL TLX 10X11X31 15DEG (Cage) ×1 IMPLANT
KIT NEEDLE NVM5 EMG ELECT (KITS) ×1 IMPLANT
KIT NEEDLE NVM5 EMG ELECTRODE (KITS) ×2
KIT SPINAL PRONEVIEW (KITS) ×3 IMPLANT
KNIFE BAYONET SHORT DISCETOMY (MISCELLANEOUS) IMPLANT
MARKER SKIN DUAL TIP RULER LAB (MISCELLANEOUS) ×6 IMPLANT
NDL SAFETY ECLIPSE 18X1.5 (NEEDLE) ×1 IMPLANT
NEEDLE HYPO 18GX1.5 SHARP (NEEDLE) ×2
NEEDLE HYPO 22GX1.5 SAFETY (NEEDLE) ×3 IMPLANT
NEEDLE I PASS (NEEDLE) ×3 IMPLANT
NS IRRIG 1000ML POUR BTL (IV SOLUTION) ×3 IMPLANT
PACK LAMINECTOMY NEURO (CUSTOM PROCEDURE TRAY) ×3 IMPLANT
PAD ARMBOARD 7.5X6 YLW CONV (MISCELLANEOUS) ×3 IMPLANT
PROBE BALL TIP NVM5 SNG USE (BALLOONS) ×3 IMPLANT
PUTTY DBM PROPEL LRG (Putty) ×2 IMPLANT
PUTTY PROPEL LRG (Putty) ×1 IMPLANT
ROD RELINE MAS LORD 5.5X45MM (Rod) ×3 IMPLANT
ROD RELINE MAS TI LORD 5.5X40 (Rod) ×3 IMPLANT
SCREW LOCK RELINE 5.5 TULIP (Screw) ×12 IMPLANT
SCREW RELINE MAS RED 7.5X50MM (Screw) ×3 IMPLANT
SCREW RELINE RED 6.5X45MM POLY (Screw) ×12 IMPLANT
SPOGE SURGIFLO 8M (HEMOSTASIS) ×2
SPONGE DRAIN TRACH 4X4 STRL 2S (GAUZE/BANDAGES/DRESSINGS) IMPLANT
SPONGE SURGIFLO 8M (HEMOSTASIS) ×1 IMPLANT
STAPLER SKIN PROX 35W (STAPLE) IMPLANT
SUT DVC VLOC 3-0 CL 6 P-12 (SUTURE) ×3 IMPLANT
SUT VIC AB 0 CT1 27 (SUTURE) ×2
SUT VIC AB 0 CT1 27XCR 8 STRN (SUTURE) ×1 IMPLANT
SUT VIC AB 2-0 CT1 18 (SUTURE) ×3 IMPLANT
SYR 20CC LL (SYRINGE) ×3 IMPLANT
SYR 30ML LL (SYRINGE) ×6 IMPLANT
SYR 3ML LL SCALE MARK (SYRINGE) ×3 IMPLANT
TLX IMPLANT 10X11X31 15DEG (Cage) ×3 IMPLANT
TOWEL OR 17X26 4PK STRL BLUE (TOWEL DISPOSABLE) ×9 IMPLANT
TRAY FOLEY SLVR 16FR LF STAT (SET/KITS/TRAYS/PACK) ×3 IMPLANT
TUBING CONNECTING 10 (TUBING) ×2 IMPLANT
TUBING CONNECTING 10' (TUBING) ×1

## 2018-06-29 NOTE — Anesthesia Post-op Follow-up Note (Signed)
Anesthesia QCDR form completed.        

## 2018-06-29 NOTE — Progress Notes (Signed)
Procedure: L4-5 TLIF Procedure Date: 06/29/2018 Diagnosis: Lumbar radiculopathy  History: Danielle Taylor is here for L4-5 TLIF for lumbar radiculopathy. POD0: evaluated while still waking up from anesthesia but able to answer questions and follow most commands.  Pain currently 5/10, most prominent in back. Denies any lower extremity symptoms at this time including pain/numbness/tingling.    Physical Exam: Vitals:   06/29/18 1114  BP: (!) 145/83  Pulse: 73  Resp: 16  Temp: 98.1 F (36.7 C)  SpO2: 96%    AA Ox3 Strength: 5/5 upper and lower extremities.   Sensation: intact and symmetric upper and lower extremities.  Data:  Recent Labs  Lab 06/23/18 1409  NA 139  K 4.2  CL 105  CO2 22  BUN 26*  CREATININE 0.74  GLUCOSE 90  CALCIUM 9.5   No results for input(s): AST, ALT, ALKPHOS in the last 168 hours.  Invalid input(s): TBILI   Recent Labs  Lab 06/23/18 1409  WBC 7.1  HGB 14.1  HCT 42.2  PLT 293   Recent Labs  Lab 06/23/18 1409  APTT 40*  INR 1.51         Other tests/results: Lumbar xrays pending  Assessment/Plan:  Danielle Taylor is POD0 s/p L4-5 TLIF. She is recovering well and symptoms that were present prior to surgery seem to be resolved. Will continue to observe to monitor progress and provide pain control.  - mobilize - pain control - DVT prophylaxis  Danielle Olp PA-C Department of Neurosurgery

## 2018-06-29 NOTE — Op Note (Signed)
Indications: Danielle Taylor is a 72 yo female who presented with spondylolisthesis. She failed conservative management, and elected for surgical intervention.  Findings: correction of spondylolisthesis  Preoperative Diagnosis: Spondylolisthesis Postoperative Diagnosis: same   EBL: 25 ml IVF: 1200 ml Drains: 1 subfascial Disposition: Extubated and Stable to PACU Complications: none  No foley catheter was placed.   Preoperative Note:   Risks of surgery discussed include: infection, bleeding, stroke, coma, death, paralysis, CSF leak, nerve/spinal cord injury, numbness, tingling, weakness, complex regional pain syndrome, recurrent stenosis and/or disc herniation, vascular injury, development of instability, neck/back pain, need for further surgery, persistent symptoms, development of deformity, and the risks of anesthesia. The patient understood these risks and agreed to proceed.  Operative Note:  1. Transforaminal Lumbar Interbody Fusion L4/5 2. Posterolateral arthrodesis L4 to L5 3. Posterior nonsegmental instrumentation L4 to L5 4. Lumbar decompression including central decompression, bilateral medial facetectomies, and bilateral foraminotomies at L4/5 5. Harvesting of autograft via the same incision 6. Placement of a biomechanical device (Nuvasive TLX) at L4/5 for anterior arthrodesis  The patient was brought to the Operating Room, intubated and turned into the prone position. All pressure points were checked and double checked. Flouroscopy was used to mark bilateral [place] incisions. The patient was prepped and draped in the standard fashion. A full timeout was performed. Preoperative antibiotics were given. The incisions were injected with local anesthetic.  Each incision was opened with a scalpel, bovie electrocautery was used to open the fascia. Using AP flouroscopy, a Jamsheedi needle was placed at the center portion of the L5 pedicle radiographically. Triggered EMG was used  during pedicle cannulation, without any readings below a 12 mAmp threshold during cannulation. The stylet was removed, and a K wire placed and secured.   We then moved to L4. Using AP flouroscopy, a Jamsheedi needle was placed at the center portion of the L4 pedicle radiographically. Triggered EMG was used during pedicle cannulation, without any readings below a 12 mAmp threshold during cannulation. The stylet was removed, and a K wire placed and secured.   After placement of all K wires, lateral flouroscopy was used to confirm placement in the pedicles. We then used a cannulated tap to tap each tract, and placed Nuvasive Reline pedicle screw shafts at each level (6.5 mm x 45 mm at L4, and 6.5 mm x 45 mm at L5).   We then turned attention to performing the transforaminal decompression and interbody fusion. A separate fascial incision was made, and a tubular retractor was placed on the right at L4/5. The microscope was brought into the field.  The right L4/5 facet was removed with osteotomes and the drill, and handed off for preparation as autograft. The traversing and exiting nerve roots on the right were identified and protected. The disc was opened using a scalpel. After incising the disc space, we took a combination of pituitary rongeurs, Kerrison rongeurs, disc scrapers, and curettes to remove a majority of the disc material.  We prepared the end plates for accepting the interbody fusion.  We removed the cartilaginous plate, preserved the cortical endplate if possible during this procedure.  The disc space was irrigated. The Nuvasive TLX  TLIF biomechanical device (10 mm height x 11 mm width x 31 mm length, 15 degree lordotic) was inserted using flouroscopy, then backfilled with a mixture of allograft and autograft. During placement, the nerve roots and dural sac were carefully protected without any leaks identified. After placement of the device, the retractor was removed.  After  placement of the  biomechanical device, additional compression of the neural elements was noted. To decompress the neural elements at L4/5, the drill was used to extend the laminoforaminotomy laterally until the traversing and exiting nerve roots were fully decompressed.  Additionally, the drill was used to removed the base of the spinous process and the contralateral lamina until the contralateral medial facet was identified and palpated. Using a [facility] and curettes, the ligamentum flavum at L4/5 was dissected completely free of the dura. Using punches and curettes, the ligamentum flavum was removed in its entirety at L4/5 for a complete decompression from facet to facet.  The contralateral L5 nerve root was palpated, and the Kerrison punch used to remove the remainder of the medial facet and foramen until the dural sac and nerve roots were complete decompressed. The heads of the pedicle screws were attached, then rods measured and placed. The rods were secured using locking caps to manufacturer's specifications. Final AP and lateral radiographs were taken to confirm placement of instrumentation and appropriate alignment. The wound was copiously irrigated, then the left L4-5 facet was prepared for arthrodesis. A mixture of allograft and autograft was placed for arthrodesis.   A subfascial drain was placed on the right.  After hemostasis, the wound was closed in layers with 0 and 2-0 vicryl. 3-0 monocryl and dermabond was applied to the incision. A sterile dressing was placed.  The patient was then flipped supine and extubated with incident. All counts were correct times 2 at the end of the case. No immediate complications were noted.   Danielle Olp PA assisted in all portions of the procedure.  [name] MD

## 2018-06-29 NOTE — Anesthesia Preprocedure Evaluation (Addendum)
Anesthesia Evaluation  Patient identified by MRN, date of birth, ID band Patient awake    Reviewed: Allergy & Precautions, H&P , NPO status , Patient's Chart, lab work & pertinent test results  Airway Mallampati: III       Dental  (+) Poor Dentition   Pulmonary neg pulmonary ROS, asthma , COPD, former smoker,           Cardiovascular hypertension, + CAD  negative cardio ROS  + dysrhythmias (complete heart block s/p permanent pacemaker 2012) + pacemaker      Neuro/Psych PSYCHIATRIC DISORDERS Depression negative neurological ROS  negative psych ROS   GI/Hepatic negative GI ROS, Neg liver ROS, hiatal hernia, GERD  ,  Endo/Other  negative endocrine ROS  Renal/GU      Musculoskeletal   Abdominal   Peds  Hematology negative hematology ROS (+)   Anesthesia Other Findings Past Medical History: No date: Allergy No date: COPD (chronic obstructive pulmonary disease) (HCC) No date: Depression No date: Dysrhythmia     Comment:  Complete Heart Block No date: Gastritis, chronic No date: GERD (gastroesophageal reflux disease) No date: Graves disease No date: History of hiatal hernia No date: Hyperlipidemia No date: Hypertension No date: Hypothyroidism No date: Presence of permanent cardiac pacemaker     Comment:  2012  Past Surgical History: No date: APPENDECTOMY 2000: COLONOSCOPY 04/26/2015: COLONOSCOPY WITH PROPOFOL; N/A     Comment:  Procedure: COLONOSCOPY WITH PROPOFOL;  Surgeon: Hulen Luster, MD;  Location: [facility] ENDOSCOPY;  Service:               Gastroenterology;  Laterality: N/A; No date: fibroid tumors     Comment:  fingers and wrist No date: FOOT SURGERY No date: TONSILLECTOMY No date: VAGINAL HYSTERECTOMY     Reproductive/Obstetrics negative OB ROS                            Anesthesia Physical Anesthesia Plan  ASA: III  Anesthesia Plan: General ETT   Post-op  Pain Management:    Induction:   PONV Risk Score and Plan: Ondansetron, Dexamethasone and Midazolam  Airway Management Planned:   Additional Equipment:   Intra-op Plan:   Post-operative Plan:   Informed Consent: I have reviewed the patients History and Physical, chart, labs and discussed the procedure including the risks, benefits and alternatives for the proposed anesthesia with the patient or authorized representative who has indicated his/her understanding and acceptance.   Dental Advisory Given  Plan Discussed with: Anesthesiologist, CRNA and Surgeon  Anesthesia Plan Comments: ([employer] pacemaker, reprogrammed to DOO pre-op, will interrogate after procedure.)      Anesthesia Quick Evaluation

## 2018-06-29 NOTE — Transfer of Care (Signed)
Immediate Anesthesia Transfer of Care Note  Patient: Danielle Taylor [facility]  Procedure(s) Performed: TRANSFORAMINAL LUMBAR INTERBODY FUSION (TLIF) WITH PEDICLE SCREW FIXATION 1 LEVEL (N/A Back)  Patient Location: PACU  Anesthesia Type:General  Level of Consciousness: awake and alert   Airway & Oxygen Therapy: Patient Spontanous Breathing and Patient connected to face mask oxygen  Post-op Assessment: Report given to RN, Post -op Vital signs reviewed and stable and Patient moving all extremities  Post vital signs: Reviewed  Last Vitals:  Vitals Value Taken Time  BP 169/58 06/29/2018  3:49 PM  Temp 36.6 C 06/29/2018  3:47 PM  Pulse 80 06/29/2018  3:49 PM  Resp 17 06/29/2018  3:49 PM  SpO2 99 % 06/29/2018  3:49 PM    Last Pain:  Vitals:   06/29/18 1114  TempSrc: Oral  PainSc: 0-No pain         Complications: No apparent anesthesia complications

## 2018-06-29 NOTE — Anesthesia Procedure Notes (Signed)
Procedure Name: Intubation Date/Time: 06/29/2018 12:10 PM Performed by: Jacqualine Mau, RN Pre-anesthesia Checklist: Patient identified, Patient being monitored, Timeout performed, Emergency Drugs available and Suction available Patient Re-evaluated:Patient Re-evaluated prior to induction Oxygen Delivery Method: Circle system utilized Preoxygenation: Pre-oxygenation with 100% oxygen Induction Type: IV induction Ventilation: Mask ventilation without difficulty Laryngoscope Size: Mac and 3 Grade View: Grade I Tube type: Oral Tube size: 7.0 mm Number of attempts: 1 Airway Equipment and Method: Stylet Placement Confirmation: ETT inserted through vocal cords under direct vision,  positive ETCO2 and breath sounds checked- equal and bilateral Secured at: 21 cm Tube secured with: Tape Dental Injury: Teeth and Oropharynx as per pre-operative assessment

## 2018-06-29 NOTE — H&P (Signed)
Heart sounds normal no MRG. Chest Clear to Auscultation Bilaterally.  I have reviewed and confirmed my history and physical from 06/09/2018 with no additions or changes. Plan for L4-5 TLIF.  Risks and benefits reviewed.

## 2018-06-29 NOTE — Anesthesia Postprocedure Evaluation (Signed)
Anesthesia Post Note  Patient: Myiesha Edgar [facility]  Procedure(s) Performed: TRANSFORAMINAL LUMBAR INTERBODY FUSION (TLIF) WITH PEDICLE SCREW FIXATION 1 LEVEL (N/A Back)  Patient location during evaluation: PACU Anesthesia Type: General Level of consciousness: awake and alert Pain management: pain level controlled Vital Signs Assessment: post-procedure vital signs reviewed and stable Respiratory status: spontaneous breathing, nonlabored ventilation, respiratory function stable and patient connected to nasal cannula oxygen Cardiovascular status: blood pressure returned to baseline and stable Postop Assessment: no apparent nausea or vomiting Anesthetic complications: no Comments: Pacemaker placed back into pre Op setting in the PACU post op by pacemaker tech     Last Vitals:  Vitals:   06/29/18 1640 06/29/18 1647  BP:  132/78  Pulse: 92 95  Resp: 10 15  Temp:  36.8 C  SpO2: 91% 92%    Last Pain:  Vitals:   06/29/18 1647  TempSrc:   PainSc: 3                  Precious Haws Deaisa Merida

## 2018-06-30 ENCOUNTER — Inpatient Hospital Stay: Payer: Medicare Other

## 2018-06-30 ENCOUNTER — Encounter: Payer: Self-pay | Admitting: Neurosurgery

## 2018-06-30 MED ORDER — DEXAMETHASONE SODIUM PHOSPHATE 4 MG/ML IJ SOLN
4.0000 mg | Freq: Four times a day (QID) | INTRAMUSCULAR | Status: DC
  Administered 2018-06-30 – 2018-07-02 (×6): 4 mg via INTRAVENOUS
  Filled 2018-06-30 (×8): qty 1

## 2018-06-30 MED ORDER — DEXAMETHASONE SODIUM PHOSPHATE 10 MG/ML IJ SOLN
10.0000 mg | Freq: Once | INTRAMUSCULAR | Status: AC
  Administered 2018-06-30: 10 mg via INTRAVENOUS
  Filled 2018-06-30: qty 1

## 2018-06-30 MED ORDER — GABAPENTIN 300 MG PO CAPS
300.0000 mg | ORAL_CAPSULE | Freq: Three times a day (TID) | ORAL | Status: DC
  Administered 2018-06-30 – 2018-07-07 (×21): 300 mg via ORAL
  Filled 2018-06-30 (×20): qty 1

## 2018-06-30 MED ORDER — PANTOPRAZOLE SODIUM 40 MG PO TBEC
40.0000 mg | DELAYED_RELEASE_TABLET | Freq: Every day | ORAL | Status: DC
  Administered 2018-06-30 – 2018-07-07 (×8): 40 mg via ORAL
  Filled 2018-06-30 (×8): qty 1

## 2018-06-30 MED ORDER — GABAPENTIN 100 MG PO CAPS
100.0000 mg | ORAL_CAPSULE | Freq: Three times a day (TID) | ORAL | Status: DC
  Administered 2018-06-30: 100 mg via ORAL
  Filled 2018-06-30: qty 1

## 2018-06-30 MED ORDER — POTASSIUM CHLORIDE IN NACL 20-0.9 MEQ/L-% IV SOLN
INTRAVENOUS | Status: DC
  Administered 2018-06-30 – 2018-07-02 (×3): via INTRAVENOUS
  Filled 2018-06-30 (×5): qty 1000

## 2018-06-30 NOTE — Progress Notes (Signed)
Pt was unable to ambulate due to numbness in her lower extremities. Medicated for pain with relief.

## 2018-06-30 NOTE — Progress Notes (Signed)
I have reviewed the CT scan and had one of my partners review it as well.  There is no evidence of implant complication on this CT scan.  All screws and the TLIF spacer are in appropriate position.  I do not see evidence of a hematoma.  The radiology read is copied below.  I have recommended steroids and continued therapy.  We will replace her foley catheter, as she has limited ability to control her urination with the current device and her limited mobility.  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  This will likely improve, but will take time.  I have recommended steroids and continued therapy. She may ultimately need rehabilitation or therapy at home.  I spoke with her for 20 minutes and gave opportunity to answer questions to the best of my ability.  [name] MD

## 2018-06-30 NOTE — Progress Notes (Signed)
PT Cancellation Note  Patient Details Name: Danielle Taylor MRN: [id number] DOB: March 17, 1946   Cancelled Treatment:    Reason Eval/Treat Not Completed: Medical issues which prohibited therapy.  Spoke with RN and OT who reported pt with significant functional weakness compared to her baseline with decreased sensation BLE (not pt's baseline).  Pt additionally incontinent of urine with sit>stand (not pt's baseline).  Spoke with Dr. Izora Ribas about the above who is planning to come see the pt this morning.  This PT will wait until imaging completed and pt medically cleared before completing PT evaluation.     Collie Siad PT, DPT 06/30/2018, 10:28 AM

## 2018-06-30 NOTE — Progress Notes (Signed)
After consultation with Dr. Saralyn Pilar, we have decided that Danielle Taylor is safe to proceed with a lumbar spine MRI at [facility] under emergency circumstances using the settings approved for her MRI on June 02, 2018.    Dr. Saralyn Pilar is available should any intervention or reprogramming be necessary.

## 2018-06-30 NOTE — Clinical Social Work Placement (Signed)
   CLINICAL SOCIAL WORK PLACEMENT  NOTE  Date:  06/30/2018  Patient Details  Name: Danielle Taylor MRN: [id number] Date of Birth: [birth year]  Clinical Social Work is seeking post-discharge placement for this patient at the [place] level of care (*CSW will initial, date and re-position this form in  chart as items are completed):  Yes   Patient/family provided with [place] Work Department's list of facilities offering this level of care within the geographic area requested by the patient (or if unable, by the patient's family).  Yes   Patient/family informed of their freedom to choose among providers that offer the needed level of care, that participate in Medicare, Medicaid or managed care program needed by the patient, have an available bed and are willing to accept the patient.  Yes   Patient/family informed of [facility]'s ownership interest in [facility] and [facility], as well as of the fact that they are under no obligation to receive care at these facilities.  PASRR submitted to EDS on 06/30/18     PASRR number received on 06/30/18     Existing PASRR number confirmed on       FL2 transmitted to all facilities in geographic area requested by pt/family on 06/30/18     FL2 transmitted to all facilities within larger geographic area on       Patient informed that his/her managed care company has contracts with or will negotiate with certain facilities, including the following:            Patient/family informed of bed offers received.  Patient chooses bed at       Physician recommends and patient chooses bed at      Patient to be transferred to   on  .  Patient to be transferred to facility by       Patient family notified on   of transfer.  Name of family member notified:        PHYSICIAN       Additional Comment:    _______________________________________________ Alyssabeth Bruster, Veronia Beets, LCSW 06/30/2018, 4:54  PM

## 2018-06-30 NOTE — [place] FL2 (Signed)
[place] LEVEL OF CARE SCREENING TOOL     IDENTIFICATION  Patient Name: Danielle Taylor Birthdate: February 14, 1946 Sex: female Admission Date (Current Location): 06/29/2018  [place] and [place] Number:  Engineering geologist and Address:  [facility], [address], [place], [place] [address]      Provider Number: [id number]  Attending Physician Name and Address:  [name], MD  Relative Name and Phone Number:       Current Level of Care: Hospital Recommended Level of Care: [place] Prior Approval Number:    Date Approved/Denied:   PASRR Number: [id number])  Discharge Plan: SNF    Current Diagnoses: Patient Active Problem List   Diagnosis Date Noted  . Lumbar radiculopathy 06/29/2018  . Reactive airway disease, mild intermittent, uncomplicated 39/76/7341  . Chronic seasonal allergic rhinitis due to pollen 08/10/2017  . Mixed hyperlipidemia 08/10/2017  . Depression 08/10/2017  . Class 1 obesity due to excess calories without serious comorbidity with body mass index (BMI) of 34.0 to 34.9 in adult 08/10/2017  . Centrilobular emphysema ([place]) 04/02/2017  . Lumbar disc disease 04/02/2017  . Hepatic steatosis 04/02/2017  . Atherosclerosis of coronary artery of native heart 04/02/2017  . Syncope and collapse 03/11/2016  . Right leg weakness 03/11/2016  . Essential hypertension 03/11/2016  . Hyperglycemia 03/11/2016  . Reactive airway disease 03/04/2015  . Hypothyroid 12/07/2014  . Familial multiple lipoprotein-type hyperlipidemia 12/07/2014  . Acute bronchitis 12/07/2014  . Recurrent major depressive episodes ([place]) 12/07/2014  . Essential (primary) hypertension 12/07/2014  . H/O endocrine disorder 12/07/2014  . Pre-operative examination 12/07/2014    Orientation RESPIRATION BLADDER Height & Weight     Self, Time, Situation, Place  O2(1 Liter Oxygen. ) Continent Weight:   Height:     BEHAVIORAL  SYMPTOMS/MOOD NEUROLOGICAL BOWEL NUTRITION STATUS      Continent Diet(Diet: Regular )  AMBULATORY STATUS COMMUNICATION OF NEEDS Skin   Extensive Assist Verbally Surgical wounds(Incision: Back )                       Personal Care Assistance Level of Assistance  Bathing, Feeding, Dressing Bathing Assistance: Limited assistance Feeding assistance: Independent Dressing Assistance: Limited assistance     Functional Limitations Info  Sight, Hearing, Speech Sight Info: Impaired Hearing Info: Adequate Speech Info: Adequate    SPECIAL CARE FACTORS FREQUENCY  PT (By licensed PT), OT (By licensed OT)     PT Frequency: (5) OT Frequency: (5)            Contractures      Additional Factors Info  Code Status, Allergies Code Status Info: (Full Code. ) Allergies Info: (Penicillins)           Current Medications (06/30/2018):  This is the current hospital active medication list Current Facility-Administered Medications  Medication Dose Route Frequency Provider Last Rate Last Dose  . 0.9 %  sodium chloride infusion   Intravenous Continuous Marin Olp, PA-C   Stopped at 06/30/18 9379  . 0.9 %  sodium chloride infusion  250 mL Intravenous Continuous Marin Olp, PA-C      . 0.9 % NaCl with KCl 20 mEq/ L  infusion   Intravenous Continuous [name], MD 75 mL/hr at 06/30/18 1247    . acetaminophen (TYLENOL) tablet 650 mg  650 mg Oral Q4H PRN Marin Olp, PA-C   650 mg at 06/30/18 1534   Or  . acetaminophen (TYLENOL) suppository 650 mg  650  mg Rectal Q4H PRN Marin Olp, PA-C      . acetaminophen (TYLENOL) tablet 1,000 mg  1,000 mg Oral Q6H Marin Olp, PA-C   1,000 mg at 06/30/18 1238  . albuterol (PROVENTIL) (2.5 MG/3ML) 0.083% nebulizer solution 2.5 mg  2.5 mg Nebulization Once Ferri, Amanda, PA-C      . citalopram (CELEXA) tablet 40 mg  40 mg Oral Daily Marin Olp, PA-C   40 mg at 06/30/18 0849  . dexamethasone (DECADRON) injection 4 mg  4 mg Intravenous  Q6H [name], MD      . gabapentin (NEURONTIN) capsule 300 mg  300 mg Oral TID [name], MD   300 mg at 06/30/18 1534  . gemfibrozil (LOPID) tablet 600 mg  600 mg Oral Daily Marin Olp, PA-C   600 mg at 06/30/18 0853  . HYDROmorphone (DILAUDID) injection 0.5 mg  0.5 mg Intravenous Q2H PRN Marin Olp, PA-C   0.5 mg at 06/29/18 2254  . ipratropium-albuterol (DUONEB) 0.5-2.5 (3) MG/3ML nebulizer solution 3 mL  3 mL Nebulization Q6H PRN Marin Olp, PA-C      . levothyroxine (SYNTHROID, LEVOTHROID) tablet 125 mcg  125 mcg Oral Q0600 Marin Olp, PA-C   125 mcg at 06/30/18 0610  . loratadine (CLARITIN) tablet 10 mg  10 mg Oral Daily Marin Olp, PA-C   10 mg at 06/30/18 1610  . losartan (COZAAR) tablet 50 mg  50 mg Oral Daily Marin Olp, PA-C   50 mg at 06/30/18 0851  . menthol-cetylpyridinium (CEPACOL) lozenge 3 mg  1 lozenge Oral PRN Marin Olp, PA-C       Or  . phenol (CHLORASEPTIC) mouth spray 1 spray  1 spray Mouth/Throat PRN Marin Olp, PA-C      . methocarbamol (ROBAXIN) tablet 500 mg  500 mg Oral Q6H PRN Marin Olp, PA-C   500 mg at 06/30/18 1522   Or  . methocarbamol (ROBAXIN) 500 mg in dextrose 5 % 50 mL IVPB  500 mg Intravenous Q6H PRN Marin Olp, PA-C      . metoprolol succinate (TOPROL-XL) 24 hr tablet 50 mg  50 mg Oral Daily Marin Olp, PA-C   50 mg at 06/30/18 9604  . montelukast (SINGULAIR) tablet 5 mg  5 mg Oral QHS Marin Olp, PA-C   5 mg at 06/29/18 0500  . ondansetron (ZOFRAN) tablet 4 mg  4 mg Oral Q6H PRN Marin Olp, PA-C       Or  . ondansetron [facility]) injection 4 mg  4 mg Intravenous Q6H PRN Marin Olp, PA-C      . oxyCODONE (Oxy IR/ROXICODONE) immediate release tablet 10 mg  10 mg Oral Q3H PRN Marin Olp, PA-C      . oxyCODONE (Oxy IR/ROXICODONE) immediate release tablet 5 mg  5 mg Oral Q3H PRN Marin Olp, PA-C   5 mg at 06/30/18 1238  . pantoprazole (PROTONIX) EC tablet 40 mg  40 mg Oral Daily [name], MD   40 mg at 06/30/18 1327  . senna-docusate (Senokot-S) tablet 1 tablet  1 tablet Oral QHS PRN Marin Olp, PA-C      . simvastatin (ZOCOR) tablet 20 mg  20 mg Oral Daily Marin Olp, PA-C   20 mg at 06/30/18 0851  . sodium chloride flush (NS) 0.9 % injection 3 mL  3 mL Intravenous Q12H Marin Olp, PA-C   3 mL at 06/30/18 0853  . sodium chloride flush (NS) 0.9 % injection 3 mL  3 mL Intravenous PRN Marin Olp, PA-C      .  umeclidinium-vilanterol (ANORO ELLIPTA) 62.5-25 MCG/INH 1 puff  1 puff Inhalation Daily Marin Olp, PA-C   1 puff at 06/30/18 8676     Discharge Medications: Please see discharge summary for a list of discharge medications.  Relevant Imaging Results:  Relevant Lab Results:   Additional Information (SSN: [id number])  Marcellino Fidalgo, Veronia Beets, LCSW

## 2018-06-30 NOTE — Clinical Social Work Note (Signed)
Clinical Social Work Assessment  Patient Details  Name: Danielle Taylor MRN: [id number] Date of Birth: 07-25-1946  Date of referral:  06/30/18               Reason for consult:  Facility Placement                Permission sought to share information with:  Chartered certified accountant granted to share information::  Yes, Verbal Permission Granted  Name::      [place]::   [place]   Relationship::     Contact Information:     Housing/Transportation Living arrangements for the past 2 months:  [place] of Information:  Patient Patient Interpreter Needed:  None Criminal Activity/Legal Involvement Pertinent to Current Situation/Hospitalization:  No - Comment as needed Significant Relationships:  Spouse Lives with:  Spouse Do you feel safe going back to the place where you live?  Yes Need for family participation in patient care:  Yes (Comment)  Care giving concerns:  Patient lives in [place] with her husband Jenny Reichmann.    Social Worker assessment / plan:  Holiday representative (CSW) received verbal consult from Chief Strategy Officer that PT recommendation is SNF. CSW met with patient alone at bedside to discuss D/C plan. Patient was alert and oriented X4 and was laying in the bed. CSW introduced self and explained role of CSW department. Per patient she lives in [place] with her husband. CSW explained SNF process and that medicare requires a 3 night qualifying inpatient stay in a hospital in order to pay for SNF. Patient was admitted to inpatient 06/29/18. Patient verbalized her understanding and is agreeable to SNF search in [place]. FL2 complete and faxed out. Patient reported that [facility] may be a preference for her. CSW will continue to follow and assist as needed.   Employment status:  Retired Forensic scientist:  [employer] PT Recommendations:  [place] / Referral to community resources:   [place]  Patient/Family's Response to care:  Patient is agreeable to [facility] in [place].   Patient/Family's Understanding of and Emotional Response to Diagnosis, Current Treatment, and Prognosis:  Patient was very pleasant and thanked CSW for assistance.   Emotional Assessment Appearance:  Appears stated age Attitude/Demeanor/Rapport:    Affect (typically observed):  Accepting, Adaptable, Pleasant Orientation:  Oriented to Self, Oriented to Place, Oriented to  Time, Oriented to Situation Alcohol / Substance use:  Not Applicable Psych involvement (Current and /or in the community):  No (Comment)  Discharge Needs  Concerns to be addressed:  Discharge Planning Concerns Readmission within the last 30 days:  No Current discharge risk:  Dependent with Mobility Barriers to Discharge:  Continued Medical Work up   [employer], Veronia Beets, LCSW 06/30/2018, 4:54 PM

## 2018-06-30 NOTE — Evaluation (Signed)
Physical Therapy Evaluation Patient Details Name: Danielle Taylor MRN: [id number] DOB: September 06, 1945 Today's Date: 06/30/2018   History of Present Illness  72yo female pt POD#1 s/p L4-5 TLIF. On 11/7 after surgery pt with new onset urinary incontinence and diminished sensation and strength BLEs.  CT scan ordered and following results, Dr. Izora Ribas requested PT to complete evaluation.  PMHx includes lumbar radiculopathy, ashtma, COPD, HTN, CAD, depression, Graves disease, and HLD.    Clinical Impression  Patient is s/p above surgery resulting in functional limitations due to the deficits listed below (see PT Problem List). RLE Deficits/Details: Eversion 0/5, DF 2-/5, great toe E 3-/5, 2nd-5th toe E 1/5, PF 2/5, knee F/E 4/5 but functionally with significant knee instability in standing, hip F 3-/5, hip Abd/Add 4/5. Numbness plantar and dorsal surface of foot. LLE Deficits / Details: Eversion 1/5, DF 2-/5, great toe E 3/5, 2nd-5th toe E 3/5, PF 2/5, knee F/E 4/5 but functionally with significant knee instability in standing with buckle, hip F 3-/5, hip Abd/Add 4/5. Sensation WNL.  BLE negative for clonus.  After performing SLR RLE demonstrates involuntary tremor. Pt requires min+2 assist for sit<>stand transfers.  Unable to perform weight shift with SLS due to BLE weakness and L knee buckle.  Pt fatigues after 4 attempts at SLS.  Given pt's current mobility status, recommending SNF at d/c. Patient will benefit from skilled PT to increase their independence and safety with mobility to allow discharge to the venue listed below.      Follow Up Recommendations SNF    Equipment Recommendations  Rolling walker with 5" wheels    Recommendations for Other Services       Precautions / Restrictions Precautions Precautions: Back;Fall Precaution Booklet Issued: (provided by OT) Precaution Comments: Pt recalled no bending, twisting.  Reminded of no arching.  Per verbal from Dr. Izora Ribas, no brace  needed. Restrictions Weight Bearing Restrictions: No      Mobility  Bed Mobility Overal bed mobility: Needs Assistance Bed Mobility: Sidelying to Sit;Rolling;Sit to Sidelying Rolling: Min guard Sidelying to sit: Mod assist     Sit to sidelying: Mod assist General bed mobility comments: Pt requires assist to bring LEs off EOB with sidelying>sit and assist to bring LEs into bed with sit>sidelying.   Transfers Overall transfer level: Needs assistance Equipment used: Rolling walker (2 wheeled) Transfers: Sit to/from Stand Sit to Stand: Min assist;+2 physical assistance         General transfer comment: Min +2 assist to boost to standing and to remain steady.  Cues for proper hand placement and safe technique.   Ambulation/Gait             General Gait Details: Unable at this time.   Stairs            Wheelchair Mobility    Modified Rankin (Stroke Patients Only)       Balance Overall balance assessment: Needs assistance Sitting-balance support: No upper extremity supported;Feet supported Sitting balance-Leahy Scale: Good Sitting balance - Comments: Pt able to sit EOB without UE support   Standing balance support: Bilateral upper extremity supported;During functional activity Standing balance-Leahy Scale: Poor Standing balance comment: requires B hands on RW for support                             Pertinent Vitals/Pain Pain Assessment: 0-10 Pain Score: 6  Pain Location: lumbar spine/site of surgery Pain Descriptors / Indicators: Aching;Nagging Pain Intervention(s): Limited  activity within patient's tolerance;Monitored during session;Repositioned;Utilized relaxation techniques    Home Living Family/patient expects to be discharged to:: Private residence Living Arrangements: Spouse/significant other Available Help at Discharge: Family;Available PRN/intermittently Type of Home: House Home Access: Stairs to enter Entrance Stairs-Rails: Can  reach both;Left;Right Entrance Stairs-Number of Steps: 4 Home Layout: One level Home Equipment: None      Prior Function Level of Independence: Independent         Comments: Pt independent, but ambulation and standing limited 2/2 back pain (ambulated short distances, could stand for <5 minutes at a time). Indep with ADL, driving. Cleaning services 2x/mo, spouse cooks     Hand Dominance   Dominant Hand: Right    Extremity/Trunk Assessment   Upper Extremity Assessment Upper Extremity Assessment: Defer to OT evaluation    Lower Extremity Assessment Lower Extremity Assessment: RLE deficits/detail;LLE deficits/detail RLE Deficits / Details: Eversion 0/5, DF 2-/5, great toe E 3-/5, 2nd-5th toe E 1/5, PF 2/5, knee F/E 4/5 but functionally with significant knee instability in standing, hip F 3-/5, hip Abd/Add 4/5. Numbness plantar and dorsal surface of foot.  LLE Deficits / Details: Eversion 1/5, DF 2-/5, great toe E 3/5, 2nd-5th toe E 3/5, PF 2/5, knee F/E 4/5 but functionally with significant knee instability in standing with buckle, hip F 3-/5, hip Abd/Add 4/5. Sensation WNL.     Cervical / Trunk Assessment Cervical / Trunk Assessment: Other exceptions Cervical / Trunk Exceptions: s/p back surgery  Communication   Communication: No difficulties  Cognition Arousal/Alertness: Awake/alert Behavior During Therapy: WFL for tasks assessed/performed Overall Cognitive Status: Within Functional Limits for tasks assessed                                        General Comments General comments (skin integrity, edema, etc.): BLE negative for clonus.  After performing SLR RLE demonstrates involuntary tremor.     Exercises Other Exercises Other Exercises: Attempted L SLS in standing but pt demonstrates L knee buckle.  This improved with cue to achieve full L knee E prior to attempting L SLS.  However, pt unable to clear foot from floor with either DF or compensatory hip F.   L knee continues to demonstrate partial knee buckle and pt fatigues after 4 attempts.  Other Exercises: Supine Bil DF and PF AROM x10 each LE with instruction to perform throughout the day.  Other Exercises: Pt encouraged to attempt R ankle eversion every hour and if she notices any improvement in strength to complete AROM x10.   Other Exercises: Bil supine knee presses with 3 second hold x10 and instructed pt to continue completing throughout the day.    Assessment/Plan    PT Assessment Patient needs continued PT services  PT Problem List Decreased strength;Decreased range of motion;Decreased activity tolerance;Decreased balance;Decreased mobility;Decreased knowledge of use of DME;Decreased safety awareness;Decreased knowledge of precautions;Impaired sensation;Pain       PT Treatment Interventions DME instruction;Gait training;Stair training;Functional mobility training;Therapeutic activities;Therapeutic exercise;Balance training;Neuromuscular re-education;Patient/family education;Wheelchair mobility training    PT Goals (Current goals can be found in the Care Plan section)  Acute Rehab PT Goals Patient Stated Goal: to be able to walk PT Goal Formulation: With patient Time For Goal Achievement: 07/14/18 Potential to Achieve Goals: Fair    Frequency BID   Barriers to discharge Inaccessible home environment;Decreased caregiver support Steps to enter home and pt alone during the day  Co-evaluation               AM-PAC PT "6 Clicks" Daily Activity  Outcome Measure Difficulty turning over in bed (including adjusting bedclothes, sheets and blankets)?: Unable Difficulty moving from lying on back to sitting on the side of the bed? : Unable Difficulty sitting down on and standing up from a chair with arms (e.g., wheelchair, bedside commode, etc,.)?: Unable Help needed moving to and from a bed to chair (including a wheelchair)?: Total Help needed walking in hospital room?:  Total Help needed climbing 3-5 steps with a railing? : Total 6 Click Score: 6    End of Session Equipment Utilized During Treatment: Gait belt Activity Tolerance: Patient limited by fatigue Patient left: in bed;with call bell/phone within reach;with bed alarm set;with SCD's reapplied Nurse Communication: Mobility status;Other (comment)(continued strength and sensation deficits) PT Visit Diagnosis: Muscle weakness (generalized) (M62.81);Pain;Unsteadiness on feet (R26.81);Difficulty in walking, not elsewhere classified (R26.2) Pain - Right/Left: (lower) Pain - part of body: (back)    Time: 6438-3818 PT Time Calculation (min) (ACUTE ONLY): 31 min   Charges:   PT Evaluation $PT Eval Moderate Complexity: 1 Mod PT Treatments $Therapeutic Exercise: 8-22 mins $Therapeutic Activity: 8-22 mins        Collie Siad PT, DPT 06/30/2018, 4:43 PM

## 2018-06-30 NOTE — Evaluation (Signed)
Occupational Therapy Evaluation Patient Details Name: Danielle Taylor MRN: [id number] DOB: Jul 05, 1946 Today's Date: 06/30/2018    History of Present Illness 72yo female pt POD#1 s/p L4-5 TLIF. PMHx includes lumbar radiculopathy, ashtma, COPD, HTN, CAD, depression, Graves disease, and HLD.   Clinical Impression   Pt seen for OT evaluation this date, POD#1 from L4-5 TLIF. Prior to hospital admission, pt was independent with short distance mobility 2/2 pain, could statically stand for <5 minutes 2/2 shooting back>BLE pain, able to perform ADL and drive independently. Spouse cooks and pt has a cleaning services that comes 2x/mo. No falls in 12 months. However, pt has been having increased difficulty with all aspects of mobility and ADL due to back pain. Pt lives with spouse in a single family home with 4 steps to enter and bilat handrails with spouse able to provide PRN assist/support as needed for pt since he works. Pt reporting 5/10 back pain at start of session; RN in and gave pain meds during session. Neurosx PA removed back drain at start of session. Pt complaining of impaired sensation, worse than baseline bilaterally, distal to knees. Strength WFL bilaterally except decreased dorsiflexion. Pt able to perform bed mobility with min assist. Pt on 1L O2, O2 sats t/o session 91-94%, better when using pursed lip breathing. Pt eager to attempt to stand. Difficulty positioning B feet 2/2 impaired sensation. With instruction in hand/foot placement and use of RW, pt able to perform STS t/f with heavy mod assist and +2 for safety to stand. Once in standing required CGA-Min A.  Pt able to side step EOB with heavy mod assist +2 for safety. In standing, pt incontinent of urine. Pt reports no issues with incontinence at baseline and currently unable to sense when she has to urinate. RN/OT performed bed change and hygiene with max assist while pt was standing. No LOB or buckling noted while standing. Pt able to  return demo log roll technique with min A for BLE mgt to return to bed.   Pt instructed in back precautions with handout provided, self care skills, bed mobility and functional transfer training, AE/DME for bathing, dressing, and toileting needs, and home/routines modifications and falls prevention strategies to maximize safety and functional independence while minimizing falls risk and maintaining precautions. Pt verbalized understanding of all education/training provided. Handout provided to support recall and carry over of learned precautions/techniques for bed mobility, functional transfers, and self care skills. Pt will benefit from continued skilled OT Services to address noted impairments and functional deficits (see below for additional detail) in order to maximize return to PLOF, minimize falls risk, and minimize caregiver burden. Recommend HHOT services pending pt's progress while in the hospital. RN to notify MD regarding pt's incontinence and decreased sensation.     Follow Up Recommendations  Home health OT    Equipment Recommendations  3 in 1 bedside commode;Tub/shower seat;Other (comment)(reacher)    Recommendations for Other Services       Precautions / Restrictions Precautions Precautions: Back;Fall Precaution Booklet Issued: Yes (comment) Precaution Comments: no bending, arching, twisting; no brace needed Restrictions Weight Bearing Restrictions: No      Mobility Bed Mobility Overal bed mobility: Needs Assistance Bed Mobility: Sidelying to Sit;Sit to Sidelying;Rolling Rolling: Min assist Sidelying to sit: Min assist     Sit to sidelying: Min assist General bed mobility comments: pt instructed in log roll technique, able to return demo with min A for BLE mgt in/out of bed  Transfers Overall transfer level: Needs assistance  Equipment used: Conservation officer, nature (2 wheeled) Transfers: Sit to/from Stand;Lateral/Scoot Transfers Sit to Stand: Mod assist;+2 safety/equipment         Lateral/Scoot Transfers: Mod assist;+2 safety/equipment General transfer comment: cues for wider BOS with pt demonstrating difficulty repositioning feet 2/2 impaired sensation, no buckling, cues for hand placement on RW    Balance Overall balance assessment: Needs assistance Sitting-balance support: Single extremity supported;Feet supported Sitting balance-Leahy Scale: Fair     Standing balance support: Bilateral upper extremity supported Standing balance-Leahy Scale: Poor Standing balance comment: requires B hands on RW for support             High level balance activites: Side stepping High Level Balance Comments: mod assist for side stepping EOB           ADL either performed or assessed with clinical judgement   ADL Overall ADL's : Needs assistance/impaired Eating/Feeding: Sitting;Independent   Grooming: Sitting;Independent   Upper Body Bathing: Sitting;Supervision/ safety   Lower Body Bathing: Sitting/lateral leans;Minimal assistance;Moderate assistance   Upper Body Dressing : Sitting;Supervision/safety   Lower Body Dressing: Sit to/from stand;Moderate assistance;Maximal assistance     Toilet Transfer Details (indicate cue type and reason): unable to attempt                 Vision Baseline Vision/History: Wears glasses Wears Glasses: Reading only Patient Visual Report: No change from baseline       Perception     Praxis      Pertinent Vitals/Pain Pain Assessment: 0-10 Pain Score: 5  Pain Location: lumbar spine/site of surgery Pain Descriptors / Indicators: Aching;Burning Pain Intervention(s): Limited activity within patient's tolerance;Monitored during session;Repositioned;Utilized relaxation techniques;RN gave pain meds during session     Hand Dominance Right   Extremity/Trunk Assessment Upper Extremity Assessment Upper Extremity Assessment: Overall WFL for tasks assessed   Lower Extremity Assessment Lower Extremity Assessment:  Defer to PT evaluation(grossly at least 4/5 bilaterally, decreased dorsiflexion bilat 2/2 impaired sensation (distal of knee))   Cervical / Trunk Assessment Cervical / Trunk Assessment: Normal   Communication Communication Communication: No difficulties   Cognition Arousal/Alertness: Awake/alert Behavior During Therapy: WFL for tasks assessed/performed Overall Cognitive Status: Within Functional Limits for tasks assessed                                     General Comments  lumbar drain removed by NP at start of session; O2 sats on 1L 91-94% with improvement when using learned PLB techniques    Exercises Other Exercises Other Exercises: pt instructed in back precautions and how to implement during grooming, bathing, toileting, bed mobility, and dressing; handout provided Other Exercises: pt instructed in falls prevention strategies  Other Exercises: pt instructed in AE/DME to improve safety for toileting, bathing, and LB dressing to maintain precautions   Shoulder Instructions      Home Living Family/patient expects to be discharged to:: Private residence Living Arrangements: Spouse/significant other Available Help at Discharge: Family;Available PRN/intermittently(spouse works as a Cabin crew) Type of Home: [employer] Access: Stairs to enter Technical brewer of Steps: 4 Entrance Stairs-Rails: Can reach both;Left;Right Home Layout: One level     Bathroom Shower/Tub: Occupational psychologist: Standard     Home Equipment: None          Prior Functioning/Environment Level of Independence: Independent        Comments: Pt independent, but ambulation and standing limited 2/2 back  pain (ambulated short distances, could stand for <5 minutes at a time). Indep with ADL, driving. Cleaning services 2x/mo, spouse cooks        OT Problem List: Decreased strength;Impaired balance (sitting and/or standing);Pain;Cardiopulmonary status limiting  activity;Decreased coordination;Decreased knowledge of use of DME or AE;Impaired sensation      OT Treatment/Interventions: Self-care/ADL training;DME and/or AE instruction;Therapeutic activities;Balance training;Therapeutic exercise;Patient/family education    OT Goals(Current goals can be found in the care plan section) Acute Rehab OT Goals Patient Stated Goal: return sensation to BLE and be able to walk OT Goal Formulation: With patient Time For Goal Achievement: 07/14/18 Potential to Achieve Goals: Good ADL Goals Pt Will Perform Lower Body Dressing: with set-up;with adaptive equipment;sit to/from stand;with supervision Pt Will Transfer to Toilet: with min assist;ambulating;bedside commode(BSC over toilet, LRAD for amb) Additional ADL Goal #1: Pt will independently instruct family/caregiver in compression stocking mgt including wear schedule, positioning, and donning/doffing. Additional ADL Goal #2: Pt will verbalize 100% of back precautions and demonstrate how to implement safely during ADL and mobility tasks with no verbal cues.  OT Frequency: Min 2X/week   Barriers to D/C:            Co-evaluation              AM-PAC PT "6 Clicks" Daily Activity     Outcome Measure Help from another person eating meals?: None Help from another person taking care of personal grooming?: None Help from another person toileting, which includes using toliet, bedpan, or urinal?: A Lot Help from another person bathing (including washing, rinsing, drying)?: A Lot Help from another person to put on and taking off regular upper body clothing?: A Little Help from another person to put on and taking off regular lower body clothing?: A Lot 6 Click Score: 17   End of Session Equipment Utilized During Treatment: Gait belt;Oxygen;Rolling walker Nurse Communication: Other (comment)(need for adjusting call bell)  Activity Tolerance: Patient tolerated treatment well Patient left: in bed;with call  bell/phone within reach;with bed alarm set  OT Visit Diagnosis: Other abnormalities of gait and mobility (R26.89);Muscle weakness (generalized) (M62.81);Pain Pain - Right/Left: Right(B, and lumbar back) Pain - part of body: Leg                Time: 5784-6962 OT Time Calculation (min): 51 min Charges:  OT General Charges $OT Visit: 1 Visit OT Evaluation $OT Eval Moderate Complexity: 1 Mod OT Treatments $Self Care/Home Management : 38-52 mins  Jeni Salles, MPH, MS, OTR/L ascom [phone] 06/30/18, 10:45 AM

## 2018-06-30 NOTE — Progress Notes (Addendum)
Procedure: L4-5 TLIF Procedure Date: 06/29/2018 Diagnosis: Lumbar radiculopathy  History: Danielle Taylor is here for L4-5 TLIF for lumbar radiculopathy.  POD1: Complains of numbness in her feet that results in inability to ambulate and stand. Expresses that she had numbness in her feet prior to surgery, but feels that it may be worse especially since she doesn't feel that she can walk because of it.  Also states that yesterday she felt the same in her legs but this has resolved. Denies weakness or pain in lower extremities. Back pain is minimal. Overall pain 5/10. External catheter was placed due to inability to ambulate. Drain output too minimal for recording.   POD0: evaluated while still waking up from anesthesia but able to answer questions and follow most commands.  Pain currently 5/10, most prominent in back. Denies any lower extremity symptoms at this time including pain/numbness/tingling.    Physical Exam: Vitals:   06/30/18 0440 06/30/18 0753  BP: 129/66 139/73  Pulse: 80 77  Resp: 19   Temp: 98.4 F (36.9 C) (!) 97.5 F (36.4 C)  SpO2: 94% 94%    AA Ox3 Strength: 5/5 lower extremities. Strength intact but had some limitations with plantar flexion bilaterally because she was unable to feel if she was pushing.   Sensation: Able to feel touch throughout lower extremities, but has decreased sensation throughout feet.   Data:  Recent Labs  Lab 06/23/18 1409  NA 139  K 4.2  CL 105  CO2 22  BUN 26*  CREATININE 0.74  GLUCOSE 90  CALCIUM 9.5   No results for input(s): AST, ALT, ALKPHOS in the last 168 hours.  Invalid input(s): TBILI   Recent Labs  Lab 06/23/18 1409  WBC 7.1  HGB 14.1  HCT 42.2  PLT 293   Recent Labs  Lab 06/23/18 1409  APTT 40*  INR 1.51         Other tests/results: Lumbar xrays pending  Assessment/Plan:  Danielle Taylor is POD1 s/p L4-5 TLIF. She is recovering well and symptoms that were present prior to surgery seem to  be resolved. Experiencing decreased sensation in feet bilaterally that is preventing her from walking and standing. Will start gabapentin and continue monitoring.   - mobilize - pain control - DVT prophylaxis - PT  Marin Olp PA-C Department of Neurosurgery    06/30/2018 1140 AM I have reviewed the events of the past 24 hours with Danielle Taylor.  She developed intermittent numbness in her feet yesterday, which improved last night.  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  This morning, she had her catheter removed and had some problems with holding her urine, leading to incontinence.  She worked with OT, but could not bear weight.  She can move her legs, but has trouble extending her toes.  She cannot evert her R foot.  On my exam, she has 5/5 strength throughout BUE and IP/KF/KE.  She has 4+-5 in dorsiflexion and plantar flexion.  She has 3-4- EHL on L, and 1-2/5 EHL on R.  Eversion is relatively absent on the R, but maintained on the left. Inversion is maintained  Sensation is present, but diminished in L5 and S1 distributions.    A/P)  Danielle Taylor is POD1 from L4-5 TLIF.  She is having evidence of L5 and S1 dysfunction, but could also be having peroneal nerve dysfunction.  Due to bilaterality, I am more concerned about nerve traction or post-operative hematoma than peripheral  nerve dysfunction.  I re-reviewed my imaging, and do not think a medial breach is likely.    I would like to obtain a CT scan.  If the implants are in appropriate location within the bone, then the diagnosis of exclusion would be traction reaction in her distal lumbar nerve roots.  If hematoma or aberrant implant placement is noted, she may need to return to OR for revision.    I will make her NPO now, and have ordered a stat Lumbar spine CT.  She last ate at breakfast.  I will return to discuss with her after imaging results are available.  Danielle Maw MD

## 2018-07-01 NOTE — Care Management Important Message (Signed)
Important Message  Patient Details  Name: Danielle Taylor MRN: [id number] Date of Birth: [birth year]   Medicare Important Message Given:  Yes    [name] A [name] 07/01/2018, 10:52 AM

## 2018-07-01 NOTE — Progress Notes (Signed)
I have reviewed the MRI with the radiologist.  There is no compressive lesion at the TLIF site.  Plan to continue current management.

## 2018-07-01 NOTE — Progress Notes (Signed)
Occupational Therapy Treatment Patient Details Name: Danielle Taylor MRN: [id number] DOB: 11-Feb-1946 Today's Date: 07/01/2018    History of present illness 72yo female pt s/p L4-5 TLIF. On 11/7 after surgery pt with new onset urinary incontinence and diminished sensation and strength BLEs.  CT scan and MRI completed and pt cleared to continue therapy.  PMHx includes lumbar radiculopathy, ashtma, COPD, HTN, CAD, depression, Graves disease, and HLD.   OT comments  Pt seen for OT tx this date. Pt eager to work with therapist. Pt continues to endorse impaired sensation distal to B knees and involuntary tremors intermittently t/o the day. Pt able to perform bed mobility with log roll technique with Min A for BLE  Mgt. Pt sat EOB to complete grooming tasks. Additional instruction provided in back precautions and how to implement during ADL tasks with or without AE/DME. Pt verbalized understanding and able to verbalize 100% of back precautions and how to implement at end of session. Pt continues to benefit from skilled OT services to maximize return to PLOF with improved pain and functional independence. Discharge plan updated to reflect pt's progress to date. Recommend STR at discharge as pt continues to have significant difficulty with mobility tasks 2/2 Bilat weakness and impaired sensation to BLE.    Follow Up Recommendations  SNF    Equipment Recommendations  3 in 1 bedside commode;Tub/shower seat;Other (comment)(reacher)    Recommendations for Other Services      Precautions / Restrictions Precautions Precautions: Back;Fall Precaution Booklet Issued: (provided by OT) Precaution Comments: pt able to recall 100% of back precautions without further instruction Restrictions Weight Bearing Restrictions: No       Mobility Bed Mobility Overal bed mobility: Needs Assistance Bed Mobility: Rolling;Sidelying to Sit;Sit to Sidelying Rolling: Supervision Sidelying to sit: Min assist      Sit to sidelying: Min assist General bed mobility comments: min A for BLE mgt   Transfers       General transfer comment: deferred 2/2 fatigue    Balance Overall balance assessment: Needs assistance Sitting-balance support: No upper extremity supported;Feet supported Sitting balance-Leahy Scale: Good Sitting balance - Comments: Pt able to sit EOB without UE support                             ADL either performed or assessed with clinical judgement   ADL Overall ADL's : Needs assistance/impaired     Grooming: Sitting;Independent Grooming Details (indicate cue type and reason): pt sat EOB to perform grooming task, review of 2 cup technique to minimize bending when standing at sink to complete                                     Vision Baseline Vision/History: Wears glasses Wears Glasses: Reading only Patient Visual Report: No change from baseline     Perception     Praxis      Cognition Arousal/Alertness: Awake/alert Behavior During Therapy: WFL for tasks assessed/performed Overall Cognitive Status: Within Functional Limits for tasks assessed                                          Exercises Exercises: Other exercises Other Exercises: pt instructed in back precautions and how to implement during ADL and mobility, pt able to  verbalize 100% without cuing   Shoulder Instructions       General Comments pt continues to demonstrate involuntary tremor BLE intermittently t/o session both in supine and sitting EOB    Pertinent Vitals/ Pain       Pain Assessment: 0-10 Pain Score: 5  Faces Pain Scale: Hurts little more Pain Location: lumbar spine/site of surgery with bed mobility Pain Descriptors / Indicators: Aching Pain Intervention(s): Limited activity within patient's tolerance;Monitored during session;Repositioned  Home Living                                          Prior Functioning/Environment               Frequency  Min 2X/week        Progress Toward Goals  OT Goals(current goals can now be found in the care plan section)  Progress towards OT goals: Progressing toward goals  Acute Rehab OT Goals Patient Stated Goal: to be able to walk OT Goal Formulation: With patient Time For Goal Achievement: 07/14/18 Potential to Achieve Goals: Good  Plan Frequency remains appropriate;Discharge plan needs to be updated    Co-evaluation                 AM-PAC PT "6 Clicks" Daily Activity     Outcome Measure   Help from another person eating meals?: None Help from another person taking care of personal grooming?: None Help from another person toileting, which includes using toliet, bedpan, or urinal?: A Lot Help from another person bathing (including washing, rinsing, drying)?: A Lot Help from another person to put on and taking off regular upper body clothing?: None Help from another person to put on and taking off regular lower body clothing?: A Lot 6 Click Score: 18    End of Session    OT Visit Diagnosis: Other abnormalities of gait and mobility (R26.89);Muscle weakness (generalized) (M62.81);Pain Pain - Right/Left: Right(B and lumbar spine) Pain - part of body: Leg   Activity Tolerance Patient tolerated treatment well   Patient Left in bed;with call bell/phone within reach;with bed alarm set;with SCD's reapplied;with family/visitor present   Nurse Communication          Time: 0347-4259 OT Time Calculation (min): 25 min  Charges: OT General Charges $OT Visit: 1 Visit OT Treatments $Self Care/Home Management : 23-37 mins  Jeni Salles, MPH, MS, OTR/L ascom [phone] 07/01/18, 4:29 PM

## 2018-07-01 NOTE — Progress Notes (Signed)
Clinical Education officer, museum (CSW) presented bed offers to patient and she chose [facility]. Plan is for patient to D/C to [facility] over the weekend if medically stable. Per [facility] admissions coordinator at [facility] they can accept patient over the weekend to room E-15. Per Liliane Channel weekend CSW can fax D/C summary to [facility]. CSW contacted patient's husband Jenny Reichmann and made him aware of above. CSW will continue to follow and assist as needed.   [employer], LCSW [phone]

## 2018-07-01 NOTE — Progress Notes (Signed)
Physical Therapy Treatment Patient Details Name: Danielle Taylor MRN: [id number] DOB: 10/11/1945 Today's Date: 07/01/2018    History of Present Illness 72yo female pt POD#1 s/p L4-5 TLIF. On 11/7 after surgery pt with new onset urinary incontinence and diminished sensation and strength BLEs.  CT scan ordered and following results, Dr. Izora Ribas requested PT to complete evaluation.  MRI completed and again pt cleared to continue therapy.  PMHx includes lumbar radiculopathy, ashtma, COPD, HTN, CAD, depression, Graves disease, and HLD.    PT Comments    Danielle Taylor made good progress toward therapy goals.  She was able to perform SLS with compensatory strategies for marching in standing and to perform stand pivot transfer with assist.  SNF remains most appropriate d/c plan at this time.     Follow Up Recommendations  SNF     Equipment Recommendations  Rolling walker with 5" wheels    Recommendations for Other Services       Precautions / Restrictions Precautions Precautions: Back;Fall Precaution Booklet Issued: (provided by OT) Precaution Comments: Pt recalled no bending, twisting.  Reminded of no arching.  Per verbal from Dr. Izora Ribas, no brace needed. Restrictions Weight Bearing Restrictions: No    Mobility  Bed Mobility Overal bed mobility: Needs Assistance Bed Mobility: Sidelying to Sit;Rolling Rolling: Min guard Sidelying to sit: Min assist       General bed mobility comments: Pt does not require assist to advance LEs to EOB this session.  Assist to elevate trunk from sidelying to sitting EOB.   Transfers Overall transfer level: Needs assistance Equipment used: Rolling walker (2 wheeled) Transfers: Sit to/from [facility] Sit to Stand: Min assist Stand pivot transfers: Min assist;+2 physical assistance;+2 safety/equipment       General transfer comment: Min assist to boost to standing and to remain steady.  Cues for proper hand placement  and safe technique. Assist for descent to sit.  To pivot pt instructed in achieving full L knee E prior to stepping with RLE and full R knee E for stepping with LLE.  Pt instructed to look down at feel due to decreased proprioception and no knowledge of foot placement.  Pt requires assist to remain steady and to manage RW.   Ambulation/Gait             General Gait Details: Unable at this time.    Stairs             Wheelchair Mobility    Modified Rankin (Stroke Patients Only)       Balance Overall balance assessment: Needs assistance Sitting-balance support: No upper extremity supported;Feet supported Sitting balance-Leahy Scale: Good Sitting balance - Comments: Pt able to sit EOB without UE support   Standing balance support: Bilateral upper extremity supported;During functional activity Standing balance-Leahy Scale: Poor Standing balance comment: requires B hands on RW for support                            Cognition Arousal/Alertness: Awake/alert Behavior During Therapy: WFL for tasks assessed/performed Overall Cognitive Status: Within Functional Limits for tasks assessed                                        Exercises Other Exercises Other Exercises: Marching in standing with cues for full knee E prior to stepping with opposite foot and to look down due  to decreased proprioception.      General Comments General comments (skin integrity, edema, etc.): Pt demonstrates involuntary tremor BLE in bed at start of session prior to mobility.      Pertinent Vitals/Pain Pain Assessment: Faces Faces Pain Scale: Hurts little more Pain Location: lumbar spine/site of surgery with sidelying>sit Pain Descriptors / Indicators: Aching;Grimacing Pain Intervention(s): Limited activity within patient's tolerance;Monitored during session;Repositioned;Utilized relaxation techniques    Home Living                      Prior Function             PT Goals (current goals can now be found in the care plan section) Acute Rehab PT Goals Patient Stated Goal: to be able to walk PT Goal Formulation: With patient Time For Goal Achievement: 07/14/18 Potential to Achieve Goals: Fair Progress towards PT goals: Progressing toward goals    Frequency    BID      PT Plan Current plan remains appropriate    Co-evaluation              AM-PAC PT "6 Clicks" Daily Activity  Outcome Measure  Difficulty turning over in bed (including adjusting bedclothes, sheets and blankets)?: Unable Difficulty moving from lying on back to sitting on the side of the bed? : Unable Difficulty sitting down on and standing up from a chair with arms (e.g., wheelchair, bedside commode, etc,.)?: Unable Help needed moving to and from a bed to chair (including a wheelchair)?: A Lot Help needed walking in hospital room?: Total Help needed climbing 3-5 steps with a railing? : Total 6 Click Score: 7    End of Session Equipment Utilized During Treatment: Gait belt Activity Tolerance: Patient limited by fatigue Patient left: with call bell/phone within reach;in chair;with chair alarm set;with SCD's reapplied Nurse Communication: Mobility status PT Visit Diagnosis: Muscle weakness (generalized) (M62.81);Pain;Unsteadiness on feet (R26.81);Difficulty in walking, not elsewhere classified (R26.2) Pain - Right/Left: (lower) Pain - part of body: (back)     Time: 5374-8270 PT Time Calculation (min) (ACUTE ONLY): 27 min  Charges:  $Therapeutic Activity: 23-37 mins                     Collie Siad PT, DPT 07/01/2018, 12:50 PM

## 2018-07-01 NOTE — Progress Notes (Addendum)
Procedure: L4-5 TLIF Procedure Date: 06/29/2018 Diagnosis: Lumbar radiculopathy  History: Danielle Taylor is here for L4-5 TLIF for lumbar radiculopathy.  POD2: Overall, she feels symptoms are improving.  She is slowly regaining strength and feet and numbness seems to be improving.   However, she does show concern for leg tremors that have presented following surgery. On further evaluation of groin/buttock numbness, she states this is primarily in the bilateral buttocks but she still has sensation in genitals and rectum.  Continues to work with PT and mobility is very gradually improving.  Back pain still rated 5/10.  Due to presentation of neurological deficits postoperatively, patient completed CT of the lumbar spine as well as MRI of the lumbar spine.  Dr. Cari Caraway had reviewed these and imaging does not reveal that any additional surgical intervention is recommended at this time.  She was started on high-dose steroids and gabapentin, with continued physical therapy.  External catheter replaced with Foley.  POD1: Complains of numbness in her feet that results in inability to ambulate and stand. Expresses that she had numbness in her feet prior to surgery, but feels that it may be worse especially since she doesn't feel that she can walk because of it.  Also states that yesterday she felt the same in her legs but this has resolved. Denies weakness or pain in lower extremities. Back pain is minimal. Overall pain 5/10. External catheter was placed due to inability to ambulate. Drain output too minimal for recording.   POD0: evaluated while still waking up from anesthesia but able to answer questions and follow most commands.  Pain currently 5/10, most prominent in back. Denies any lower extremity symptoms at this time including pain/numbness/tingling.    Physical Exam: Vitals:   07/01/18 1137 07/01/18 1602  BP: (!) 147/78 (!) 134/58  Pulse: 80 80  Resp: 16   Temp: 97.9 F (36.6 C) (!)  97.4 F (36.3 C)  SpO2: 94% 94%    AA Ox3 Strength: Left lower extremity: 5 out of 5 iliopsoas, hamstring, quad, plantarflexion, dorsiflexion.  4/5 EHL.  Inversion and eversion intact. Right lower extremity: 5/5 iliopsoas, hamstring, quad, plantarflexion, dorsiflexion.  Minimal but independent movement of great toe as well as minimal movement with eversion.  Foot appears in state of inversion. Sensation: Intact and symmetric through lower extremity thighs and calves.  Decreased sensation right lateral foot and at the toes.  Data:  No results for input(s): NA, K, CL, CO2, BUN, CREATININE, LABGLOM, GLUCOSE, CALCIUM in the last 168 hours. No results for input(s): AST, ALT, ALKPHOS in the last 168 hours.  Invalid input(s): TBILI   No results for input(s): WBC, HGB, HCT, PLT in the last 168 hours. No results for input(s): APTT, INR in the last 168 hours.       Other tests/results:  EXAM: LUMBAR SPINE - 2-3 VIEW; DG C-ARM 61-120 MIN  COMPARISON:  Lumbar MRI 06/02/2018.  FINDINGS: Fifteen intraoperative fluoroscopic spot views of the lower lumbar spine demonstrate L4-L5 level posterior and interbody fusion hardware placement, when using the same numbering system as on the comparison MRI.  FLUOROSCOPY TIME:  1 minutes 43 seconds  IMPRESSION: L4-L5 posterior and interbody fusion.  EXAM: CT LUMBAR SPINE WITHOUT CONTRAST  TECHNIQUE: Multidetector CT imaging of the lumbar spine was performed without intravenous contrast administration. Multiplanar CT image reconstructions were also generated.  COMPARISON:  MRI lumbar spine 06/02/2018.  FINDINGS: Segmentation: 5 lumbar type vertebrae.  Alignment: 3 mm anterolisthesis L4-5. Trace retrolisthesis at L1-2, L2-3,  and L3-4 compensatory.  Vertebrae: Recent L4-5 TLIF, described below.  Paraspinal and other soft tissues: No extra-spinal hematoma or postoperative fluid collection at the surgical site.  Aortic atherosclerosis.  Disc levels:  L1-L2: Chronic disc space narrowing, borderline stenosis. No impingement.  L2-L3: Chronic disc space narrowing. Posterior element hypertrophy. Osseous spurring. Moderate stenosis. No acute findings.  L3-L4: Disc space narrowing. Posterior element hypertrophy. Osseous spurring. Moderate stenosis. No acute findings.  L4-L5: Status post RIGHT L4-5 partial facetectomy and laminectomy for TLIF. Interbody cage appropriately centered, within the interspace. Slight reduction of degenerative spondylolisthesis, now 3 mm compared to 4 mm before surgery. BILATERAL L4 and BILATERAL L5 pedicle screws demonstrate no aberrant placement or medialization. Assessment for canal hematoma is limited by Hounsfield artifact. Facet arthropathy and ligamentum flavum calcification, greater on the LEFT. BILATERAL foraminal narrowing appears stable.  L5-S1: Chronic disc space narrowing. Osseous spurring to the RIGHT. Functional fusion across this interspace. No definite impingement.  IMPRESSION: Status post L4-5 TLIF, with hardware/pedicle screws appropriately placed and interbody cage centered within the interspace. Slight reduction in degenerative spondylolisthesis, to 3 mm.  Assessment for canal hematoma is limited by Hounsfield artifact. No extra-spinal hematoma or postoperative fluid collection is observed.  EXAM: MRI LUMBAR SPINE WITHOUT CONTRAST  TECHNIQUE: Multiplanar, multisequence MR imaging of the lumbar spine was performed. No intravenous contrast was administered.  COMPARISON:  CT of the lumbar spine 06/30/2018. MRI of the lumbar spine 06/02/2018.  FINDINGS: Segmentation: 5 non rib-bearing lumbar type vertebral bodies are present. The lowest fully formed vertebral body is L5  Alignment: Slight anterolisthesis at L4-5 is fused. Minimal retrolisthesis is again noted at L2-3 and L3-4.  Vertebrae: Schmorl's nodes are present at L2-3  and L3-4. There is scattered fatty infiltration of the thoracolumbar spine. Artifact related to hardware is present at L4 and L5.  Conus medullaris and cauda equina: Conus extends to the L1 level. Conus and cauda equina appear normal.  Paraspinal and other soft tissues: Limited imaging of the abdomen is unremarkable. There is no significant adenopathy. No solid organ lesions are present.  Disc levels:  T12-L1: Negative.  L1-2: A mild broad-based disc protrusion is present. There is prominent epidural fat. This crowds the nerve centrally without focal compression.  L2-3: A broad-based disc protrusion is again seen. Prominent epidural fat is noted. There is crowding of the nerve roots centrally with mild left subarticular stenosis. Mild left foraminal narrowing is also due to disc disease.  L3-4: There is chronic loss of disc height. A broad-based disc protrusion is present. Moderate facet hypertrophy is noted. Prominent epidural fat crowds the nerve roots. Mild left subarticular narrowing is present. There is mild foraminal stenosis bilaterally without significant interval change.  L4-5: Right laminectomy is present. There is soft tissue in the posterior epidural space on the right. The thecal sac and nerve roots are displaced posteriorly by what appears to be displaced residual disc material posterior to the spacer. A moderate effusion is present in the left facet joint. Mild left foraminal narrowing is present.  L5-S1: There is fusion anteriorly across the disc space. The foramina are widely patent bilaterally. Prominent epidural fat is noted.  IMPRESSION: 1. Postsurgical changes at L4-5 include right laminectomy. 2. Disc spacer at L4-5 stable. 3. Soft tissue posterior to the disc spacer slightly displaces the thecal sac posteriorly. There is no compressive lesion posterior to the thecal sac. This may create slight deviation of the nerve roots at this level.  This may represent some residual disc  material posterior to the spacer. There is no hematoma or compressive lesion on the thecal sac. 4. Foramina are patent bilaterally at L4-5. 5. Stable disc disease at L2-3 and L3-4. 6. Extensive epidural lipomatosis.  Assessment/Plan:  Danielle Taylor is POD2 s/p L4-5 TLIF.  That were present prior to surgery have resolved but she experienced postop numbness and decreased mobility and feet.  Numbness seems to be gradually improving as well as strength and mobility in feet.  Continues to work with physical therapy with significant benefit.  Cari Caraway has reviewed postop CT and MRI which did not show any evidence that additional surgical intervention is warranted.  Spoke to him in regards to lower extremity tremors that started postsurgically and he has recommended neurology consult for further evaluation. Will continue to monitor progress. Physical therapy has recommended skilled nursing facility placement.  Social worker has arranged placement at [facility] if still necessary and recommended.   - mobilize - pain control - DVT prophylaxis - PT  Marin Olp PA-C Department of Neurosurgery

## 2018-07-01 NOTE — Progress Notes (Signed)
Physical Therapy Treatment Patient Details Name: Danielle Taylor MRN: [id number] DOB: 1945-09-13 Today's Date: 07/01/2018    History of Present Illness 72yo female pt POD#1 s/p L4-5 TLIF. On 11/7 after surgery pt with new onset urinary incontinence and diminished sensation and strength BLEs.  CT scan ordered and following results, Dr. Izora Ribas requested PT to complete evaluation.  MRI completed and again pt cleared to continue therapy.  PMHx includes lumbar radiculopathy, ashtma, COPD, HTN, CAD, depression, Graves disease, and HLD.    PT Comments    Ms. Avants continues to put forth good effort with therapy and is making improvements toward therapy goals.  She was able to ambulate a short distance in her room (~8 ft) with RW and assist with compensatory behaviors.  Pt fatigues quickly with ambulation.  SNF remains most appropriate d/c plan at this time.     Follow Up Recommendations  SNF     Equipment Recommendations  Rolling walker with 5" wheels    Recommendations for Other Services       Precautions / Restrictions Precautions Precautions: Back;Fall Precaution Booklet Issued: (provided by OT) Precaution Comments: Pt recalled no bending, lifting, twisting.  Reminded of no arching.  Per verbal from Dr. Izora Ribas, no brace needed. Restrictions Weight Bearing Restrictions: No    Mobility  Bed Mobility Overal bed mobility: Needs Assistance Bed Mobility: Rolling;Sit to Sidelying Rolling: Min guard Sidelying to sit: Min assist     Sit to sidelying: Min assist General bed mobility comments: Assist to bring BLEs into bed for sit>sidelying.    Transfers Overall transfer level: Needs assistance Equipment used: Rolling walker (2 wheeled) Transfers: Sit to/from Stand Sit to Stand: Min assist Stand pivot transfers: Min assist;+2 physical assistance;+2 safety/equipment       General transfer comment: Min assist to boost to standing and to remain steady.  Pt recalls  proper hand placement without cues this session for sit<>stand.   Ambulation/Gait Ambulation/Gait assistance: Min assist;+2 physical assistance;+2 safety/equipment Gait Distance (Feet): 8 Feet Assistive device: Rolling walker (2 wheeled) Gait Pattern/deviations: Decreased step length - right;Decreased step length - left;Antalgic;Trunk flexed Gait velocity: decreased Gait velocity interpretation: <1.31 ft/sec, indicative of household ambulator General Gait Details: Pt performs knee E before stepping with opposite LE.  Partial knee buckle Bil requiring assist to remain steady.  Assist for positioning of RW.     Stairs             Wheelchair Mobility    Modified Rankin (Stroke Patients Only)       Balance Overall balance assessment: Needs assistance Sitting-balance support: No upper extremity supported;Feet supported Sitting balance-Leahy Scale: Good Sitting balance - Comments: Pt able to sit EOB without UE support   Standing balance support: Bilateral upper extremity supported;During functional activity Standing balance-Leahy Scale: Poor Standing balance comment: requires B hands on RW for support                            Cognition Arousal/Alertness: Awake/alert Behavior During Therapy: WFL for tasks assessed/performed Overall Cognitive Status: Within Functional Limits for tasks assessed                                        Exercises Other Exercises Other Exercises: Marching in standing with cues for full knee E prior to stepping with opposite foot and to look down due to  decreased proprioception.      General Comments General comments (skin integrity, edema, etc.): Pt demonstrates involuntary tremor BLE sitting in chair at start of session prior to mobility.      Pertinent Vitals/Pain Pain Assessment: Faces Faces Pain Scale: Hurts little more Pain Location: lumbar spine/site of surgery with sit>sidelying Pain Descriptors /  Indicators: Aching;Grimacing Pain Intervention(s): Limited activity within patient's tolerance;Monitored during session;Repositioned;Utilized relaxation techniques    Home Living                      Prior Function            PT Goals (current goals can now be found in the care plan section) Acute Rehab PT Goals Patient Stated Goal: to be able to walk PT Goal Formulation: With patient Time For Goal Achievement: 07/14/18 Potential to Achieve Goals: Fair Progress towards PT goals: Progressing toward goals    Frequency    BID      PT Plan Current plan remains appropriate    Co-evaluation              AM-PAC PT "6 Clicks" Daily Activity  Outcome Measure  Difficulty turning over in bed (including adjusting bedclothes, sheets and blankets)?: Unable Difficulty moving from lying on back to sitting on the side of the bed? : Unable Difficulty sitting down on and standing up from a chair with arms (e.g., wheelchair, bedside commode, etc,.)?: Unable Help needed moving to and from a bed to chair (including a wheelchair)?: A Lot Help needed walking in hospital room?: A Lot Help needed climbing 3-5 steps with a railing? : Total 6 Click Score: 8    End of Session Equipment Utilized During Treatment: Gait belt Activity Tolerance: Patient limited by fatigue Patient left: with call bell/phone within reach;with SCD's reapplied;with bed alarm set;in bed Nurse Communication: Mobility status PT Visit Diagnosis: Muscle weakness (generalized) (M62.81);Pain;Unsteadiness on feet (R26.81);Difficulty in walking, not elsewhere classified (R26.2) Pain - Right/Left: (lower) Pain - part of body: (back)     Time: 0100-7121 PT Time Calculation (min) (ACUTE ONLY): 22 min  Charges:  $Gait Training: 8-22 mins $Therapeutic Activity: 23-37 mins                     Collie Siad PT, DPT 07/01/2018, 2:57 PM

## 2018-07-02 DIAGNOSIS — Z981 Arthrodesis status: Secondary | ICD-10-CM

## 2018-07-02 MED ORDER — DEXAMETHASONE 4 MG PO TABS
2.0000 mg | ORAL_TABLET | Freq: Two times a day (BID) | ORAL | Status: AC
  Administered 2018-07-04 – 2018-07-06 (×4): 2 mg via ORAL
  Filled 2018-07-02 (×4): qty 0.5

## 2018-07-02 MED ORDER — DEXAMETHASONE 4 MG PO TABS
2.0000 mg | ORAL_TABLET | Freq: Four times a day (QID) | ORAL | Status: AC
  Administered 2018-07-02 – 2018-07-04 (×8): 2 mg via ORAL
  Filled 2018-07-02 (×8): qty 0.5

## 2018-07-02 MED ORDER — DEXAMETHASONE 0.5 MG PO TABS: 1.0000 mg | ORAL_TABLET | Freq: Every day | ORAL | Status: DC

## 2018-07-02 MED ORDER — DEXAMETHASONE 4 MG PO TABS
2.0000 mg | ORAL_TABLET | Freq: Every day | ORAL | Status: AC
  Administered 2018-07-07: 2 mg via ORAL
  Filled 2018-07-02: qty 0.5

## 2018-07-02 MED ORDER — ENOXAPARIN SODIUM 40 MG/0.4ML ~~LOC~~ SOLN
40.0000 mg | Freq: Every day | SUBCUTANEOUS | Status: DC
  Administered 2018-07-02 – 2018-07-06 (×5): 40 mg via SUBCUTANEOUS
  Filled 2018-07-02 (×5): qty 0.4

## 2018-07-02 NOTE — Progress Notes (Addendum)
Procedure: L4-5 TLIF Procedure Date: 06/29/2018 Diagnosis: Lumbar radiculopathy  History: Danielle Taylor is here for L4-5 TLIF for lumbar radiculopathy.  POD3: Danielle Taylor had some improvement of symptoms yesterday.  She has had slight improvement in her L foot function, which is nearly normal now.  She is able to dorsiflex on the R, but still have eversion weakness.  Her lumbar radiculopathy from preop is improved.  POD2: Overall, she feels symptoms are improving.  She is slowly regaining strength and feet and numbness seems to be improving.   However, she does show concern for leg tremors that have presented following surgery. On further evaluation of groin/buttock numbness, she states this is primarily in the bilateral buttocks but she still has sensation in genitals and rectum.  Continues to work with PT and mobility is very gradually improving.  Back pain still rated 5/10.  Due to presentation of neurological deficits postoperatively, patient completed CT of the lumbar spine as well as MRI of the lumbar spine.  Dr. Cari Caraway had reviewed these and imaging does not reveal that any additional surgical intervention is recommended at this time.  She was started on high-dose steroids and gabapentin, with continued physical therapy.  External catheter replaced with Foley.  POD1: Complains of numbness in her feet that results in inability to ambulate and stand. Expresses that she had numbness in her feet prior to surgery, but feels that it may be worse especially since she doesn't feel that she can walk because of it.  Also states that yesterday she felt the same in her legs but this has resolved. Denies weakness or pain in lower extremities. Back pain is minimal. Overall pain 5/10. External catheter was placed due to inability to ambulate. Drain output too minimal for recording.   POD0: evaluated while still waking up from anesthesia but able to answer questions and follow most commands.   Pain currently 5/10, most prominent in back. Denies any lower extremity symptoms at this time including pain/numbness/tingling.    Physical Exam: Vitals:   07/02/18 0018 07/02/18 0804  BP: (!) 146/86 (!) 147/78  Pulse: 79 80  Resp: 14   Temp: 98.3 F (36.8 C) (!) 97.5 F (36.4 C)  SpO2: 92% 92%    AA Ox3 Strength: Left lower extremity: 5 out of 5 iliopsoas, hamstring, quad, plantarflexion, dorsiflexion.  4+/5 EHL.  Inversion and eversion intact. Right lower extremity: 5/5 iliopsoas, hamstring, quad, plantarflexion, dorsiflexion.  Minimal but independent movement of great toe as well as minimal movement with eversion.  Foot appears in state of inversion.  Inversion 5/5, dorsiflexion 4/5.  Eversion 1/5, EHL 1-2/5 Sensation: Intact and symmetric through lower extremity thighs and calves.  Decreased sensation right lateral foot and at the toes.  Incision c/d/i  Data:  No results for input(s): NA, K, CL, CO2, BUN, CREATININE, LABGLOM, GLUCOSE, CALCIUM in the last 168 hours. No results for input(s): AST, ALT, ALKPHOS in the last 168 hours.  Invalid input(s): TBILI   No results for input(s): WBC, HGB, HCT, PLT in the last 168 hours. No results for input(s): APTT, INR in the last 168 hours.       Other tests/results:  EXAM: LUMBAR SPINE - 2-3 VIEW; DG C-ARM 61-120 MIN  COMPARISON:  Lumbar MRI 06/02/2018.  FINDINGS: Fifteen intraoperative fluoroscopic spot views of the lower lumbar spine demonstrate L4-L5 level posterior and interbody fusion hardware placement, when using the same numbering system as on the comparison MRI.  FLUOROSCOPY TIME:  1 minutes 43 seconds  IMPRESSION: L4-L5 posterior and interbody fusion.  EXAM: CT LUMBAR SPINE WITHOUT CONTRAST  TECHNIQUE: Multidetector CT imaging of the lumbar spine was performed without intravenous contrast administration. Multiplanar CT image reconstructions were also generated.  COMPARISON:  MRI lumbar spine  06/02/2018.  FINDINGS: Segmentation: 5 lumbar type vertebrae.  Alignment: 3 mm anterolisthesis L4-5. Trace retrolisthesis at L1-2, L2-3, and L3-4 compensatory.  Vertebrae: Recent L4-5 TLIF, described below.  Paraspinal and other soft tissues: No extra-spinal hematoma or postoperative fluid collection at the surgical site. Aortic atherosclerosis.  Disc levels:  L1-L2: Chronic disc space narrowing, borderline stenosis. No impingement.  L2-L3: Chronic disc space narrowing. Posterior element hypertrophy. Osseous spurring. Moderate stenosis. No acute findings.  L3-L4: Disc space narrowing. Posterior element hypertrophy. Osseous spurring. Moderate stenosis. No acute findings.  L4-L5: Status post RIGHT L4-5 partial facetectomy and laminectomy for TLIF. Interbody cage appropriately centered, within the interspace. Slight reduction of degenerative spondylolisthesis, now 3 mm compared to 4 mm before surgery. BILATERAL L4 and BILATERAL L5 pedicle screws demonstrate no aberrant placement or medialization. Assessment for canal hematoma is limited by Hounsfield artifact. Facet arthropathy and ligamentum flavum calcification, greater on the LEFT. BILATERAL foraminal narrowing appears stable.  L5-S1: Chronic disc space narrowing. Osseous spurring to the RIGHT. Functional fusion across this interspace. No definite impingement.  IMPRESSION: Status post L4-5 TLIF, with hardware/pedicle screws appropriately placed and interbody cage centered within the interspace. Slight reduction in degenerative spondylolisthesis, to 3 mm.  Assessment for canal hematoma is limited by Hounsfield artifact. No extra-spinal hematoma or postoperative fluid collection is observed.  EXAM: MRI LUMBAR SPINE WITHOUT CONTRAST  TECHNIQUE: Multiplanar, multisequence MR imaging of the lumbar spine was performed. No intravenous contrast was administered.  COMPARISON:  CT of the lumbar spine  06/30/2018. MRI of the lumbar spine 06/02/2018.  FINDINGS: Segmentation: 5 non rib-bearing lumbar type vertebral bodies are present. The lowest fully formed vertebral body is L5  Alignment: Slight anterolisthesis at L4-5 is fused. Minimal retrolisthesis is again noted at L2-3 and L3-4.  Vertebrae: Schmorl's nodes are present at L2-3 and L3-4. There is scattered fatty infiltration of the thoracolumbar spine. Artifact related to hardware is present at L4 and L5.  Conus medullaris and cauda equina: Conus extends to the L1 level. Conus and cauda equina appear normal.  Paraspinal and other soft tissues: Limited imaging of the abdomen is unremarkable. There is no significant adenopathy. No solid organ lesions are present.  Disc levels:  T12-L1: Negative.  L1-2: A mild broad-based disc protrusion is present. There is prominent epidural fat. This crowds the nerve centrally without focal compression.  L2-3: A broad-based disc protrusion is again seen. Prominent epidural fat is noted. There is crowding of the nerve roots centrally with mild left subarticular stenosis. Mild left foraminal narrowing is also due to disc disease.  L3-4: There is chronic loss of disc height. A broad-based disc protrusion is present. Moderate facet hypertrophy is noted. Prominent epidural fat crowds the nerve roots. Mild left subarticular narrowing is present. There is mild foraminal stenosis bilaterally without significant interval change.  L4-5: Right laminectomy is present. There is soft tissue in the posterior epidural space on the right. The thecal sac and nerve roots are displaced posteriorly by what appears to be displaced residual disc material posterior to the spacer. A moderate effusion is present in the left facet joint. Mild left foraminal narrowing is present.  L5-S1: There is fusion anteriorly across the disc space. The foramina are widely patent bilaterally. Prominent  epidural fat  is noted.  IMPRESSION: 1. Postsurgical changes at L4-5 include right laminectomy. 2. Disc spacer at L4-5 stable. 3. Soft tissue posterior to the disc spacer slightly displaces the thecal sac posteriorly. There is no compressive lesion posterior to the thecal sac. This may create slight deviation of the nerve roots at this level. This may represent some residual disc material posterior to the spacer. There is no hematoma or compressive lesion on the thecal sac. 4. Foramina are patent bilaterally at L4-5. 5. Stable disc disease at L2-3 and L3-4. 6. Extensive epidural lipomatosis.  Assessment/Plan:  Danielle Taylor is POD3 s/p L4-5 TLIF.  Her RLE symptoms prior to surgery are improved.  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  This has slightly improved over the past 2 days.  - wean dexamethasone - HLIV - mobilize - pain control - DVT prophylaxis - PT  [name] MD Department of Neurosurgery

## 2018-07-02 NOTE — Progress Notes (Signed)
Physical Therapy Treatment Patient Details Name: Danielle Taylor MRN: [id number] DOB: 1946/04/05 Today's Date: 07/02/2018    History of Present Illness 33/yo female pt s/p L4-5 TLIF. On 11/7 after surgery pt with new onset urinary incontinence and diminished sensation and strength BLEs.  CT scan and MRI completed and pt cleared to continue therapy.  PMHx includes lumbar radiculopathy, ashtma, COPD, HTN, CAD, depression, Graves disease, and HLD.    PT Comments    Pt agreeable to PT; reports back pain 5/10. Continues with R > L LE to feet numbness although improving more proximal. PT improving with mobility overall allowing for decreased assist to 1 for transfers and bed to chair. Pt mildly impulsive; encouraged slowed increased attention to B feet exercises as well as use in functional mobility especially R foot in stand. Pt with quick muscular fatigue in RLE and back with stand/ambulation. Continue in afternoon session to improve strength and endurance to improve functional mobility safely.    Follow Up Recommendations  SNF     Equipment Recommendations  Rolling walker with 5" wheels    Recommendations for Other Services       Precautions / Restrictions Precautions Precautions: Back;Fall Restrictions Weight Bearing Restrictions: No    Mobility  Bed Mobility Overal bed mobility: Needs Assistance Bed Mobility: Rolling;Sidelying to Sit;Supine to Sit Rolling: Supervision Sidelying to sit: Min assist       General bed mobility comments: cues given to slow movement and focus on 1 motion at a time: scooting hips to edge, roll then rise. MIldly impulsive  Transfers Overall transfer level: Needs assistance Equipment used: Rolling walker (2 wheeled) Transfers: Sit to/from Stand Sit to Stand: Min assist;From elevated surface         General transfer comment: Cues for visual focus on feet and on R foot flat on floor (R ankle rolls into inversion and plantar foot  contracted)  Ambulation/Gait Ambulation/Gait assistance: Min assist Gait Distance (Feet): 4 Feet(performed twice) Assistive device: Rolling walker (2 wheeled) Gait Pattern/deviations: Step-to pattern;Decreased stride length(weakness in RLE and poor endurance in back)     General Gait Details: Poor endurance back and RLE. Increased focus cues to RLE required   Stairs             Wheelchair Mobility    Modified Rankin (Stroke Patients Only)       Balance Overall balance assessment: Needs assistance Sitting-balance support: Bilateral upper extremity supported;Feet supported Sitting balance-Leahy Scale: Good     Standing balance support: Bilateral upper extremity supported;During functional activity Standing balance-Leahy Scale: Fair                              Cognition Arousal/Alertness: Awake/alert Behavior During Therapy: WFL for tasks assessed/performed Overall Cognitive Status: Within Functional Limits for tasks assessed                                        Exercises General Exercises - Lower Extremity Ankle Circles/Pumps: AROM;Both;15 reps Other Exercises Other Exercises: AROM with cues for slower and increased range inver/ever, cw/ccw circles    General Comments        Pertinent Vitals/Pain Pain Assessment: 0-10 Pain Score: 5  Pain Location: lumbar spine/site of surgery with bed mobility and stand(also numbness R > L foot; mild in LEs more proximal) Pain Descriptors / Indicators: Constant;Aching Pain Intervention(s):  Monitored during session;Repositioned    Home Living                      Prior Function            PT Goals (current goals can now be found in the care plan section) Progress towards PT goals: Progressing toward goals    Frequency    BID      PT Plan Current plan remains appropriate    Co-evaluation              AM-PAC PT "6 Clicks" Daily Activity  Outcome Measure   Difficulty turning over in bed (including adjusting bedclothes, sheets and blankets)?: A Little Difficulty moving from lying on back to sitting on the side of the bed? : Unable Difficulty sitting down on and standing up from a chair with arms (e.g., wheelchair, bedside commode, etc,.)?: Unable Help needed moving to and from a bed to chair (including a wheelchair)?: A Little Help needed walking in hospital room?: A Lot Help needed climbing 3-5 steps with a railing? : Total 6 Click Score: 11    End of Session Equipment Utilized During Treatment: Gait belt Activity Tolerance: Patient limited by fatigue Patient left: in chair;with call bell/phone within reach;with chair alarm set;with family/visitor present(declines alarm; will call for assist)   PT Visit Diagnosis: Muscle weakness (generalized) (M62.81);Pain;Unsteadiness on feet (R26.81);Difficulty in walking, not elsewhere classified (R26.2)     Time: 1100-1118 PT Time Calculation (min) (ACUTE ONLY): 18 min  Charges:  $Gait Training: 8-22 mins                      Larae Grooms, PTA 07/02/2018, 11:57 AM

## 2018-07-02 NOTE — Progress Notes (Signed)
Physical Therapy Treatment Patient Details Name: Danielle Taylor MRN: [id number] DOB: [birth year] Today's Date: 07/02/2018    History of Present Illness 42/yo female pt s/p L4-5 TLIF. On 11/7 after surgery pt with new onset urinary incontinence and diminished sensation and strength BLEs.  CT scan and MRI completed and pt cleared to continue therapy.  PMHx includes lumbar radiculopathy, ashtma, COPD, HTN, CAD, depression, Graves disease, and HLD.    PT Comments    Pt agreeable to PT; back pain 4/10. Pt fatigued. STS demonstrates improvement this session especially with regards to RLE and R foot placement; with focus able to maintain R foot flat on floor. Ambulation chair to bed with continued poor endurance through RLE and back with need to sit after short distance. Declines further attempts. Participates in LE exercise as well as basic core stabilization. Pt unable to perform R foot eversion. Encouraged basic abdominal stabilization throughout day and use with functional mobility; pt understands. Continue PT to progress strength and endurance to improve functional mobility safely.    Follow Up Recommendations  SNF     Equipment Recommendations  Rolling walker with 5" wheels    Recommendations for Other Services       Precautions / Restrictions Precautions Precautions: Back;Fall Restrictions Weight Bearing Restrictions: No    Mobility  Bed Mobility Overal bed mobility: Needs Assistance Bed Mobility: Rolling;Sit to Sidelying Rolling: Supervision Sidelying to sit: Min assist     Sit to sidelying: Min assist General bed mobility comments: For LEs  Transfers Overall transfer level: Needs assistance Equipment used: Rolling walker (2 wheeled) Transfers: Sit to/from Stand Sit to Stand: Min assist         General transfer comment: Improved this session with ability to rise and attain full stand as well as improved R foot position with  focus  Ambulation/Gait Ambulation/Gait assistance: Min assist Gait Distance (Feet): 4 Feet Assistive device: Rolling walker (2 wheeled) Gait Pattern/deviations: Step-to pattern;Decreased stride length(weakness in RLE and poor endurance in back)     General Gait Details: Overall improved R foot placement and position (all 4 corners on floor), pt does not it takes a lot of focus. Declined further attempts at ambulation due to fatigue/weakness especially in back.    Stairs             Wheelchair Mobility    Modified Rankin (Stroke Patients Only)       Balance Overall balance assessment: Needs assistance Sitting-balance support: Bilateral upper extremity supported;Feet supported Sitting balance-Leahy Scale: Good     Standing balance support: Bilateral upper extremity supported;During functional activity Standing balance-Leahy Scale: Fair                              Cognition Arousal/Alertness: Awake/alert Behavior During Therapy: WFL for tasks assessed/performed Overall Cognitive Status: Within Functional Limits for tasks assessed                                        Exercises General Exercises - Lower Extremity Ankle Circles/Pumps: AROM;Both;15 reps Long Arc Quad: AROM;Both;15 reps;Seated Other Exercises Other Exercises: AAROM R foot eversion Other Exercises: Abdominal bracing 15 x 5 sec with education on importance as an exercise and with activity, Other Exercises: pt able to verbalized back precautions  and how to implement during ADL and mobility, pt able to verbalize 100% without cuing  General Comments        Pertinent Vitals/Pain Pain Assessment: 0-10 Pain Score: 4  Pain Location: lumbar spine/site of surgery with bed mobility and stand(also numbness R > L foot; mild in LEs more proximal) Pain Descriptors / Indicators: Constant;Aching Pain Intervention(s): Limited activity within patient's tolerance;Monitored during  session;Repositioned    Home Living                      Prior Function            PT Goals (current goals can now be found in the care plan section) Acute Rehab PT Goals Patient Stated Goal: to be able to walk Progress towards PT goals: Progressing toward goals(slowly)    Frequency    BID      PT Plan Current plan remains appropriate    Co-evaluation              AM-PAC PT "6 Clicks" Daily Activity  Outcome Measure  Difficulty turning over in bed (including adjusting bedclothes, sheets and blankets)?: A Little Difficulty moving from lying on back to sitting on the side of the bed? : Unable Difficulty sitting down on and standing up from a chair with arms (e.g., wheelchair, bedside commode, etc,.)?: Unable Help needed moving to and from a bed to chair (including a wheelchair)?: A Little Help needed walking in hospital room?: A Lot Help needed climbing 3-5 steps with a railing? : Total 6 Click Score: 11    End of Session Equipment Utilized During Treatment: Gait belt Activity Tolerance: Patient limited by fatigue Patient left: in chair;with call bell/phone within reach;with chair alarm set;with family/visitor present(declines alarm; will call for assist)   PT Visit Diagnosis: Muscle weakness (generalized) (M62.81);Pain;Unsteadiness on feet (R26.81);Difficulty in walking, not elsewhere classified (R26.2)     Time: 8280-0349 PT Time Calculation (min) (ACUTE ONLY): 25 min  Charges:  $Gait Training: 8-22 mins $Therapeutic Exercise: 8-22 mins                      Larae Grooms, PTA 07/02/2018, 2:30 PM

## 2018-07-02 NOTE — Plan of Care (Signed)

## 2018-07-02 NOTE — Progress Notes (Signed)
Occupational Therapy Treatment Patient Details Name: Danielle Taylor MRN: [id number] DOB: Jan 22, 1946 Today's Date: 07/02/2018    History of present illness 27/yo female pt s/p L4-5 TLIF. On 11/7 after surgery pt with new onset urinary incontinence and diminished sensation and strength BLEs.  CT scan and MRI completed and pt cleared to continue therapy.  PMHx includes lumbar radiculopathy, ashtma, COPD, HTN, CAD, depression, Graves disease, and HLD.   OT comments  Pt able to verbalize her back precautions - and did not need verbal cues- did review how to carry it over into her ADL's and ADL's. Pt was ed on using of AE for LB dressing needed Mod A  Because of numbness in feet and need to compensate with vision - pt can benefit from OT services for LB dressing using AE and bathroom transfers to bathroom and toiletting   Follow Up Recommendations       Equipment Recommendations       Recommendations for Other Services      Precautions / Restrictions Precautions Precautions: Back;Fall Restrictions Weight Bearing Restrictions: No       Mobility Bed Mobility Overal bed mobility: Needs Assistance Bed Mobility: Rolling;Sidelying to Sit;Supine to Sit Rolling: Supervision Sidelying to sit: Min assist       General bed mobility comments: cues given to slow movement and focus on 1 motion at a time: scooting hips to edge, roll then rise. MIldly impulsive  Transfers Overall transfer level: Needs assistance Equipment used: Rolling walker (2 wheeled) Transfers: Sit to/from Stand Sit to Stand: Min assist;From elevated surface         General transfer comment: Cues for visual focus on feet and on R foot flat on floor (R ankle rolls into inversion and plantar foot contracted)    Balance Overall balance assessment: Needs assistance Sitting-balance support: Bilateral upper extremity supported;Feet supported Sitting balance-Leahy Scale: Good     Standing balance support:  Bilateral upper extremity supported;During functional activity Standing balance-Leahy Scale: Fair                             ADL either performed or assessed with clinical judgement   ADL    LB dressing was done in chair sitting - prop up with pillows and feet on floor  Reacher and sockaid education done - needed Mod A  Ed on AE available   Long handle sponge for LB and back bathing  And shoehorn for foot wear                                            Vision       Perception     Praxis      Cognition Arousal/Alertness: Awake/alert Behavior During Therapy: WFL for tasks assessed/performed Overall Cognitive Status: Within Functional Limits for tasks assessed                                          Exercises  Other Exercises Other Exercises: LB dressing using AE with mod A  Other Exercises: pt able to verbalized back precautions  and how to implement during ADL and mobility, pt able to verbalize 100% without cuing   Shoulder Instructions       General Comments  Pertinent Vitals/ Pain       Pain Assessment: 0-10 Pain Score: 5  Pain Location: lumbar spine/site of surgery with bed mobility and stand(also numbness R > L foot; mild in LEs more proximal) Pain Descriptors / Indicators: Constant;Aching Pain Intervention(s): Monitored during session;Repositioned  Home Living                                          Prior Functioning/Environment              Frequency           Progress Toward Goals  OT Goals(current goals can now be found in the care plan section)     Acute Rehab OT Goals Patient Stated Goal: to be able to walk OT Goal Formulation: With patient Time For Goal Achievement: 07/14/18 Potential to Achieve Goals: Good  Plan      Co-evaluation                 AM-PAC PT "6 Clicks" Daily Activity     Outcome Measure                    End of Session         Activity Tolerance     Patient Left     Nurse Communication          Time: 0211-1735 OT Time Calculation (min): 23 min  Charges: OT Treatments $Self Care/Home Management : 23-37 mins     Evalette Montrose OTR/L,CLT 07/02/2018, 1:25 PM

## 2018-07-02 NOTE — Progress Notes (Signed)
Changed Enoxaparin from 30 mg SQ q12h to 40 mg daily for DVT prophylaxis.   Paticia Stack, PharmD Pharmacy Resident  07/02/2018 1:23 PM

## 2018-07-02 NOTE — Consult Note (Signed)
Reason for Consult:Tremor in legs  Referring Physician: Dr. Cari Caraway  CC: leg tremors   HPI: Danielle Taylor is an 72 y.o. female  pt s/p L4-5 TLIF POD #3 . Initially pt had  diminished sensation and strength BLEs which has improved.  Pt does have non sustained tremors in her legs that get worse with stimulus.    Past Medical History:  Diagnosis Date  . Allergy   . COPD (chronic obstructive pulmonary disease) ([place])   . Depression   . Dysrhythmia    Complete Heart Block  . Gastritis, chronic   . GERD (gastroesophageal reflux disease)   . Graves disease   . History of hiatal hernia   . Hyperlipidemia   . Hypertension   . Hypothyroidism   . Presence of permanent cardiac pacemaker    2012    Past Surgical History:  Procedure Laterality Date  . APPENDECTOMY    . COLONOSCOPY  2000  . COLONOSCOPY WITH PROPOFOL N/A 04/26/2015   Procedure: COLONOSCOPY WITH PROPOFOL;  Surgeon: Hulen Luster, MD;  Location: [facility] ENDOSCOPY;  Service: Gastroenterology;  Laterality: N/A;  . fibroid tumors     fingers and wrist  . FOOT SURGERY    . TONSILLECTOMY    . TRANSFORAMINAL LUMBAR INTERBODY FUSION (TLIF) WITH PEDICLE SCREW FIXATION 1 LEVEL N/A 06/29/2018   Procedure: TRANSFORAMINAL LUMBAR INTERBODY FUSION (TLIF) WITH PEDICLE SCREW FIXATION 1 LEVEL;  Surgeon: [name], MD;  Location: [facility] ORS;  Service: Neurosurgery;  Laterality: N/A;  . VAGINAL HYSTERECTOMY      Family History  Problem Relation Age of Onset  . Dementia Mother   . Heart disease Father   . Diabetes Sister   . Breast cancer Neg Hx     Social History:  reports that she quit smoking about 16 years ago. Her smoking use included cigarettes. She has a 16.50 pack-year smoking history. She has never used smokeless tobacco. She reports that she drank alcohol. She reports that she does not use drugs.  Allergies  Allergen Reactions  . Penicillins Other (See Comments)    Has patient had a PCN reaction causing immediate  rash, facial/tongue/throat swelling, SOB or lightheadedness with hypotension: No Has patient had a PCN reaction causing severe rash involving mucus membranes or skin necrosis: No Has patient had a PCN reaction that required hospitalization No Has patient had a PCN reaction occurring within the last 10 years: No If all of the above answers are "NO", then may proceed with Cephalosporin use.     Medications: I have reviewed the patient's current medications.  ROS: History obtained from the patient  General ROS: negative for - chills, fatigue, fever, night sweats, weight gain or weight loss Psychological ROS: negative for - behavioral disorder, hallucinations, memory difficulties, mood swings or suicidal ideation Ophthalmic ROS: negative for - blurry vision, double vision, eye pain or loss of vision ENT ROS: negative for - epistaxis, nasal discharge, oral lesions, sore throat, tinnitus or vertigo Allergy and Immunology ROS: negative for - hives or itchy/watery eyes Hematological and Lymphatic ROS: negative for - bleeding problems, bruising or swollen lymph nodes Endocrine ROS: negative for - galactorrhea, hair pattern changes, polydipsia/polyuria or temperature intolerance Respiratory ROS: negative for - cough, hemoptysis, shortness of breath or wheezing Cardiovascular ROS: negative for - chest pain, dyspnea on exertion, edema or irregular heartbeat Gastrointestinal ROS: negative for - abdominal pain, diarrhea, hematemesis, nausea/vomiting or stool incontinence Genito-Urinary ROS: negative for - dysuria, hematuria, incontinence or urinary frequency/urgency Musculoskeletal ROS: negative  for - joint swelling or muscular weakness Neurological ROS: as noted in HPI Dermatological ROS: negative for rash and skin lesion changes  Physical Examination: Blood pressure (!) 147/78, pulse 81, temperature (!) 97.5 F (36.4 C), temperature source Oral, resp. rate 14, SpO2 94 %. Neurological Examination    Mental Status: Alert, oriented, thought content appropriate.  Speech fluent without evidence of aphasia.  Able to follow 3 step commands without difficulty. Cranial Nerves: II: Discs flat bilaterally; Visual fields grossly normal, pupils equal, round, reactive to light and accommodation III,IV, VI: ptosis not present, extra-ocular motions intact bilaterally V,VII: smile symmetric, facial light touch sensation normal bilaterally VIII: hearing normal bilaterally IX,X: gag reflex present XI: bilateral shoulder shrug XII: midline tongue extension Motor: Decreased strength in lower extremities With 4/5 distally   Tone and bulk:normal tone throughout; no atrophy noted Sensory: Pinprick and light touch intact with decrease in bilateral lower extremities.   Deep Tendon Reflexes: 1+ Plantars: Right: downgoing   Left: downgoing Cerebellar: Not tested  Gait: not tested      Laboratory Studies:   Basic Metabolic Panel: No results for input(s): NA, K, CL, CO2, GLUCOSE, BUN, CREATININE, CALCIUM, MG, PHOS in the last 168 hours.  Liver Function Tests: No results for input(s): AST, ALT, ALKPHOS, BILITOT, PROT, ALBUMIN in the last 168 hours. No results for input(s): LIPASE, AMYLASE in the last 168 hours. No results for input(s): AMMONIA in the last 168 hours.  CBC: No results for input(s): WBC, NEUTROABS, HGB, HCT, MCV, PLT in the last 168 hours.  Cardiac Enzymes: No results for input(s): CKTOTAL, CKMB, CKMBINDEX, TROPONINI in the last 168 hours.  BNP: Invalid input(s): POCBNP  CBG: No results for input(s): GLUCAP in the last 168 hours.  Microbiology: Results for orders placed or performed during the hospital encounter of 06/23/18  Surgical pcr screen     Status: None   Collection Time: 06/23/18  2:09 PM  Result Value Ref Range Status   MRSA, PCR NEGATIVE NEGATIVE Final   Staphylococcus aureus NEGATIVE NEGATIVE Final    Comment: (NOTE) The Xpert SA Assay (FDA approved for NASAL  specimens in patients 23 years of age and older), is one component of a comprehensive surveillance program. It is not intended to diagnose infection nor to guide or monitor treatment. Performed at [facility], [address] [place], [place] [address]     Coagulation Studies: No results for input(s): LABPROT, INR in the last 72 hours.  Urinalysis: No results for input(s): COLORURINE, LABSPEC, PHURINE, GLUCOSEU, HGBUR, BILIRUBINUR, KETONESUR, PROTEINUR, UROBILINOGEN, NITRITE, LEUKOCYTESUR in the last 168 hours.  Invalid input(s): APPERANCEUR  Lipid Panel:     Component Value Date/Time   CHOL 216 (H) 05/03/2018 1154   TRIG 210 (H) 05/03/2018 1154   HDL 68 05/03/2018 1154   CHOLHDL 3.5 02/08/2017 1132   LDLCALC 106 (H) 05/03/2018 1154    HgbA1C:  Lab Results  Component Value Date   HGBA1C 6.3 (H) 03/11/2016    Urine Drug Screen:  No results found for: LABOPIA, COCAINSCRNUR, LABBENZ, AMPHETMU, THCU, LABBARB  Alcohol Level: No results for input(s): ETH in the last 168 hours.  Other results: EKG: normal EKG, normal sinus rhythm, unchanged from previous tracings.  Imaging: Ct Lumbar Spine Wo Contrast  Result Date: 06/30/2018 CLINICAL DATA:  Unable to feel legs after lumbar spine fusion procedure. Leg weakness with incontinence. EXAM: CT LUMBAR SPINE WITHOUT CONTRAST TECHNIQUE: Multidetector CT imaging of the lumbar spine was performed without intravenous contrast administration. Multiplanar  CT image reconstructions were also generated. COMPARISON:  MRI lumbar spine 06/02/2018. FINDINGS: Segmentation: 5 lumbar type vertebrae. Alignment: 3 mm anterolisthesis L4-5. Trace retrolisthesis at L1-2, L2-3, and L3-4 compensatory. Vertebrae: Recent L4-5 TLIF, described below. Paraspinal and other soft tissues: No extra-spinal hematoma or postoperative fluid collection at the surgical site. Aortic atherosclerosis. Disc levels: L1-L2: Chronic disc space narrowing, borderline  stenosis. No impingement. L2-L3: Chronic disc space narrowing. Posterior element hypertrophy. Osseous spurring. Moderate stenosis. No acute findings. L3-L4: Disc space narrowing. Posterior element hypertrophy. Osseous spurring. Moderate stenosis. No acute findings. L4-L5: Status post RIGHT L4-5 partial facetectomy and laminectomy for TLIF. Interbody cage appropriately centered, within the interspace. Slight reduction of degenerative spondylolisthesis, now 3 mm compared to 4 mm before surgery. BILATERAL L4 and BILATERAL L5 pedicle screws demonstrate no aberrant placement or medialization. Assessment for canal hematoma is limited by Hounsfield artifact. Facet arthropathy and ligamentum flavum calcification, greater on the LEFT. BILATERAL foraminal narrowing appears stable. L5-S1: Chronic disc space narrowing. Osseous spurring to the RIGHT. Functional fusion across this interspace. No definite impingement. IMPRESSION: Status post L4-5 TLIF, with hardware/pedicle screws appropriately placed and interbody cage centered within the interspace. Slight reduction in degenerative spondylolisthesis, to 3 mm. Assessment for canal hematoma is limited by Hounsfield artifact. No extra-spinal hematoma or postoperative fluid collection is observed. Electronically Signed   By: Staci Righter M.D.   On: 06/30/2018 12:28   Mr Lumbar Spine Wo Contrast  Result Date: 07/01/2018 CLINICAL DATA:  Transforaminal lumbar interbody fusion at L4-5 and posterolateral arthrodesis. Progressive numbness in the lower extremities. Unable walk. Lumbar spine surgery 06/29/2018 EXAM: MRI LUMBAR SPINE WITHOUT CONTRAST TECHNIQUE: Multiplanar, multisequence MR imaging of the lumbar spine was performed. No intravenous contrast was administered. COMPARISON:  CT of the lumbar spine 06/30/2018. MRI of the lumbar spine 06/02/2018. FINDINGS: Segmentation: 5 non rib-bearing lumbar type vertebral bodies are present. The lowest fully formed vertebral body is L5  Alignment: Slight anterolisthesis at L4-5 is fused. Minimal retrolisthesis is again noted at L2-3 and L3-4. Vertebrae: Schmorl's nodes are present at L2-3 and L3-4. There is scattered fatty infiltration of the thoracolumbar spine. Artifact related to hardware is present at L4 and L5. Conus medullaris and cauda equina: Conus extends to the L1 level. Conus and cauda equina appear normal. Paraspinal and other soft tissues: Limited imaging of the abdomen is unremarkable. There is no significant adenopathy. No solid organ lesions are present. Disc levels: T12-L1: Negative. L1-2: A mild broad-based disc protrusion is present. There is prominent epidural fat. This crowds the nerve centrally without focal compression. L2-3: A broad-based disc protrusion is again seen. Prominent epidural fat is noted. There is crowding of the nerve roots centrally with mild left subarticular stenosis. Mild left foraminal narrowing is also due to disc disease. L3-4: There is chronic loss of disc height. A broad-based disc protrusion is present. Moderate facet hypertrophy is noted. Prominent epidural fat crowds the nerve roots. Mild left subarticular narrowing is present. There is mild foraminal stenosis bilaterally without significant interval change. L4-5: Right laminectomy is present. There is soft tissue in the posterior epidural space on the right. The thecal sac and nerve roots are displaced posteriorly by what appears to be displaced residual disc material posterior to the spacer. A moderate effusion is present in the left facet joint. Mild left foraminal narrowing is present. L5-S1: There is fusion anteriorly across the disc space. The foramina are widely patent bilaterally. Prominent epidural fat is noted. IMPRESSION: 1. Postsurgical changes at L4-5 include  right laminectomy. 2. Disc spacer at L4-5 stable. 3. Soft tissue posterior to the disc spacer slightly displaces the thecal sac posteriorly. There is no compressive lesion  posterior to the thecal sac. This may create slight deviation of the nerve roots at this level. This may represent some residual disc material posterior to the spacer. There is no hematoma or compressive lesion on the thecal sac. 4. Foramina are patent bilaterally at L4-5. 5. Stable disc disease at L2-3 and L3-4. 6. Extensive epidural lipomatosis. These results were called by telephone at the time of interpretation on 07/01/2018 at 12:48 am to Dr. [name] , who verbally acknowledged these results. Electronically Signed   By: San Morelle M.D.   On: 07/01/2018 00:48     Assessment/Plan:  72 y.o. female  pt s/p L4-5 TLIF POD #3 . Initially pt had  diminished sensation and strength BLEs which has improved.  Pt does have non sustained tremors in her legs that get worse with stimulus.    - Imaging of L spine reviewed - Neuropathy is improving as well as strength - Not sure tremors are physiological as short lived and stimulus related - No further imaging at this time.  07/02/2018, 12:11 PM

## 2018-07-03 NOTE — Plan of Care (Signed)
  Problem: Health Behavior/Discharge Planning: Goal: Ability to manage health-related needs will improve Outcome: Progressing   Problem: Clinical Measurements: Goal: Ability to maintain clinical measurements within normal limits will improve Outcome: Progressing Goal: Will remain free from infection Outcome: Progressing Goal: Diagnostic test results will improve Outcome: Progressing Goal: Respiratory complications will improve Outcome: Progressing Goal: Cardiovascular complication will be avoided Outcome: Progressing   Problem: Activity: Goal: Risk for activity intolerance will decrease Outcome: Progressing   Problem: Elimination: Goal: Will not experience complications related to bowel motility Outcome: Progressing Goal: Will not experience complications related to urinary retention Outcome: Progressing   Problem: Pain Managment: Goal: General experience of comfort will improve Outcome: Progressing   Problem: Safety: Goal: Ability to remain free from injury will improve Outcome: Progressing   Problem: Skin Integrity: Goal: Risk for impaired skin integrity will decrease Outcome: Progressing   Problem: Education: Goal: Ability to verbalize activity precautions or restrictions will improve Outcome: Progressing Goal: Knowledge of the prescribed therapeutic regimen will improve Outcome: Progressing Goal: Understanding of discharge needs will improve Outcome: Progressing   Problem: Activity: Goal: Ability to avoid complications of mobility impairment will improve Outcome: Progressing Goal: Ability to tolerate increased activity will improve Outcome: Progressing Goal: Will remain free from falls Outcome: Progressing   Problem: Bowel/Gastric: Goal: Gastrointestinal status for postoperative course will improve Outcome: Progressing   Problem: Clinical Measurements: Goal: Ability to maintain clinical measurements within normal limits will improve Outcome:  Progressing Goal: Postoperative complications will be avoided or minimized Outcome: Progressing Goal: Diagnostic test results will improve Outcome: Progressing   Problem: Pain Management: Goal: Pain level will decrease Outcome: Progressing   Problem: Skin Integrity: Goal: Will show signs of wound healing Outcome: Progressing   Problem: Health Behavior/Discharge Planning: Goal: Identification of resources available to assist in meeting health care needs will improve Outcome: Progressing   Problem: Bladder/Genitourinary: Goal: Urinary functional status for postoperative course will improve Outcome: Progressing

## 2018-07-03 NOTE — Progress Notes (Signed)
Physical Therapy Treatment Patient Details Name: Danielle Taylor MRN: [id number] DOB: [birth year] Today's Date: 07/03/2018    History of Present Illness 42/yo female pt s/p L4-5 TLIF. On 11/7 after surgery pt with new onset urinary incontinence and diminished sensation and strength BLEs.  CT scan and MRI completed and pt cleared to continue therapy.  PMHx includes lumbar radiculopathy, ashtma, COPD, HTN, CAD, depression, Graves disease, and HLD.    PT Comments    Pt excited about ability to actively evert L ankle today (3/5 strength, unable to resist pressure) and was able to control foot posture with WBing/ambulation this date.  She did well and overall showed improvement with all aspects of PT including resistance with exercises, improved mobility and had her longest bout of ambulation thus far. She showed great effort t/o the session and though she fatigue in UEs during ambulation she was determined to go as far as she could (husband following with recliner).  Pt very pleased with how she did today, spoke with family about the possibility of inpatient rehab and they are very interested.  I feel that she is an ideal candidate for the setting and eager to keep pushing herself to get back to a more independent/functional status.   Follow Up Recommendations  (inpatient rehab (prefer [facility] as they live in [place]))     Equipment Recommendations  Rolling walker with 5" wheels    Recommendations for Other Services       Precautions / Restrictions Precautions Precautions: Back;Fall Restrictions Weight Bearing Restrictions: No    Mobility  Bed Mobility Overal bed mobility: Needs Assistance Bed Mobility: Rolling;Sidelying to Sit Rolling: Supervision Sidelying to sit: Min assist       General bed mobility comments: Pt showed very good effort, awareness of spinal alignment.  Light assist to get LEs off EOB  Transfers Overall transfer level: Needs assistance Equipment used: Rolling  walker (2 wheeled) Transfers: Sit to/from Stand Sit to Stand: Min assist         General transfer comment: Pt was able to rise to standing with only light assist to keep weight forward, showed great effort in getting to standing with heavy UE use.  No knee buckling on initial standing.   Ambulation/Gait Ambulation/Gait assistance: Mod assist Gait Distance (Feet): 18 Feet Assistive device: Rolling walker (2 wheeled)       General Gait Details: Pt able to place foot and control inversion with deliberate WBing, however she struggled to fully shift weight to LE and had some occasional knee buckling that she was able to self arrest with walker but as her UE fatigued she needed more assist to maintain standing with prolnoged ambulation.  She had some minimal veering to the R and remained heavily reliant on UEs t/o the effort but ultimately she did not need more than rare min assist for the first 10 feet and then gradually increased assist 2/2/ fatigue.   Pt able to adjust step length b/l with cuing.   Stairs             Wheelchair Mobility    Modified Rankin (Stroke Patients Only)       Balance Overall balance assessment: Needs assistance Sitting-balance support: Bilateral upper extremity supported;Feet supported Sitting balance-Leahy Scale: Good     Standing balance support: Bilateral upper extremity supported;During functional activity Standing balance-Leahy Scale: Fair Standing balance comment: highly reliant on UEs/AD in standing  Cognition Arousal/Alertness: Awake/alert Behavior During Therapy: WFL for tasks assessed/performed Overall Cognitive Status: Within Functional Limits for tasks assessed                                        Exercises General Exercises - Lower Extremity Ankle Circles/Pumps: AROM;10 reps Quad Sets: Strengthening;10 reps Short Arc Quad: Strengthening;10 reps Heel Slides:  Strengthening;10 reps(with resisted leg extension) Hip ABduction/ADduction: Strengthening;10 reps    General Comments        Pertinent Vitals/Pain Pain Score: (reports only very minimal pain)    Home Living                      Prior Function            PT Goals (current goals can now be found in the care plan section) Progress towards PT goals: Progressing toward goals    Frequency    BID      PT Plan Current plan remains appropriate    Co-evaluation              AM-PAC PT "6 Clicks" Daily Activity  Outcome Measure  Difficulty turning over in bed (including adjusting bedclothes, sheets and blankets)?: A Little Difficulty moving from lying on back to sitting on the side of the bed? : Unable Difficulty sitting down on and standing up from a chair with arms (e.g., wheelchair, bedside commode, etc,.)?: Unable Help needed moving to and from a bed to chair (including a wheelchair)?: A Little Help needed walking in hospital room?: A Lot Help needed climbing 3-5 steps with a railing? : Total 6 Click Score: 11    End of Session Equipment Utilized During Treatment: Gait belt Activity Tolerance: Patient limited by fatigue Patient left: in chair;with call bell/phone within reach;with chair alarm set;with family/visitor present Nurse Communication: Mobility status PT Visit Diagnosis: Muscle weakness (generalized) (M62.81);Pain;Unsteadiness on feet (R26.81);Difficulty in walking, not elsewhere classified (R26.2);Other symptoms and signs involving the nervous system (R29.898)     Time: 2229-7989 PT Time Calculation (min) (ACUTE ONLY): 47 min  Charges:  $Gait Training: 8-22 mins $Therapeutic Exercise: 8-22 mins $Therapeutic Activity: 8-22 mins                     Kreg Shropshire, DPT 07/03/2018, 4:36 PM

## 2018-07-03 NOTE — Progress Notes (Signed)
Procedure: L4-5 TLIF Procedure Date: 06/29/2018 Diagnosis: Lumbar radiculopathy  History: Danielle Taylor is here for L4-5 TLIF for lumbar radiculopathy.  POD4: She has continued to have improvements in her RLE function.  Pain remains well controlled.  She would like to continue foley until tomorrow.  POD3: Danielle Taylor had some improvement of symptoms yesterday.  She has had slight improvement in her L foot function, which is nearly normal now.  She is able to dorsiflex on the R, but still have eversion weakness.  Her lumbar radiculopathy from preop is improved.  POD2: Overall, she feels symptoms are improving.  She is slowly regaining strength and feet and numbness seems to be improving.   However, she does show concern for leg tremors that have presented following surgery. On further evaluation of groin/buttock numbness, she states this is primarily in the bilateral buttocks but she still has sensation in genitals and rectum.  Continues to work with PT and mobility is very gradually improving.  Back pain still rated 5/10.  Due to presentation of neurological deficits postoperatively, patient completed CT of the lumbar spine as well as MRI of the lumbar spine.  Dr. Cari Caraway had reviewed these and imaging does not reveal that any additional surgical intervention is recommended at this time.  She was started on high-dose steroids and gabapentin, with continued physical therapy.  External catheter replaced with Foley.  POD1: Complains of numbness in her feet that results in inability to ambulate and stand. Expresses that she had numbness in her feet prior to surgery, but feels that it may be worse especially since she doesn't feel that she can walk because of it.  Also states that yesterday she felt the same in her legs but this has resolved. Denies weakness or pain in lower extremities. Back pain is minimal. Overall pain 5/10. External catheter was placed due to inability to ambulate. Drain  output too minimal for recording.   POD0: evaluated while still waking up from anesthesia but able to answer questions and follow most commands.  Pain currently 5/10, most prominent in back. Denies any lower extremity symptoms at this time including pain/numbness/tingling.    Physical Exam: Vitals:   07/02/18 2345 07/03/18 0820  BP: (!) 103/49 (!) 129/37  Pulse: 80 80  Resp:  20  Temp: 97.7 F (36.5 C) (!) 96.3 F (35.7 C)  SpO2: 93% 95%    AA Ox3 Strength: Left lower extremity: 5 out of 5 iliopsoas, hamstring, quad, plantarflexion, dorsiflexion.  4+/5 EHL.  Inversion and eversion intact. Right lower extremity: 5/5 iliopsoas, hamstring, quad, plantarflexion, dorsiflexion.  Minimal but independent movement of great toe as well as minimal movement with eversion.  Foot appears in state of inversion.  Inversion 5/5, dorsiflexion 4/5.  Eversion 3/5, EHL 3/5 Sensation: Intact and symmetric through lower extremity thighs and calves.  Decreased sensation right lateral foot and at the toes.  Incision c/d/i  Data:  No results for input(s): NA, K, CL, CO2, BUN, CREATININE, LABGLOM, GLUCOSE, CALCIUM in the last 168 hours. No results for input(s): AST, ALT, ALKPHOS in the last 168 hours.  Invalid input(s): TBILI   No results for input(s): WBC, HGB, HCT, PLT in the last 168 hours. No results for input(s): APTT, INR in the last 168 hours.       Other tests/results:  EXAM: LUMBAR SPINE - 2-3 VIEW; DG C-ARM 61-120 MIN  COMPARISON:  Lumbar MRI 06/02/2018.  FINDINGS: Fifteen intraoperative fluoroscopic spot views of the lower lumbar spine demonstrate L4-L5  level posterior and interbody fusion hardware placement, when using the same numbering system as on the comparison MRI.  FLUOROSCOPY TIME:  1 minutes 43 seconds  IMPRESSION: L4-L5 posterior and interbody fusion.  EXAM: CT LUMBAR SPINE WITHOUT CONTRAST  TECHNIQUE: Multidetector CT imaging of the lumbar spine was performed  without intravenous contrast administration. Multiplanar CT image reconstructions were also generated.  COMPARISON:  MRI lumbar spine 06/02/2018.  FINDINGS: Segmentation: 5 lumbar type vertebrae.  Alignment: 3 mm anterolisthesis L4-5. Trace retrolisthesis at L1-2, L2-3, and L3-4 compensatory.  Vertebrae: Recent L4-5 TLIF, described below.  Paraspinal and other soft tissues: No extra-spinal hematoma or postoperative fluid collection at the surgical site. Aortic atherosclerosis.  Disc levels:  L1-L2: Chronic disc space narrowing, borderline stenosis. No impingement.  L2-L3: Chronic disc space narrowing. Posterior element hypertrophy. Osseous spurring. Moderate stenosis. No acute findings.  L3-L4: Disc space narrowing. Posterior element hypertrophy. Osseous spurring. Moderate stenosis. No acute findings.  L4-L5: Status post RIGHT L4-5 partial facetectomy and laminectomy for TLIF. Interbody cage appropriately centered, within the interspace. Slight reduction of degenerative spondylolisthesis, now 3 mm compared to 4 mm before surgery. BILATERAL L4 and BILATERAL L5 pedicle screws demonstrate no aberrant placement or medialization. Assessment for canal hematoma is limited by Hounsfield artifact. Facet arthropathy and ligamentum flavum calcification, greater on the LEFT. BILATERAL foraminal narrowing appears stable.  L5-S1: Chronic disc space narrowing. Osseous spurring to the RIGHT. Functional fusion across this interspace. No definite impingement.  IMPRESSION: Status post L4-5 TLIF, with hardware/pedicle screws appropriately placed and interbody cage centered within the interspace. Slight reduction in degenerative spondylolisthesis, to 3 mm.  Assessment for canal hematoma is limited by Hounsfield artifact. No extra-spinal hematoma or postoperative fluid collection is observed.  EXAM: MRI LUMBAR SPINE WITHOUT CONTRAST  TECHNIQUE: Multiplanar, multisequence  MR imaging of the lumbar spine was performed. No intravenous contrast was administered.  COMPARISON:  CT of the lumbar spine 06/30/2018. MRI of the lumbar spine 06/02/2018.  FINDINGS: Segmentation: 5 non rib-bearing lumbar type vertebral bodies are present. The lowest fully formed vertebral body is L5  Alignment: Slight anterolisthesis at L4-5 is fused. Minimal retrolisthesis is again noted at L2-3 and L3-4.  Vertebrae: Schmorl's nodes are present at L2-3 and L3-4. There is scattered fatty infiltration of the thoracolumbar spine. Artifact related to hardware is present at L4 and L5.  Conus medullaris and cauda equina: Conus extends to the L1 level. Conus and cauda equina appear normal.  Paraspinal and other soft tissues: Limited imaging of the abdomen is unremarkable. There is no significant adenopathy. No solid organ lesions are present.  Disc levels:  T12-L1: Negative.  L1-2: A mild broad-based disc protrusion is present. There is prominent epidural fat. This crowds the nerve centrally without focal compression.  L2-3: A broad-based disc protrusion is again seen. Prominent epidural fat is noted. There is crowding of the nerve roots centrally with mild left subarticular stenosis. Mild left foraminal narrowing is also due to disc disease.  L3-4: There is chronic loss of disc height. A broad-based disc protrusion is present. Moderate facet hypertrophy is noted. Prominent epidural fat crowds the nerve roots. Mild left subarticular narrowing is present. There is mild foraminal stenosis bilaterally without significant interval change.  L4-5: Right laminectomy is present. There is soft tissue in the posterior epidural space on the right. The thecal sac and nerve roots are displaced posteriorly by what appears to be displaced residual disc material posterior to the spacer. A moderate effusion is present in the left facet  joint. Mild left foraminal narrowing  is present.  L5-S1: There is fusion anteriorly across the disc space. The foramina are widely patent bilaterally. Prominent epidural fat is noted.  IMPRESSION: 1. Postsurgical changes at L4-5 include right laminectomy. 2. Disc spacer at L4-5 stable. 3. Soft tissue posterior to the disc spacer slightly displaces the thecal sac posteriorly. There is no compressive lesion posterior to the thecal sac. This may create slight deviation of the nerve roots at this level. This may represent some residual disc material posterior to the spacer. There is no hematoma or compressive lesion on the thecal sac. 4. Foramina are patent bilaterally at L4-5. 5. Stable disc disease at L2-3 and L3-4. 6. Extensive epidural lipomatosis.  Assessment/Plan:  Shonia Skilling is POD4 s/p L4-5 TLIF.  Her RLE symptoms prior to surgery are improved.  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  This has significantly improved over the past 3 days.  - wean dexamethasone - HLIV - mobilize - pain control - DVT prophylaxis - PTOT - Dc foley catheter tomorrow  [name] MD Department of Neurosurgery

## 2018-07-04 MED ORDER — SENNOSIDES-DOCUSATE SODIUM 8.6-50 MG PO TABS
1.0000 | ORAL_TABLET | Freq: Every day | ORAL | Status: DC | PRN
  Administered 2018-07-04: 1 via ORAL
  Filled 2018-07-04: qty 1

## 2018-07-04 MED ORDER — MAGNESIUM HYDROXIDE 400 MG/5ML PO SUSP
15.0000 mL | Freq: Every day | ORAL | Status: DC | PRN
  Administered 2018-07-05: 15 mL via ORAL
  Filled 2018-07-04: qty 30

## 2018-07-04 NOTE — Progress Notes (Signed)
Physical Therapy Treatment Patient Details Name: Danielle Taylor MRN: [id number] DOB: [birth year] Today's Date: 07/04/2018    History of Present Illness 19/yo female pt s/p L4-5 TLIF. On 11/7 after surgery pt with new onset urinary incontinence and diminished sensation and strength BLEs.  CT scan and MRI completed and pt cleared to continue therapy.  PMHx includes lumbar radiculopathy, ashtma, COPD, HTN, CAD, depression, Graves disease, and HLD.   PT Comments    Ms. Wallner continues to make good progress but does require assist for transfers.  She demonstrates partial R knee buckle when ambulating, thus introduced therapeutic exercises specifically to target R quad strengthening this session.  Pt does require a rest break after ambulating 20 ft due to fatigue and pain. Follow up recommendations remain appropriate.   Follow Up Recommendations  (inpatient rehab (prefer [facility] as they live in [place]))     Equipment Recommendations  Rolling walker with 5" wheels    Recommendations for Other Services       Precautions / Restrictions Precautions Precautions: Back;Fall Precaution Comments: pt able to recall 100% of back precautions without further instruction Restrictions Weight Bearing Restrictions: No    Mobility  Bed Mobility Overal bed mobility: Needs Assistance Bed Mobility: Rolling;Sidelying to Sit Rolling: Supervision Sidelying to sit: Supervision       General bed mobility comments: Pt sitting in chair at start and end of session  Transfers Overall transfer level: Needs assistance Equipment used: Rolling walker (2 wheeled) Transfers: Sit to/from Stand Sit to Stand: Min assist         General transfer comment: Min assist to remain steady with sit<>stand.  Pt requires verbal cues for proper hand placement and safe technique.  Ambulation/Gait Ambulation/Gait assistance: Min assist Gait Distance (Feet): 20 Feet Assistive device: Rolling walker (2  wheeled) Gait Pattern/deviations: Step-to pattern;Antalgic Gait velocity: decreased   General Gait Details: Partial R knee buckle as pt begins to fatigue.  Pt does rely heavily on BUE support through RW. Chair follow for safety and for pt to sit after ambulating 20 ft.  Pt's ambulatory endurance limited mainly by pain.    Stairs             Wheelchair Mobility    Modified Rankin (Stroke Patients Only)       Balance Overall balance assessment: Needs assistance Sitting-balance support: No upper extremity supported;Feet supported Sitting balance-Leahy Scale: Good     Standing balance support: Bilateral upper extremity supported;During functional activity Standing balance-Leahy Scale: Poor Standing balance comment: Pt relies on BUE support for static and dynamic activities                            Cognition Arousal/Alertness: Awake/alert Behavior During Therapy: WFL for tasks assessed/performed Overall Cognitive Status: Within Functional Limits for tasks assessed                                        Exercises Other Exercises Other Exercises: Standing R TKE with RW for support with yellow theraband.  x15 Other Exercises: Staggered stance quad sets with R foot in front x10 and R foot behind x10 with cues for proper technique.  Other Exercises: Seated manually resisted R knee E x10    General Comments        Pertinent Vitals/Pain Pain Assessment: Faces Pain Score: 5  Faces Pain Scale: Hurts  whole lot Pain Location: lower back Pain Descriptors / Indicators: Discomfort;Aching;Moaning;Grimacing Pain Intervention(s): Limited activity within patient's tolerance;Monitored during session;Repositioned;Utilized relaxation techniques    Home Living                      Prior Function            PT Goals (current goals can now be found in the care plan section) Acute Rehab PT Goals Patient Stated Goal: to be able to walk PT Goal  Formulation: With patient Time For Goal Achievement: 07/14/18 Potential to Achieve Goals: Fair Progress towards PT goals: Progressing toward goals    Frequency    BID      PT Plan Current plan remains appropriate    Co-evaluation              AM-PAC PT "6 Clicks" Daily Activity  Outcome Measure  Difficulty turning over in bed (including adjusting bedclothes, sheets and blankets)?: A Little Difficulty moving from lying on back to sitting on the side of the bed? : A Little Difficulty sitting down on and standing up from a chair with arms (e.g., wheelchair, bedside commode, etc,.)?: Unable Help needed moving to and from a bed to chair (including a wheelchair)?: A Little Help needed walking in hospital room?: A Little Help needed climbing 3-5 steps with a railing? : Total 6 Click Score: 14    End of Session Equipment Utilized During Treatment: Gait belt Activity Tolerance: Patient limited by fatigue;Patient limited by pain Patient left: in chair;with call bell/phone within reach;with chair alarm set Nurse Communication: Mobility status PT Visit Diagnosis: Muscle weakness (generalized) (M62.81);Pain;Unsteadiness on feet (R26.81);Difficulty in walking, not elsewhere classified (R26.2);Other symptoms and signs involving the nervous system (R29.898) Pain - Right/Left: (lower) Pain - part of body: (back)     Time: 5929-2446 PT Time Calculation (min) (ACUTE ONLY): 23 min  Charges:  $Gait Training: 8-22 mins $Therapeutic Exercise: 8-22 mins                     Collie Siad PT, DPT 07/04/2018, 4:50 PM

## 2018-07-04 NOTE — Progress Notes (Signed)
Occupational Therapy Treatment Patient Details Name: Danielle Taylor MRN: [id number] DOB: 1946/05/11 Today's Date: 07/04/2018    History of present illness 53/yo female pt s/p L4-5 TLIF. On 11/7 after surgery pt with new onset urinary incontinence and diminished sensation and strength BLEs.  CT scan and MRI completed and pt cleared to continue therapy.  PMHx includes lumbar radiculopathy, ashtma, COPD, HTN, CAD, depression, Graves disease, and HLD.   OT comments  Pt seen for OT tx this am, pt eager to work with therapist. Pt reports sensation and functional use of BLE is improving. Pt performed bed mobility with supervision to EOB, required cues and min-mod assist for initial STS transfer with cues for RW placement and hand/foot placement to improve stability. Upon standing, pt incontinent of urine (had foley cath removed this am). Pt unable to control urine. Required CGA to Min A for [facility] transfer and cues for safety, as pt was attempting to sit quickly 2/2 incontinence. Bed linens noted to be soiled as well. New linens placed with assist from CNA. Once seated on BSC pt instructed in AE use for LB dressing/clothing mgt strategies. Pt able to return demo use with underwear to thread over feet with supervision, CGA to complete clothing mgt after toileting with CGA. Pt able to perform hygiene with CGA and set up. Pt reported continued impaired sensation to perianal area. Pt able to ambulate with close CGA approx 4 ft to EOB with a controlled sit. CNA applied external catheter at end of session. CNA and neurosurgery PA notified of pt's mobility, continued impaired sensation, and incontinence episode. Pt progressing towards goals, will continue to progress. STR remains appropriate.   Follow Up Recommendations  SNF    Equipment Recommendations  3 in 1 bedside commode;Tub/shower seat;Other (comment)(reacher)    Recommendations for Other Services      Precautions / Restrictions  Precautions Precautions: Back;Fall Precaution Comments: pt able to recall 100% of back precautions without further instruction Restrictions Weight Bearing Restrictions: No       Mobility Bed Mobility Overal bed mobility: Needs Assistance Bed Mobility: Supine to Sit;Sit to Supine;Rolling Rolling: Supervision   Supine to sit: Supervision;HOB elevated   Sit to sidelying: Min guard General bed mobility comments: slow, good safety awareness while maintaining precautions, CGA for sit>sidelying for BLE mgt  Transfers Overall transfer level: Needs assistance Equipment used: Rolling walker (2 wheeled) Transfers: Sit to/from Stand Sit to Stand: Min assist;Mod assist         General transfer comment: Heavy VC for hand/foot placement upon standing     Balance Overall balance assessment: Needs assistance Sitting-balance support: Single extremity supported;Feet supported Sitting balance-Leahy Scale: Good     Standing balance support: Bilateral upper extremity supported;During functional activity Standing balance-Leahy Scale: Fair Standing balance comment: initially poor balance, with VC for hand/foot placement balance improved                           ADL either performed or assessed with clinical judgement   ADL Overall ADL's : Needs assistance/impaired                         Toilet Transfer: BSC;RW;Minimal assistance;Moderate assistance;Cueing for safety;Ambulation;Cueing for sequencing Toilet Transfer Details (indicate cue type and reason): Max VC for hand/foot placement, use of RW, and safety; pt slightly unsafe 2/2 sudden need to urinate upon standing EOB and unable to control bladder Toileting- Clothing Manipulation and Hygiene:  Sit to/from stand;Set up;Min guard Toileting - Clothing Manipulation Details (indicate cue type and reason): with set up of tissue, pt able to stand and perform hygiene in standing front/back with CGA and no LOB with LUE on RW  for stability; with use of RW from seated position, pt able to return demo use of reacher to initiate donning of underwear over feet and able to complete donning with 1 hand at a time in standing with CGA     Functional mobility during ADLs: Min guard;Cueing for sequencing;Cueing for safety;Rolling walker       Vision       Perception     Praxis      Cognition Arousal/Alertness: Awake/alert Behavior During Therapy: WFL for tasks assessed/performed Overall Cognitive Status: Within Functional Limits for tasks assessed                                          Exercises Other Exercises Other Exercises: pt instructed in use of AE for LB dressing with pt able to return demo with good technique both in sitting and standing to don underwear Other Exercises: pt instructed in sequencing for functional mobility to improve stability and safety   Shoulder Instructions       General Comments pt incontinent of urine upon standing, continues to have impaired sensation to B feet and to sacral area - CNA and neuro-sx PA notified    Pertinent Vitals/ Pain       Pain Assessment: 0-10 Pain Score: 4  Pain Location: lumbar spine/site of surgery  Pain Descriptors / Indicators: Aching Pain Intervention(s): Limited activity within patient's tolerance;Monitored during session;Repositioned  Home Living                                          Prior Functioning/Environment              Frequency  Min 2X/week        Progress Toward Goals  OT Goals(current goals can now be found in the care plan section)  Progress towards OT goals: Progressing toward goals  Acute Rehab OT Goals Patient Stated Goal: to be able to walk OT Goal Formulation: With patient Time For Goal Achievement: 07/14/18 Potential to Achieve Goals: Good  Plan Frequency remains appropriate;Discharge plan remains appropriate    Co-evaluation                 AM-PAC PT "6  Clicks" Daily Activity     Outcome Measure   Help from another person eating meals?: None Help from another person taking care of personal grooming?: None Help from another person toileting, which includes using toliet, bedpan, or urinal?: A Little Help from another person bathing (including washing, rinsing, drying)?: A Lot Help from another person to put on and taking off regular upper body clothing?: None Help from another person to put on and taking off regular lower body clothing?: A Lot 6 Click Score: 19    End of Session Equipment Utilized During Treatment: Gait belt;Rolling walker  OT Visit Diagnosis: Other abnormalities of gait and mobility (R26.89);Muscle weakness (generalized) (M62.81)   Activity Tolerance Patient tolerated treatment well   Patient Left in bed;with call bell/phone within reach;with bed alarm set;with SCD's reapplied;with family/visitor present   Nurse Communication Other (comment)(CNA notified  of urinary incontinence)        Time: 4709-6283 OT Time Calculation (min): 24 min  Charges: OT General Charges $OT Visit: 1 Visit OT Treatments $Self Care/Home Management : 23-37 mins  Jeni Salles, MPH, MS, OTR/L ascom [phone] 07/04/18, 10:10 AM

## 2018-07-04 NOTE — Care Management (Addendum)
TC made to [facility] [phone]) per patients request and PT recommendations. LM on VM of "Wilfred Curtis" for request of return call.

## 2018-07-04 NOTE — Progress Notes (Signed)
Physical Therapy Treatment Patient Details Name: Danielle Taylor MRN: [id number] DOB: October 23, 1945 Today's Date: 07/04/2018    History of Present Illness 17/yo female pt s/p L4-5 TLIF. On 11/7 after surgery pt with new onset urinary incontinence and diminished sensation and strength BLEs.  CT scan and MRI completed and pt cleared to continue therapy.  PMHx includes lumbar radiculopathy, ashtma, COPD, HTN, CAD, depression, Graves disease, and HLD.    PT Comments    Ms. Castilla continues to make good progress toward therapy goal and is extremely motivated to improve her strength and independence.  She does, however, continue to require assist with transfers and ambulation.  Pt requires seated rest break after ambulating 20 ft and demonstrates partial R knee buckle as she fatigues. Inpatient rehab remains most appropriate d/c plan at this time.    Follow Up Recommendations  (inpatient rehab (prefer [facility] as they live in [place]))     Equipment Recommendations  Rolling walker with 5" wheels    Recommendations for Other Services       Precautions / Restrictions Precautions Precautions: Back;Fall Precaution Comments: pt able to recall 100% of back precautions without further instruction Restrictions Weight Bearing Restrictions: No    Mobility  Bed Mobility Overal bed mobility: Needs Assistance Bed Mobility: Rolling;Sidelying to Sit Rolling: Supervision Sidelying to sit: Supervision Supine to sit: Supervision;HOB elevated   Sit to sidelying: Min guard General bed mobility comments: Pt demonstrates proper technique without cues.  Supervision for safety.   Transfers Overall transfer level: Needs assistance Equipment used: Rolling walker (2 wheeled) Transfers: Sit to/from Stand Sit to Stand: Min assist         General transfer comment: Min assist to remain steady with sit<>stand.  Pt does require verbal cues for proper hand placement and safe technique as pt initially  with BUEs on RW.   Ambulation/Gait Ambulation/Gait assistance: Min assist Gait Distance (Feet): 30 Feet(20, 10) Assistive device: Rolling walker (2 wheeled) Gait Pattern/deviations: Step-to pattern;Antalgic Gait velocity: decreased   General Gait Details: Partial R knee buckle as pt begins to fatigue.  Pt does rely heavily on BUE support through RW.  Provided pt with youth RW as current RW too high.  Pt required one seated rest break after ambulating 20 ft due to fatigue.  Chair follow for safety.     Stairs             Wheelchair Mobility    Modified Rankin (Stroke Patients Only)       Balance Overall balance assessment: Needs assistance Sitting-balance support: No upper extremity supported;Feet supported Sitting balance-Leahy Scale: Good     Standing balance support: Bilateral upper extremity supported;During functional activity Standing balance-Leahy Scale: Poor Standing balance comment: Pt relies on BUE support for static and dynamic activities                            Cognition Arousal/Alertness: Awake/alert Behavior During Therapy: WFL for tasks assessed/performed Overall Cognitive Status: Within Functional Limits for tasks assessed                                        Exercises Other Exercises Other Exercises: Bil ankle circles clockwise and counterclockwise x5 each direction each LE Other Exercises: Marching in place in standing with RW x5 each LE, limited by fatigue.   Other Exercises: Seated manually resisted R  knee E x10 Other Exercises: pt instructed in use of AE for LB dressing with pt able to return demo with good technique both in sitting and standing to don underwear Other Exercises: pt instructed in sequencing for functional mobility to improve stability and safety    General Comments General comments (skin integrity, edema, etc.): pt incontinent of urine upon standing, continues to have impaired sensation to B feet  and to sacral area - CNA and neuro-sx PA notified      Pertinent Vitals/Pain Pain Assessment: 0-10 Pain Score: 5  Pain Location: lower back Pain Descriptors / Indicators: Discomfort Pain Intervention(s): Limited activity within patient's tolerance;Monitored during session;Repositioned;Utilized relaxation techniques    Home Living                      Prior Function            PT Goals (current goals can now be found in the care plan section) Acute Rehab PT Goals Patient Stated Goal: to be able to walk PT Goal Formulation: With patient Time For Goal Achievement: 07/14/18 Potential to Achieve Goals: Fair Progress towards PT goals: Progressing toward goals    Frequency    BID      PT Plan Current plan remains appropriate    Co-evaluation              AM-PAC PT "6 Clicks" Daily Activity  Outcome Measure  Difficulty turning over in bed (including adjusting bedclothes, sheets and blankets)?: A Little Difficulty moving from lying on back to sitting on the side of the bed? : A Little Difficulty sitting down on and standing up from a chair with arms (e.g., wheelchair, bedside commode, etc,.)?: Unable Help needed moving to and from a bed to chair (including a wheelchair)?: A Little Help needed walking in hospital room?: A Little Help needed climbing 3-5 steps with a railing? : Total 6 Click Score: 14    End of Session Equipment Utilized During Treatment: Gait belt Activity Tolerance: Patient limited by fatigue Patient left: in chair;with call bell/phone within reach;with chair alarm set Nurse Communication: Mobility status PT Visit Diagnosis: Muscle weakness (generalized) (M62.81);Pain;Unsteadiness on feet (R26.81);Difficulty in walking, not elsewhere classified (R26.2);Other symptoms and signs involving the nervous system (R29.898) Pain - Right/Left: (lower) Pain - part of body: (back)     Time: 4580-9983 PT Time Calculation (min) (ACUTE ONLY): 35  min  Charges:  $Gait Training: 23-37 mins                    Collie Siad PT, DPT 07/04/2018, 1:41 PM

## 2018-07-04 NOTE — Care Management (Signed)
Faxed px notes, h & p, PT, OT and face sheet to [facility] at [facility] inpatient rehab. She will call RNCM back with bed status.

## 2018-07-04 NOTE — Plan of Care (Signed)
  Problem: Health Behavior/Discharge Planning: Goal: Ability to manage health-related needs will improve Outcome: Progressing   Problem: Clinical Measurements: Goal: Ability to maintain clinical measurements within normal limits will improve Outcome: Progressing Goal: Will remain free from infection Outcome: Progressing Goal: Diagnostic test results will improve Outcome: Progressing Goal: Respiratory complications will improve Outcome: Progressing Goal: Cardiovascular complication will be avoided Outcome: Progressing   Problem: Activity: Goal: Risk for activity intolerance will decrease Outcome: Progressing   Problem: Elimination: Goal: Will not experience complications related to bowel motility Outcome: Progressing Goal: Will not experience complications related to urinary retention Outcome: Progressing   Problem: Pain Managment: Goal: General experience of comfort will improve Outcome: Progressing   Problem: Safety: Goal: Ability to remain free from injury will improve Outcome: Progressing   Problem: Skin Integrity: Goal: Risk for impaired skin integrity will decrease Outcome: Progressing   Problem: Education: Goal: Ability to verbalize activity precautions or restrictions will improve Outcome: Progressing Goal: Knowledge of the prescribed therapeutic regimen will improve Outcome: Progressing Goal: Understanding of discharge needs will improve Outcome: Progressing

## 2018-07-04 NOTE — Care Management Important Message (Signed)
Important Message  Patient Details  Name: Danielle Taylor MRN: [id number] Date of Birth: 08-11-1946   Medicare Important Message Given:  Yes    [name] 07/04/2018, 10:46 AM

## 2018-07-04 NOTE — Progress Notes (Signed)
Procedure: L4-5 TLIF Procedure Date: 06/29/2018 Diagnosis: Lumbar radiculopathy  History: Danielle Taylor is here for L4-5 TLIF for lumbar radiculopathy.  POD5: Foley was removed today and then she received OT. When rising, patient experienced incontinence. When she wiped, complained of decreased sensation.  She has not had a bowel movement since surgery. Strength continues to improve, but numbness persists. She is unable to determine if there has been any improvement in sensation. Was able to ambulate to door with PT yesterday. Pain 4/10, adequately controlled with tylenol. Inversion state in right foot has improved.    POD4: She has continued to have improvements in her RLE function.  Pain remains well controlled.  She would like to continue foley until tomorrow.  POD3: Mrs. Redlich had some improvement of symptoms yesterday.  She has had slight improvement in her L foot function, which is nearly normal now.  She is able to dorsiflex on the R, but still have eversion weakness.  Her lumbar radiculopathy from preop is improved.  POD2: Overall, she feels symptoms are improving.  She is slowly regaining strength and feet and numbness seems to be improving.   However, she does show concern for leg tremors that have presented following surgery. On further evaluation of groin/buttock numbness, she states this is primarily in the bilateral buttocks but she still has sensation in genitals and rectum.  Continues to work with PT and mobility is very gradually improving.  Back pain still rated 5/10.  Due to presentation of neurological deficits postoperatively, patient completed CT of the lumbar spine as well as MRI of the lumbar spine.  Dr. Cari Caraway had reviewed these and imaging does not reveal that any additional surgical intervention is recommended at this time.  She was started on high-dose steroids and gabapentin, with continued physical therapy.  External catheter replaced with Foley.  POD1:  Complains of numbness in her feet that results in inability to ambulate and stand. Expresses that she had numbness in her feet prior to surgery, but feels that it may be worse especially since she doesn't feel that she can walk because of it.  Also states that yesterday she felt the same in her legs but this has resolved. Denies weakness or pain in lower extremities. Back pain is minimal. Overall pain 5/10. External catheter was placed due to inability to ambulate. Drain output too minimal for recording.   POD0: evaluated while still waking up from anesthesia but able to answer questions and follow most commands.  Pain currently 5/10, most prominent in back. Denies any lower extremity symptoms at this time including pain/numbness/tingling.    Physical Exam: Vitals:   07/03/18 2356 07/04/18 0750  BP: 135/74 (!) 150/85  Pulse: 80 80  Resp:    Temp: 98.7 F (37.1 C) 97.8 F (36.6 C)  SpO2: 96% 95%    AA Ox3 Strength: Left lower extremity: 5 out of 5 iliopsoas, hamstring, quad, plantarflexion, dorsiflexion.  4+/5 EHL.  Inversion and eversion intact. Right lower extremity: 5/5 iliopsoas, hamstring, quad, plantarflexion, dorsiflexion. 4-/5 EHL.  Inversion 5/5,  eversion 3/5 Sensation: Intact and symmetric through lower extremity thighs and calves.  Decreased sensation right lateral foot and at the toes. Skin: glue intact. No bleeding, tenderness, warmth  Data:  No results for input(s): NA, K, CL, CO2, BUN, CREATININE, LABGLOM, GLUCOSE, CALCIUM in the last 168 hours. No results for input(s): AST, ALT, ALKPHOS in the last 168 hours.  Invalid input(s): TBILI   No results for input(s): WBC, HGB, HCT, PLT  in the last 168 hours. No results for input(s): APTT, INR in the last 168 hours.       Other tests/results:  EXAM: LUMBAR SPINE - 2-3 VIEW; DG C-ARM 61-120 MIN  COMPARISON:  Lumbar MRI 06/02/2018.  FINDINGS: Fifteen intraoperative fluoroscopic spot views of the lower lumbar spine  demonstrate L4-L5 level posterior and interbody fusion hardware placement, when using the same numbering system as on the comparison MRI.  FLUOROSCOPY TIME:  1 minutes 43 seconds  IMPRESSION: L4-L5 posterior and interbody fusion.  EXAM: CT LUMBAR SPINE WITHOUT CONTRAST  TECHNIQUE: Multidetector CT imaging of the lumbar spine was performed without intravenous contrast administration. Multiplanar CT image reconstructions were also generated.  COMPARISON:  MRI lumbar spine 06/02/2018.  FINDINGS: Segmentation: 5 lumbar type vertebrae.  Alignment: 3 mm anterolisthesis L4-5. Trace retrolisthesis at L1-2, L2-3, and L3-4 compensatory.  Vertebrae: Recent L4-5 TLIF, described below.  Paraspinal and other soft tissues: No extra-spinal hematoma or postoperative fluid collection at the surgical site. Aortic atherosclerosis.  Disc levels:  L1-L2: Chronic disc space narrowing, borderline stenosis. No impingement.  L2-L3: Chronic disc space narrowing. Posterior element hypertrophy. Osseous spurring. Moderate stenosis. No acute findings.  L3-L4: Disc space narrowing. Posterior element hypertrophy. Osseous spurring. Moderate stenosis. No acute findings.  L4-L5: Status post RIGHT L4-5 partial facetectomy and laminectomy for TLIF. Interbody cage appropriately centered, within the interspace. Slight reduction of degenerative spondylolisthesis, now 3 mm compared to 4 mm before surgery. BILATERAL L4 and BILATERAL L5 pedicle screws demonstrate no aberrant placement or medialization. Assessment for canal hematoma is limited by Hounsfield artifact. Facet arthropathy and ligamentum flavum calcification, greater on the LEFT. BILATERAL foraminal narrowing appears stable.  L5-S1: Chronic disc space narrowing. Osseous spurring to the RIGHT. Functional fusion across this interspace. No definite impingement.  IMPRESSION: Status post L4-5 TLIF, with hardware/pedicle screws  appropriately placed and interbody cage centered within the interspace. Slight reduction in degenerative spondylolisthesis, to 3 mm.  Assessment for canal hematoma is limited by Hounsfield artifact. No extra-spinal hematoma or postoperative fluid collection is observed.  EXAM: MRI LUMBAR SPINE WITHOUT CONTRAST  TECHNIQUE: Multiplanar, multisequence MR imaging of the lumbar spine was performed. No intravenous contrast was administered.  COMPARISON:  CT of the lumbar spine 06/30/2018. MRI of the lumbar spine 06/02/2018.  FINDINGS: Segmentation: 5 non rib-bearing lumbar type vertebral bodies are present. The lowest fully formed vertebral body is L5  Alignment: Slight anterolisthesis at L4-5 is fused. Minimal retrolisthesis is again noted at L2-3 and L3-4.  Vertebrae: Schmorl's nodes are present at L2-3 and L3-4. There is scattered fatty infiltration of the thoracolumbar spine. Artifact related to hardware is present at L4 and L5.  Conus medullaris and cauda equina: Conus extends to the L1 level. Conus and cauda equina appear normal.  Paraspinal and other soft tissues: Limited imaging of the abdomen is unremarkable. There is no significant adenopathy. No solid organ lesions are present.  Disc levels:  T12-L1: Negative.  L1-2: A mild broad-based disc protrusion is present. There is prominent epidural fat. This crowds the nerve centrally without focal compression.  L2-3: A broad-based disc protrusion is again seen. Prominent epidural fat is noted. There is crowding of the nerve roots centrally with mild left subarticular stenosis. Mild left foraminal narrowing is also due to disc disease.  L3-4: There is chronic loss of disc height. A broad-based disc protrusion is present. Moderate facet hypertrophy is noted. Prominent epidural fat crowds the nerve roots. Mild left subarticular narrowing is present. There is mild foraminal  stenosis bilaterally without  significant interval change.  L4-5: Right laminectomy is present. There is soft tissue in the posterior epidural space on the right. The thecal sac and nerve roots are displaced posteriorly by what appears to be displaced residual disc material posterior to the spacer. A moderate effusion is present in the left facet joint. Mild left foraminal narrowing is present.  L5-S1: There is fusion anteriorly across the disc space. The foramina are widely patent bilaterally. Prominent epidural fat is noted.  IMPRESSION: 1. Postsurgical changes at L4-5 include right laminectomy. 2. Disc spacer at L4-5 stable. 3. Soft tissue posterior to the disc spacer slightly displaces the thecal sac posteriorly. There is no compressive lesion posterior to the thecal sac. This may create slight deviation of the nerve roots at this level. This may represent some residual disc material posterior to the spacer. There is no hematoma or compressive lesion on the thecal sac. 4. Foramina are patent bilaterally at L4-5. 5. Stable disc disease at L2-3 and L3-4. 6. Extensive epidural lipomatosis.  Assessment/Plan:  Laelle Bridgett is POD5 s/p L4-5 TLIF. Strength has improved but still exhibits weakness in right foot eversion and EHL. Numbness persists with prominence in right toes and lateral foot. External catheter placed. More aggressive management to obtain BM. Advised patient to monitor for sensation with voiding, BM, and passing gas.    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  This has significantly improved over the past 3 days.  - wean dexamethasone - HLIV - mobilize - pain control - DVT prophylaxis - PTOT  Marin Olp PA-C Department of Neurosurgery

## 2018-07-04 NOTE — Progress Notes (Signed)
Clinical Social Worker (CSW) met with patient and made her aware that PT is now recommending CIR. Patient reported that [facility] inpatient rehab is closer to her then [facility]. Patient requested that RN case manager send referral to [place]. Patient is agreeable to D/C to [facility] if [facility] does not accept her. RN case manager aware of above. [place] admissions coordinator at [facility] is aware of above.   [employer], LCSW [phone]

## 2018-07-05 MED ORDER — MAGNESIUM CITRATE PO SOLN
1.0000 | Freq: Once | ORAL | Status: AC | PRN
  Administered 2018-07-05: 1 via ORAL
  Filled 2018-07-05 (×2): qty 296

## 2018-07-05 MED ORDER — BISACODYL 10 MG RE SUPP
10.0000 mg | Freq: Once | RECTAL | Status: DC | PRN
  Filled 2018-07-05: qty 1

## 2018-07-05 NOTE — Progress Notes (Signed)
Procedure: L4-5 TLIF Procedure Date: 06/29/2018 Diagnosis: Lumbar radiculopathy  History: Danielle Taylor is here for L4-5 TLIF for lumbar radiculopathy.  POD6: Continues to feel progressive improvement in strength, movement, and numbness. However, she continues to use external catheter for incontinence. Unable to determine urge to urinate or sensation when she is urinating. Still has not had a bowel movement despite senna and milk of magnesia yesterday. Back pain has worsened to 5-6/10, that she contributes to lack of BM.      POD5: Foley was removed today and then she received OT. When rising, patient experienced incontinence. When she wiped, complained of decreased sensation.  She has not had a bowel movement since surgery. Strength continues to improve, but numbness persists. She is unable to determine if there has been any improvement in sensation. Was able to ambulate to door with PT yesterday. Pain 4/10, adequately controlled with tylenol. Inversion state in right foot has improved.    POD4: She has continued to have improvements in her RLE function.  Pain remains well controlled.  She would like to continue foley until tomorrow.  POD3: Danielle Taylor had some improvement of symptoms yesterday.  She has had slight improvement in her L foot function, which is nearly normal now.  She is able to dorsiflex on the R, but still have eversion weakness.  Her lumbar radiculopathy from preop is improved.  POD2: Overall, she feels symptoms are improving.  She is slowly regaining strength and feet and numbness seems to be improving.   However, she does show concern for leg tremors that have presented following surgery. On further evaluation of groin/buttock numbness, she states this is primarily in the bilateral buttocks but she still has sensation in genitals and rectum.  Continues to work with PT and mobility is very gradually improving.  Back pain still rated 5/10.  Due to presentation of  neurological deficits postoperatively, patient completed CT of the lumbar spine as well as MRI of the lumbar spine.  Dr. Cari Caraway had reviewed these and imaging does not reveal that any additional surgical intervention is recommended at this time.  She was started on high-dose steroids and gabapentin, with continued physical therapy.  External catheter replaced with Foley.  POD1: Complains of numbness in her feet that results in inability to ambulate and stand. Expresses that she had numbness in her feet prior to surgery, but feels that it may be worse especially since she doesn't feel that she can walk because of it.  Also states that yesterday she felt the same in her legs but this has resolved. Denies weakness or pain in lower extremities. Back pain is minimal. Overall pain 5/10. External catheter was placed due to inability to ambulate. Drain output too minimal for recording.   POD0: evaluated while still waking up from anesthesia but able to answer questions and follow most commands.  Pain currently 5/10, most prominent in back. Denies any lower extremity symptoms at this time including pain/numbness/tingling.    Physical Exam: Vitals:   07/04/18 2328 07/05/18 0837  BP: 140/80 135/70  Pulse: 80 79  Resp: 18 18  Temp: 97.7 F (36.5 C) 98.7 F (37.1 C)  SpO2: 90% 94%    AA Ox3 Strength: Left lower extremity: 5 out of 5 iliopsoas, hamstring, quad, plantarflexion, dorsiflexion.  4+/5 EHL.  Inversion and eversion intact. Right lower extremity: 5/5 iliopsoas, hamstring, quad, plantarflexion, dorsiflexion. 4-/5 EHL.  Inversion 5/5,  eversion 3/5 Sensation: Intact and symmetric through lower extremity thighs and calves.  Decreased sensation right lateral foot (more prominent) and toes.  Skin: glue intact. No bleeding, tenderness, warmth  Data:  No results for input(s): NA, K, CL, CO2, BUN, CREATININE, LABGLOM, GLUCOSE, CALCIUM in the last 168 hours. No results for input(s): AST, ALT,  ALKPHOS in the last 168 hours.  Invalid input(s): TBILI   No results for input(s): WBC, HGB, HCT, PLT in the last 168 hours. No results for input(s): APTT, INR in the last 168 hours.       Other tests/results:  EXAM: LUMBAR SPINE - 2-3 VIEW; DG C-ARM 61-120 MIN  COMPARISON:  Lumbar MRI 06/02/2018.  FINDINGS: Fifteen intraoperative fluoroscopic spot views of the lower lumbar spine demonstrate L4-L5 level posterior and interbody fusion hardware placement, when using the same numbering system as on the comparison MRI.  FLUOROSCOPY TIME:  1 minutes 43 seconds  IMPRESSION: L4-L5 posterior and interbody fusion.  EXAM: CT LUMBAR SPINE WITHOUT CONTRAST  TECHNIQUE: Multidetector CT imaging of the lumbar spine was performed without intravenous contrast administration. Multiplanar CT image reconstructions were also generated.  COMPARISON:  MRI lumbar spine 06/02/2018.  FINDINGS: Segmentation: 5 lumbar type vertebrae.  Alignment: 3 mm anterolisthesis L4-5. Trace retrolisthesis at L1-2, L2-3, and L3-4 compensatory.  Vertebrae: Recent L4-5 TLIF, described below.  Paraspinal and other soft tissues: No extra-spinal hematoma or postoperative fluid collection at the surgical site. Aortic atherosclerosis.  Disc levels:  L1-L2: Chronic disc space narrowing, borderline stenosis. No impingement.  L2-L3: Chronic disc space narrowing. Posterior element hypertrophy. Osseous spurring. Moderate stenosis. No acute findings.  L3-L4: Disc space narrowing. Posterior element hypertrophy. Osseous spurring. Moderate stenosis. No acute findings.  L4-L5: Status post RIGHT L4-5 partial facetectomy and laminectomy for TLIF. Interbody cage appropriately centered, within the interspace. Slight reduction of degenerative spondylolisthesis, now 3 mm compared to 4 mm before surgery. BILATERAL L4 and BILATERAL L5 pedicle screws demonstrate no aberrant placement or  medialization. Assessment for canal hematoma is limited by Hounsfield artifact. Facet arthropathy and ligamentum flavum calcification, greater on the LEFT. BILATERAL foraminal narrowing appears stable.  L5-S1: Chronic disc space narrowing. Osseous spurring to the RIGHT. Functional fusion across this interspace. No definite impingement.  IMPRESSION: Status post L4-5 TLIF, with hardware/pedicle screws appropriately placed and interbody cage centered within the interspace. Slight reduction in degenerative spondylolisthesis, to 3 mm.  Assessment for canal hematoma is limited by Hounsfield artifact. No extra-spinal hematoma or postoperative fluid collection is observed.  EXAM: MRI LUMBAR SPINE WITHOUT CONTRAST  TECHNIQUE: Multiplanar, multisequence MR imaging of the lumbar spine was performed. No intravenous contrast was administered.  COMPARISON:  CT of the lumbar spine 06/30/2018. MRI of the lumbar spine 06/02/2018.  FINDINGS: Segmentation: 5 non rib-bearing lumbar type vertebral bodies are present. The lowest fully formed vertebral body is L5  Alignment: Slight anterolisthesis at L4-5 is fused. Minimal retrolisthesis is again noted at L2-3 and L3-4.  Vertebrae: Schmorl's nodes are present at L2-3 and L3-4. There is scattered fatty infiltration of the thoracolumbar spine. Artifact related to hardware is present at L4 and L5.  Conus medullaris and cauda equina: Conus extends to the L1 level. Conus and cauda equina appear normal.  Paraspinal and other soft tissues: Limited imaging of the abdomen is unremarkable. There is no significant adenopathy. No solid organ lesions are present.  Disc levels:  T12-L1: Negative.  L1-2: A mild broad-based disc protrusion is present. There is prominent epidural fat. This crowds the nerve centrally without focal compression.  L2-3: A broad-based disc protrusion is again seen. Prominent  epidural fat is noted. There is  crowding of the nerve roots centrally with mild left subarticular stenosis. Mild left foraminal narrowing is also due to disc disease.  L3-4: There is chronic loss of disc height. A broad-based disc protrusion is present. Moderate facet hypertrophy is noted. Prominent epidural fat crowds the nerve roots. Mild left subarticular narrowing is present. There is mild foraminal stenosis bilaterally without significant interval change.  L4-5: Right laminectomy is present. There is soft tissue in the posterior epidural space on the right. The thecal sac and nerve roots are displaced posteriorly by what appears to be displaced residual disc material posterior to the spacer. A moderate effusion is present in the left facet joint. Mild left foraminal narrowing is present.  L5-S1: There is fusion anteriorly across the disc space. The foramina are widely patent bilaterally. Prominent epidural fat is noted.  IMPRESSION: 1. Postsurgical changes at L4-5 include right laminectomy. 2. Disc spacer at L4-5 stable. 3. Soft tissue posterior to the disc spacer slightly displaces the thecal sac posteriorly. There is no compressive lesion posterior to the thecal sac. This may create slight deviation of the nerve roots at this level. This may represent some residual disc material posterior to the spacer. There is no hematoma or compressive lesion on the thecal sac. 4. Foramina are patent bilaterally at L4-5. 5. Stable disc disease at L2-3 and L3-4. 6. Extensive epidural lipomatosis.  Assessment/Plan:  Danielle Taylor is POD6 s/p L4-5 TLIF. Strength continue to improved, but still exhibits weakness in right foot eversion and EHL. Numbness persists with prominence lateral foot and toes but she feels sensation is improving in toes. Continue external catheter. Per Dr. Izora Ribas urology consult placed for evaluation of neurogenic bladder. Started magnesium citrate, and if necessary, will administer  suppository to achieve BM.    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  This has significantly improved over the past 3 days.  - wean dexamethasone - HLIV - mobilize - pain control - DVT prophylaxis - PTOT  Marin Olp PA-C Department of Neurosurgery

## 2018-07-05 NOTE — Progress Notes (Signed)
OT Cancellation Note  Patient Details Name: Danielle Taylor MRN: [id number] DOB: December 14, 1945   Cancelled Treatment:    Reason Eval/Treat Not Completed: Other (comment). OT tx attempted this am. Pt with RN. Pt eager to have BM and coordinating with RN to facilitate BM, manage pain, and for other medications. Per pt, spouse bringing disposable undergarments when he comes to visit this am. Pt requests hold OT tx this am and re-attempt in PM after spouse brings undergarments so she can practice LB dressing with OT. Will re-attempt in pm.   Jeni Salles, MPH, MS, OTR/L ascom [phone] 07/05/18, 9:17 AM

## 2018-07-05 NOTE — Care Management (Signed)
RNCM reached out to [place] at [facility] [phone] fax [phone]). She said that she did not get a chance to read over notes faxed from CM yesterday but will call this RNCM back in an hour. They may be able to accept patient tomorrow or Thursday pending authorization.

## 2018-07-05 NOTE — Progress Notes (Signed)
Physical Therapy Treatment Patient Details Name: Danielle Taylor MRN: [id number] DOB: 1946-07-04 Today's Date: 07/05/2018    History of Present Illness 38/yo female pt s/p L4-5 TLIF. On 11/7 after surgery pt with new onset urinary incontinence and diminished sensation and strength BLEs.  CT scan and MRI completed and pt cleared to continue therapy.  PMHx includes lumbar radiculopathy, ashtma, COPD, HTN, CAD, depression, Graves disease, and HLD.   PT Comments    Pt limited by pain this session and ambulatory distance limited by mild dizziness.  Pt ambulated 18 ft before onset of mild dizziness which improved once sitting.  Completed therapeutic exercises in sitting targeting R quad strength for improved gait mechanics and safety.  Pt instructed to continue with sitting exercises through the day.  Inpatient remains appropriate d/c plan.    Follow Up Recommendations  (inpatient rehab (prefer [facility] as they live in [place]))     Equipment Recommendations  Rolling walker with 5" wheels    Recommendations for Other Services       Precautions / Restrictions Precautions Precautions: Back;Fall Precaution Comments: pt able to recall 100% of back precautions without further instruction Restrictions Weight Bearing Restrictions: No    Mobility  Bed Mobility Overal bed mobility: Needs Assistance Bed Mobility: Rolling;Sidelying to Sit Rolling: Min guard Sidelying to sit: Min guard       General bed mobility comments: Pt does not roll completely over before progressing to sidelying to sit. Cues provided for proper technique.    Transfers Overall transfer level: Needs assistance Equipment used: Rolling walker (2 wheeled) Transfers: Sit to/from Stand Sit to Stand: Min assist         General transfer comment: Min assist to remain steady with sit<>stand.  Pt requires verbal cues for proper hand placement and safe technique.  Ambulation/Gait Ambulation/Gait assistance: Min  assist Gait Distance (Feet): 18 Feet Assistive device: Rolling walker (2 wheeled) Gait Pattern/deviations: Step-to pattern;Antalgic;Decreased step length - right;Decreased step length - left Gait velocity: decreased   General Gait Details: Pt reports mild izziness after ambulating 18 ft and thus was instructed in sitting in chair.  Deferred further ambulation due to dizziness.    Stairs             Wheelchair Mobility    Modified Rankin (Stroke Patients Only)       Balance Overall balance assessment: Needs assistance Sitting-balance support: No upper extremity supported;Feet supported Sitting balance-Leahy Scale: Good     Standing balance support: Bilateral upper extremity supported;During functional activity Standing balance-Leahy Scale: Poor Standing balance comment: Pt relies on BUE support for static and dynamic activities                            Cognition Arousal/Alertness: Awake/alert Behavior During Therapy: WFL for tasks assessed/performed Overall Cognitive Status: Within Functional Limits for tasks assessed                                        Exercises General Exercises - Lower Extremity Ankle Circles/Pumps: AROM;Both;10 reps;Seated Other Exercises Other Exercises: LAQ RLE x15  Other Exercises: LAQ with 5 second holds x10 with cues to activate R quad at terminal E Other Exercises: Manually resisted LAQ x10    General Comments General comments (skin integrity, edema, etc.): Pt reports mild dizziness after ambulating 18 ft.  She believes her dizziness is  from her pain which is higher this morning.        Pertinent Vitals/Pain Pain Assessment: Faces Faces Pain Scale: Hurts whole lot Pain Location: lower back Pain Descriptors / Indicators: Discomfort;Aching;Grimacing Pain Intervention(s): Limited activity within patient's tolerance;Monitored during session;Repositioned;Utilized relaxation techniques    Home Living                       Prior Function            PT Goals (current goals can now be found in the care plan section) Acute Rehab PT Goals Patient Stated Goal: to be able to walk PT Goal Formulation: With patient Time For Goal Achievement: 07/14/18 Potential to Achieve Goals: Fair Progress towards PT goals: Progressing toward goals    Frequency    BID      PT Plan Current plan remains appropriate    Co-evaluation              AM-PAC PT "6 Clicks" Daily Activity  Outcome Measure  Difficulty turning over in bed (including adjusting bedclothes, sheets and blankets)?: A Little Difficulty moving from lying on back to sitting on the side of the bed? : A Little Difficulty sitting down on and standing up from a chair with arms (e.g., wheelchair, bedside commode, etc,.)?: Unable Help needed moving to and from a bed to chair (including a wheelchair)?: A Little Help needed walking in hospital room?: A Little Help needed climbing 3-5 steps with a railing? : Total 6 Click Score: 14    End of Session Equipment Utilized During Treatment: Gait belt Activity Tolerance: Patient limited by pain(limited by dizziness) Patient left: in chair;with call bell/phone within reach;with chair alarm set Nurse Communication: Mobility status;Other (comment)(pt's mild dizziness) PT Visit Diagnosis: Muscle weakness (generalized) (M62.81);Pain;Unsteadiness on feet (R26.81);Difficulty in walking, not elsewhere classified (R26.2);Other symptoms and signs involving the nervous system (R29.898) Pain - Right/Left: (lower) Pain - part of body: (back)     Time: 0936-1000 PT Time Calculation (min) (ACUTE ONLY): 24 min  Charges:  $Gait Training: 8-22 mins $Therapeutic Exercise: 8-22 mins                     Collie Siad PT, DPT 07/05/2018, 11:09 AM

## 2018-07-05 NOTE — Progress Notes (Signed)
Physical Therapy Treatment Patient Details Name: Danielle Taylor MRN: [id number] DOB: 1945-11-25 Today's Date: 07/05/2018    History of Present Illness 38/yo female pt s/p L4-5 TLIF. On 11/7 after surgery pt with new onset urinary incontinence and diminished sensation and strength BLEs.  CT scan and MRI completed and pt cleared to continue therapy.  PMHx includes lumbar radiculopathy, ashtma, COPD, HTN, CAD, depression, Graves disease, and HLD.    PT Comments    Pt continues to make good progress with mobility, ambulating 45 ft this session, demonstrating improved ambulatory endurance.  Cues for gait mechanics provided.  Pt continues to demonstrate poor safety awareness with stand>sit transfers and requires min assist and verbal cues for proper technique.  Inpatient Rehab remains most appropriate d/c plan at this time.     Follow Up Recommendations  (inpatient rehab (prefer [facility] as they live in [place]))     Equipment Recommendations  Rolling walker with 5" wheels    Recommendations for Other Services       Precautions / Restrictions Precautions Precautions: Back;Fall Restrictions Weight Bearing Restrictions: No    Mobility  Bed Mobility               General bed mobility comments: Pt sitting in chair at start and end of session  Transfers Overall transfer level: Needs assistance Equipment used: Rolling walker (2 wheeled) Transfers: Sit to/from Stand Sit to Stand: Min assist         General transfer comment: Pt stood from chair x2.  On first stand>sit pt demonstrates unsteadiness and posterior LOB, but was able to stabilize by reaching back to armrest and with min assist.  On second stand>sit pt requires verbal cues to reach back for armrest.   Ambulation/Gait Ambulation/Gait assistance: Min assist Gait Distance (Feet): 45 Feet Assistive device: Rolling walker (2 wheeled) Gait Pattern/deviations: Step-to pattern;Antalgic;Decreased step length -  right;Decreased step length - left Gait velocity: decreased   General Gait Details: Cues to point toes forward when ambulating as pt tends to demonstrate IR.  Pt requires 2 standing rest breaks due to fatigue and LBP.  No dizziness this session.     Stairs             Wheelchair Mobility    Modified Rankin (Stroke Patients Only)       Balance Overall balance assessment: Needs assistance Sitting-balance support: No upper extremity supported;Feet supported Sitting balance-Leahy Scale: Good     Standing balance support: Bilateral upper extremity supported;During functional activity Standing balance-Leahy Scale: Poor Standing balance comment: Pt relies on BUE support for static and dynamic activities                            Cognition Arousal/Alertness: Awake/alert Behavior During Therapy: WFL for tasks assessed/performed Overall Cognitive Status: Within Functional Limits for tasks assessed                                        Exercises Other Exercises Other Exercises: Standing R TKE with RW for support with yellow theraband.  x15 Other Exercises: Staggered stance quad sets with R foot in front x10 and R foot behind x10 with cues for proper technique.     General Comments        Pertinent Vitals/Pain Pain Assessment: Faces Faces Pain Scale: Hurts whole lot Pain Location: lower back Pain Descriptors /  Indicators: Discomfort;Aching;Grimacing Pain Intervention(s): Limited activity within patient's tolerance;Premedicated before session;Monitored during session    Home Living                      Prior Function            PT Goals (current goals can now be found in the care plan section) Acute Rehab PT Goals Patient Stated Goal: to become more independent PT Goal Formulation: With patient Time For Goal Achievement: 07/14/18 Potential to Achieve Goals: Good Progress towards PT goals: Progressing toward goals     Frequency    BID      PT Plan Current plan remains appropriate    Co-evaluation              AM-PAC PT "6 Clicks" Daily Activity  Outcome Measure  Difficulty turning over in bed (including adjusting bedclothes, sheets and blankets)?: A Little Difficulty moving from lying on back to sitting on the side of the bed? : A Little Difficulty sitting down on and standing up from a chair with arms (e.g., wheelchair, bedside commode, etc,.)?: Unable Help needed moving to and from a bed to chair (including a wheelchair)?: A Little Help needed walking in hospital room?: A Little Help needed climbing 3-5 steps with a railing? : Total 6 Click Score: 14    End of Session Equipment Utilized During Treatment: Gait belt Activity Tolerance: Patient limited by pain;Patient limited by fatigue Patient left: in chair;with call bell/phone within reach;with chair alarm set Nurse Communication: Mobility status PT Visit Diagnosis: Muscle weakness (generalized) (M62.81);Pain;Unsteadiness on feet (R26.81);Difficulty in walking, not elsewhere classified (R26.2);Other symptoms and signs involving the nervous system (R29.898) Pain - Right/Left: (lower) Pain - part of body: (back)     Time: 9774-1423 PT Time Calculation (min) (ACUTE ONLY): 31 min  Charges:  $Gait Training: 23-37 mins                     Collie Siad PT, DPT 07/05/2018, 5:08 PM

## 2018-07-05 NOTE — Progress Notes (Signed)
Occupational Therapy Treatment Patient Details Name: Danielle Taylor MRN: [id number] DOB: 1946/04/11 Today's Date: 07/05/2018    History of present illness 57/yo female pt s/p L4-5 TLIF. On 11/7 after surgery pt with new onset urinary incontinence and diminished sensation and strength BLEs.  CT scan and MRI completed and pt cleared to continue therapy.  PMHx includes lumbar radiculopathy, ashtma, COPD, HTN, CAD, depression, Graves disease, and HLD.   OT comments  Pt tolerated treatment well. Pt was able to recall all spinal precautions this date. OT provided education including demonstration of how to use AE to comply with precautions during performance of ADLs for which pt was able to recall sequence using teach back method. Pt performs LB dressing requring MOD A for clothing management over hips with brief. Pt stands x2 to perform this task with FWW and MIN A as well as one safety cue to not pull up on walker when performing sit to stand. Pt returned to chair with chair alarm activated and call bell within reach and her husband returning to pt room. D/c plans remain appropriate at this time.    Follow Up Recommendations  SNF    Equipment Recommendations  3 in 1 bedside commode;Tub/shower seat;Other (comment)    Recommendations for Other Services      Precautions / Restrictions Precautions Precautions: Back;Fall Precaution Comments: pt able to recall 100% of back precautions at this time Restrictions Weight Bearing Restrictions: No       Mobility Bed Mobility               General bed mobility comments: Pt in chair when OT presents for tx.   Transfers Overall transfer level: Needs assistance Equipment used: Rolling walker (2 wheeled) Transfers: Sit to/from Stand Sit to Stand: Min assist         General transfer comment: Pt stood from chair x2 to manage LB clothing over hips, demos slight posterior LOB, but is able to self correct with MIN A.     Balance  Overall balance assessment: Needs assistance Sitting-balance support: No upper extremity supported;Feet supported Sitting balance-Leahy Scale: Good Sitting balance - Comments: Pt able to sit EOB without UE support   Standing balance support: Bilateral upper extremity supported;During functional activity Standing balance-Leahy Scale: Poor Standing balance comment: Pt relies on BUE support from [place] for safety during dressing tasks                           ADL either performed or assessed with clinical judgement   ADL Overall ADL's : Needs assistance/impaired                     Lower Body Dressing: Sit to/from stand;Moderate assistance Lower Body Dressing Details (indicate cue type and reason): Pt performs sit to stand from recliner x2 with FWW with MOD A for clothing management over hips with brief. In seated position, pt is able to thread with reacher although this requires increased time.                      Vision Baseline Vision/History: Wears glasses Wears Glasses: Reading only Patient Visual Report: No change from baseline     Perception     Praxis      Cognition Arousal/Alertness: Awake/alert Behavior During Therapy: WFL for tasks assessed/performed Overall Cognitive Status: Within Functional Limits for tasks assessed  Exercises Exercises: Other exercises Other Exercises Other Exercises: Pt recalls back precautions with no verbal cues.  Other Exercises: Pt educated on use of AE to maintain precautions for LB dressing, demonstrates and verbalized understanding.    Shoulder Instructions       General Comments      Pertinent Vitals/ Pain       Pain Assessment: Faces Faces Pain Scale: Hurts even more Pain Location: lower back Pain Descriptors / Indicators: Discomfort;Aching;Grimacing Pain Intervention(s): Monitored during session;Limited activity within patient's  tolerance  Home Living                                          Prior Functioning/Environment              Frequency  Min 2X/week        Progress Toward Goals  OT Goals(current goals can now be found in the care plan section)  Progress towards OT goals: Progressing toward goals  Acute Rehab OT Goals Patient Stated Goal: to become more independent OT Goal Formulation: With patient Time For Goal Achievement: 07/14/18 Potential to Achieve Goals: Good  Plan Frequency remains appropriate;Discharge plan remains appropriate    Co-evaluation                 AM-PAC PT "6 Clicks" Daily Activity     Outcome Measure   Help from another person eating meals?: None Help from another person taking care of personal grooming?: None Help from another person toileting, which includes using toliet, bedpan, or urinal?: A Little Help from another person bathing (including washing, rinsing, drying)?: A Lot Help from another person to put on and taking off regular upper body clothing?: None Help from another person to put on and taking off regular lower body clothing?: A Lot 6 Click Score: 19    End of Session Equipment Utilized During Treatment: Gait belt;Rolling walker  OT Visit Diagnosis: Other abnormalities of gait and mobility (R26.89);Muscle weakness (generalized) (M62.81) Pain - Right/Left: Right Pain - part of body: Leg   Activity Tolerance Patient tolerated treatment well   Patient Left with call bell/phone within reach;with family/visitor present;in chair;with chair alarm set   Nurse Communication (Pt requesting break from SCD's temporarily-reported to RN)        Time: 6203-5597 OT Time Calculation (min): 33 min  Charges: OT General Charges $OT Visit: 1 Visit OT Treatments $Self Care/Home Management : 8-22 mins  Gerrianne Scale, MS, OTR/L ascom [phone] or [phone] 07/05/18, 6:55 PM

## 2018-07-06 MED ORDER — DEXAMETHASONE 2 MG PO TABS: 2.0000 mg | ORAL_TABLET | Freq: Every day | ORAL | 0 refills | Status: AC

## 2018-07-06 MED ORDER — METHOCARBAMOL 500 MG PO TABS: 500.0000 mg | ORAL_TABLET | Freq: Four times a day (QID) | ORAL | 0 refills | Status: DC | PRN

## 2018-07-06 MED ORDER — GABAPENTIN 300 MG PO CAPS: 300.0000 mg | ORAL_CAPSULE | Freq: Three times a day (TID) | ORAL | 0 refills | Status: DC

## 2018-07-06 MED ORDER — DEXAMETHASONE 1 MG PO TABS: 1.0000 mg | ORAL_TABLET | Freq: Every day | ORAL | 0 refills | Status: AC

## 2018-07-06 NOTE — Progress Notes (Signed)
Per RN case manager patient will D/C to inpatient rehab at [facility]. [place] admissions coordinator at [facility] is aware of above. Please reconsult if future social work needs arise. CSW signing off.   [employer], LCSW [phone]

## 2018-07-06 NOTE — Discharge Summary (Signed)
Procedure: L4-5 TLIF Procedure Date: 06/29/2018 Diagnosis: Lumbar radiculopathy  History: Danielle Taylor is here for L4-5 TLIF for lumbar radiculopathy.  POD7: Improvement in toe numbness, although right lateral foot remain numb. Bowel movement x2 last night. Back pain and abdominal discomfort improved with BM. Continues to use external catheter. Was able to ambulate even farther with PT today.   POD6: Continues to feel progressive improvement in strength, movement, and numbness. However, she continues to use external catheter for incontinence. Unable to determine urge to urinate or sensation when she is urinating. Still has not had a bowel movement despite senna and milk of magnesia yesterday. Back pain has worsened to 5-6/10, that she contributes to lack of BM.      POD5: Foley was removed today and then she received OT. When rising, patient experienced incontinence. When she wiped, complained of decreased sensation.  She has not had a bowel movement since surgery. Strength continues to improve, but numbness persists. She is unable to determine if there has been any improvement in sensation. Was able to ambulate to door with PT yesterday. Pain 4/10, adequately controlled with tylenol. Inversion state in right foot has improved.    POD4: She has continued to have improvements in her RLE function.  Pain remains well controlled.  She would like to continue foley until tomorrow.  POD3: Danielle Taylor had some improvement of symptoms yesterday.  She has had slight improvement in her L foot function, which is nearly normal now.  She is able to dorsiflex on the R, but still have eversion weakness.  Her lumbar radiculopathy from preop is improved.  POD2: Overall, she feels symptoms are improving.  She is slowly regaining strength and feet and numbness seems to be improving.   However, she does show concern for leg tremors that have presented following surgery. On further evaluation of groin/buttock  numbness, she states this is primarily in the bilateral buttocks but she still has sensation in genitals and rectum.  Continues to work with PT and mobility is very gradually improving.  Back pain still rated 5/10.  Due to presentation of neurological deficits postoperatively, patient completed CT of the lumbar spine as well as MRI of the lumbar spine.  Dr. Cari Caraway had reviewed these and imaging does not reveal that any additional surgical intervention is recommended at this time.  She was started on high-dose steroids and gabapentin, with continued physical therapy.  External catheter replaced with Foley.  POD1: Complains of numbness in her feet that results in inability to ambulate and stand. Expresses that she had numbness in her feet prior to surgery, but feels that it may be worse especially since she doesn't feel that she can walk because of it.  Also states that yesterday she felt the same in her legs but this has resolved. Denies weakness or pain in lower extremities. Back pain is minimal. Overall pain 5/10. External catheter was placed due to inability to ambulate. Drain output too minimal for recording.   POD0: evaluated while still waking up from anesthesia but able to answer questions and follow most commands.  Pain currently 5/10, most prominent in back. Denies any lower extremity symptoms at this time including pain/numbness/tingling.    Physical Exam: Vitals:   07/06/18 0035 07/06/18 0907  BP: 122/83 106/66  Pulse: 79 78  Resp: 16 18  Temp: (!) 97.4 F (36.3 C) 97.7 F (36.5 C)  SpO2: 95% 97%    AA Ox3 Strength: Left lower extremity: 5 out of 5  iliopsoas, hamstring, quad, plantarflexion, dorsiflexion, EHL.  Inversion and eversion intact. Right lower extremity: 5/5 iliopsoas, hamstring, quad, plantarflexion, dorsiflexion. 4/5 EHL.  Inversion 5/5,  eversion 3/5 Sensation: Intact and symmetric through lower extremity thighs and calves.  Decreased sensation right lateral foot.  Sensation improvement in toes.  Skin: glue intact. No bleeding, tenderness, warmth, drainage.   Data:  No results for input(s): NA, K, CL, CO2, BUN, CREATININE, LABGLOM, GLUCOSE, CALCIUM in the last 168 hours. No results for input(s): AST, ALT, ALKPHOS in the last 168 hours.  Invalid input(s): TBILI   No results for input(s): WBC, HGB, HCT, PLT in the last 168 hours. No results for input(s): APTT, INR in the last 168 hours.       Other tests/results:  EXAM: LUMBAR SPINE - 2-3 VIEW; DG C-ARM 61-120 MIN  COMPARISON:  Lumbar MRI 06/02/2018.  FINDINGS: Fifteen intraoperative fluoroscopic spot views of the lower lumbar spine demonstrate L4-L5 level posterior and interbody fusion hardware placement, when using the same numbering system as on the comparison MRI.  FLUOROSCOPY TIME:  1 minutes 43 seconds  IMPRESSION: L4-L5 posterior and interbody fusion.  EXAM: CT LUMBAR SPINE WITHOUT CONTRAST  TECHNIQUE: Multidetector CT imaging of the lumbar spine was performed without intravenous contrast administration. Multiplanar CT image reconstructions were also generated.  COMPARISON:  MRI lumbar spine 06/02/2018.  FINDINGS: Segmentation: 5 lumbar type vertebrae.  Alignment: 3 mm anterolisthesis L4-5. Trace retrolisthesis at L1-2, L2-3, and L3-4 compensatory.  Vertebrae: Recent L4-5 TLIF, described below.  Paraspinal and other soft tissues: No extra-spinal hematoma or postoperative fluid collection at the surgical site. Aortic atherosclerosis.  Disc levels:  L1-L2: Chronic disc space narrowing, borderline stenosis. No impingement.  L2-L3: Chronic disc space narrowing. Posterior element hypertrophy. Osseous spurring. Moderate stenosis. No acute findings.  L3-L4: Disc space narrowing. Posterior element hypertrophy. Osseous spurring. Moderate stenosis. No acute findings.  L4-L5: Status post RIGHT L4-5 partial facetectomy and laminectomy for TLIF. Interbody  cage appropriately centered, within the interspace. Slight reduction of degenerative spondylolisthesis, now 3 mm compared to 4 mm before surgery. BILATERAL L4 and BILATERAL L5 pedicle screws demonstrate no aberrant placement or medialization. Assessment for canal hematoma is limited by Hounsfield artifact. Facet arthropathy and ligamentum flavum calcification, greater on the LEFT. BILATERAL foraminal narrowing appears stable.  L5-S1: Chronic disc space narrowing. Osseous spurring to the RIGHT. Functional fusion across this interspace. No definite impingement.  IMPRESSION: Status post L4-5 TLIF, with hardware/pedicle screws appropriately placed and interbody cage centered within the interspace. Slight reduction in degenerative spondylolisthesis, to 3 mm.  Assessment for canal hematoma is limited by Hounsfield artifact. No extra-spinal hematoma or postoperative fluid collection is observed.  EXAM: MRI LUMBAR SPINE WITHOUT CONTRAST  TECHNIQUE: Multiplanar, multisequence MR imaging of the lumbar spine was performed. No intravenous contrast was administered.  COMPARISON:  CT of the lumbar spine 06/30/2018. MRI of the lumbar spine 06/02/2018.  FINDINGS: Segmentation: 5 non rib-bearing lumbar type vertebral bodies are present. The lowest fully formed vertebral body is L5  Alignment: Slight anterolisthesis at L4-5 is fused. Minimal retrolisthesis is again noted at L2-3 and L3-4.  Vertebrae: Schmorl's nodes are present at L2-3 and L3-4. There is scattered fatty infiltration of the thoracolumbar spine. Artifact related to hardware is present at L4 and L5.  Conus medullaris and cauda equina: Conus extends to the L1 level. Conus and cauda equina appear normal.  Paraspinal and other soft tissues: Limited imaging of the abdomen is unremarkable. There is no significant adenopathy. No solid organ  lesions are present.  Disc levels:  T12-L1: Negative.  L1-2: A mild  broad-based disc protrusion is present. There is prominent epidural fat. This crowds the nerve centrally without focal compression.  L2-3: A broad-based disc protrusion is again seen. Prominent epidural fat is noted. There is crowding of the nerve roots centrally with mild left subarticular stenosis. Mild left foraminal narrowing is also due to disc disease.  L3-4: There is chronic loss of disc height. A broad-based disc protrusion is present. Moderate facet hypertrophy is noted. Prominent epidural fat crowds the nerve roots. Mild left subarticular narrowing is present. There is mild foraminal stenosis bilaterally without significant interval change.  L4-5: Right laminectomy is present. There is soft tissue in the posterior epidural space on the right. The thecal sac and nerve roots are displaced posteriorly by what appears to be displaced residual disc material posterior to the spacer. A moderate effusion is present in the left facet joint. Mild left foraminal narrowing is present.  L5-S1: There is fusion anteriorly across the disc space. The foramina are widely patent bilaterally. Prominent epidural fat is noted.  IMPRESSION: 1. Postsurgical changes at L4-5 include right laminectomy. 2. Disc spacer at L4-5 stable. 3. Soft tissue posterior to the disc spacer slightly displaces the thecal sac posteriorly. There is no compressive lesion posterior to the thecal sac. This may create slight deviation of the nerve roots at this level. This may represent some residual disc material posterior to the spacer. There is no hematoma or compressive lesion on the thecal sac. 4. Foramina are patent bilaterally at L4-5. 5. Stable disc disease at L2-3 and L3-4. 6. Extensive epidural lipomatosis.  Assessment/Plan:  Danielle Taylor is POD7 s/p L4-5 TLIF. She continues to improve daily. Accepted at [facility] rehab for continued improvement in symptoms. Medically stable for transfer. Will  contact [place] [facility] for evaluation of urinary incontinence issues.  Continue dexamethasone tapering and lovenox. Restart Xarelto 14 days following procedure. She is scheduled to follow up in clinic 07/14/2018 to monitor progress.  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  This has significantly improved over the past 3 days.  Marin Olp PA-C Department of Neurosurgery

## 2018-07-06 NOTE — Plan of Care (Signed)
  Problem: Health Behavior/Discharge Planning: Goal: Ability to manage health-related needs will improve Outcome: Progressing   Problem: Clinical Measurements: Goal: Ability to maintain clinical measurements within normal limits will improve Outcome: Progressing Goal: Will remain free from infection Outcome: Progressing Goal: Diagnostic test results will improve Outcome: Progressing Goal: Respiratory complications will improve Outcome: Progressing Goal: Cardiovascular complication will be avoided Outcome: Progressing   Problem: Activity: Goal: Risk for activity intolerance will decrease Outcome: Progressing   Problem: Elimination: Goal: Will not experience complications related to bowel motility Outcome: Progressing Goal: Will not experience complications related to urinary retention Outcome: Progressing   Problem: Pain Managment: Goal: General experience of comfort will improve Outcome: Progressing   Problem: Safety: Goal: Ability to remain free from injury will improve Outcome: Progressing   Problem: Skin Integrity: Goal: Risk for impaired skin integrity will decrease Outcome: Progressing   Problem: Education: Goal: Ability to verbalize activity precautions or restrictions will improve Outcome: Progressing Goal: Knowledge of the prescribed therapeutic regimen will improve Outcome: Progressing Goal: Understanding of discharge needs will improve Outcome: Progressing   Problem: Activity: Goal: Ability to avoid complications of mobility impairment will improve Outcome: Progressing Goal: Ability to tolerate increased activity will improve Outcome: Progressing Goal: Will remain free from falls Outcome: Progressing   Problem: Bowel/Gastric: Goal: Gastrointestinal status for postoperative course will improve Outcome: Progressing   Problem: Clinical Measurements: Goal: Ability to maintain clinical measurements within normal limits will improve Outcome:  Progressing Goal: Postoperative complications will be avoided or minimized Outcome: Progressing Goal: Diagnostic test results will improve Outcome: Progressing   Problem: Pain Management: Goal: Pain level will decrease Outcome: Progressing   Problem: Skin Integrity: Goal: Will show signs of wound healing Outcome: Progressing   Problem: Health Behavior/Discharge Planning: Goal: Identification of resources available to assist in meeting health care needs will improve Outcome: Progressing   Problem: Bladder/Genitourinary: Goal: Urinary functional status for postoperative course will improve Outcome: Progressing

## 2018-07-06 NOTE — Care Management (Signed)
TC to Dr.Yarbrough. He states patient is ready for DC.He will be around in  approximately 10 minutes to complete paper work.

## 2018-07-06 NOTE — Care Management Important Message (Signed)
Important Message  Patient Details  Name: Danielle Taylor MRN: [id number] Date of Birth: 1946/07/25   Medicare Important Message Given:  Yes    [name] A Colyn Miron 07/06/2018, 10:26 AM

## 2018-07-06 NOTE — Progress Notes (Signed)
Physical Therapy Treatment Patient Details Name: Danielle Taylor MRN: [id number] DOB: Nov 12, 1945 Today's Date: 07/06/2018    History of Present Illness 60/yo female pt s/p L4-5 TLIF. On 11/7 after surgery pt with new onset urinary incontinence and diminished sensation and strength BLEs.  CT scan and MRI completed and pt cleared to continue therapy.  PMHx includes lumbar radiculopathy, ashtma, COPD, HTN, CAD, depression, Graves disease, and HLD.    PT Comments    Pt was able to perform AROM R ankle eversion (near full ROM) this session and thus pt was instructed in therapeutic exercise to further strengthen eversion.  Pt demonstrated safe technique with sit<>stand this session without cues. Inpatient rehab remains most appropriate d/c plan at this time.    Follow Up Recommendations  (inpatient rehab (prefer [facility] as they live in [place]))     Equipment Recommendations  Rolling walker with 5" wheels    Recommendations for Other Services       Precautions / Restrictions Precautions Precautions: Back;Fall Restrictions Weight Bearing Restrictions: No    Mobility  Bed Mobility Overal bed mobility: Needs Assistance Bed Mobility: Rolling;Sidelying to Sit Rolling: Min guard Sidelying to sit: Min guard     Sit to sidelying: Min guard General bed mobility comments: Pt rolls too far when performing log roll to achieve sitting EOB.  Requires verbal cues to correct before pushing up from sidelying.  Pt has difficulty bringing LEs into bed to return to supine.   Transfers Overall transfer level: Needs assistance Equipment used: Rolling walker (2 wheeled) Transfers: Sit to/from Stand Sit to Stand: Min guard         General transfer comment: Pt performed sit>stand from bed with proper and safe technique.  Pt remembered to reach back for armrests when preparing to sit this session.   Ambulation/Gait Ambulation/Gait assistance: Min assist Gait Distance (Feet): 60  Feet Assistive device: Rolling walker (2 wheeled) Gait Pattern/deviations: Antalgic;Decreased step length - right;Decreased step length - left;Step-through pattern Gait velocity: decreased   General Gait Details: Cues to keep toes pointed forward and to increase step length Bil to normalize gait pattern.  Pt requires 2 standing rest breaks due to back pain and fatigue.     Stairs             Wheelchair Mobility    Modified Rankin (Stroke Patients Only)       Balance Overall balance assessment: Needs assistance Sitting-balance support: No upper extremity supported;Feet supported Sitting balance-Leahy Scale: Good     Standing balance support: Bilateral upper extremity supported;During functional activity Standing balance-Leahy Scale: Poor Standing balance comment: Pt relies on BUE support for static and dynamic activities                            Cognition Arousal/Alertness: Awake/alert Behavior During Therapy: WFL for tasks assessed/performed Overall Cognitive Status: Within Functional Limits for tasks assessed                                        Exercises General Exercises - Lower Extremity Long Arc Quad: Strengthening;Right;10 reps;Seated(with manual resistance; including resisted R knee F) Other Exercises Other Exercises: Seated R knee E with 15 second holds x8. Other Exercises: Seated R ankle eversion AROM x10  Other Exercises: Seated with R foot on floor, eversion to achieve R foot flat.  Pt instructedi n  holding hand on R knee to ensure no compensatory hip IR.  x10.  Encouraged pt complete throughout the day.     General Comments        Pertinent Vitals/Pain Pain Assessment: Faces Faces Pain Scale: Hurts even more Pain Location: lower back Pain Descriptors / Indicators: Discomfort;Aching;Grimacing Pain Intervention(s): Limited activity within patient's tolerance;Monitored during session;Utilized relaxation techniques     Home Living                      Prior Function            PT Goals (current goals can now be found in the care plan section) Acute Rehab PT Goals Patient Stated Goal: to become more independent PT Goal Formulation: With patient Time For Goal Achievement: 07/14/18 Potential to Achieve Goals: Good Progress towards PT goals: Progressing toward goals    Frequency    BID      PT Plan Current plan remains appropriate    Co-evaluation              AM-PAC PT "6 Clicks" Daily Activity  Outcome Measure  Difficulty turning over in bed (including adjusting bedclothes, sheets and blankets)?: A Little Difficulty moving from lying on back to sitting on the side of the bed? : A Little Difficulty sitting down on and standing up from a chair with arms (e.g., wheelchair, bedside commode, etc,.)?: A Lot Help needed moving to and from a bed to chair (including a wheelchair)?: A Little Help needed walking in hospital room?: A Little Help needed climbing 3-5 steps with a railing? : Total 6 Click Score: 15    End of Session Equipment Utilized During Treatment: Gait belt Activity Tolerance: Patient limited by fatigue Patient left: with call bell/phone within reach;in bed;with bed alarm set;with SCD's reapplied Nurse Communication: Mobility status PT Visit Diagnosis: Muscle weakness (generalized) (M62.81);Pain;Unsteadiness on feet (R26.81);Difficulty in walking, not elsewhere classified (R26.2);Other symptoms and signs involving the nervous system (R29.898) Pain - Right/Left: (lower) Pain - part of body: (back)     Time: 7654-6503 PT Time Calculation (min) (ACUTE ONLY): 43 min  Charges:  $Gait Training: 8-22 mins $Therapeutic Exercise: 8-22 mins $Therapeutic Activity: 8-22 mins                     Collie Siad PT, DPT 07/06/2018, 10:59 AM

## 2018-07-06 NOTE — Progress Notes (Signed)
[facility] called for transport.

## 2018-07-06 NOTE — Progress Notes (Signed)
Report given to [facility].

## 2018-07-06 NOTE — Care Management (Signed)
TC to [place] at [facility]. She states they do have a bed today for patient. Danielle Taylor has spoken with patient and patient is agreeable to transfer. TC to Marin Olp, [place]. LM regarding bed availability, requested a return call.

## 2018-07-06 NOTE — Discharge Instructions (Signed)
NEUROSURGERY DISCHARGE INSTRUCTIONS  Admission Diagnosis: Lumbar radiculopathy  Discharge Diagnosis: Lumbar radiculopathy  Operative procedure: L4-5 TLIF  The following are instructions to help in your recovery once you have been discharged from the hospital. Even if you feel well, it is important that you follow these activity guidelines. If you do not let your neck heal properly from the surgery, you can increase the chance of return of your symptoms and other complications.   What to do after you leave the hospital:  Recommended diet:  Increase protein intake to promote wound healing. You may return to your usual diet. However, you may experience discomfort when swallowing in the first month after your surgery. This is normal. You may find that softer foods are more comfortable for you to swallow. Be sure to stay hydrated.   Recommended activity: No bending, lifting, or twisting (BLT). Avoid lifting objects heavier than 10 pounds (gallon milk jug). Where possible, avoid household activities that involve lifting, bending, reaching, pushing, or pulling such as laundry, vacuuming, grocery shopping, and childcare. Try to arrange for help from friends and family for these activities while your back heals.   Increase physical activity slowly as tolerated. Taking short walks is encouraged, but avoid strenuous exercise. Do not jog, run, bicycle, lift weights, or participate in any other exercises unless specifically allowed by your doctor.   You should not drive until cleared by your doctor.   Until released by your doctor, you should not return to work or school. You should rest at home and let your body heal.   You may shower the day after your surgery. After showering, lightly dab your incision dry. Do not take a tub bath or go swimming until approved by your doctor at your follow-up appointment.   If you smoke, we strongly recommend that you quit. Smoking has been proven to interfere with  normal bone healing and will dramatically reduce the success rate of your surgery. Please contact [facility] (800-QUIT-NOW) and use the resources at www.[facility].com for assistance in stopping smoking.   Medications  Do not restart Aspirin until seven days after surgery  * Do not take anti-inflammatory medications for 3 months after surgery (naproxen [Aleve], ibuprofen [Advil, Motrin], celecoxib [Celebrex], etc.). These medications can prevent your bones from healing properly.   You may restart home medications.   Wound Care Instructions  If you have a dressing on your incision, remove it two days after your surgery. Keep your incision area clean and dry.   If you have staples or stitches on your incision, you should have a follow up scheduled for removal. If you do not have staples or stitches, you will have steri-strips (small pieces of surgical tape) or Dermabond glue. The steri-strips/glue should begin to peel away within about a week (it is fine if the steri-strips fall off before then). If the strips are still in place one week after your surgery, you may gently remove them.    Please Report any of the following: You may experience pain in your neck and/or pain between your shoulder blades. This is normal and should improve in the next few weeks with the help of pain medication, muscle relaxers, and rest. Some patients report that a warm compress on the back of the neck or between the shoulder blades helps.   However, should you experience any of the following, contact us immediately:   New numbness or weakness   Pain that is progressively getting worse, and is not relieved by your pain  medication, muscle relaxers, rest, and warm compresses   Bleeding, redness, swelling, pain, or drainage from surgical incision   Chills or flu-like symptoms   Fever greater than 101.0 F (38.3 C)   Inability to eat, drink fluids, or take medications   Problems with bowel or bladder functions     Difficulty breathing or shortness of breath   Warmth, tenderness, or swelling in your calf    Additional Follow up appointments During office hours (Monday-Friday 9 am to 5 pm), please call your physician at [phone]  After hours and weekends, please call the [facility] at  [phone] and ask for the Neurosurgeon On Call   For a life-threatening emergency, call 911    Please see below for scheduled appointments:

## 2018-07-06 NOTE — Progress Notes (Signed)
This nurse received call from [facility], Case Manager. Pt will not be transported to [facility] until tomorrow. EMS cancelled and pt made aware.

## 2018-07-07 DIAGNOSIS — R0902 Hypoxemia: Secondary | ICD-10-CM | POA: Diagnosis not present

## 2018-07-07 DIAGNOSIS — J9601 Acute respiratory failure with hypoxia: Secondary | ICD-10-CM | POA: Diagnosis not present

## 2018-07-07 DIAGNOSIS — Z87891 Personal history of nicotine dependence: Secondary | ICD-10-CM | POA: Diagnosis not present

## 2018-07-07 DIAGNOSIS — R52 Pain, unspecified: Secondary | ICD-10-CM | POA: Diagnosis present

## 2018-07-07 DIAGNOSIS — Z7901 Long term (current) use of anticoagulants: Secondary | ICD-10-CM | POA: Diagnosis not present

## 2018-07-07 DIAGNOSIS — I495 Sick sinus syndrome: Secondary | ICD-10-CM | POA: Diagnosis present

## 2018-07-07 DIAGNOSIS — J849 Interstitial pulmonary disease, unspecified: Secondary | ICD-10-CM | POA: Diagnosis not present

## 2018-07-07 DIAGNOSIS — F339 Major depressive disorder, recurrent, unspecified: Secondary | ICD-10-CM | POA: Diagnosis present

## 2018-07-07 DIAGNOSIS — Z950 Presence of cardiac pacemaker: Secondary | ICD-10-CM | POA: Diagnosis not present

## 2018-07-07 DIAGNOSIS — R9431 Abnormal electrocardiogram [ECG] [EKG]: Secondary | ICD-10-CM | POA: Diagnosis not present

## 2018-07-07 DIAGNOSIS — J432 Centrilobular emphysema: Secondary | ICD-10-CM | POA: Diagnosis not present

## 2018-07-07 DIAGNOSIS — R1110 Vomiting, unspecified: Secondary | ICD-10-CM | POA: Diagnosis not present

## 2018-07-07 DIAGNOSIS — R0602 Shortness of breath: Secondary | ICD-10-CM | POA: Diagnosis not present

## 2018-07-07 DIAGNOSIS — K5900 Constipation, unspecified: Secondary | ICD-10-CM | POA: Diagnosis present

## 2018-07-07 DIAGNOSIS — R531 Weakness: Secondary | ICD-10-CM | POA: Diagnosis present

## 2018-07-07 DIAGNOSIS — M5417 Radiculopathy, lumbosacral region: Secondary | ICD-10-CM | POA: Diagnosis present

## 2018-07-07 DIAGNOSIS — M961 Postlaminectomy syndrome, not elsewhere classified: Secondary | ICD-10-CM | POA: Diagnosis not present

## 2018-07-07 DIAGNOSIS — R112 Nausea with vomiting, unspecified: Secondary | ICD-10-CM | POA: Diagnosis not present

## 2018-07-07 DIAGNOSIS — J440 Chronic obstructive pulmonary disease with acute lower respiratory infection: Secondary | ICD-10-CM | POA: Diagnosis not present

## 2018-07-07 DIAGNOSIS — I480 Paroxysmal atrial fibrillation: Secondary | ICD-10-CM | POA: Diagnosis present

## 2018-07-07 DIAGNOSIS — J441 Chronic obstructive pulmonary disease with (acute) exacerbation: Secondary | ICD-10-CM | POA: Diagnosis not present

## 2018-07-07 DIAGNOSIS — J189 Pneumonia, unspecified organism: Secondary | ICD-10-CM | POA: Diagnosis not present

## 2018-07-07 DIAGNOSIS — I10 Essential (primary) hypertension: Secondary | ICD-10-CM | POA: Diagnosis not present

## 2018-07-07 DIAGNOSIS — Z981 Arthrodesis status: Secondary | ICD-10-CM | POA: Diagnosis not present

## 2018-07-07 DIAGNOSIS — I110 Hypertensive heart disease with heart failure: Secondary | ICD-10-CM | POA: Diagnosis present

## 2018-07-07 DIAGNOSIS — I509 Heart failure, unspecified: Secondary | ICD-10-CM | POA: Diagnosis present

## 2018-07-07 DIAGNOSIS — Z4789 Encounter for other orthopedic aftercare: Secondary | ICD-10-CM | POA: Diagnosis not present

## 2018-07-07 DIAGNOSIS — I442 Atrioventricular block, complete: Secondary | ICD-10-CM | POA: Diagnosis not present

## 2018-07-07 DIAGNOSIS — R05 Cough: Secondary | ICD-10-CM | POA: Diagnosis not present

## 2018-07-07 DIAGNOSIS — M549 Dorsalgia, unspecified: Secondary | ICD-10-CM | POA: Diagnosis not present

## 2018-07-07 DIAGNOSIS — R32 Unspecified urinary incontinence: Secondary | ICD-10-CM | POA: Diagnosis present

## 2018-07-07 DIAGNOSIS — M48061 Spinal stenosis, lumbar region without neurogenic claudication: Secondary | ICD-10-CM | POA: Diagnosis not present

## 2018-07-07 DIAGNOSIS — Z9181 History of falling: Secondary | ICD-10-CM | POA: Diagnosis not present

## 2018-07-07 NOTE — Care Management (Signed)
RNCM called primary RN yesterday at 440pm and EMS had not picked patient up for transport to [place] yet. Patient was to be picked up by 3 pm for [facility] could take her. RNCM cancelled transport and rescheduled for today at 11 am. Spoke with Wilfred Curtis at [facility] this am and confirmed that patient would be transported by 11 am this morning. Spoke with patients primary RN and requested she schedule transport no later than 11 am this morning. Notified Marin Olp, NP with Dr. Izora Ribas, MD.

## 2018-07-07 NOTE — Progress Notes (Signed)
Physical Therapy Treatment Patient Details Name: Danielle Taylor MRN: [id number] DOB: 20-Nov-1945 Today's Date: 07/07/2018    History of Present Illness 40/yo female pt s/p L4-5 TLIF. On 11/7 after surgery pt with new onset urinary incontinence and diminished sensation and strength BLEs.  CT scan and MRI completed and pt cleared to continue therapy.  PMHx includes lumbar radiculopathy, ashtma, COPD, HTN, CAD, depression, Graves disease, and HLD.   PT Comments    Mrs. Burgo was agreeable to work with therapy but was limited by LBP.  Thus, deferred ambulation this session due to pain.  Pt completed therapeutic exercises in sitting. Pt reports having new decreased sensation posterior Bil thighs and sharp pain in Bil feet/toes (RN notified who send message to MD).  This PT screened pt for DVT with Homan's sign and squeeze test which was negative.  Pt does report TTP plantar/distal surface of Bil feet.  Follow up recommendations remain appropriate at this time.    Follow Up Recommendations  (inpatient rehab (prefer [facility] as they live in [place]))     Equipment Recommendations  Rolling walker with 5" wheels    Recommendations for Other Services       Precautions / Restrictions Precautions Precautions: Back;Fall Restrictions Weight Bearing Restrictions: No    Mobility  Bed Mobility Overal bed mobility: Needs Assistance Bed Mobility: Rolling;Sidelying to Sit Rolling: Min guard Sidelying to sit: Min guard       General bed mobility comments: Pt demonstrated proper technique with log roll this session but does still require close min guard assist for safety.    Transfers Overall transfer level: Needs assistance Equipment used: Rolling walker (2 wheeled) Transfers: Sit to/from [facility] Sit to Stand: Min guard         General transfer comment: Pt performed sit>stand with proper and safe technique without cues this session.   Ambulation/Gait              General Gait Details: Pt declined due to increased pain in her back this morning.    Stairs             Wheelchair Mobility    Modified Rankin (Stroke Patients Only)       Balance Overall balance assessment: Needs assistance Sitting-balance support: No upper extremity supported;Feet supported Sitting balance-Leahy Scale: Good     Standing balance support: Bilateral upper extremity supported;During functional activity Standing balance-Leahy Scale: Poor Standing balance comment: Pt relies on BUE support for static and dynamic activities                            Cognition Arousal/Alertness: Awake/alert Behavior During Therapy: WFL for tasks assessed/performed Overall Cognitive Status: Within Functional Limits for tasks assessed                                        Exercises Other Exercises Other Exercises: Seated resisted LAQ x10  Other Exercises: LAQ with 5 second hold x10 Other Exercises: Seated L knee eversion with foot flat on floor and hand on L knee to ensure no compensatory behavior x10 Other Exercises: Seated TA activation x10 with cues for proper technique    General Comments        Pertinent Vitals/Pain Pain Assessment: Faces Faces Pain Scale: Hurts whole lot Pain Location: lower back and spontaneous sharp pain in Bil feet Pain Descriptors /  Indicators: Discomfort;Aching;Grimacing;Sharp Pain Intervention(s): Limited activity within patient's tolerance;Monitored during session;Repositioned;Premedicated before session;Utilized relaxation techniques;Other (comment)(introduced TA activation exercise for pain relief)    Home Living                      Prior Function            PT Goals (current goals can now be found in the care plan section) Acute Rehab PT Goals Patient Stated Goal: to become more independent PT Goal Formulation: With patient Time For Goal Achievement: 07/14/18 Potential to Achieve  Goals: Good Progress towards PT goals: Not progressing toward goals - comment(due to back pain)    Frequency    BID      PT Plan Current plan remains appropriate    Co-evaluation              AM-PAC PT "6 Clicks" Daily Activity  Outcome Measure  Difficulty turning over in bed (including adjusting bedclothes, sheets and blankets)?: A Little Difficulty moving from lying on back to sitting on the side of the bed? : A Little Difficulty sitting down on and standing up from a chair with arms (e.g., wheelchair, bedside commode, etc,.)?: A Lot Help needed moving to and from a bed to chair (including a wheelchair)?: A Little Help needed walking in hospital room?: A Little Help needed climbing 3-5 steps with a railing? : Total 6 Click Score: 15    End of Session Equipment Utilized During Treatment: Gait belt Activity Tolerance: Patient limited by pain Patient left: with call bell/phone within reach;in chair;with chair alarm set Nurse Communication: Mobility status PT Visit Diagnosis: Muscle weakness (generalized) (M62.81);Pain;Unsteadiness on feet (R26.81);Difficulty in walking, not elsewhere classified (R26.2);Other symptoms and signs involving the nervous system (R29.898) Pain - Right/Left: (lower) Pain - part of body: (back)     Time: 1030-1056 PT Time Calculation (min) (ACUTE ONLY): 26 min  Charges:  $Therapeutic Exercise: 8-22 mins $Therapeutic Activity: 8-22 mins                     Session was performed by student PT, Belva Crome, and directed, overseen, and documented by this PT.  Collie Siad PT, DPT  07/07/2018, 11:14 AM

## 2018-07-11 ENCOUNTER — Other Ambulatory Visit: Payer: Self-pay | Admitting: *Deleted

## 2018-07-11 MED ORDER — LORATADINE 10 MG PO TABS
10.00 | ORAL_TABLET | ORAL | Status: DC
Start: 2018-07-12 — End: 2018-07-11

## 2018-07-11 MED ORDER — GEMFIBROZIL 600 MG PO TABS
600.00 | ORAL_TABLET | ORAL | Status: DC
Start: 2018-07-12 — End: 2018-07-11

## 2018-07-11 MED ORDER — OXYCODONE HCL 5 MG PO TABS
5.00 | ORAL_TABLET | ORAL | Status: DC
Start: ? — End: 2018-07-11

## 2018-07-11 MED ORDER — THERA PO TABS
1.00 | ORAL_TABLET | ORAL | Status: DC
Start: 2018-07-12 — End: 2018-07-11

## 2018-07-11 MED ORDER — POLYETHYLENE GLYCOL 3350 17 G PO PACK
17.00 | PACK | ORAL | Status: DC
Start: ? — End: 2018-07-11

## 2018-07-11 MED ORDER — LOSARTAN POTASSIUM 50 MG PO TABS
50.00 | ORAL_TABLET | ORAL | Status: DC
Start: 2018-07-12 — End: 2018-07-11

## 2018-07-11 MED ORDER — ENOXAPARIN SODIUM 40 MG/0.4ML ~~LOC~~ SOLN
40.00 | SUBCUTANEOUS | Status: DC
Start: 2018-07-12 — End: 2018-07-11

## 2018-07-11 MED ORDER — GABAPENTIN 300 MG PO CAPS
300.00 | ORAL_CAPSULE | ORAL | Status: DC
Start: 2018-07-11 — End: 2018-07-11

## 2018-07-11 MED ORDER — ATORVASTATIN CALCIUM 20 MG PO TABS
20.00 | ORAL_TABLET | ORAL | Status: DC
Start: 2018-07-12 — End: 2018-07-11

## 2018-07-11 MED ORDER — SENNOSIDES-DOCUSATE SODIUM 8.6-50 MG PO TABS
2.00 | ORAL_TABLET | ORAL | Status: DC
Start: 2018-07-11 — End: 2018-07-11

## 2018-07-11 MED ORDER — ACETAMINOPHEN 500 MG PO TABS
1000.00 | ORAL_TABLET | ORAL | Status: DC
Start: 2018-07-11 — End: 2018-07-11

## 2018-07-11 MED ORDER — CITALOPRAM HYDROBROMIDE 20 MG PO TABS
40.00 | ORAL_TABLET | ORAL | Status: DC
Start: 2018-07-12 — End: 2018-07-11

## 2018-07-11 MED ORDER — MONTELUKAST SODIUM 10 MG PO TABS
10.00 | ORAL_TABLET | ORAL | Status: DC
Start: 2018-07-11 — End: 2018-07-11

## 2018-07-11 MED ORDER — DOXYCYCLINE MONOHYDRATE 100 MG PO TABS
100.00 | ORAL_TABLET | ORAL | Status: DC
Start: 2018-07-11 — End: 2018-07-11

## 2018-07-11 MED ORDER — METHOCARBAMOL 500 MG PO TABS
500.00 | ORAL_TABLET | ORAL | Status: DC
Start: ? — End: 2018-07-11

## 2018-07-11 MED ORDER — GENERIC EXTERNAL MEDICATION
125.00 | Status: DC
Start: 2018-07-12 — End: 2018-07-11

## 2018-07-11 MED ORDER — RIVAROXABAN 10 MG PO TABS
20.00 | ORAL_TABLET | ORAL | Status: DC
Start: 2018-07-13 — End: 2018-07-11

## 2018-07-11 MED ORDER — UMECLIDINIUM-VILANTEROL 62.5-25 MCG/INH IN AEPB
1.00 | INHALATION_SPRAY | RESPIRATORY_TRACT | Status: DC
Start: 2018-07-12 — End: 2018-07-11

## 2018-07-11 MED ORDER — ONDANSETRON HCL 4 MG PO TABS
4.00 | ORAL_TABLET | ORAL | Status: DC
Start: ? — End: 2018-07-11

## 2018-07-11 MED ORDER — GUAIFENESIN ER 600 MG PO TB12
600.00 | ORAL_TABLET | ORAL | Status: DC
Start: 2018-07-11 — End: 2018-07-11

## 2018-07-11 MED ORDER — METOPROLOL SUCCINATE ER 50 MG PO TB24
50.00 | ORAL_TABLET | ORAL | Status: DC
Start: 2018-07-12 — End: 2018-07-11

## 2018-07-11 NOTE — Patient Outreach (Signed)
[facility]) Care Management  07/11/2018  Milderd Manocchio [facility] 12/08/1945 [id number]   EMMI-general discharge [place] Day # 1 Date: 07/09/18 Saturday 1453  Red Alert Reason: got discharge papers? No  Know who to call about changes in condition? No    Insurance: medicare  [facility] admissions x 1 ED visits x 0 in the last 6 months    Outreach attempt # 1, successful Patient is able to verify HIPAA [facility] Care Management RN reviewed and addressed red alert with patient Danielle Taylor reports she was not d/c home but to [facility] sn[facility] since 07/07/18, therefore, her answers are correct She did not get d/c papers but papers sent to facility for admission She is being seen by staff and MD at the facility    Consent: [facility] RN CM reviewed [facility] services with patient. Patient gave verbal consent for services.   Advised patient that there will be further automated EMMI- post discharge calls to assess how the patient is doing following the recent hospitalization Advised the patient that another call may be received from a nurse if any of their responses were abnormal. Patient voiced understanding and was appreciative of f/u call.   Plan: [facility] RN CM will [facility] RN CM will close this case as theMrs Debellis is enrolled in an external program [facility] RN CM will updated [facility] CMA via in basket of pt not discharging home and to get EMMI calls discontinued   Danielle L. Lavina Hamman, RN, BSN, [place] Coordinator Office number [phone] Mobile number [phone]  Main [facility] number [phone] Fax number [phone]

## 2018-07-26 ENCOUNTER — Other Ambulatory Visit: Payer: Self-pay | Admitting: Internal Medicine

## 2018-07-26 ENCOUNTER — Ambulatory Visit
Admission: RE | Admit: 2018-07-26 | Discharge: 2018-07-26 | Disposition: A | Discharge status: Home / Self Care | Payer: Medicare Other | Source: Ambulatory Visit | Attending: Internal Medicine | Admitting: Internal Medicine

## 2018-07-26 DIAGNOSIS — R0600 Dyspnea, unspecified: Secondary | ICD-10-CM | POA: Diagnosis not present

## 2018-07-26 DIAGNOSIS — Y6552 Performance of procedure (operation) on patient not scheduled for surgery: Secondary | ICD-10-CM

## 2018-07-27 DIAGNOSIS — I2510 Atherosclerotic heart disease of native coronary artery without angina pectoris: Secondary | ICD-10-CM | POA: Diagnosis not present

## 2018-07-27 DIAGNOSIS — E785 Hyperlipidemia, unspecified: Secondary | ICD-10-CM | POA: Diagnosis not present

## 2018-07-27 DIAGNOSIS — Z87891 Personal history of nicotine dependence: Secondary | ICD-10-CM | POA: Diagnosis not present

## 2018-07-27 DIAGNOSIS — J432 Centrilobular emphysema: Secondary | ICD-10-CM | POA: Diagnosis not present

## 2018-07-27 DIAGNOSIS — Z9181 History of falling: Secondary | ICD-10-CM | POA: Diagnosis not present

## 2018-07-27 DIAGNOSIS — Z981 Arthrodesis status: Secondary | ICD-10-CM | POA: Diagnosis not present

## 2018-07-27 DIAGNOSIS — Z6839 Body mass index (BMI) 39.0-39.9, adult: Secondary | ICD-10-CM | POA: Diagnosis not present

## 2018-07-27 DIAGNOSIS — Z4789 Encounter for other orthopedic aftercare: Secondary | ICD-10-CM | POA: Diagnosis not present

## 2018-07-27 DIAGNOSIS — E039 Hypothyroidism, unspecified: Secondary | ICD-10-CM | POA: Diagnosis not present

## 2018-07-27 DIAGNOSIS — I480 Paroxysmal atrial fibrillation: Secondary | ICD-10-CM | POA: Diagnosis not present

## 2018-07-27 DIAGNOSIS — E669 Obesity, unspecified: Secondary | ICD-10-CM | POA: Diagnosis not present

## 2018-07-27 DIAGNOSIS — M5416 Radiculopathy, lumbar region: Secondary | ICD-10-CM | POA: Diagnosis not present

## 2018-07-27 DIAGNOSIS — F339 Major depressive disorder, recurrent, unspecified: Secondary | ICD-10-CM | POA: Diagnosis not present

## 2018-07-27 DIAGNOSIS — I495 Sick sinus syndrome: Secondary | ICD-10-CM | POA: Diagnosis not present

## 2018-07-27 DIAGNOSIS — I119 Hypertensive heart disease without heart failure: Secondary | ICD-10-CM | POA: Diagnosis not present

## 2018-07-27 DIAGNOSIS — J302 Other seasonal allergic rhinitis: Secondary | ICD-10-CM | POA: Diagnosis not present

## 2018-07-27 DIAGNOSIS — M48061 Spinal stenosis, lumbar region without neurogenic claudication: Secondary | ICD-10-CM | POA: Diagnosis not present

## 2018-07-27 DIAGNOSIS — J849 Interstitial pulmonary disease, unspecified: Secondary | ICD-10-CM | POA: Diagnosis not present

## 2018-07-27 DIAGNOSIS — I509 Heart failure, unspecified: Secondary | ICD-10-CM | POA: Diagnosis not present

## 2018-07-27 DIAGNOSIS — Z950 Presence of cardiac pacemaker: Secondary | ICD-10-CM | POA: Diagnosis not present

## 2018-07-27 DIAGNOSIS — I442 Atrioventricular block, complete: Secondary | ICD-10-CM | POA: Diagnosis not present

## 2018-07-29 DIAGNOSIS — M48061 Spinal stenosis, lumbar region without neurogenic claudication: Secondary | ICD-10-CM | POA: Diagnosis not present

## 2018-07-29 DIAGNOSIS — I509 Heart failure, unspecified: Secondary | ICD-10-CM | POA: Diagnosis not present

## 2018-07-29 DIAGNOSIS — I119 Hypertensive heart disease without heart failure: Secondary | ICD-10-CM | POA: Diagnosis not present

## 2018-07-29 DIAGNOSIS — Z4789 Encounter for other orthopedic aftercare: Secondary | ICD-10-CM | POA: Diagnosis not present

## 2018-07-29 DIAGNOSIS — M5416 Radiculopathy, lumbar region: Secondary | ICD-10-CM | POA: Diagnosis not present

## 2018-07-29 DIAGNOSIS — I480 Paroxysmal atrial fibrillation: Secondary | ICD-10-CM | POA: Diagnosis not present

## 2018-08-02 DIAGNOSIS — M48061 Spinal stenosis, lumbar region without neurogenic claudication: Secondary | ICD-10-CM | POA: Diagnosis not present

## 2018-08-02 DIAGNOSIS — I509 Heart failure, unspecified: Secondary | ICD-10-CM | POA: Diagnosis not present

## 2018-08-02 DIAGNOSIS — I119 Hypertensive heart disease without heart failure: Secondary | ICD-10-CM | POA: Diagnosis not present

## 2018-08-02 DIAGNOSIS — I480 Paroxysmal atrial fibrillation: Secondary | ICD-10-CM | POA: Diagnosis not present

## 2018-08-02 DIAGNOSIS — M5416 Radiculopathy, lumbar region: Secondary | ICD-10-CM | POA: Diagnosis not present

## 2018-08-02 DIAGNOSIS — Z4789 Encounter for other orthopedic aftercare: Secondary | ICD-10-CM | POA: Diagnosis not present

## 2018-08-03 ENCOUNTER — Other Ambulatory Visit: Payer: Self-pay | Admitting: Family Medicine

## 2018-08-04 DIAGNOSIS — M4316 Spondylolisthesis, lumbar region: Secondary | ICD-10-CM | POA: Diagnosis not present

## 2018-08-04 DIAGNOSIS — Z981 Arthrodesis status: Secondary | ICD-10-CM | POA: Diagnosis not present

## 2018-08-05 ENCOUNTER — Telehealth: Payer: Self-pay

## 2018-08-05 DIAGNOSIS — Z4789 Encounter for other orthopedic aftercare: Secondary | ICD-10-CM | POA: Diagnosis not present

## 2018-08-05 DIAGNOSIS — I119 Hypertensive heart disease without heart failure: Secondary | ICD-10-CM | POA: Diagnosis not present

## 2018-08-05 DIAGNOSIS — M48061 Spinal stenosis, lumbar region without neurogenic claudication: Secondary | ICD-10-CM | POA: Diagnosis not present

## 2018-08-05 DIAGNOSIS — I480 Paroxysmal atrial fibrillation: Secondary | ICD-10-CM | POA: Diagnosis not present

## 2018-08-05 DIAGNOSIS — M5416 Radiculopathy, lumbar region: Secondary | ICD-10-CM | POA: Diagnosis not present

## 2018-08-05 DIAGNOSIS — I509 Heart failure, unspecified: Secondary | ICD-10-CM | POA: Diagnosis not present

## 2018-08-05 MED ORDER — NITROFURANTOIN MONOHYD MACRO 100 MG PO CAPS: 100.0000 mg | ORAL_CAPSULE | Freq: Two times a day (BID) | ORAL | 0 refills | Status: DC

## 2018-08-05 NOTE — Telephone Encounter (Signed)
Pt called in stating she had back surgery and can't get out, but has a UTI. Wants something called in to [facility]- sent Macrobid x 3 days in.

## 2018-08-08 DIAGNOSIS — I480 Paroxysmal atrial fibrillation: Secondary | ICD-10-CM | POA: Diagnosis not present

## 2018-08-08 DIAGNOSIS — Z4789 Encounter for other orthopedic aftercare: Secondary | ICD-10-CM | POA: Diagnosis not present

## 2018-08-08 DIAGNOSIS — M48061 Spinal stenosis, lumbar region without neurogenic claudication: Secondary | ICD-10-CM | POA: Diagnosis not present

## 2018-08-08 DIAGNOSIS — I119 Hypertensive heart disease without heart failure: Secondary | ICD-10-CM | POA: Diagnosis not present

## 2018-08-08 DIAGNOSIS — M5416 Radiculopathy, lumbar region: Secondary | ICD-10-CM | POA: Diagnosis not present

## 2018-08-08 DIAGNOSIS — I509 Heart failure, unspecified: Secondary | ICD-10-CM | POA: Diagnosis not present

## 2018-08-10 DIAGNOSIS — M5416 Radiculopathy, lumbar region: Secondary | ICD-10-CM | POA: Diagnosis not present

## 2018-08-10 DIAGNOSIS — I480 Paroxysmal atrial fibrillation: Secondary | ICD-10-CM | POA: Diagnosis not present

## 2018-08-10 DIAGNOSIS — Z4789 Encounter for other orthopedic aftercare: Secondary | ICD-10-CM | POA: Diagnosis not present

## 2018-08-10 DIAGNOSIS — I119 Hypertensive heart disease without heart failure: Secondary | ICD-10-CM | POA: Diagnosis not present

## 2018-08-10 DIAGNOSIS — I509 Heart failure, unspecified: Secondary | ICD-10-CM | POA: Diagnosis not present

## 2018-08-10 DIAGNOSIS — M48061 Spinal stenosis, lumbar region without neurogenic claudication: Secondary | ICD-10-CM | POA: Diagnosis not present

## 2018-08-15 DIAGNOSIS — I480 Paroxysmal atrial fibrillation: Secondary | ICD-10-CM | POA: Diagnosis not present

## 2018-08-15 DIAGNOSIS — M48061 Spinal stenosis, lumbar region without neurogenic claudication: Secondary | ICD-10-CM | POA: Diagnosis not present

## 2018-08-15 DIAGNOSIS — Z4789 Encounter for other orthopedic aftercare: Secondary | ICD-10-CM | POA: Diagnosis not present

## 2018-08-15 DIAGNOSIS — I119 Hypertensive heart disease without heart failure: Secondary | ICD-10-CM | POA: Diagnosis not present

## 2018-08-15 DIAGNOSIS — M5416 Radiculopathy, lumbar region: Secondary | ICD-10-CM | POA: Diagnosis not present

## 2018-08-15 DIAGNOSIS — I509 Heart failure, unspecified: Secondary | ICD-10-CM | POA: Diagnosis not present

## 2018-08-18 DIAGNOSIS — I509 Heart failure, unspecified: Secondary | ICD-10-CM | POA: Diagnosis not present

## 2018-08-18 DIAGNOSIS — Z4789 Encounter for other orthopedic aftercare: Secondary | ICD-10-CM | POA: Diagnosis not present

## 2018-08-18 DIAGNOSIS — I480 Paroxysmal atrial fibrillation: Secondary | ICD-10-CM | POA: Diagnosis not present

## 2018-08-18 DIAGNOSIS — M48061 Spinal stenosis, lumbar region without neurogenic claudication: Secondary | ICD-10-CM | POA: Diagnosis not present

## 2018-08-18 DIAGNOSIS — M5416 Radiculopathy, lumbar region: Secondary | ICD-10-CM | POA: Diagnosis not present

## 2018-08-18 DIAGNOSIS — I119 Hypertensive heart disease without heart failure: Secondary | ICD-10-CM | POA: Diagnosis not present

## 2018-08-22 DIAGNOSIS — I509 Heart failure, unspecified: Secondary | ICD-10-CM | POA: Diagnosis not present

## 2018-08-22 DIAGNOSIS — I480 Paroxysmal atrial fibrillation: Secondary | ICD-10-CM | POA: Diagnosis not present

## 2018-08-22 DIAGNOSIS — M5416 Radiculopathy, lumbar region: Secondary | ICD-10-CM | POA: Diagnosis not present

## 2018-08-22 DIAGNOSIS — M48061 Spinal stenosis, lumbar region without neurogenic claudication: Secondary | ICD-10-CM | POA: Diagnosis not present

## 2018-08-22 DIAGNOSIS — Z4789 Encounter for other orthopedic aftercare: Secondary | ICD-10-CM | POA: Diagnosis not present

## 2018-08-22 DIAGNOSIS — I119 Hypertensive heart disease without heart failure: Secondary | ICD-10-CM | POA: Diagnosis not present

## 2018-08-24 ENCOUNTER — Other Ambulatory Visit: Payer: Self-pay | Admitting: Family Medicine

## 2018-08-24 DIAGNOSIS — F32A Depression, unspecified: Secondary | ICD-10-CM

## 2018-08-24 DIAGNOSIS — F329 Major depressive disorder, single episode, unspecified: Secondary | ICD-10-CM

## 2018-08-25 DIAGNOSIS — I480 Paroxysmal atrial fibrillation: Secondary | ICD-10-CM | POA: Diagnosis not present

## 2018-08-25 DIAGNOSIS — Z4789 Encounter for other orthopedic aftercare: Secondary | ICD-10-CM | POA: Diagnosis not present

## 2018-08-25 DIAGNOSIS — I509 Heart failure, unspecified: Secondary | ICD-10-CM | POA: Diagnosis not present

## 2018-08-25 DIAGNOSIS — I119 Hypertensive heart disease without heart failure: Secondary | ICD-10-CM | POA: Diagnosis not present

## 2018-08-25 DIAGNOSIS — M5416 Radiculopathy, lumbar region: Secondary | ICD-10-CM | POA: Diagnosis not present

## 2018-08-25 DIAGNOSIS — M48061 Spinal stenosis, lumbar region without neurogenic claudication: Secondary | ICD-10-CM | POA: Diagnosis not present

## 2018-08-30 DIAGNOSIS — I119 Hypertensive heart disease without heart failure: Secondary | ICD-10-CM | POA: Diagnosis not present

## 2018-08-30 DIAGNOSIS — Z4789 Encounter for other orthopedic aftercare: Secondary | ICD-10-CM | POA: Diagnosis not present

## 2018-08-30 DIAGNOSIS — M5416 Radiculopathy, lumbar region: Secondary | ICD-10-CM | POA: Diagnosis not present

## 2018-08-30 DIAGNOSIS — I509 Heart failure, unspecified: Secondary | ICD-10-CM | POA: Diagnosis not present

## 2018-08-30 DIAGNOSIS — I480 Paroxysmal atrial fibrillation: Secondary | ICD-10-CM | POA: Diagnosis not present

## 2018-08-30 DIAGNOSIS — M48061 Spinal stenosis, lumbar region without neurogenic claudication: Secondary | ICD-10-CM | POA: Diagnosis not present

## 2018-09-01 DIAGNOSIS — M48061 Spinal stenosis, lumbar region without neurogenic claudication: Secondary | ICD-10-CM | POA: Diagnosis not present

## 2018-09-01 DIAGNOSIS — I119 Hypertensive heart disease without heart failure: Secondary | ICD-10-CM | POA: Diagnosis not present

## 2018-09-01 DIAGNOSIS — I509 Heart failure, unspecified: Secondary | ICD-10-CM | POA: Diagnosis not present

## 2018-09-01 DIAGNOSIS — Z4789 Encounter for other orthopedic aftercare: Secondary | ICD-10-CM | POA: Diagnosis not present

## 2018-09-01 DIAGNOSIS — I480 Paroxysmal atrial fibrillation: Secondary | ICD-10-CM | POA: Diagnosis not present

## 2018-09-01 DIAGNOSIS — M5416 Radiculopathy, lumbar region: Secondary | ICD-10-CM | POA: Diagnosis not present

## 2018-09-15 DIAGNOSIS — Z981 Arthrodesis status: Secondary | ICD-10-CM | POA: Diagnosis not present

## 2018-09-15 DIAGNOSIS — M4316 Spondylolisthesis, lumbar region: Secondary | ICD-10-CM | POA: Diagnosis not present

## 2018-09-15 DIAGNOSIS — M4326 Fusion of spine, lumbar region: Secondary | ICD-10-CM | POA: Diagnosis not present

## 2018-09-16 DIAGNOSIS — Z950 Presence of cardiac pacemaker: Secondary | ICD-10-CM | POA: Diagnosis not present

## 2018-09-16 DIAGNOSIS — Z45018 Encounter for adjustment and management of other part of cardiac pacemaker: Secondary | ICD-10-CM | POA: Diagnosis not present

## 2018-09-21 ENCOUNTER — Ambulatory Visit (INDEPENDENT_AMBULATORY_CARE_PROVIDER_SITE_OTHER): Payer: Medicare Other | Admitting: Urology

## 2018-09-21 ENCOUNTER — Encounter: Payer: Self-pay | Admitting: Urology

## 2018-09-21 VITALS — BP 116/66 | HR 75 | Ht 64.0 in | Wt 188.8 lb

## 2018-09-21 DIAGNOSIS — N3281 Overactive bladder: Secondary | ICD-10-CM | POA: Diagnosis not present

## 2018-09-21 DIAGNOSIS — N3946 Mixed incontinence: Secondary | ICD-10-CM

## 2018-09-21 LAB — BLADDER SCAN AMB NON-IMAGING: Scan Result: 153

## 2018-09-21 MED ORDER — OXYBUTYNIN CHLORIDE ER 15 MG PO TB24: 15.0000 mg | ORAL_TABLET | Freq: Every day | ORAL | 11 refills | Status: DC

## 2018-09-21 NOTE — Patient Instructions (Signed)

## 2018-09-21 NOTE — Progress Notes (Signed)
09/21/2018 3:02 PM   [place] Apr 10, 1946 [id number]  Referring provider: Juline Patch, MD [address] [place], [place] [address]  Chief Complaint  Patient presents with  . Over Active Bladder    HPI: 73 yo female seen for incontinence. She leaks when she stands and when she sleeps. She voids with an adeqaute flow. She has urgency and UUI. She has frequency. She leaks with cough and sneeze. Some of these issue were going on pre-op. Wearing 6-8 ppd. Her bowels are regular now. She is on oxybutynin 10 mg.   She has had a hysterectomy. NG risk includes L4-L5 fusion Jun 29, 2018 with post-op cauda equina and now residual LE weakness. She ambulates with a walker.   Bladder scan 153 ml. She did not void prior. UA Nov 2019 was normal, pale.   Modifying factors: There are no other modifying factors  Associated signs and symptoms: There are no other associated signs and symptoms Aggravating and relieving factors: There are no other aggravating or relieving factors Severity: Moderate Duration: Persistent  PMH: Past Medical History:  Diagnosis Date  . Allergy   . COPD (chronic obstructive pulmonary disease) ([place])   . Depression   . Dysrhythmia    Complete Heart Block  . Gastritis, chronic   . GERD (gastroesophageal reflux disease)   . Graves disease   . History of hiatal hernia   . Hyperlipidemia   . Hypertension   . Hypothyroidism   . Presence of permanent cardiac pacemaker    2012    Surgical History: Past Surgical History:  Procedure Laterality Date  . APPENDECTOMY    . COLONOSCOPY  2000  . COLONOSCOPY WITH PROPOFOL N/A 04/26/2015   Procedure: COLONOSCOPY WITH PROPOFOL;  Surgeon: Hulen Luster, MD;  Location: [facility] ENDOSCOPY;  Service: Gastroenterology;  Laterality: N/A;  . fibroid tumors     fingers and wrist  . FOOT SURGERY    . TONSILLECTOMY    . TRANSFORAMINAL LUMBAR INTERBODY FUSION (TLIF) WITH PEDICLE SCREW FIXATION 1 LEVEL N/A 06/29/2018   Procedure: TRANSFORAMINAL LUMBAR INTERBODY FUSION (TLIF) WITH PEDICLE SCREW FIXATION 1 LEVEL;  Surgeon: [name], MD;  Location: [facility] ORS;  Service: Neurosurgery;  Laterality: N/A;  . VAGINAL HYSTERECTOMY      Home Medications:  Allergies as of 09/21/2018      Reactions   Penicillins Other (See Comments)   Has patient had a PCN reaction causing immediate rash, facial/tongue/throat swelling, SOB or lightheadedness with hypotension: No Has patient had a PCN reaction causing severe rash involving mucus membranes or skin necrosis: No Has patient had a PCN reaction that required hospitalization No Has patient had a PCN reaction occurring within the last 10 years: No If all of the above answers are "NO", then may proceed with Cephalosporin use.      Medication List       Accurate as of September 21, 2018  3:02 PM. Always use your most recent med list.        acetaminophen 500 MG tablet Commonly known as:  TYLENOL Take 1,000 mg by mouth every 8 (eight) hours as needed (pain).   ANORO ELLIPTA 62.5-25 MCG/INH Aepb Generic drug:  umeclidinium-vilanterol Inhale 1 puff into the lungs daily.   citalopram 40 MG tablet Commonly known as:  CELEXA TAKE 1 TABLET BY MOUTH EVERY DAY   gabapentin 300 MG capsule Commonly known as:  NEURONTIN Take 1 capsule (300 mg total) by mouth 3 (three) times daily.  gemfibrozil 600 MG tablet Commonly known as:  LOPID Take 1 tablet (600 mg total) by mouth daily.   levothyroxine 125 MCG tablet Commonly known as:  SYNTHROID, LEVOTHROID TAKE 1 TABLET BY MOUTH EVERY DAY   loratadine 10 MG tablet Commonly known as:  CLARITIN Take 1 tablet (10 mg total) by mouth daily.   losartan 50 MG tablet Commonly known as:  COZAAR Take 1 tablet (50 mg total) by mouth daily.   methocarbamol 500 MG tablet Commonly known as:  ROBAXIN Take 1 tablet (500 mg total) by mouth every 6 (six) hours as needed for muscle spasms.   metoprolol succinate 50 MG 24 hr  tablet Commonly known as:  TOPROL-XL TAKE 1 TABLET (50 MG TOTAL) BY MOUTH DAILY.   montelukast 10 MG tablet Commonly known as:  SINGULAIR TAKE 1 TABLET BY MOUTH EVERYDAY AT BEDTIME   nitrofurantoin (macrocrystal-monohydrate) 100 MG capsule Commonly known as:  MACROBID Take 1 capsule (100 mg total) by mouth 2 (two) times daily.   nystatin cream Commonly known as:  MYCOSTATIN Apply 1 application topically 2 (two) times daily.   oxybutynin 10 MG 24 hr tablet Commonly known as:  DITROPAN-XL Take by mouth.   simvastatin 20 MG tablet Commonly known as:  ZOCOR Take 1 tablet (20 mg total) by mouth daily.       Allergies:  Allergies  Allergen Reactions  . Penicillins Other (See Comments)    Has patient had a PCN reaction causing immediate rash, facial/tongue/throat swelling, SOB or lightheadedness with hypotension: No Has patient had a PCN reaction causing severe rash involving mucus membranes or skin necrosis: No Has patient had a PCN reaction that required hospitalization No Has patient had a PCN reaction occurring within the last 10 years: No If all of the above answers are "NO", then may proceed with Cephalosporin use.     Family History: Family History  Problem Relation Age of Onset  . Dementia Mother   . Heart disease Father   . Diabetes Sister   . Breast cancer Neg Hx     Social History:  reports that she quit smoking about 17 years ago. Her smoking use included cigarettes. She has a 16.50 pack-year smoking history. She has never used smokeless tobacco. She reports previous alcohol use. She reports that she does not use drugs.  ROS: UROLOGY Frequent Urination?: Yes Hard to postpone urination?: Yes Burning/pain with urination?: No Get up at night to urinate?: Yes Leakage of urine?: Yes Urine stream starts and stops?: Yes Trouble starting stream?: No Do you have to strain to urinate?: No Blood in urine?: No Urinary tract infection?: No Sexually transmitted  disease?: No Injury to kidneys or bladder?: No Painful intercourse?: No Weak stream?: No Currently pregnant?: No Vaginal bleeding?: No Last menstrual period?: n  Gastrointestinal Nausea?: No Vomiting?: No Indigestion/heartburn?: No Diarrhea?: No Constipation?: No  Constitutional Fever: No Night sweats?: No Weight loss?: No Fatigue?: No  Skin Skin rash/lesions?: No Itching?: No  Eyes Blurred vision?: No Double vision?: No  Ears/Nose/Throat Sore throat?: No Sinus problems?: No  Hematologic/Lymphatic Swollen glands?: No Easy bruising?: No  Cardiovascular Leg swelling?: No Chest pain?: No  Respiratory Cough?: No Shortness of breath?: No  Endocrine Excessive thirst?: No  Musculoskeletal Back pain?: No Joint pain?: No  Neurological Headaches?: No Dizziness?: No  Psychologic Depression?: No Anxiety?: No  Physical Exam: BP 116/66 (BP Location: Left Arm, Patient Position: Sitting, Cuff Size: Normal)   Pulse 75   Ht 5\' 4"  (1.626 m)  Wt 85.6 kg   BMI 32.41 kg/m   Constitutional:  Alert and oriented, No acute distress. HEENT: [place] AT, moist mucus membranes.  Trachea midline, no masses. Cardiovascular: No clubbing, cyanosis, or edema. Respiratory: Normal respiratory effort, no increased work of breathing. GI: Abdomen is soft, nontender, nondistended, no abdominal masses GU: No CVA tenderness Lymph: No cervical or inguinal lymphadenopathy. Skin: No rashes, bruises or suspicious lesions. Neurologic: Grossly intact, no focal deficits, moving all 4 extremities. Psychiatric: Normal mood and affect.  Laboratory Data: Lab Results  Component Value Date   WBC 7.1 06/23/2018   HGB 14.1 06/23/2018   HCT 42.2 06/23/2018   MCV 100.0 06/23/2018   PLT 293 06/23/2018    Lab Results  Component Value Date   CREATININE 0.74 06/23/2018    No results found for: PSA  No results found for: TESTOSTERONE  Lab Results  Component Value Date   HGBA1C 6.3 (H)  03/11/2016    Urinalysis    Component Value Date/Time   COLORURINE COLORLESS (A) 06/23/2018 1409   APPEARANCEUR CLEAR (A) 06/23/2018 1409   LABSPEC 1.005 06/23/2018 1409   PHURINE 6.0 06/23/2018 1409   GLUCOSEU NEGATIVE 06/23/2018 1409   HGBUR SMALL (A) 06/23/2018 1409   BILIRUBINUR NEGATIVE 06/23/2018 1409   BILIRUBINUR negative 08/10/2017 1501   KETONESUR NEGATIVE 06/23/2018 1409   PROTEINUR NEGATIVE 06/23/2018 1409   UROBILINOGEN 0.2 08/10/2017 1501   NITRITE NEGATIVE 06/23/2018 1409   LEUKOCYTESUR NEGATIVE 06/23/2018 1409    Lab Results  Component Value Date   BACTERIA NONE SEEN 06/23/2018    No results found for this or any previous visit. No results found for this or any previous visit. No results found for this or any previous visit. No results found for this or any previous visit. No results found for this or any previous visit. No results found for this or any previous visit. No results found for this or any previous visit. No results found for this or any previous visit.  Assessment & Plan:    1. OAB (overactive bladder) Will increase oxybutynin to 15 mg. I will refer to pelvic floor PT. See back in 4-6 weeks for exam and cystoscopy.  - Bladder Scan (Post Void Residual) in office   No follow-ups on file.  Festus Aloe, MD  [facility] [address], [place], [place] [address] [phone]

## 2018-09-29 DIAGNOSIS — M6281 Muscle weakness (generalized): Secondary | ICD-10-CM | POA: Diagnosis not present

## 2018-09-29 DIAGNOSIS — R2689 Other abnormalities of gait and mobility: Secondary | ICD-10-CM | POA: Diagnosis not present

## 2018-09-30 ENCOUNTER — Encounter: Payer: Self-pay | Admitting: Family Medicine

## 2018-09-30 ENCOUNTER — Ambulatory Visit (INDEPENDENT_AMBULATORY_CARE_PROVIDER_SITE_OTHER): Payer: Medicare Other | Admitting: Family Medicine

## 2018-09-30 VITALS — BP 100/70 | HR 88 | Temp 98.5°F | Ht 64.0 in | Wt 183.0 lb

## 2018-09-30 DIAGNOSIS — J0100 Acute maxillary sinusitis, unspecified: Secondary | ICD-10-CM

## 2018-09-30 MED ORDER — AZITHROMYCIN 250 MG PO TABS: ORAL_TABLET | ORAL | 0 refills | Status: DC

## 2018-09-30 NOTE — Progress Notes (Signed)
Date:  09/30/2018   Name:  Danielle Taylor   DOB:  1945/10/13   MRN:  [id number]   Chief Complaint: Sinusitis (cough- some green production, hoarse)  Sinusitis  This is a new problem. The current episode started yesterday. The problem has been gradually worsening since onset. There has been no fever. The pain is mild. Associated symptoms include congestion. Pertinent negatives include no chills, coughing, diaphoresis, ear pain, headaches, hoarse voice, neck pain, shortness of breath, sinus pressure, sneezing, sore throat or swollen glands. Past treatments include acetaminophen. The treatment provided moderate relief.    Review of Systems  Constitutional: Negative.  Negative for chills, diaphoresis, fatigue, fever and unexpected weight change.  HENT: Positive for congestion. Negative for ear discharge, ear pain, hoarse voice, rhinorrhea, sinus pressure, sneezing and sore throat.   Eyes: Negative for photophobia, pain, discharge, redness and itching.  Respiratory: Negative for cough, shortness of breath, wheezing and stridor.   Gastrointestinal: Negative for abdominal pain, blood in stool, constipation, diarrhea, nausea and vomiting.  Endocrine: Negative for cold intolerance, heat intolerance, polydipsia, polyphagia and polyuria.  Genitourinary: Negative for dysuria, flank pain, frequency, hematuria, menstrual problem, pelvic pain, urgency, vaginal bleeding and vaginal discharge.  Musculoskeletal: Negative for arthralgias, back pain, myalgias and neck pain.  Skin: Negative for rash.  Allergic/Immunologic: Negative for environmental allergies and food allergies.  Neurological: Negative for dizziness, weakness, light-headedness, numbness and headaches.  Hematological: Negative for adenopathy. Does not bruise/bleed easily.  Psychiatric/Behavioral: Negative for dysphoric mood. The patient is not nervous/anxious.     Patient Active Problem List   Diagnosis Date Noted  . Lumbar  radiculopathy 06/29/2018  . Reactive airway disease, mild intermittent, uncomplicated 34/74/2595  . Chronic seasonal allergic rhinitis due to pollen 08/10/2017  . Mixed hyperlipidemia 08/10/2017  . Depression 08/10/2017  . Class 1 obesity due to excess calories without serious comorbidity with body mass index (BMI) of 34.0 to 34.9 in adult 08/10/2017  . Centrilobular emphysema ([place]) 04/02/2017  . Lumbar disc disease 04/02/2017  . Hepatic steatosis 04/02/2017  . Atherosclerosis of coronary artery of native heart 04/02/2017  . Syncope and collapse 03/11/2016  . Right leg weakness 03/11/2016  . Essential hypertension 03/11/2016  . Hyperglycemia 03/11/2016  . Reactive airway disease 03/04/2015  . Hypothyroid 12/07/2014  . Familial multiple lipoprotein-type hyperlipidemia 12/07/2014  . Acute bronchitis 12/07/2014  . Recurrent major depressive episodes ([place]) 12/07/2014  . Essential (primary) hypertension 12/07/2014  . H/O endocrine disorder 12/07/2014  . Pre-operative examination 12/07/2014    Allergies  Allergen Reactions  . Penicillins Other (See Comments)    Has patient had a PCN reaction causing immediate rash, facial/tongue/throat swelling, SOB or lightheadedness with hypotension: No Has patient had a PCN reaction causing severe rash involving mucus membranes or skin necrosis: No Has patient had a PCN reaction that required hospitalization No Has patient had a PCN reaction occurring within the last 10 years: No If all of the above answers are "NO", then may proceed with Cephalosporin use.     Past Surgical History:  Procedure Laterality Date  . APPENDECTOMY    . COLONOSCOPY  2000  . COLONOSCOPY WITH PROPOFOL N/A 04/26/2015   Procedure: COLONOSCOPY WITH PROPOFOL;  Surgeon: Hulen Luster, MD;  Location: [facility] ENDOSCOPY;  Service: Gastroenterology;  Laterality: N/A;  . fibroid tumors     fingers and wrist  . FOOT SURGERY    . TONSILLECTOMY    . TRANSFORAMINAL LUMBAR INTERBODY FUSION  (TLIF) WITH PEDICLE SCREW FIXATION 1  LEVEL N/A 06/29/2018   Procedure: TRANSFORAMINAL LUMBAR INTERBODY FUSION (TLIF) WITH PEDICLE SCREW FIXATION 1 LEVEL;  Surgeon: [name], MD;  Location: [facility] ORS;  Service: Neurosurgery;  Laterality: N/A;  . VAGINAL HYSTERECTOMY      Social History   Tobacco Use  . Smoking status: Former Smoker    Packs/day: 0.50    Years: 33.00    Pack years: 16.50    Types: Cigarettes    Last attempt to quit: 2003    Years since quitting: 17.1  . Smokeless tobacco: Never Used  . Tobacco comment: smoking cessation materials not required  Substance Use Topics  . Alcohol use: Not Currently    Alcohol/week: 0.0 standard drinks    Comment: occassional  . Drug use: No     Medication list has been reviewed and updated.  Current Meds  Medication Sig  . acetaminophen (TYLENOL) 500 MG tablet Take 1,000 mg by mouth every 8 (eight) hours as needed (pain).  . citalopram (CELEXA) 40 MG tablet TAKE 1 TABLET BY MOUTH EVERY DAY  . gabapentin (NEURONTIN) 300 MG capsule Take 1 capsule (300 mg total) by mouth 3 (three) times daily.  Marland Kitchen gemfibrozil (LOPID) 600 MG tablet Take 1 tablet (600 mg total) by mouth daily.  Marland Kitchen levothyroxine (SYNTHROID, LEVOTHROID) 125 MCG tablet TAKE 1 TABLET BY MOUTH EVERY DAY  . loratadine (CLARITIN) 10 MG tablet Take 1 tablet (10 mg total) by mouth daily.  Marland Kitchen losartan (COZAAR) 50 MG tablet Take 1 tablet (50 mg total) by mouth daily.  . metoprolol succinate (TOPROL-XL) 50 MG 24 hr tablet TAKE 1 TABLET (50 MG TOTAL) BY MOUTH DAILY.  . montelukast (SINGULAIR) 10 MG tablet TAKE 1 TABLET BY MOUTH EVERYDAY AT BEDTIME  . nystatin cream (MYCOSTATIN) Apply 1 application topically 2 (two) times daily. (Patient taking differently: Apply 1 application topically 2 (two) times daily as needed for dry skin. )  . oxybutynin (DITROPAN XL) 15 MG 24 hr tablet Take 1 tablet (15 mg total) by mouth at bedtime.  . simvastatin (ZOCOR) 20 MG tablet Take 1 tablet (20  mg total) by mouth daily.  Marland Kitchen umeclidinium-vilanterol (ANORO ELLIPTA) 62.5-25 MCG/INH AEPB Inhale 1 puff into the lungs daily.   . [DISCONTINUED] methocarbamol (ROBAXIN) 500 MG tablet Take 1 tablet (500 mg total) by mouth every 6 (six) hours as needed for muscle spasms.   Current Facility-Administered Medications for the 09/30/18 encounter (Office Visit) with Juline Patch, MD  Medication  . albuterol (PROVENTIL) (2.5 MG/3ML) 0.083% nebulizer solution 2.5 mg  . ipratropium-albuterol (DUONEB) 0.5-2.5 (3) MG/3ML nebulizer solution 3 mL    PHQ 2/9 Scores 05/03/2018 11/15/2017 10/29/2017 02/08/2017  PHQ - 2 Score 0 0 0 0  PHQ- 9 Score 0 0 0 -    Physical Exam Vitals signs and nursing note reviewed.  Constitutional:      General: She is not in acute distress.    Appearance: She is not diaphoretic.  HENT:     Head: Normocephalic and atraumatic.     Right Ear: Hearing, tympanic membrane, ear canal and external ear normal.     Left Ear: Hearing, tympanic membrane, ear canal and external ear normal.     Nose:     Right Sinus: Maxillary sinus tenderness present. No frontal sinus tenderness.     Left Sinus: Maxillary sinus tenderness present.     Mouth/Throat:     Mouth: Mucous membranes are dry.     Pharynx: Oropharynx is clear. Uvula midline.  Eyes:  General:        Right eye: No discharge.        Left eye: No discharge.     Conjunctiva/sclera: Conjunctivae normal.     Pupils: Pupils are equal, round, and reactive to light.  Neck:     Musculoskeletal: Normal range of motion and neck supple.     Thyroid: No thyromegaly.     Vascular: No JVD.  Cardiovascular:     Rate and Rhythm: Normal rate and regular rhythm.     Heart sounds: Normal heart sounds. No murmur. No friction rub. No gallop.   Pulmonary:     Effort: Pulmonary effort is normal.     Breath sounds: Normal breath sounds.  Abdominal:     General: Bowel sounds are normal.     Palpations: Abdomen is soft. There is no mass.      Tenderness: There is no abdominal tenderness. There is no guarding.  Musculoskeletal: Normal range of motion.  Lymphadenopathy:     Cervical: No cervical adenopathy.  Skin:    General: Skin is warm and dry.  Neurological:     Mental Status: She is alert.     Deep Tendon Reflexes: Reflexes are normal and symmetric.     BP 100/70   Pulse 88   Temp 98.5 F (36.9 C) (Oral)   Ht 5\' 4"  (1.626 m)   Wt 183 lb (83 kg)   SpO2 95%   BMI 31.41 kg/m   Assessment and Plan: 1. Acute maxillary sinusitis, recurrence not specified Acute. Onset yesterday. Some green production, mostly drainage. Start ZPack use as directed - azithromycin (ZITHROMAX) 250 MG tablet; 2 today then 1 a day for 4 days  Dispense: 6 tablet; Refill: 0

## 2018-10-04 ENCOUNTER — Telehealth: Payer: Self-pay

## 2018-10-04 NOTE — Telephone Encounter (Signed)
Pt called in stating she is not feeling any better. Hasn't had but 4 days of Zpack- was told that sometimes it takes 8-9 days to feel somewhat better. Continue the course of ZPack. Offered [employer], but pt has cough syrup that she said she will continue with.

## 2018-10-12 DIAGNOSIS — M6281 Muscle weakness (generalized): Secondary | ICD-10-CM | POA: Diagnosis not present

## 2018-10-12 DIAGNOSIS — R2689 Other abnormalities of gait and mobility: Secondary | ICD-10-CM | POA: Diagnosis not present

## 2018-10-14 DIAGNOSIS — R2689 Other abnormalities of gait and mobility: Secondary | ICD-10-CM | POA: Diagnosis not present

## 2018-10-14 DIAGNOSIS — M6281 Muscle weakness (generalized): Secondary | ICD-10-CM | POA: Diagnosis not present

## 2018-10-19 ENCOUNTER — Other Ambulatory Visit: Payer: Self-pay | Admitting: Family Medicine

## 2018-10-19 DIAGNOSIS — M6281 Muscle weakness (generalized): Secondary | ICD-10-CM | POA: Diagnosis not present

## 2018-10-19 DIAGNOSIS — R2689 Other abnormalities of gait and mobility: Secondary | ICD-10-CM | POA: Diagnosis not present

## 2018-10-19 DIAGNOSIS — I10 Essential (primary) hypertension: Secondary | ICD-10-CM

## 2018-10-24 DIAGNOSIS — R2689 Other abnormalities of gait and mobility: Secondary | ICD-10-CM | POA: Diagnosis not present

## 2018-10-24 DIAGNOSIS — M6281 Muscle weakness (generalized): Secondary | ICD-10-CM | POA: Diagnosis not present

## 2018-10-26 DIAGNOSIS — M6281 Muscle weakness (generalized): Secondary | ICD-10-CM | POA: Diagnosis not present

## 2018-10-26 DIAGNOSIS — R2689 Other abnormalities of gait and mobility: Secondary | ICD-10-CM | POA: Diagnosis not present

## 2018-10-28 ENCOUNTER — Ambulatory Visit (INDEPENDENT_AMBULATORY_CARE_PROVIDER_SITE_OTHER): Payer: Medicare Other | Admitting: Urology

## 2018-10-28 ENCOUNTER — Encounter: Payer: Self-pay | Admitting: Urology

## 2018-10-28 VITALS — BP 122/68 | HR 72 | Ht 64.0 in | Wt 177.5 lb

## 2018-10-28 DIAGNOSIS — Z880 Allergy status to penicillin: Secondary | ICD-10-CM | POA: Insufficient documentation

## 2018-10-28 DIAGNOSIS — E890 Postprocedural hypothyroidism: Secondary | ICD-10-CM | POA: Insufficient documentation

## 2018-10-28 DIAGNOSIS — N3946 Mixed incontinence: Secondary | ICD-10-CM | POA: Diagnosis not present

## 2018-10-28 DIAGNOSIS — Z7901 Long term (current) use of anticoagulants: Secondary | ICD-10-CM | POA: Insufficient documentation

## 2018-10-28 DIAGNOSIS — J449 Chronic obstructive pulmonary disease, unspecified: Secondary | ICD-10-CM | POA: Insufficient documentation

## 2018-10-28 DIAGNOSIS — R339 Retention of urine, unspecified: Secondary | ICD-10-CM | POA: Diagnosis not present

## 2018-10-28 DIAGNOSIS — I442 Atrioventricular block, complete: Secondary | ICD-10-CM | POA: Insufficient documentation

## 2018-10-28 DIAGNOSIS — N3281 Overactive bladder: Secondary | ICD-10-CM | POA: Diagnosis not present

## 2018-10-28 LAB — URINALYSIS, COMPLETE
Bilirubin, UA: NEGATIVE
Glucose, UA: NEGATIVE
Ketones, UA: NEGATIVE
NITRITE UA: POSITIVE — AB
Protein, UA: NEGATIVE
RBC, UA: NEGATIVE
Specific Gravity, UA: 1.015 (ref 1.005–1.030)
Urobilinogen, Ur: 0.2 mg/dL (ref 0.2–1.0)
pH, UA: 5.5 (ref 5.0–7.5)

## 2018-10-28 LAB — MICROSCOPIC EXAMINATION
Epithelial Cells (non renal): >10 /hpf — ABNORMAL HIGH (ref 0–10)
RBC, UA: NONE SEEN /hpf (ref 0–2)
WBC, UA: >30 /hpf — ABNORMAL HIGH (ref 0–5)

## 2018-10-28 MED ORDER — TAMSULOSIN HCL 0.4 MG PO CAPS: 0.4000 mg | ORAL_CAPSULE | Freq: Every day | ORAL | 3 refills | Status: DC

## 2018-10-28 NOTE — Progress Notes (Signed)
10/28/2018 10:39 AM   Danielle Taylor [birth year] [id number]  Referring provider: Juline Patch, MD [address] [place], [place] [address]  Chief Complaint  Patient presents with  . Cysto    HPI:  F/u mixed incontinence. She leaks when she stands and when she sleeps. She voids with straining and doesn't really get the urge to void until it's too late. She has urgency and UUI. She has frequency. She leaks with cough and sneeze. Some of these issue were going on pre-op. Wearing 6-8 ppd. Her bowels are regular now. She was on oxybutynin 10 mg and we bumped it up to 15 mg daily.   She has had a hysterectomy. NG risk includes L4-L5 fusion Jun 29, 2018 with post-op cauda equina and now residual LE weakness. She ambulates with a walker.   Bladder scan 153 ml. She did not void prior. UA Nov 2019 was normal, pale.   She returns for cystoscopy and to assess response to medication and management of incontinence. UA with many bacteria but she has no dysuria. No fever or bladder pain. She strained to give a small amount of urine. PVR 300 ml. She's not bothered by prolapse symptoms. She leaks without awareness.   PMH: Past Medical History:  Diagnosis Date  . Allergy   . COPD (chronic obstructive pulmonary disease) ([place])   . Depression   . Dysrhythmia    Complete Heart Block  . Gastritis, chronic   . GERD (gastroesophageal reflux disease)   . Graves disease   . History of hiatal hernia   . Hyperlipidemia   . Hypertension   . Hypothyroidism   . Presence of permanent cardiac pacemaker    2012    Surgical History: Past Surgical History:  Procedure Laterality Date  . APPENDECTOMY    . COLONOSCOPY  2000  . COLONOSCOPY WITH PROPOFOL N/A 04/26/2015   Procedure: COLONOSCOPY WITH PROPOFOL;  Surgeon: Hulen Luster, MD;  Location: [facility] ENDOSCOPY;  Service: Gastroenterology;  Laterality: N/A;  . fibroid tumors     fingers and wrist  . FOOT SURGERY    . TONSILLECTOMY    .  TRANSFORAMINAL LUMBAR INTERBODY FUSION (TLIF) WITH PEDICLE SCREW FIXATION 1 LEVEL N/A 06/29/2018   Procedure: TRANSFORAMINAL LUMBAR INTERBODY FUSION (TLIF) WITH PEDICLE SCREW FIXATION 1 LEVEL;  Surgeon: [name], MD;  Location: [facility] ORS;  Service: Neurosurgery;  Laterality: N/A;  . VAGINAL HYSTERECTOMY      Home Medications:  Allergies as of 10/28/2018      Reactions   Penicillins Other (See Comments)   Has patient had a PCN reaction causing immediate rash, facial/tongue/throat swelling, SOB or lightheadedness with hypotension: No Has patient had a PCN reaction causing severe rash involving mucus membranes or skin necrosis: No Has patient had a PCN reaction that required hospitalization No Has patient had a PCN reaction occurring within the last 10 years: No If all of the above answers are "NO", then may proceed with Cephalosporin use.      Medication List       Accurate as of October 28, 2018 10:39 AM. Always use your most recent med list.        acetaminophen 500 MG tablet Commonly known as:  TYLENOL Take 1,000 mg by mouth every 8 (eight) hours as needed (pain).   Anoro Ellipta 62.5-25 MCG/INH Aepb Generic drug:  umeclidinium-vilanterol Inhale 1 puff into the lungs daily.   citalopram 40 MG tablet Commonly known as:  CELEXA TAKE 1 TABLET  BY MOUTH EVERY DAY   gabapentin 300 MG capsule Commonly known as:  NEURONTIN Take 1 capsule (300 mg total) by mouth 3 (three) times daily.   gemfibrozil 600 MG tablet Commonly known as:  LOPID Take 1 tablet (600 mg total) by mouth daily.   levothyroxine 125 MCG tablet Commonly known as:  SYNTHROID, LEVOTHROID TAKE 1 TABLET BY MOUTH EVERY DAY   loratadine 10 MG tablet Commonly known as:  CLARITIN Take 1 tablet (10 mg total) by mouth daily.   losartan 25 MG tablet Commonly known as:  COZAAR TAKE 2 TABLETS BY MOUTH EVERY DAY   metoprolol succinate 50 MG 24 hr tablet Commonly known as:  TOPROL-XL TAKE 1 TABLET (50 MG TOTAL)  BY MOUTH DAILY.   montelukast 10 MG tablet Commonly known as:  SINGULAIR TAKE 1 TABLET BY MOUTH EVERYDAY AT BEDTIME   nystatin cream Commonly known as:  MYCOSTATIN Apply 1 application topically 2 (two) times daily.   oxybutynin 15 MG 24 hr tablet Commonly known as:  DITROPAN XL Take 1 tablet (15 mg total) by mouth at bedtime.   simvastatin 20 MG tablet Commonly known as:  ZOCOR Take 1 tablet (20 mg total) by mouth daily.       Allergies:  Allergies  Allergen Reactions  . Penicillins Other (See Comments)    Has patient had a PCN reaction causing immediate rash, facial/tongue/throat swelling, SOB or lightheadedness with hypotension: No Has patient had a PCN reaction causing severe rash involving mucus membranes or skin necrosis: No Has patient had a PCN reaction that required hospitalization No Has patient had a PCN reaction occurring within the last 10 years: No If all of the above answers are "NO", then may proceed with Cephalosporin use.     Family History: Family History  Problem Relation Age of Onset  . Dementia Mother   . Heart disease Father   . Diabetes Sister   . Breast cancer Neg Hx     Social History:  reports that she quit smoking about 17 years ago. Her smoking use included cigarettes. She has a 16.50 pack-year smoking history. She has never used smokeless tobacco. She reports previous alcohol use. She reports that she does not use drugs.  ROS:                                        PE: NED. A&Ox3.   No respiratory distress   Abd soft, NT, ND Normal external genitalia with patent urethral meatus Grade I cystocele, Grade II rectocele, good apical support, enlarged introitus  Cystoscopy Procedure Note  Patient identification was confirmed, informed consent was obtained, and patient was prepped using Betadine solution.  Lidocaine jelly was administered per urethral meatus.  Chaperone-Carrie for exam and  cystoscopy  Procedure: - Flexible cystoscope introduced, without any difficulty. 300 ml was drained. Urine cx sent.  - Thorough search of the bladder revealed:    normal urethral meatus    Urethral hypermobility -yes    SUI - no    normal urothelium    no stones    no ulcers     no tumors    no urethral polyps    no trabeculation  - Ureteral orifices were normal in position and appearance.  Post-Procedure: - Patient tolerated the procedure well   Laboratory Data: Lab Results  Component Value Date   WBC 7.1 06/23/2018   HGB  14.1 06/23/2018   HCT 42.2 06/23/2018   MCV 100.0 06/23/2018   PLT 293 06/23/2018    Lab Results  Component Value Date   CREATININE 0.74 06/23/2018    No results found for: PSA  No results found for: TESTOSTERONE  Lab Results  Component Value Date   HGBA1C 6.3 (H) 03/11/2016    Urinalysis    Component Value Date/Time   COLORURINE COLORLESS (A) 06/23/2018 1409   APPEARANCEUR CLEAR (A) 06/23/2018 1409   LABSPEC 1.005 06/23/2018 1409   PHURINE 6.0 06/23/2018 1409   GLUCOSEU NEGATIVE 06/23/2018 1409   HGBUR SMALL (A) 06/23/2018 1409   BILIRUBINUR NEGATIVE 06/23/2018 1409   BILIRUBINUR negative 08/10/2017 1501   KETONESUR NEGATIVE 06/23/2018 1409   PROTEINUR NEGATIVE 06/23/2018 1409   UROBILINOGEN 0.2 08/10/2017 1501   NITRITE NEGATIVE 06/23/2018 1409   LEUKOCYTESUR NEGATIVE 06/23/2018 1409    Lab Results  Component Value Date   BACTERIA NONE SEEN 06/23/2018    No results found for this or any previous visit. No results found for this or any previous visit. No results found for this or any previous visit. No results found for this or any previous visit. No results found for this or any previous visit. No results found for this or any previous visit. No results found for this or any previous visit. No results found for this or any previous visit.  Assessment & Plan:    Mixed incontinence / inc bladder emptying -  We  discussed urodynamics in [facility] which I recommend. She may have a neurogenic component. Discussed learning CIC or foley and she declined. Discussed adding tamsulosin and she will start it. Also discussed timed voiding. Continue oxybutynin for now.   - Urinalysis, Complete   No follow-ups on file.  Festus Aloe, MD  [facility] [address], [place], [place] [address] [phone]

## 2018-10-31 DIAGNOSIS — R2689 Other abnormalities of gait and mobility: Secondary | ICD-10-CM | POA: Diagnosis not present

## 2018-10-31 DIAGNOSIS — M6281 Muscle weakness (generalized): Secondary | ICD-10-CM | POA: Diagnosis not present

## 2018-11-04 ENCOUNTER — Other Ambulatory Visit: Payer: Self-pay | Admitting: Family Medicine

## 2018-11-04 DIAGNOSIS — R2689 Other abnormalities of gait and mobility: Secondary | ICD-10-CM | POA: Diagnosis not present

## 2018-11-04 DIAGNOSIS — M6281 Muscle weakness (generalized): Secondary | ICD-10-CM | POA: Diagnosis not present

## 2018-11-07 DIAGNOSIS — N3942 Incontinence without sensory awareness: Secondary | ICD-10-CM | POA: Diagnosis not present

## 2018-11-07 DIAGNOSIS — N390 Urinary tract infection, site not specified: Secondary | ICD-10-CM | POA: Diagnosis not present

## 2018-11-07 DIAGNOSIS — R3914 Feeling of incomplete bladder emptying: Secondary | ICD-10-CM | POA: Diagnosis not present

## 2018-11-08 DIAGNOSIS — R2689 Other abnormalities of gait and mobility: Secondary | ICD-10-CM | POA: Diagnosis not present

## 2018-11-08 DIAGNOSIS — M6281 Muscle weakness (generalized): Secondary | ICD-10-CM | POA: Diagnosis not present

## 2018-11-10 DIAGNOSIS — R2689 Other abnormalities of gait and mobility: Secondary | ICD-10-CM | POA: Diagnosis not present

## 2018-11-10 DIAGNOSIS — M6281 Muscle weakness (generalized): Secondary | ICD-10-CM | POA: Diagnosis not present

## 2018-11-17 ENCOUNTER — Telehealth: Payer: Self-pay | Admitting: Family Medicine

## 2018-11-17 ENCOUNTER — Ambulatory Visit: Payer: Self-pay

## 2018-11-17 DIAGNOSIS — M4316 Spondylolisthesis, lumbar region: Secondary | ICD-10-CM | POA: Diagnosis not present

## 2018-11-17 DIAGNOSIS — Z981 Arthrodesis status: Secondary | ICD-10-CM | POA: Diagnosis not present

## 2018-11-17 NOTE — Telephone Encounter (Signed)
Spoke with the patient and rescheduled her Medicare Annual Wellness Visit for Wednesday, 11/23/2018 at 1:20 PM and she authorized the AWV to be done telephonically and to call phone [phone].  Patient is notified to please have a list of all current medications, a list of all current providers she is seeing.  Also, patient is asked if she has the equipment to please obtain and write down her temperature, weight, BP and Pulse-Ox for her AWV.  Patient voiced understood.  Janace Hoard, Care Guide.

## 2018-11-18 DIAGNOSIS — M6281 Muscle weakness (generalized): Secondary | ICD-10-CM | POA: Diagnosis not present

## 2018-11-18 DIAGNOSIS — R2689 Other abnormalities of gait and mobility: Secondary | ICD-10-CM | POA: Diagnosis not present

## 2018-11-21 ENCOUNTER — Ambulatory Visit: Payer: Self-pay

## 2018-11-21 ENCOUNTER — Other Ambulatory Visit: Payer: Self-pay | Admitting: Family Medicine

## 2018-11-21 ENCOUNTER — Telehealth: Payer: Self-pay | Admitting: Urology

## 2018-11-21 DIAGNOSIS — R3914 Feeling of incomplete bladder emptying: Secondary | ICD-10-CM | POA: Diagnosis not present

## 2018-11-21 DIAGNOSIS — N390 Urinary tract infection, site not specified: Secondary | ICD-10-CM | POA: Diagnosis not present

## 2018-11-21 DIAGNOSIS — F32A Depression, unspecified: Secondary | ICD-10-CM

## 2018-11-21 DIAGNOSIS — E782 Mixed hyperlipidemia: Secondary | ICD-10-CM

## 2018-11-21 DIAGNOSIS — F329 Major depressive disorder, single episode, unspecified: Secondary | ICD-10-CM

## 2018-11-21 DIAGNOSIS — N3942 Incontinence without sensory awareness: Secondary | ICD-10-CM | POA: Diagnosis not present

## 2018-11-21 NOTE — Telephone Encounter (Signed)
Patient had her UDS done @ [facility] and wants to know if you can call her with the results? We are moving all appts out and she said she needs an answer sooner than later.  Please advise  Thanks, Sharyn Lull

## 2018-11-22 ENCOUNTER — Encounter: Payer: Self-pay | Admitting: Neurosurgery

## 2018-11-23 ENCOUNTER — Ambulatory Visit: Payer: Self-pay | Admitting: Urology

## 2018-11-23 ENCOUNTER — Ambulatory Visit: Payer: Self-pay

## 2018-11-23 NOTE — Telephone Encounter (Signed)
Called patient and lm to cb to confirm follow up appt Left detailed message with follow up app    Sharyn Lull

## 2018-11-25 DIAGNOSIS — R2689 Other abnormalities of gait and mobility: Secondary | ICD-10-CM | POA: Diagnosis not present

## 2018-11-25 DIAGNOSIS — M6281 Muscle weakness (generalized): Secondary | ICD-10-CM | POA: Diagnosis not present

## 2018-11-28 ENCOUNTER — Other Ambulatory Visit: Payer: Self-pay | Admitting: Urology

## 2018-12-13 ENCOUNTER — Other Ambulatory Visit: Payer: Self-pay | Admitting: Family Medicine

## 2018-12-13 DIAGNOSIS — I10 Essential (primary) hypertension: Secondary | ICD-10-CM

## 2018-12-14 ENCOUNTER — Ambulatory Visit: Payer: Self-pay | Admitting: Urology

## 2018-12-15 ENCOUNTER — Other Ambulatory Visit: Payer: Self-pay | Admitting: Family Medicine

## 2018-12-16 DIAGNOSIS — R2689 Other abnormalities of gait and mobility: Secondary | ICD-10-CM | POA: Diagnosis not present

## 2018-12-16 DIAGNOSIS — Z950 Presence of cardiac pacemaker: Secondary | ICD-10-CM | POA: Diagnosis not present

## 2018-12-16 DIAGNOSIS — Z45018 Encounter for adjustment and management of other part of cardiac pacemaker: Secondary | ICD-10-CM | POA: Diagnosis not present

## 2018-12-16 DIAGNOSIS — M6281 Muscle weakness (generalized): Secondary | ICD-10-CM | POA: Diagnosis not present

## 2018-12-23 DIAGNOSIS — R2689 Other abnormalities of gait and mobility: Secondary | ICD-10-CM | POA: Diagnosis not present

## 2018-12-23 DIAGNOSIS — M6281 Muscle weakness (generalized): Secondary | ICD-10-CM | POA: Diagnosis not present

## 2018-12-28 DIAGNOSIS — R2689 Other abnormalities of gait and mobility: Secondary | ICD-10-CM | POA: Diagnosis not present

## 2018-12-28 DIAGNOSIS — M6281 Muscle weakness (generalized): Secondary | ICD-10-CM | POA: Diagnosis not present

## 2018-12-29 ENCOUNTER — Encounter: Payer: Self-pay | Admitting: Neurosurgery

## 2018-12-30 DIAGNOSIS — R2689 Other abnormalities of gait and mobility: Secondary | ICD-10-CM | POA: Diagnosis not present

## 2018-12-30 DIAGNOSIS — M6281 Muscle weakness (generalized): Secondary | ICD-10-CM | POA: Diagnosis not present

## 2019-01-06 DIAGNOSIS — M6281 Muscle weakness (generalized): Secondary | ICD-10-CM | POA: Diagnosis not present

## 2019-01-06 DIAGNOSIS — R2689 Other abnormalities of gait and mobility: Secondary | ICD-10-CM | POA: Diagnosis not present

## 2019-01-07 ENCOUNTER — Other Ambulatory Visit: Payer: Self-pay | Admitting: Family Medicine

## 2019-01-07 DIAGNOSIS — I10 Essential (primary) hypertension: Secondary | ICD-10-CM

## 2019-01-12 DIAGNOSIS — M6281 Muscle weakness (generalized): Secondary | ICD-10-CM | POA: Diagnosis not present

## 2019-01-12 DIAGNOSIS — R2689 Other abnormalities of gait and mobility: Secondary | ICD-10-CM | POA: Diagnosis not present

## 2019-01-19 DIAGNOSIS — R2689 Other abnormalities of gait and mobility: Secondary | ICD-10-CM | POA: Diagnosis not present

## 2019-01-19 DIAGNOSIS — M6281 Muscle weakness (generalized): Secondary | ICD-10-CM | POA: Diagnosis not present

## 2019-01-20 ENCOUNTER — Other Ambulatory Visit: Payer: Self-pay | Admitting: Family Medicine

## 2019-01-20 DIAGNOSIS — I10 Essential (primary) hypertension: Secondary | ICD-10-CM

## 2019-01-22 ENCOUNTER — Other Ambulatory Visit: Payer: Self-pay | Admitting: Family Medicine

## 2019-01-22 DIAGNOSIS — E782 Mixed hyperlipidemia: Secondary | ICD-10-CM

## 2019-01-22 DIAGNOSIS — E7849 Other hyperlipidemia: Secondary | ICD-10-CM

## 2019-01-25 ENCOUNTER — Other Ambulatory Visit: Payer: Self-pay

## 2019-01-25 ENCOUNTER — Ambulatory Visit (INDEPENDENT_AMBULATORY_CARE_PROVIDER_SITE_OTHER): Payer: Medicare Other

## 2019-01-25 VITALS — BP 142/82 | HR 68 | Temp 96.0°F | Resp 16 | Ht 64.0 in | Wt 171.0 lb

## 2019-01-25 DIAGNOSIS — Z1231 Encounter for screening mammogram for malignant neoplasm of breast: Secondary | ICD-10-CM

## 2019-01-25 DIAGNOSIS — Z0000 Encounter for general adult medical examination without abnormal findings: Secondary | ICD-10-CM

## 2019-01-25 NOTE — Progress Notes (Addendum)
Subjective:   Danielle Taylor is a 73 y.o. female who presents for Medicare Annual (Subsequent) preventive examination.  Review of Systems:   Cardiac Risk Factors include: advanced age (>79men, >81 women);hypertension;dyslipidemia     Objective:     Vitals: BP (!) 142/82 (BP Location: Left Arm, Patient Position: Sitting, Cuff Size: Normal)   Pulse 68   Temp (!) 96 F (35.6 C) (Oral)   Resp 16   Ht 5\' 4"  (1.626 m)   Wt 171 lb (77.6 kg)   SpO2 96%   BMI 29.35 kg/m   Body mass index is 29.35 kg/m.  Advanced Directives 01/25/2019 06/29/2018 06/23/2018 11/15/2017 03/11/2016 04/26/2015 03/04/2015  Does Patient Have a Medical Advance Directive? Yes Yes Yes No No No No  Type of Advance Directive Living will;Healthcare Power of [place];Living will - - - -  Does patient want to make changes to medical advance directive? - No - Patient declined - - - - -  Copy of [place] in Chart? Yes - validated most recent copy scanned in chart (See row information) No - copy requested No - copy requested - - - -  Would patient like information on creating a medical advance directive? - - - Yes (MAU/Ambulatory/Procedural Areas - Information given) - - No - patient declined information    Tobacco Social History   Tobacco Use  Smoking Status Former Smoker  . Packs/day: 0.50  . Years: 33.00  . Pack years: 16.50  . Types: Cigarettes  . Last attempt to quit: 2003  . Years since quitting: 17.4  Smokeless Tobacco Never Used  Tobacco Comment   smoking cessation materials not required     Counseling given: Not Answered Comment: smoking cessation materials not required   Clinical Intake:  Pre-visit preparation completed: Yes  Pain : No/denies pain     BMI - recorded: 29.35 Nutritional Status: BMI 25 -29 Overweight Nutritional Risks: None Diabetes: No  How often do you need to have someone help you when you  read instructions, pamphlets, or other written materials from your doctor or pharmacy?: 1 - Never  Interpreter Needed?: No  Information entered by :: Clemetine Marker LPN  Past Medical History:  Diagnosis Date  . Allergy   . COPD (chronic obstructive pulmonary disease) ([place])   . Depression   . Dysrhythmia    Complete Heart Block  . Gastritis, chronic   . GERD (gastroesophageal reflux disease)   . Graves disease   . History of hiatal hernia   . Hyperlipidemia   . Hypertension   . Hypothyroidism   . Incontinence    bladder and bowel  . Presence of permanent cardiac pacemaker    2012   Past Surgical History:  Procedure Laterality Date  . APPENDECTOMY    . COLONOSCOPY  2000  . COLONOSCOPY WITH PROPOFOL N/A 04/26/2015   Procedure: COLONOSCOPY WITH PROPOFOL;  Surgeon: Hulen Luster, MD;  Location: [facility] ENDOSCOPY;  Service: Gastroenterology;  Laterality: N/A;  . fibroid tumors     fingers and wrist  . FOOT SURGERY    . TONSILLECTOMY    . TRANSFORAMINAL LUMBAR INTERBODY FUSION (TLIF) WITH PEDICLE SCREW FIXATION 1 LEVEL N/A 06/29/2018   Procedure: TRANSFORAMINAL LUMBAR INTERBODY FUSION (TLIF) WITH PEDICLE SCREW FIXATION 1 LEVEL;  Surgeon: [name], MD;  Location: [facility] ORS;  Service: Neurosurgery;  Laterality: N/A;  . VAGINAL HYSTERECTOMY     Family History  Problem Relation Age  of Onset  . Dementia Mother   . Heart disease Father   . Diabetes Sister   . Breast cancer Neg Hx    Social History   Socioeconomic History  . Marital status: Married    Spouse name: Not on file  . Number of children: 2  . Years of education: Not on file  . Highest education level: 12th grade  Occupational History  . Occupation: Retired  Scientific laboratory technician  . Financial resource strain: Not hard at all  . Food insecurity:    Worry: Never true    Inability: Never true  . Transportation needs:    Medical: No    Non-medical: No  Tobacco Use  . Smoking status: Former Smoker    Packs/day: 0.50     Years: 33.00    Pack years: 16.50    Types: Cigarettes    Last attempt to quit: 2003    Years since quitting: 17.4  . Smokeless tobacco: Never Used  . Tobacco comment: smoking cessation materials not required  Substance and Sexual Activity  . Alcohol use: Not Currently    Alcohol/week: 0.0 standard drinks    Comment: occassional  . Drug use: No  . Sexual activity: Not Currently  Lifestyle  . Physical activity:    Days per week: 4 days    Minutes per session: 60 min  . Stress: Not at all  Relationships  . Social connections:    Talks on phone: More than three times a week    Gets together: Three times a week    Attends religious service: More than 4 times per year    Active member of club or organization: No    Attends meetings of clubs or organizations: Never    Relationship status: Married  Other Topics Concern  . Not on file  Social History Narrative  . Not on file    Outpatient Encounter Medications as of 01/25/2019  Medication Sig  . acetaminophen (TYLENOL) 500 MG tablet Take 1,000 mg by mouth every 8 (eight) hours as needed (pain).  . citalopram (CELEXA) 40 MG tablet TAKE 1 TABLET BY MOUTH EVERY DAY  . gabapentin (NEURONTIN) 300 MG capsule Take 1 capsule (300 mg total) by mouth 3 (three) times daily.  Marland Kitchen gemfibrozil (LOPID) 600 MG tablet TAKE 1 TABLET BY MOUTH EVERY DAY  . levothyroxine (SYNTHROID) 125 MCG tablet TAKE 1 TABLET BY MOUTH EVERY DAY  . loratadine (CLARITIN) 10 MG tablet Take 1 tablet (10 mg total) by mouth daily.  Marland Kitchen losartan (COZAAR) 25 MG tablet TAKE 2 TABLETS BY MOUTH EVERY DAY  . metoprolol succinate (TOPROL-XL) 50 MG 24 hr tablet TAKE 1 TABLET BY MOUTH EVERY DAY  . montelukast (SINGULAIR) 10 MG tablet TAKE 1 TABLET BY MOUTH EVERYDAY AT BEDTIME  . oxybutynin (DITROPAN XL) 15 MG 24 hr tablet Take 1 tablet (15 mg total) by mouth at bedtime.  . rivaroxaban (XARELTO) 20 MG TABS tablet Take 20 mg by mouth daily with supper.  . simvastatin (ZOCOR) 20 MG tablet  TAKE 1 TABLET BY MOUTH EVERY DAY  . tamsulosin (FLOMAX) 0.4 MG CAPS capsule Take 1 capsule (0.4 mg total) by mouth daily after supper.  . umeclidinium-vilanterol (ANORO ELLIPTA) 62.5-25 MCG/INH AEPB Inhale 1 puff into the lungs daily.   Marland Kitchen nystatin cream (MYCOSTATIN) Apply 1 application topically 2 (two) times daily. (Patient not taking: Reported on 01/25/2019)   Facility-Administered Encounter Medications as of 01/25/2019  Medication  . albuterol (PROVENTIL) (2.5 MG/3ML) 0.083% nebulizer solution  2.5 mg  . ipratropium-albuterol (DUONEB) 0.5-2.5 (3) MG/3ML nebulizer solution 3 mL    Activities of Daily Living In your present state of health, do you have any difficulty performing the following activities: 01/25/2019 06/29/2018  Hearing? N N  Comment declines hearing aids -  Vision? N N  Comment wears glasses -  Difficulty concentrating or making decisions? N N  Walking or climbing stairs? N Y  Dressing or bathing? N N  Doing errands, shopping? N N  Preparing Food and eating ? N -  Using the Toilet? N -  In the past six months, have you accidently leaked urine? Y -  Do you have problems with loss of bowel control? Y -  Managing your Medications? N -  Managing your Finances? N -  Housekeeping or managing your Housekeeping? N -  Some recent data might be hidden    Patient Care Team: Juline Patch, MD as PCP - General (Family Medicine) Erby Pian, MD as Consulting Physician (Specialist)    Assessment:   This is a routine wellness examination for Danielle Taylor.  Exercise Activities and Dietary recommendations Current Exercise Habits: Home exercise routine, Type of exercise: strength training/weights;Other - see comments(exercise bike), Time (Minutes): 60, Frequency (Times/Week): 4, Weekly Exercise (Minutes/Week): 240, Intensity: Moderate, Exercise limited by: orthopedic condition(s);neurologic condition(s)  Goals    . DIET - INCREASE WATER INTAKE     Recommend to drink at least 6-8 8oz  glasses of water per day.    . Patient Stated     Resolve issues with bowel and bladder that occurred after lumbar surgery.        Fall Risk Fall Risk  01/25/2019 11/15/2017 03/15/2017 02/08/2017 12/24/2015  Falls in the past year? 0 No Yes No No  Comment - - Emmi Telephone Survey: data to providers prior to load - -  Number falls in past yr: 0 - 2 or more - -  Comment - - Emmi Telephone Survey Actual Response = 2 - -  Injury with Fall? - - No - -  Risk for fall due to : Impaired balance/gait History of fall(s);Impaired vision - - -  Risk for fall due to: Comment - syncope; wears eyeglasses - - -  Follow up Falls prevention discussed - - - -   FALL RISK PREVENTION PERTAINING TO THE HOME:  Any stairs in or around the home? Yes  If so, do they handrails? Yes   Home free of loose throw rugs in walkways, pet beds, electrical cords, etc? Yes  Adequate lighting in your home to reduce risk of falls? Yes   ASSISTIVE DEVICES UTILIZED TO PREVENT FALLS:  Life alert? No  Use of a cane, walker or w/c? Yes  Grab bars in the bathroom? Yes  Shower chair or bench in shower? No  Elevated toilet seat or a handicapped toilet? No   DME ORDERS:  DME order needed?  No   TIMED UP AND GO:  Was the test performed? Yes .  Length of time to ambulate 10 feet: 8 sec.   GAIT:  Appearance of gait:  Gait slow, steady and with the use of an assistive device.   Education: Fall risk prevention has been discussed.  Intervention(s) required? No   Depression Screen PHQ 2/9 Scores 01/25/2019 05/03/2018 11/15/2017 10/29/2017  PHQ - 2 Score 0 0 0 0  PHQ- 9 Score - 0 0 0     Cognitive Function     6CIT Screen 01/25/2019 11/15/2017  What  Year? 0 points 0 points  What month? 0 points 0 points  What time? 0 points 0 points  Count back from 20 0 points 0 points  Months in reverse 0 points 0 points  Repeat phrase 0 points 0 points  Total Score 0 0    Immunization History  Administered Date(s) Administered  .  Influenza Nasal 06/08/2017  . Influenza, High Dose Seasonal PF 06/11/2017, 05/03/2018  . Influenza, Seasonal, Injecte, Preservative Fre 05/28/2011, 06/21/2012  . Influenza,inj,Quad PF,6+ Mos 07/15/2015, 05/28/2016  . Influenza-Unspecified 05/24/2014, 07/15/2015, 05/28/2016  . PPD Test 04/05/2017  . Pneumococcal Conjugate-13 10/22/2014  . Pneumococcal Polysaccharide-23 11/15/2017    Qualifies for Shingles Vaccine?Up to date   Screening Tests Health Maintenance  Topic Date Due  . MAMMOGRAM  12/03/2018  . TETANUS/TDAP  05/04/2019 (Originally 07/03/1965)  . Hepatitis C Screening  05/04/2019 (Originally October 20, 1945)  . INFLUENZA VACCINE  03/25/2019  . COLONOSCOPY  04/25/2025  . DEXA SCAN  Completed  . PNA vac Low Risk Adult  Completed    Cancer Screenings:  Colorectal Screening: Completed 04/26/15. Repeat every 10 years;  Mammogram: Completed 12/02/17. Repeat every year. Ordered today. Pt provided with contact information and advised to call to schedule appt.   Bone Density: Completed 12/02/17. Results reflect NORMAL Repeat every 2 years.   Lung Cancer Screening: (Low Dose CT Chest recommended if Age 85-80 years, 30 pack-year currently smoking OR have quit w/in 15years.) does not qualify.   Additional Screening:  Hepatitis C Screening: does qualify; postponed  Vision Screening: Recommended annual ophthalmology exams for early detection of glaucoma and other disorders of the eye. Is the patient up to date with their annual eye exam?  Yes  Who is the provider or what is the name of the office in which the pt attends annual eye exams? [facility]   Dental Screening: Recommended annual dental exams for proper oral hygiene  Community Resource Referral:  CRR required this visit?  No      Plan:     I have personally reviewed and addressed the Medicare Annual Wellness questionnaire and have noted the following in the patient's chart:  A. Medical and social history B. Use of  alcohol, tobacco or illicit drugs  C. Current medications and supplements D. Functional ability and status E.  Nutritional status F.  Physical activity G. Advance directives H. List of other physicians I.  Hospitalizations, surgeries, and ER visits in previous 12 months J.  [place] such as hearing and vision if needed, cognitive and depression L. Referrals and appointments   In addition, I have reviewed and discussed with patient certain preventive protocols, quality metrics, and best practice recommendations. A written personalized care plan for preventive services as well as general preventive health recommendations were provided to patient.   Signed,  Clemetine Marker, LPN Nurse Health Advisor   Nurse Notes: pt doing well despite issues with incontinence she has experienced since lumbar fusion in 06/2018. Pt has upcoming appt with [facility]. Pt also advised to schedule follow up appt this month with Dr. Ronnald Ramp for medication refills.

## 2019-01-25 NOTE — Patient Instructions (Signed)
Danielle Taylor , Thank you for taking time to come for your Medicare Wellness Visit. I appreciate your ongoing commitment to your health goals. Please review the following plan we discussed and let me know if I can assist you in the future.   Screening recommendations/referrals: Colonoscopy: done 04/26/15. Repeat in 2026. Mammogram: 12/02/17. Please call [phone] to schedule your mammogram.  Bone Density: done 12/02/17 Recommended yearly ophthalmology/optometry visit for glaucoma screening and checkup Recommended yearly dental visit for hygiene and checkup  Vaccinations: Influenza vaccine: done 05/03/18 Pneumococcal vaccine: done 11/15/17 Tdap vaccine: due - please contact us if you get a cut or scrape.  Shingles vaccine: Shingrix discussed. Please contact your pharmacy for coverage information.    Conditions/risks identified: Recommend continuing physical activity.  Next appointment: Please follow up in one year for your Medicare Annual Wellness visit.    Preventive Care 14 Years and Older, Female Preventive care refers to lifestyle choices and visits with your health care provider that can promote health and wellness. What does preventive care include?  A yearly physical exam. This is also called an annual well check.  Dental exams once or twice a year.  Routine eye exams. Ask your health care provider how often you should have your eyes checked.  Personal lifestyle choices, including:  Daily care of your teeth and gums.  Regular physical activity.  Eating a healthy diet.  Avoiding tobacco and drug use.  Limiting alcohol use.  Practicing safe sex.  Taking low-dose aspirin every day.  Taking vitamin and mineral supplements as recommended by your health care provider. What happens during an annual well check? The services and screenings done by your health care provider during your annual well check will depend on your age, overall health, lifestyle risk factors, and  family history of disease. Counseling  Your health care provider may ask you questions about your:  Alcohol use.  Tobacco use.  Drug use.  Emotional well-being.  Home and relationship well-being.  Sexual activity.  Eating habits.  History of falls.  Memory and ability to understand (cognition).  Work and work Statistician.  Reproductive health. Screening  You may have the following tests or measurements:  Height, weight, and BMI.  Blood pressure.  Lipid and cholesterol levels. These may be checked every 5 years, or more frequently if you are over 80 years old.  Skin check.  Lung cancer screening. You may have this screening every year starting at age 36 if you have a 30-pack-year history of smoking and currently smoke or have quit within the past 15 years.  Fecal occult blood test (FOBT) of the stool. You may have this test every year starting at age 56.  Flexible sigmoidoscopy or colonoscopy. You may have a sigmoidoscopy every 5 years or a colonoscopy every 10 years starting at age 41.  Hepatitis C blood test.  Hepatitis B blood test.  Sexually transmitted disease (STD) testing.  Diabetes screening. This is done by checking your blood sugar (glucose) after you have not eaten for a while (fasting). You may have this done every 1-3 years.  Bone density scan. This is done to screen for osteoporosis. You may have this done starting at age 37.  Mammogram. This may be done every 1-2 years. Talk to your health care provider about how often you should have regular mammograms. Talk with your health care provider about your test results, treatment options, and if necessary, the need for more tests. Vaccines  Your health care provider may recommend certain  vaccines, such as:  Influenza vaccine. This is recommended every year.  Tetanus, diphtheria, and acellular pertussis (Tdap, Td) vaccine. You may need a Td booster every 10 years.  Zoster vaccine. You may need this  after age 56.  Pneumococcal 13-valent conjugate (PCV13) vaccine. One dose is recommended after age 22.  Pneumococcal polysaccharide (PPSV23) vaccine. One dose is recommended after age 74. Talk to your health care provider about which screenings and vaccines you need and how often you need them. This information is not intended to replace advice given to you by your health care provider. Make sure you discuss any questions you have with your health care provider. Document Released: 09/06/2015 Document Revised: 04/29/2016 Document Reviewed: 06/11/2015 Elsevier Interactive Patient Education  2017 [place] Prevention in the Home Falls can cause injuries. They can happen to people of all ages. There are many things you can do to make your home safe and to help prevent falls. What can I do on the outside of my home?  Regularly fix the edges of walkways and driveways and fix any cracks.  Remove anything that might make you trip as you walk through a door, such as a raised step or threshold.  Trim any bushes or trees on the path to your home.  Use bright outdoor lighting.  Clear any walking paths of anything that might make someone trip, such as rocks or tools.  Regularly check to see if handrails are loose or broken. Make sure that both sides of any steps have handrails.  Any raised decks and porches should have guardrails on the edges.  Have any leaves, snow, or ice cleared regularly.  Use sand or salt on walking paths during winter.  Clean up any spills in your garage right away. This includes oil or grease spills. What can I do in the bathroom?  Use night lights.  Install grab bars by the toilet and in the tub and shower. Do not use towel bars as grab bars.  Use non-skid mats or decals in the tub or shower.  If you need to sit down in the shower, use a plastic, non-slip stool.  Keep the floor dry. Clean up any water that spills on the floor as soon as it happens.   Remove soap buildup in the tub or shower regularly.  Attach bath mats securely with double-sided non-slip rug tape.  Do not have throw rugs and other things on the floor that can make you trip. What can I do in the bedroom?  Use night lights.  Make sure that you have a light by your bed that is easy to reach.  Do not use any sheets or blankets that are too big for your bed. They should not hang down onto the floor.  Have a firm chair that has side arms. You can use this for support while you get dressed.  Do not have throw rugs and other things on the floor that can make you trip. What can I do in the kitchen?  Clean up any spills right away.  Avoid walking on wet floors.  Keep items that you use a lot in easy-to-reach places.  If you need to reach something above you, use a strong step stool that has a grab bar.  Keep electrical cords out of the way.  Do not use floor polish or wax that makes floors slippery. If you must use wax, use non-skid floor wax.  Do not have throw rugs and other things  on the floor that can make you trip. What can I do with my stairs?  Do not leave any items on the stairs.  Make sure that there are handrails on both sides of the stairs and use them. Fix handrails that are broken or loose. Make sure that handrails are as long as the stairways.  Check any carpeting to make sure that it is firmly attached to the stairs. Fix any carpet that is loose or worn.  Avoid having throw rugs at the top or bottom of the stairs. If you do have throw rugs, attach them to the floor with carpet tape.  Make sure that you have a light switch at the top of the stairs and the bottom of the stairs. If you do not have them, ask someone to add them for you. What else can I do to help prevent falls?  Wear shoes that:  Do not have high heels.  Have rubber bottoms.  Are comfortable and fit you well.  Are closed at the toe. Do not wear sandals.  If you use a  stepladder:  Make sure that it is fully opened. Do not climb a closed stepladder.  Make sure that both sides of the stepladder are locked into place.  Ask someone to hold it for you, if possible.  Clearly mark and make sure that you can see:  Any grab bars or handrails.  First and last steps.  Where the edge of each step is.  Use tools that help you move around (mobility aids) if they are needed. These include:  Canes.  Walkers.  Scooters.  Crutches.  Turn on the lights when you go into a dark area. Replace any light bulbs as soon as they burn out.  Set up your furniture so you have a clear path. Avoid moving your furniture around.  If any of your floors are uneven, fix them.  If there are any pets around you, be aware of where they are.  Review your medicines with your doctor. Some medicines can make you feel dizzy. This can increase your chance of falling. Ask your doctor what other things that you can do to help prevent falls. This information is not intended to replace advice given to you by your health care provider. Make sure you discuss any questions you have with your health care provider. Document Released: 06/06/2009 Document Revised: 01/16/2016 Document Reviewed: 09/14/2014 Elsevier Interactive Patient Education  2017 [employer].

## 2019-01-27 ENCOUNTER — Ambulatory Visit: Payer: Self-pay | Admitting: Urology

## 2019-01-27 DIAGNOSIS — R2689 Other abnormalities of gait and mobility: Secondary | ICD-10-CM | POA: Diagnosis not present

## 2019-01-27 DIAGNOSIS — M6281 Muscle weakness (generalized): Secondary | ICD-10-CM | POA: Diagnosis not present

## 2019-02-04 ENCOUNTER — Other Ambulatory Visit: Payer: Self-pay | Admitting: Family Medicine

## 2019-02-04 DIAGNOSIS — I10 Essential (primary) hypertension: Secondary | ICD-10-CM

## 2019-02-12 ENCOUNTER — Other Ambulatory Visit: Payer: Self-pay | Admitting: Family Medicine

## 2019-02-12 DIAGNOSIS — E782 Mixed hyperlipidemia: Secondary | ICD-10-CM

## 2019-02-15 ENCOUNTER — Ambulatory Visit (INDEPENDENT_AMBULATORY_CARE_PROVIDER_SITE_OTHER): Payer: Medicare Other | Admitting: Family Medicine

## 2019-02-15 ENCOUNTER — Other Ambulatory Visit: Payer: Self-pay | Admitting: Family Medicine

## 2019-02-15 ENCOUNTER — Other Ambulatory Visit: Payer: Self-pay

## 2019-02-15 ENCOUNTER — Encounter: Payer: Self-pay | Admitting: Family Medicine

## 2019-02-15 VITALS — BP 120/70 | HR 80 | Ht 64.0 in | Wt 169.0 lb

## 2019-02-15 DIAGNOSIS — F329 Major depressive disorder, single episode, unspecified: Secondary | ICD-10-CM

## 2019-02-15 DIAGNOSIS — J301 Allergic rhinitis due to pollen: Secondary | ICD-10-CM

## 2019-02-15 DIAGNOSIS — E7849 Other hyperlipidemia: Secondary | ICD-10-CM

## 2019-02-15 DIAGNOSIS — J4520 Mild intermittent asthma, uncomplicated: Secondary | ICD-10-CM

## 2019-02-15 DIAGNOSIS — I10 Essential (primary) hypertension: Secondary | ICD-10-CM

## 2019-02-15 DIAGNOSIS — E782 Mixed hyperlipidemia: Secondary | ICD-10-CM

## 2019-02-15 DIAGNOSIS — E039 Hypothyroidism, unspecified: Secondary | ICD-10-CM

## 2019-02-15 DIAGNOSIS — R69 Illness, unspecified: Secondary | ICD-10-CM

## 2019-02-15 DIAGNOSIS — F32A Depression, unspecified: Secondary | ICD-10-CM

## 2019-02-15 MED ORDER — GEMFIBROZIL 600 MG PO TABS: 600.0000 mg | ORAL_TABLET | Freq: Every day | ORAL | 1 refills | Status: DC

## 2019-02-15 MED ORDER — CITALOPRAM HYDROBROMIDE 40 MG PO TABS: 40.0000 mg | ORAL_TABLET | Freq: Every day | ORAL | 1 refills | Status: DC

## 2019-02-15 MED ORDER — LOSARTAN POTASSIUM 25 MG PO TABS: 50.0000 mg | ORAL_TABLET | Freq: Every day | ORAL | 1 refills | Status: DC

## 2019-02-15 MED ORDER — METOPROLOL SUCCINATE ER 50 MG PO TB24: ORAL_TABLET | ORAL | 1 refills | Status: DC

## 2019-02-15 MED ORDER — LEVOTHYROXINE SODIUM 125 MCG PO TABS: 125.0000 ug | ORAL_TABLET | Freq: Every day | ORAL | 1 refills | Status: DC

## 2019-02-15 MED ORDER — LORATADINE 10 MG PO TABS: 10.0000 mg | ORAL_TABLET | Freq: Every day | ORAL | 1 refills | Status: DC

## 2019-02-15 MED ORDER — MONTELUKAST SODIUM 10 MG PO TABS: ORAL_TABLET | ORAL | 1 refills | Status: DC

## 2019-02-15 MED ORDER — SIMVASTATIN 20 MG PO TABS: 20.0000 mg | ORAL_TABLET | Freq: Every day | ORAL | 1 refills | Status: DC

## 2019-02-15 NOTE — Progress Notes (Signed)
Date:  02/15/2019   Name:  Danielle Taylor   DOB:  Apr 18, 1946   MRN:  [id number]   Chief Complaint: Depression, Allergic Rhinitis , Hypothyroidism, Hyperlipidemia, and Hypertension  Depression        This is a chronic problem.  The current episode started more than 1 year ago.   The onset quality is gradual.   The problem has been gradually improving since onset.  Associated symptoms include no decreased concentration, no fatigue, no helplessness, no hopelessness, does not have insomnia, not irritable, no restlessness, no decreased interest, no appetite change, no body aches, no myalgias, no headaches, no indigestion, not sad and no suicidal ideas.     The symptoms are aggravated by nothing.  Past treatments include SSRIs - Selective serotonin reuptake inhibitors.  Compliance with treatment is good.  Previous treatment provided moderate relief.  Past medical history includes hypothyroidism and thyroid problem.     Pertinent negatives include no anxiety. Hyperlipidemia This is a chronic problem. The current episode started more than 1 year ago. The problem is controlled. Recent lipid tests were reviewed and are normal. Exacerbating diseases include hypothyroidism. She has no history of chronic renal disease. Pertinent negatives include no chest pain, focal sensory loss, focal weakness, leg pain, myalgias or shortness of breath. Current antihyperlipidemic treatment includes fibric acid derivatives and statins. The current treatment provides moderate improvement of lipids. There are no compliance problems.   Hypertension This is a chronic problem. The problem has been gradually improving since onset. The problem is controlled. Pertinent negatives include no anxiety, blurred vision, chest pain, headaches, malaise/fatigue, neck pain, orthopnea, palpitations, peripheral edema, PND, shortness of breath or sweats. There are no associated agents to hypertension. Risk factors for coronary artery disease  include dyslipidemia and obesity. Past treatments include angiotensin blockers and beta blockers. The current treatment provides moderate improvement. There are no compliance problems.  There is no history of angina, kidney disease, CAD/MI, CVA, heart failure, left ventricular hypertrophy, PVD or retinopathy. Identifiable causes of hypertension include a thyroid problem. There is no history of chronic renal disease, a hypertension causing med or renovascular disease.  Thyroid Problem Presents for follow-up visit. Patient reports no anxiety, cold intolerance, constipation, depressed mood, diaphoresis, diarrhea, dry skin, fatigue, hair loss, heat intolerance, hoarse voice, leg swelling, nail problem, palpitations, tremors, visual change, weight gain or weight loss. The symptoms have been stable. Her past medical history is significant for hyperlipidemia. There is no history of heart failure.    Review of Systems  Constitutional: Negative for appetite change, chills, diaphoresis, fatigue, fever, malaise/fatigue, weight gain and weight loss.  HENT: Negative for drooling, ear discharge, ear pain, hoarse voice and sore throat.   Eyes: Negative for blurred vision.  Respiratory: Negative for cough, shortness of breath and wheezing.   Cardiovascular: Negative for chest pain, palpitations, orthopnea, leg swelling and PND.  Gastrointestinal: Negative for abdominal pain, blood in stool, constipation, diarrhea and nausea.  Endocrine: Negative for cold intolerance, heat intolerance and polydipsia.  Genitourinary: Negative for dysuria, frequency, hematuria and urgency.  Musculoskeletal: Negative for back pain, myalgias and neck pain.  Skin: Negative for rash.  Allergic/Immunologic: Negative for environmental allergies.  Neurological: Negative for dizziness, tremors, focal weakness and headaches.  Hematological: Does not bruise/bleed easily.  Psychiatric/Behavioral: Positive for depression. Negative for decreased  concentration and suicidal ideas. The patient is not nervous/anxious and does not have insomnia.     Patient Active Problem List   Diagnosis Date Noted  .  Anticoagulation adequate with anticoagulant therapy 10/28/2018  . Complete heart block, transient ([place]) 10/28/2018  . COPD (chronic obstructive pulmonary disease) ([place]) 10/28/2018  . Penicillin allergy 10/28/2018  . 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 Lumbar radiculopathy 06/29/2018  . Chronic low back pain with sciatica 02/15/2018  . Reactive airway disease, mild intermittent, uncomplicated 24/04/7352  . Chronic seasonal allergic rhinitis due to pollen 08/10/2017  . Mixed hyperlipidemia 08/10/2017  . Depression 08/10/2017  . Class 1 obesity due to excess calories without serious comorbidity with body mass index (BMI) of 34.0 to 34.9 in adult 08/10/2017  . Chronic cough 06/15/2017  . Interstitial lung disease ([place]) 06/15/2017  . Centrilobular emphysema ([place]) 04/02/2017  . Lumbar disc disease 04/02/2017  . Hepatic steatosis 04/02/2017  . Atherosclerosis of coronary artery of native heart 04/02/2017  . Cardiac pacemaker in situ 01/06/2017  . Paroxysmal A-fib ([place]) 03/30/2016  . Syncope and collapse 03/11/2016  . Right leg weakness 03/11/2016  . Essential hypertension 03/11/2016  . Hyperglycemia 03/11/2016  . 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 Reactive airway disease 03/04/2015  . Hypothyroid 12/07/2014  . Familial multiple lipoprotein-type hyperlipidemia 12/07/2014  . Acute bronchitis 12/07/2014  . Recurrent major depressive episodes ([place]) 12/07/2014  . Essential (primary) hypertension 12/07/2014  . H/O endocrine disorder 12/07/2014  . Pre-operative examination 12/07/2014  . History of depression 09/22/2012  . Fitting or adjustment of cardiac pacemaker 12/02/2011  . Chronic gastritis 07/24/1998    Allergies  Allergen Reactions  . Penicillins Other (See  Comments)    Has patient had a PCN reaction causing immediate rash, facial/tongue/throat swelling, SOB or lightheadedness with hypotension: No Has patient had a PCN reaction causing severe rash involving mucus membranes or skin necrosis: No Has patient had a PCN reaction that required hospitalization No Has patient had a PCN reaction occurring within the last 10 years: No If all of the above answers are "NO", then may proceed with Cephalosporin use.     Past Surgical History:  Procedure Laterality Date  . APPENDECTOMY    . COLONOSCOPY  2000  . COLONOSCOPY WITH PROPOFOL N/A 04/26/2015   Procedure: COLONOSCOPY WITH PROPOFOL;  Surgeon: Hulen Luster, MD;  Location: [facility] ENDOSCOPY;  Service: Gastroenterology;  Laterality: N/A;  . fibroid tumors     fingers and wrist  . FOOT SURGERY    . TONSILLECTOMY    . TRANSFORAMINAL LUMBAR INTERBODY FUSION (TLIF) WITH PEDICLE SCREW FIXATION 1 LEVEL N/A 06/29/2018   Procedure: TRANSFORAMINAL LUMBAR INTERBODY FUSION (TLIF) WITH PEDICLE SCREW FIXATION 1 LEVEL;  Surgeon: [name], MD;  Location: [facility] ORS;  Service: Neurosurgery;  Laterality: N/A;  . VAGINAL HYSTERECTOMY      Social History   Tobacco Use  . Smoking status: Former Smoker    Packs/day: 0.50    Years: 33.00    Pack years: 16.50    Types: Cigarettes    Quit date: 2003    Years since quitting: 17.4  . Smokeless tobacco: Never Used  . Tobacco comment: smoking cessation materials not required  Substance Use Topics  . Alcohol use: Not Currently    Alcohol/week: 0.0 standard drinks    Comment: occassional  . Drug use: No     Medication list has been reviewed and updated.  Current Meds  Medication Sig  . acetaminophen (TYLENOL) 500 MG tablet Take 1,000 mg by mouth every 8 (eight) hours as needed (pain).  . citalopram (CELEXA) 40 MG tablet TAKE 1 TABLET BY MOUTH  EVERY DAY  . gabapentin (NEURONTIN) 300 MG capsule Take 1 capsule (300 mg total) by mouth 3 (three) times daily.  (Patient taking differently: Take 300 mg by mouth 3 (three) times daily. Dr Cari Caraway)  . gemfibrozil (LOPID) 600 MG tablet TAKE 1 TABLET BY MOUTH EVERY DAY  . levothyroxine (SYNTHROID) 125 MCG tablet TAKE 1 TABLET BY MOUTH EVERY DAY  . loratadine (CLARITIN) 10 MG tablet Take 1 tablet (10 mg total) by mouth daily.  Marland Kitchen losartan (COZAAR) 25 MG tablet TAKE 2 TABLETS BY MOUTH EVERY DAY  . metoprolol succinate (TOPROL-XL) 50 MG 24 hr tablet TAKE 1 TABLET BY MOUTH EVERY DAY  . montelukast (SINGULAIR) 10 MG tablet TAKE 1 TABLET BY MOUTH EVERYDAY AT BEDTIME  . Multiple Vitamins-Minerals (CENTRUM SILVER 50+WOMEN PO) Take 1 tablet by mouth daily.  Marland Kitchen oxybutynin (DITROPAN XL) 15 MG 24 hr tablet Take 1 tablet (15 mg total) by mouth at bedtime.  . rivaroxaban (XARELTO) 20 MG TABS tablet Take 20 mg by mouth daily with supper.  . simvastatin (ZOCOR) 20 MG tablet TAKE 1 TABLET BY MOUTH EVERY DAY  . tamsulosin (FLOMAX) 0.4 MG CAPS capsule Take 1 capsule (0.4 mg total) by mouth daily after supper.  . umeclidinium-vilanterol (ANORO ELLIPTA) 62.5-25 MCG/INH AEPB Inhale 1 puff into the lungs daily.    Current Facility-Administered Medications for the 02/15/19 encounter (Office Visit) with Juline Patch, MD  Medication  . albuterol (PROVENTIL) (2.5 MG/3ML) 0.083% nebulizer solution 2.5 mg  . ipratropium-albuterol (DUONEB) 0.5-2.5 (3) MG/3ML nebulizer solution 3 mL    PHQ 2/9 Scores 02/15/2019 01/25/2019 05/03/2018 11/15/2017  PHQ - 2 Score 0 0 0 0  PHQ- 9 Score 0 - 0 0    BP Readings from Last 3 Encounters:  02/15/19 120/70  01/25/19 (!) 142/82  10/28/18 122/68    Physical Exam Vitals signs and nursing note reviewed.  Constitutional:      General: She is not irritable.She is not in acute distress.    Appearance: She is not diaphoretic.  HENT:     Head: Normocephalic and atraumatic.     Right Ear: Tympanic membrane, ear canal and external ear normal.     Left Ear: Tympanic membrane, ear canal and external  ear normal.     Nose: Nose normal.     Mouth/Throat:     Mouth: Mucous membranes are dry.  Eyes:     General:        Right eye: No discharge.        Left eye: No discharge.     Conjunctiva/sclera: Conjunctivae normal.     Pupils: Pupils are equal, round, and reactive to light.  Neck:     Musculoskeletal: Normal range of motion and neck supple. No neck rigidity or muscular tenderness.     Thyroid: No thyromegaly.     Vascular: No carotid bruit or JVD.  Cardiovascular:     Rate and Rhythm: Normal rate and regular rhythm.     Heart sounds: Normal heart sounds. No murmur. No friction rub. No gallop.   Pulmonary:     Effort: Pulmonary effort is normal. No respiratory distress.     Breath sounds: No stridor. No wheezing, rhonchi or rales.  Chest:     Chest wall: No tenderness.  Abdominal:     General: Bowel sounds are normal.     Palpations: Abdomen is soft. There is no mass.     Tenderness: There is no abdominal tenderness. There is no guarding.  Musculoskeletal:  Normal range of motion.  Lymphadenopathy:     Cervical: No cervical adenopathy.  Skin:    General: Skin is warm and dry.  Neurological:     Mental Status: She is alert.     Deep Tendon Reflexes: Reflexes are normal and symmetric.     Wt Readings from Last 3 Encounters:  02/15/19 169 lb (76.7 kg)  01/25/19 171 lb (77.6 kg)  10/28/18 177 lb 8 oz (80.5 kg)    BP 120/70   Pulse 80   Ht 5\' 4"  (1.626 m)   Wt 169 lb (76.7 kg)   BMI 29.01 kg/m   Assessment and Plan: 1. Depression, unspecified depression type Chronic.  Controlled.  Continue citalopram 40 mg once a day. - citalopram (CELEXA) 40 MG tablet; Take 1 tablet (40 mg total) by mouth daily.  Dispense: 90 tablet; Refill: 1  2. Mixed hyperlipidemia Chronic.  Controlled.  Continue gemfibrozil 600 mg 1 a day and simvastatin 20 mg once a day will check lipid panel. - gemfibrozil (LOPID) 600 MG tablet; Take 1 tablet (600 mg total) by mouth daily.  Dispense: 90  tablet; Refill: 1 - simvastatin (ZOCOR) 20 MG tablet; Take 1 tablet (20 mg total) by mouth daily.  Dispense: 90 tablet; Refill: 1 - Lipid Panel With LDL/HDL Ratio  3. Familial multiple lipoprotein-type hyperlipidemia Chronic.  Controlled.  See above - gemfibrozil (LOPID) 600 MG tablet; Take 1 tablet (600 mg total) by mouth daily.  Dispense: 90 tablet; Refill: 1  4. Essential (primary) hypertension Chronic.  Controlled.  Continue losartan 25 mg 2 tablets once a day.  Patient also takes metoprolol XL 50 mg 1 a day.  Will check renal function panel to excess GFR. - losartan (COZAAR) 25 MG tablet; Take 2 tablets (50 mg total) by mouth daily.  Dispense: 90 tablet; Refill: 1 - metoprolol succinate (TOPROL-XL) 50 MG 24 hr tablet; TAKE 1 TABLET BY MOUTH EVERY DAY  Dispense: 90 tablet; Refill: 1 - Renal Function Panel  5. Reactive airway disease, mild intermittent, uncomplicated Controlled.  Active airway disease managed by Singulair 10 mg once a day. - montelukast (SINGULAIR) 10 MG tablet; TAKE 1 TABLET BY MOUTH EVERYDAY AT BEDTIME  Dispense: 90 tablet; Refill: 1  6. Chronic seasonal allergic rhinitis due to pollen Chronic.  Recurrent.  Continue loratadine 10 mg once a day and Singulair 10 mg once a day. - loratadine (CLARITIN) 10 MG tablet; Take 1 tablet (10 mg total) by mouth daily.  Dispense: 90 tablet; Refill: 1 - montelukast (SINGULAIR) 10 MG tablet; TAKE 1 TABLET BY MOUTH EVERYDAY AT BEDTIME  Dispense: 90 tablet; Refill: 1  7. Hypothyroidism, unspecified type Chronic.  Will check TSH with thyroid panel in the meantime will refill levothyroxine 125 mcg daily. - levothyroxine (SYNTHROID) 125 MCG tablet; Take 1 tablet (125 mcg total) by mouth daily.  Dispense: 90 tablet; Refill: 1 - Thyroid Panel With TSH  8. Taking medication for chronic disease Patient is on statin agent for control of hyperlipidemia.  Will check hepatic function panel to assess for hepatotoxicity. - Hepatic function panel

## 2019-02-16 LAB — HEPATIC FUNCTION PANEL
ALT: 13 IU/L (ref 0–32)
AST: 16 IU/L (ref 0–40)
Alkaline Phosphatase: 126 IU/L — ABNORMAL HIGH (ref 39–117)
Bilirubin Total: 1 mg/dL (ref 0.0–1.2)
Bilirubin, Direct: 0.22 mg/dL (ref 0.00–0.40)
Total Protein: 7.2 g/dL (ref 6.0–8.5)

## 2019-02-16 LAB — THYROID PANEL WITH TSH
Free Thyroxine Index: 2.2 (ref 1.2–4.9)
T3 Uptake Ratio: 28 % (ref 24–39)
T4, Total: 7.9 ug/dL (ref 4.5–12.0)
TSH: 0.095 u[IU]/mL — ABNORMAL LOW (ref 0.450–4.500)

## 2019-02-16 LAB — RENAL FUNCTION PANEL
Albumin: 4.7 g/dL (ref 3.8–4.8)
BUN/Creatinine Ratio: 20 (ref 12–28)
BUN: 17 mg/dL (ref 8–27)
CO2: 21 mmol/L (ref 20–29)
Calcium: 10.1 mg/dL (ref 8.7–10.3)
Chloride: 101 mmol/L (ref 96–106)
Creatinine, Ser: 0.83 mg/dL (ref 0.57–1.00)
GFR calc Af Amer: 83 mL/min/{1.73_m2} (ref >59)
GFR calc non Af Amer: 72 mL/min/{1.73_m2} (ref >59)
Glucose: 105 mg/dL — ABNORMAL HIGH (ref 65–99)
Phosphorus: 3.5 mg/dL (ref 3.0–4.3)
Potassium: 4.7 mmol/L (ref 3.5–5.2)
Sodium: 140 mmol/L (ref 134–144)

## 2019-02-16 LAB — LIPID PANEL WITH LDL/HDL RATIO
Cholesterol, Total: 214 mg/dL — ABNORMAL HIGH (ref 100–199)
HDL: 72 mg/dL (ref >39)
LDL Calculated: 115 mg/dL — ABNORMAL HIGH (ref 0–99)
LDl/HDL Ratio: 1.6 ratio (ref 0.0–3.2)
Triglycerides: 135 mg/dL (ref 0–149)
VLDL Cholesterol Cal: 27 mg/dL (ref 5–40)

## 2019-02-17 ENCOUNTER — Other Ambulatory Visit: Payer: Self-pay

## 2019-02-17 ENCOUNTER — Other Ambulatory Visit: Payer: Self-pay | Admitting: *Deleted

## 2019-02-17 DIAGNOSIS — E039 Hypothyroidism, unspecified: Secondary | ICD-10-CM

## 2019-02-17 MED ORDER — TAMSULOSIN HCL 0.4 MG PO CAPS: 0.4000 mg | ORAL_CAPSULE | Freq: Every day | ORAL | 3 refills | Status: DC

## 2019-02-17 MED ORDER — LEVOTHYROXINE SODIUM 112 MCG PO TABS: 112.0000 ug | ORAL_TABLET | Freq: Every day | ORAL | 0 refills | Status: DC

## 2019-02-22 DIAGNOSIS — R8290 Unspecified abnormal findings in urine: Secondary | ICD-10-CM | POA: Diagnosis not present

## 2019-02-22 DIAGNOSIS — N398 Other specified disorders of urinary system: Secondary | ICD-10-CM | POA: Diagnosis not present

## 2019-02-22 DIAGNOSIS — N8111 Cystocele, midline: Secondary | ICD-10-CM | POA: Diagnosis not present

## 2019-02-22 DIAGNOSIS — R159 Full incontinence of feces: Secondary | ICD-10-CM | POA: Diagnosis not present

## 2019-02-22 DIAGNOSIS — N816 Rectocele: Secondary | ICD-10-CM | POA: Diagnosis not present

## 2019-02-22 DIAGNOSIS — N3941 Urge incontinence: Secondary | ICD-10-CM | POA: Diagnosis not present

## 2019-03-16 ENCOUNTER — Other Ambulatory Visit: Payer: Self-pay | Admitting: Family Medicine

## 2019-03-16 DIAGNOSIS — B379 Candidiasis, unspecified: Secondary | ICD-10-CM

## 2019-03-17 ENCOUNTER — Ambulatory Visit (INDEPENDENT_AMBULATORY_CARE_PROVIDER_SITE_OTHER): Payer: Medicare Other | Admitting: Family Medicine

## 2019-03-17 ENCOUNTER — Other Ambulatory Visit: Payer: Self-pay

## 2019-03-17 ENCOUNTER — Encounter: Payer: Self-pay | Admitting: Family Medicine

## 2019-03-17 VITALS — BP 120/70 | HR 68 | Ht 64.0 in | Wt 167.0 lb

## 2019-03-17 DIAGNOSIS — N3090 Cystitis, unspecified without hematuria: Secondary | ICD-10-CM | POA: Diagnosis not present

## 2019-03-17 DIAGNOSIS — Z45018 Encounter for adjustment and management of other part of cardiac pacemaker: Secondary | ICD-10-CM | POA: Diagnosis not present

## 2019-03-17 DIAGNOSIS — Z950 Presence of cardiac pacemaker: Secondary | ICD-10-CM | POA: Diagnosis not present

## 2019-03-17 DIAGNOSIS — I480 Paroxysmal atrial fibrillation: Secondary | ICD-10-CM | POA: Diagnosis not present

## 2019-03-17 LAB — POCT URINALYSIS DIPSTICK
Bilirubin, UA: POSITIVE
Blood, UA: NEGATIVE
Glucose, UA: NEGATIVE
Ketones, UA: NEGATIVE
Leukocytes, UA: NEGATIVE
Nitrite, UA: NEGATIVE
Protein, UA: NEGATIVE
Spec Grav, UA: 1.015 (ref 1.010–1.025)
Urobilinogen, UA: 0.2 E.U./dL
pH, UA: 6 (ref 5.0–8.0)

## 2019-03-17 MED ORDER — CIPROFLOXACIN HCL 250 MG PO TABS: 250.0000 mg | ORAL_TABLET | Freq: Two times a day (BID) | ORAL | 0 refills | Status: DC

## 2019-03-17 NOTE — Progress Notes (Signed)
Date:  03/17/2019   Name:  Danielle Taylor   DOB:  09-Sep-1945   MRN:  [id number]   Chief Complaint: Urinary Tract Infection (had a 3 days prescription of septra BID x 3 days on 02/24/2019 for UTI with urology. Will see them again on August 4th. Having alot of pressure in lower abdomen and burning when urinates.)  Urinary Tract Infection  This is a recurrent problem. The current episode started in the past 7 days. The problem occurs every urination. The problem has been unchanged. The pain is mild (suprapubic pressure). There has been no fever. Associated symptoms include frequency and urgency. Pertinent negatives include no chills, discharge, flank pain, hematuria, hesitancy, nausea, sweats or vomiting. She has tried antibiotics (septra) for the symptoms. The treatment provided mild relief.    Review of Systems  Constitutional: Negative.  Negative for chills, fatigue, fever and unexpected weight change.  HENT: Negative for congestion, ear discharge, ear pain, rhinorrhea, sinus pressure, sneezing and sore throat.   Eyes: Negative for photophobia, pain, discharge, redness and itching.  Respiratory: Negative for cough, shortness of breath, wheezing and stridor.   Gastrointestinal: Negative for abdominal pain, blood in stool, constipation, diarrhea, nausea and vomiting.  Endocrine: Negative for cold intolerance, heat intolerance, polydipsia, polyphagia and polyuria.  Genitourinary: Positive for frequency and urgency. Negative for dysuria, flank pain, hematuria, hesitancy, menstrual problem, pelvic pain, vaginal bleeding and vaginal discharge.  Musculoskeletal: Negative for arthralgias, back pain and myalgias.  Skin: Negative for rash.  Allergic/Immunologic: Negative for environmental allergies and food allergies.  Neurological: Negative for dizziness, weakness, light-headedness, numbness and headaches.  Hematological: Negative for adenopathy. Does not bruise/bleed easily.    Psychiatric/Behavioral: Negative for dysphoric mood. The patient is not nervous/anxious.     Patient Active Problem List   Diagnosis Date Noted   Anticoagulation adequate with anticoagulant therapy 10/28/2018   Complete heart block, transient ([place]) 10/28/2018   COPD (chronic obstructive pulmonary disease) ([place]) 10/28/2018   Penicillin allergy 10/28/2018   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   Lumbar radiculopathy 06/29/2018   Chronic low back pain with sciatica 02/15/2018   Reactive airway disease, mild intermittent, uncomplicated 64/33/2951   Chronic seasonal allergic rhinitis due to pollen 08/10/2017   Mixed hyperlipidemia 08/10/2017   Depression 08/10/2017   Class 1 obesity due to excess calories without serious comorbidity with body mass index (BMI) of 34.0 to 34.9 in adult 08/10/2017   Chronic cough 06/15/2017   Interstitial lung disease ([place]) 06/15/2017   Centrilobular emphysema ([place]) 04/02/2017   Lumbar disc disease 04/02/2017   Hepatic steatosis 04/02/2017   Atherosclerosis of coronary artery of native heart 04/02/2017   Cardiac pacemaker in situ 01/06/2017   Paroxysmal A-fib ([place]) 03/30/2016   Syncope and collapse 03/11/2016   Right leg weakness 03/11/2016   Essential hypertension 03/11/2016   Hyperglycemia 03/11/2016   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 05/15/2015   Reactive airway disease 03/04/2015   Hypothyroid 12/07/2014   Familial multiple lipoprotein-type hyperlipidemia 12/07/2014   Acute bronchitis 12/07/2014   Recurrent major depressive episodes ([place]) 12/07/2014   Essential (primary) hypertension 12/07/2014   H/O endocrine disorder 12/07/2014   Pre-operative examination 12/07/2014   History of depression 09/22/2012   Fitting or adjustment of cardiac pacemaker 12/02/2011   Chronic gastritis 07/24/1998    Allergies  Allergen Reactions   Penicillins Other (See Comments)     Has patient had a PCN reaction causing immediate rash, facial/tongue/throat swelling, SOB or lightheadedness with hypotension: No  Has patient had a PCN reaction causing severe rash involving mucus membranes or skin necrosis: No Has patient had a PCN reaction that required hospitalization No Has patient had a PCN reaction occurring within the last 10 years: No If all of the above answers are "NO", then may proceed with Cephalosporin use.     Past Surgical History:  Procedure Laterality Date   APPENDECTOMY     COLONOSCOPY  2000   COLONOSCOPY WITH PROPOFOL N/A 04/26/2015   Procedure: COLONOSCOPY WITH PROPOFOL;  Surgeon: Hulen Luster, MD;  Location: [facility] ENDOSCOPY;  Service: Gastroenterology;  Laterality: N/A;   fibroid tumors     fingers and wrist   FOOT SURGERY     TONSILLECTOMY     TRANSFORAMINAL LUMBAR INTERBODY FUSION (TLIF) WITH PEDICLE SCREW FIXATION 1 LEVEL N/A 06/29/2018   Procedure: TRANSFORAMINAL LUMBAR INTERBODY FUSION (TLIF) WITH PEDICLE SCREW FIXATION 1 LEVEL;  Surgeon: [name], MD;  Location: [facility] ORS;  Service: Neurosurgery;  Laterality: N/A;   VAGINAL HYSTERECTOMY      Social History   Tobacco Use   Smoking status: Former Smoker    Packs/day: 0.50    Years: 33.00    Pack years: 16.50    Types: Cigarettes    Quit date: 2003    Years since quitting: 17.5   Smokeless tobacco: Never Used   Tobacco comment: smoking cessation materials not required  Substance Use Topics   Alcohol use: Not Currently    Alcohol/week: 0.0 standard drinks    Comment: occassional   Drug use: No     Medication list has been reviewed and updated.  Current Meds  Medication Sig   acetaminophen (TYLENOL) 500 MG tablet Take 1,000 mg by mouth every 8 (eight) hours as needed (pain).   citalopram (CELEXA) 40 MG tablet Take 1 tablet (40 mg total) by mouth daily.   gabapentin (NEURONTIN) 300 MG capsule Take 1 capsule (300 mg total) by mouth 3 (three) times daily. (Patient  taking differently: Take 300 mg by mouth 3 (three) times daily. Dr Cari Caraway)   gemfibrozil (LOPID) 600 MG tablet Take 1 tablet (600 mg total) by mouth daily.   levothyroxine (SYNTHROID) 112 MCG tablet Take 1 tablet (112 mcg total) by mouth daily.   loratadine (CLARITIN) 10 MG tablet Take 1 tablet (10 mg total) by mouth daily.   losartan (COZAAR) 25 MG tablet Take 2 tablets (50 mg total) by mouth daily.   metoprolol succinate (TOPROL-XL) 50 MG 24 hr tablet TAKE 1 TABLET BY MOUTH EVERY DAY   montelukast (SINGULAIR) 10 MG tablet TAKE 1 TABLET BY MOUTH EVERYDAY AT BEDTIME   Multiple Vitamins-Minerals (CENTRUM SILVER 50+WOMEN PO) Take 1 tablet by mouth daily.   nystatin cream (MYCOSTATIN) APPLY TO AFFECTED AREA TWICE A DAY   rivaroxaban (XARELTO) 20 MG TABS tablet Take 20 mg by mouth daily with supper.   simvastatin (ZOCOR) 20 MG tablet TAKE 1 TABLET BY MOUTH EVERY DAY   umeclidinium-vilanterol (ANORO ELLIPTA) 62.5-25 MCG/INH AEPB Inhale 1 puff into the lungs daily.    Current Facility-Administered Medications for the 03/17/19 encounter (Office Visit) with Juline Patch, MD  Medication   albuterol (PROVENTIL) (2.5 MG/3ML) 0.083% nebulizer solution 2.5 mg   ipratropium-albuterol (DUONEB) 0.5-2.5 (3) MG/3ML nebulizer solution 3 mL    PHQ 2/9 Scores 02/15/2019 01/25/2019 05/03/2018 11/15/2017  PHQ - 2 Score 0 0 0 0  PHQ- 9 Score 0 - 0 0    BP Readings from Last 3 Encounters:  03/17/19 120/70  02/15/19 120/70  01/25/19 (!) 142/82    Physical Exam Vitals signs and nursing note reviewed.  Constitutional:      Appearance: She is well-developed.  HENT:     Head: Normocephalic.     Right Ear: Tympanic membrane, ear canal and external ear normal.     Left Ear: Tympanic membrane, ear canal and external ear normal.     Nose: Nose normal.  Eyes:     General: Lids are everted, no foreign bodies appreciated. No scleral icterus.       Left eye: No foreign body or hordeolum.      Conjunctiva/sclera: Conjunctivae normal.     Right eye: Right conjunctiva is not injected.     Left eye: Left conjunctiva is not injected.     Pupils: Pupils are equal, round, and reactive to light.  Neck:     Musculoskeletal: Normal range of motion and neck supple.     Thyroid: No thyromegaly.     Vascular: No JVD.     Trachea: No tracheal deviation.  Cardiovascular:     Rate and Rhythm: Normal rate and regular rhythm.     Heart sounds: Normal heart sounds. No murmur. No friction rub. No gallop.   Pulmonary:     Effort: Pulmonary effort is normal. No respiratory distress.     Breath sounds: Normal breath sounds. No wheezing or rales.  Abdominal:     General: Bowel sounds are normal.     Palpations: Abdomen is soft. There is no hepatomegaly, splenomegaly or mass.     Tenderness: There is abdominal tenderness in the suprapubic area. There is no guarding or rebound.  Musculoskeletal: Normal range of motion.        General: No tenderness.  Lymphadenopathy:     Cervical: No cervical adenopathy.  Skin:    General: Skin is warm.     Findings: No rash.  Neurological:     Mental Status: She is alert and oriented to person, place, and time.     Cranial Nerves: No cranial nerve deficit.     Deep Tendon Reflexes: Reflexes normal.  Psychiatric:        Mood and Affect: Mood is not anxious or depressed.     Wt Readings from Last 3 Encounters:  03/17/19 167 lb (75.8 kg)  02/15/19 169 lb (76.7 kg)  01/25/19 171 lb (77.6 kg)    BP 120/70    Pulse 68    Ht 5\' 4"  (1.626 m)    Wt 167 lb (75.8 kg)    BMI 28.67 kg/m   Assessment and Plan:  1. Cystitis Patient with history of cystitis and with acute symptoms today.  Will initiate Cipro 250 mg 1 twice a day following evaluation by urinalysis.  In the recurrent nature we will check a urine culture. - POCT urinalysis dipstick - Urine Culture

## 2019-03-19 LAB — URINE CULTURE

## 2019-03-24 ENCOUNTER — Telehealth: Payer: Self-pay

## 2019-03-24 NOTE — Telephone Encounter (Signed)
Patient called our office requesting her 3rd round of abx for a UTI she says have came back again. She has been unable to urinate for the past 12 hours and is having back pain. Was suggested to go to the ER per Dr Ronnald Ramp for possible catheterization and evaluation for annuria but patient is refusing. Told her if she refusing to go the ER then she will have to call her urologist.  She verbalized understanding.

## 2019-03-28 DIAGNOSIS — N318 Other neuromuscular dysfunction of bladder: Secondary | ICD-10-CM | POA: Diagnosis not present

## 2019-03-28 DIAGNOSIS — N319 Neuromuscular dysfunction of bladder, unspecified: Secondary | ICD-10-CM | POA: Diagnosis not present

## 2019-03-28 DIAGNOSIS — N393 Stress incontinence (female) (male): Secondary | ICD-10-CM | POA: Diagnosis not present

## 2019-03-28 DIAGNOSIS — N398 Other specified disorders of urinary system: Secondary | ICD-10-CM | POA: Diagnosis not present

## 2019-03-28 DIAGNOSIS — N816 Rectocele: Secondary | ICD-10-CM | POA: Diagnosis not present

## 2019-03-28 DIAGNOSIS — K5901 Slow transit constipation: Secondary | ICD-10-CM | POA: Diagnosis not present

## 2019-03-28 DIAGNOSIS — N3946 Mixed incontinence: Secondary | ICD-10-CM | POA: Diagnosis not present

## 2019-04-06 DIAGNOSIS — Z4689 Encounter for fitting and adjustment of other specified devices: Secondary | ICD-10-CM | POA: Diagnosis not present

## 2019-04-06 DIAGNOSIS — N8111 Cystocele, midline: Secondary | ICD-10-CM | POA: Diagnosis not present

## 2019-04-06 DIAGNOSIS — N816 Rectocele: Secondary | ICD-10-CM | POA: Diagnosis not present

## 2019-04-06 DIAGNOSIS — N952 Postmenopausal atrophic vaginitis: Secondary | ICD-10-CM | POA: Diagnosis not present

## 2019-04-06 DIAGNOSIS — B009 Herpesviral infection, unspecified: Secondary | ICD-10-CM | POA: Diagnosis not present

## 2019-04-06 DIAGNOSIS — N3946 Mixed incontinence: Secondary | ICD-10-CM | POA: Diagnosis not present

## 2019-04-06 DIAGNOSIS — R8290 Unspecified abnormal findings in urine: Secondary | ICD-10-CM | POA: Diagnosis not present

## 2019-04-06 DIAGNOSIS — N9089 Other specified noninflammatory disorders of vulva and perineum: Secondary | ICD-10-CM | POA: Diagnosis not present

## 2019-05-12 ENCOUNTER — Other Ambulatory Visit: Payer: Self-pay

## 2019-05-12 ENCOUNTER — Ambulatory Visit (INDEPENDENT_AMBULATORY_CARE_PROVIDER_SITE_OTHER): Payer: Medicare Other

## 2019-05-12 DIAGNOSIS — Z23 Encounter for immunization: Secondary | ICD-10-CM

## 2019-05-25 DIAGNOSIS — I5189 Other ill-defined heart diseases: Secondary | ICD-10-CM | POA: Insufficient documentation

## 2019-05-25 DIAGNOSIS — Z981 Arthrodesis status: Secondary | ICD-10-CM | POA: Diagnosis not present

## 2019-05-25 DIAGNOSIS — M4316 Spondylolisthesis, lumbar region: Secondary | ICD-10-CM | POA: Diagnosis not present

## 2019-05-25 DIAGNOSIS — M4326 Fusion of spine, lumbar region: Secondary | ICD-10-CM | POA: Diagnosis not present

## 2019-06-12 DIAGNOSIS — Z87891 Personal history of nicotine dependence: Secondary | ICD-10-CM | POA: Diagnosis not present

## 2019-06-12 DIAGNOSIS — Z7901 Long term (current) use of anticoagulants: Secondary | ICD-10-CM | POA: Diagnosis not present

## 2019-06-12 DIAGNOSIS — I498 Other specified cardiac arrhythmias: Secondary | ICD-10-CM | POA: Diagnosis not present

## 2019-06-12 DIAGNOSIS — Z45018 Encounter for adjustment and management of other part of cardiac pacemaker: Secondary | ICD-10-CM | POA: Diagnosis not present

## 2019-06-12 DIAGNOSIS — I2510 Atherosclerotic heart disease of native coronary artery without angina pectoris: Secondary | ICD-10-CM | POA: Diagnosis not present

## 2019-06-12 DIAGNOSIS — I442 Atrioventricular block, complete: Secondary | ICD-10-CM | POA: Diagnosis not present

## 2019-06-12 DIAGNOSIS — Z950 Presence of cardiac pacemaker: Secondary | ICD-10-CM | POA: Diagnosis not present

## 2019-06-12 DIAGNOSIS — I480 Paroxysmal atrial fibrillation: Secondary | ICD-10-CM | POA: Diagnosis not present

## 2019-06-22 ENCOUNTER — Other Ambulatory Visit: Payer: Self-pay | Admitting: Family Medicine

## 2019-06-22 DIAGNOSIS — E039 Hypothyroidism, unspecified: Secondary | ICD-10-CM

## 2019-07-04 DIAGNOSIS — H25013 Cortical age-related cataract, bilateral: Secondary | ICD-10-CM | POA: Diagnosis not present

## 2019-07-06 DIAGNOSIS — I442 Atrioventricular block, complete: Secondary | ICD-10-CM | POA: Diagnosis not present

## 2019-07-06 DIAGNOSIS — Z7689 Persons encountering health services in other specified circumstances: Secondary | ICD-10-CM | POA: Diagnosis not present

## 2019-07-06 DIAGNOSIS — E7800 Pure hypercholesterolemia, unspecified: Secondary | ICD-10-CM | POA: Diagnosis not present

## 2019-07-06 DIAGNOSIS — I2510 Atherosclerotic heart disease of native coronary artery without angina pectoris: Secondary | ICD-10-CM | POA: Diagnosis not present

## 2019-07-06 DIAGNOSIS — I480 Paroxysmal atrial fibrillation: Secondary | ICD-10-CM | POA: Diagnosis not present

## 2019-07-06 DIAGNOSIS — I10 Essential (primary) hypertension: Secondary | ICD-10-CM | POA: Diagnosis not present

## 2019-07-24 ENCOUNTER — Other Ambulatory Visit: Payer: Self-pay

## 2019-07-24 MED ORDER — OXYBUTYNIN CHLORIDE ER 10 MG PO TB24: 10.0000 mg | ORAL_TABLET | Freq: Every day | ORAL | 0 refills | Status: DC

## 2019-07-24 MED ORDER — GABAPENTIN 100 MG PO CAPS: 100.0000 mg | ORAL_CAPSULE | Freq: Two times a day (BID) | ORAL | 0 refills | Status: DC

## 2019-07-24 NOTE — Progress Notes (Unsigned)
Fill Oxybutinin 10mg  1 Qday # 30 and Gabapentin 100mg  bid # 60 with no refills- pt has been told she needs to get in with her urologist before 30 days are up

## 2019-07-31 ENCOUNTER — Ambulatory Visit: Payer: Medicare Other | Admitting: Family Medicine

## 2019-08-12 ENCOUNTER — Other Ambulatory Visit: Payer: Self-pay | Admitting: Family Medicine

## 2019-08-12 DIAGNOSIS — F32A Depression, unspecified: Secondary | ICD-10-CM

## 2019-08-12 DIAGNOSIS — F329 Major depressive disorder, single episode, unspecified: Secondary | ICD-10-CM

## 2019-09-01 ENCOUNTER — Other Ambulatory Visit: Payer: Self-pay

## 2019-09-01 ENCOUNTER — Ambulatory Visit (INDEPENDENT_AMBULATORY_CARE_PROVIDER_SITE_OTHER): Payer: Medicare Other | Admitting: Urology

## 2019-09-01 ENCOUNTER — Encounter: Payer: Self-pay | Admitting: Urology

## 2019-09-01 VITALS — BP 154/83 | HR 64 | Ht 64.0 in | Wt 172.0 lb

## 2019-09-01 DIAGNOSIS — N8110 Cystocele, unspecified: Secondary | ICD-10-CM | POA: Diagnosis not present

## 2019-09-01 DIAGNOSIS — N3946 Mixed incontinence: Secondary | ICD-10-CM

## 2019-09-01 MED ORDER — OXYBUTYNIN CHLORIDE ER 10 MG PO TB24: 10.0000 mg | ORAL_TABLET | Freq: Every day | ORAL | 3 refills | Status: DC

## 2019-09-01 NOTE — Progress Notes (Signed)
09/01/2019 10:16 AM   Danielle Taylor 1945/09/05 [id number]  Referring provider: Juline Patch, MD [address] [place],  [place] [address]  Chief Complaint  Patient presents with  . Urinary Incontinence    HPI:  F/u mixed incontinence. She leaks when she stands and when she sleeps. She voids with straining and doesn't really get the urge to void until it's too late. She has urgency and UUI. She has frequency. She leaks with cough and sneeze. Some of these issue were going on pre-op. Wearing 6-8 ppd. Her bowels are regular now. She was on oxybutynin 10 mg and we bumped it up to 15 mg daily.   She has had a hysterectomy. NG risk includes L4-L5 fusion Jun 29, 2018 with post-op cauda equina and now residual LE weakness. She ambulates with a walker.   Bladder scan 153 ml. She did not void prior. UA Nov 2019 was normal, pale.She strained to give a small amount of urine. PVR 300 ml. She's not bothered by prolapse symptoms. She leaks without awareness. Cystoscopy Mar 2020 benign with no SUI, +UHM, +rectocele. Urodynamics   She underwent urodynamics which showed a large capacity hyposensitive bladder, mild stress incontinence and a hypotonic/flaccid bladder (capacity 675 mL, first sensation 515 mL, instability at max, no leak.  No desire to void.  Valsalva leak point pressure of 107 cm of water with mild leak, no leak at 65 to 70 cm of water but unstable.  Voided with a max bladder contraction of 8 cm of water, flow of 2 cc/s.  Straining and EMG flare).  She saw Dr. Lurena Nida and Dr. Orinda Kenner at [facility]. They stopped tamsulosin and oxybutynin, continued timed voiding, , decreased caffeine, added miralax, fit for a pessary, started vaginal moisturizers/TV estrogen and started Myrbetriq. She took the pessary out. No change. They also did UDS (There was not evidence of DO without DOI. The patient did demonstrate stress incontinence with LPP of 58 without with with barrier reduction.  1.  Uroflow Impression: normal post void residual  2. Cystometrogram Summary: no DO 3. Stress Testing Impressions: LPP of 58 at 253cc  4. Urethral Pressure Profile Summary: MUCP =57 5. Pressure Flow Summary: underactive detrusor with normal PVR, increased pelvic floor)  She returns today and went back to oxybutynin as Myrbetriq was too expensive. She is doing timed voiding but no in control of her urination. She leaks. She wears diaper. She gets a sudden urge and leaks a lot on the way to bathroom. Wet in the morning after sleeping.    PMH: Past Medical History:  Diagnosis Date  . Allergy   . COPD (chronic obstructive pulmonary disease) ([place])   . Depression   . Dysrhythmia    Complete Heart Block  . Gastritis, chronic   . GERD (gastroesophageal reflux disease)   . Graves disease   . History of hiatal hernia   . Hyperlipidemia   . Hypertension   . Hypothyroidism   . Incontinence    bladder and bowel  . Presence of permanent cardiac pacemaker    2012    Surgical History: Past Surgical History:  Procedure Laterality Date  . APPENDECTOMY    . COLONOSCOPY  2000  . COLONOSCOPY WITH PROPOFOL N/A 04/26/2015   Procedure: COLONOSCOPY WITH PROPOFOL;  Surgeon: Hulen Luster, MD;  Location: [facility] ENDOSCOPY;  Service: Gastroenterology;  Laterality: N/A;  . fibroid tumors     fingers and wrist  . FOOT SURGERY    . TONSILLECTOMY    .  TRANSFORAMINAL LUMBAR INTERBODY FUSION (TLIF) WITH PEDICLE SCREW FIXATION 1 LEVEL N/A 06/29/2018   Procedure: TRANSFORAMINAL LUMBAR INTERBODY FUSION (TLIF) WITH PEDICLE SCREW FIXATION 1 LEVEL;  Surgeon: [name], MD;  Location: [facility] ORS;  Service: Neurosurgery;  Laterality: N/A;  . VAGINAL HYSTERECTOMY      Home Medications:  Allergies as of 09/01/2019      Reactions   Penicillins Other (See Comments)   Has patient had a PCN reaction causing immediate rash, facial/tongue/throat swelling, SOB or lightheadedness with hypotension: No Has patient had a PCN  reaction causing severe rash involving mucus membranes or skin necrosis: No Has patient had a PCN reaction that required hospitalization No Has patient had a PCN reaction occurring within the last 10 years: No If all of the above answers are "NO", then may proceed with Cephalosporin use.      Medication List       Accurate as of September 01, 2019 10:16 AM. If you have any questions, ask your nurse or doctor.        acetaminophen 500 MG tablet Commonly known as: TYLENOL Take 1,000 mg by mouth every 8 (eight) hours as needed (pain).   Anoro Ellipta 62.5-25 MCG/INH Aepb Generic drug: umeclidinium-vilanterol Inhale 1 puff into the lungs daily.   CENTRUM SILVER 50+WOMEN PO Take 1 tablet by mouth daily.   ciprofloxacin 250 MG tablet Commonly known as: Cipro Take 1 tablet (250 mg total) by mouth 2 (two) times daily.   citalopram 40 MG tablet Commonly known as: CELEXA TAKE 1 TABLET BY MOUTH EVERY DAY   gabapentin 100 MG capsule Commonly known as: NEURONTIN Take 1 capsule (100 mg total) by mouth 2 (two) times daily.   gemfibrozil 600 MG tablet Commonly known as: LOPID Take 1 tablet (600 mg total) by mouth daily.   levothyroxine 112 MCG tablet Commonly known as: SYNTHROID TAKE 1 TABLET BY MOUTH EVERY DAY   loratadine 10 MG tablet Commonly known as: CLARITIN Take 1 tablet (10 mg total) by mouth daily.   losartan 25 MG tablet Commonly known as: COZAAR Take 2 tablets (50 mg total) by mouth daily.   metoprolol succinate 50 MG 24 hr tablet Commonly known as: TOPROL-XL TAKE 1 TABLET BY MOUTH EVERY DAY   montelukast 10 MG tablet Commonly known as: SINGULAIR TAKE 1 TABLET BY MOUTH EVERYDAY AT BEDTIME   nystatin cream Commonly known as: MYCOSTATIN APPLY TO AFFECTED AREA TWICE A DAY   oxybutynin 10 MG 24 hr tablet Commonly known as: DITROPAN-XL Take 1 tablet by mouth daily.   oxybutynin 10 MG 24 hr tablet Commonly known as: DITROPAN-XL Take 1 tablet (10 mg total) by  mouth at bedtime.   simvastatin 20 MG tablet Commonly known as: ZOCOR TAKE 1 TABLET BY MOUTH EVERY DAY   Xarelto 20 MG Tabs tablet Generic drug: rivaroxaban Take 20 mg by mouth daily with supper.       Allergies:  Allergies  Allergen Reactions  . Penicillins Other (See Comments)    Has patient had a PCN reaction causing immediate rash, facial/tongue/throat swelling, SOB or lightheadedness with hypotension: No Has patient had a PCN reaction causing severe rash involving mucus membranes or skin necrosis: No Has patient had a PCN reaction that required hospitalization No Has patient had a PCN reaction occurring within the last 10 years: No If all of the above answers are "NO", then may proceed with Cephalosporin use.     Family History: Family History  Problem Relation Age of Onset  .  Dementia Mother   . Heart disease Father   . Diabetes Sister   . Breast cancer Neg Hx     Social History:  reports that she quit smoking about 18 years ago. Her smoking use included cigarettes. She has a 16.50 pack-year smoking history. She has never used smokeless tobacco. She reports previous alcohol use. She reports that she does not use drugs.  ROS:                                        Physical Exam: Ht 5\' 4"  (1.626 m)   BMI 28.67 kg/m   Constitutional:  Alert and oriented, No acute distress. HEENT: [place] AT, moist mucus membranes.  Trachea midline, no masses. Cardiovascular: No clubbing, cyanosis, or edema. Respiratory: Normal respiratory effort, no increased work of breathing. GI: Abdomen is soft, nontender, nondistended, no abdominal masses GU: No CVA tenderness Skin: No rashes, bruises or suspicious lesions. Neurologic: Grossly intact, no focal deficits, moving all 4 extremities. Psychiatric: Normal mood and affect.  Laboratory Data: Lab Results  Component Value Date   WBC 7.1 06/23/2018   HGB 14.1 06/23/2018   HCT 42.2 06/23/2018   MCV 100.0  06/23/2018   PLT 293 06/23/2018    Lab Results  Component Value Date   CREATININE 0.83 02/15/2019    No results found for: PSA  No results found for: TESTOSTERONE  Lab Results  Component Value Date   HGBA1C 6.3 (H) 03/11/2016    Urinalysis    Component Value Date/Time   COLORURINE COLORLESS (A) 06/23/2018 1409   APPEARANCEUR Cloudy (A) 10/28/2018 1100   LABSPEC 1.005 06/23/2018 1409   PHURINE 6.0 06/23/2018 1409   GLUCOSEU Negative 10/28/2018 1100   HGBUR SMALL (A) 06/23/2018 1409   BILIRUBINUR positive 03/17/2019 0956   BILIRUBINUR Negative 10/28/2018 1100   KETONESUR NEGATIVE 06/23/2018 1409   PROTEINUR Negative 03/17/2019 0956   PROTEINUR Negative 10/28/2018 1100   PROTEINUR NEGATIVE 06/23/2018 1409   UROBILINOGEN 0.2 03/17/2019 0956   NITRITE negative 03/17/2019 0956   NITRITE Positive (A) 10/28/2018 1100   NITRITE NEGATIVE 06/23/2018 1409   LEUKOCYTESUR Negative 03/17/2019 0956   LEUKOCYTESUR 2+ (A) 10/28/2018 1100    Lab Results  Component Value Date   LABMICR See below: 10/28/2018   WBCUA >30 (H) 10/28/2018   RBCUA None seen 10/28/2018   LABEPIT >10 (H) 10/28/2018   MUCUS Present (A) 10/28/2018   BACTERIA Many (A) 10/28/2018    Pertinent Imaging: N/a Reviewed UDS at [facility] notes at [facility] No results found for this or any previous visit. No results found for this or any previous visit. No results found for this or any previous visit. No results found for this or any previous visit. No results found for this or any previous visit. No results found for this or any previous visit. No results found for this or any previous visit. No results found for this or any previous visit.  Assessment & Plan:    Mixed incontinence - continue oxybutynin and timed voiding. Discussed PT/OT referral. Also discussed nature r/b/a to PTNS. She will consider.   Prolapse - discussed referral for prolapse repair consideration.  Repair may or may not change her bladder function (sensation, voiding and incontinence) as there was not much change in bladder function with the pessary.   No follow-ups on file.  Festus Aloe,  MD  [place] [address], [place], [place] [address] [phone]

## 2019-09-01 NOTE — Patient Instructions (Signed)

## 2019-09-06 ENCOUNTER — Other Ambulatory Visit: Payer: Self-pay | Admitting: Family Medicine

## 2019-09-06 DIAGNOSIS — F329 Major depressive disorder, single episode, unspecified: Secondary | ICD-10-CM

## 2019-09-06 DIAGNOSIS — F32A Depression, unspecified: Secondary | ICD-10-CM

## 2019-09-07 ENCOUNTER — Other Ambulatory Visit: Payer: Self-pay

## 2019-09-07 ENCOUNTER — Encounter: Payer: Self-pay | Admitting: Family Medicine

## 2019-09-07 ENCOUNTER — Ambulatory Visit (INDEPENDENT_AMBULATORY_CARE_PROVIDER_SITE_OTHER): Payer: Medicare Other | Admitting: Family Medicine

## 2019-09-07 VITALS — BP 120/60 | HR 68 | Ht 64.0 in | Wt 183.0 lb

## 2019-09-07 DIAGNOSIS — E782 Mixed hyperlipidemia: Secondary | ICD-10-CM

## 2019-09-07 DIAGNOSIS — J4520 Mild intermittent asthma, uncomplicated: Secondary | ICD-10-CM | POA: Diagnosis not present

## 2019-09-07 DIAGNOSIS — G8929 Other chronic pain: Secondary | ICD-10-CM

## 2019-09-07 DIAGNOSIS — E039 Hypothyroidism, unspecified: Secondary | ICD-10-CM

## 2019-09-07 DIAGNOSIS — M545 Low back pain, unspecified: Secondary | ICD-10-CM

## 2019-09-07 DIAGNOSIS — I10 Essential (primary) hypertension: Secondary | ICD-10-CM | POA: Diagnosis not present

## 2019-09-07 DIAGNOSIS — L03116 Cellulitis of left lower limb: Secondary | ICD-10-CM

## 2019-09-07 DIAGNOSIS — F32A Depression, unspecified: Secondary | ICD-10-CM

## 2019-09-07 DIAGNOSIS — J301 Allergic rhinitis due to pollen: Secondary | ICD-10-CM

## 2019-09-07 DIAGNOSIS — E7849 Other hyperlipidemia: Secondary | ICD-10-CM

## 2019-09-07 DIAGNOSIS — F329 Major depressive disorder, single episode, unspecified: Secondary | ICD-10-CM

## 2019-09-07 DIAGNOSIS — L0291 Cutaneous abscess, unspecified: Secondary | ICD-10-CM

## 2019-09-07 MED ORDER — SIMVASTATIN 20 MG PO TABS: 20.0000 mg | ORAL_TABLET | Freq: Every day | ORAL | 1 refills | Status: DC

## 2019-09-07 MED ORDER — LOSARTAN POTASSIUM 25 MG PO TABS: 50.0000 mg | ORAL_TABLET | Freq: Every day | ORAL | 1 refills | Status: DC

## 2019-09-07 MED ORDER — LORATADINE 10 MG PO TABS: 10.0000 mg | ORAL_TABLET | Freq: Every day | ORAL | 1 refills | Status: DC

## 2019-09-07 MED ORDER — LEVOTHYROXINE SODIUM 112 MCG PO TABS: 112.0000 ug | ORAL_TABLET | Freq: Every day | ORAL | 1 refills | Status: DC

## 2019-09-07 MED ORDER — DOXYCYCLINE HYCLATE 100 MG PO TABS: 100.0000 mg | ORAL_TABLET | Freq: Two times a day (BID) | ORAL | 0 refills | Status: DC

## 2019-09-07 MED ORDER — METOPROLOL SUCCINATE ER 50 MG PO TB24: ORAL_TABLET | ORAL | 1 refills | Status: DC

## 2019-09-07 MED ORDER — GEMFIBROZIL 600 MG PO TABS: 600.0000 mg | ORAL_TABLET | Freq: Every day | ORAL | 1 refills | Status: DC

## 2019-09-07 MED ORDER — CITALOPRAM HYDROBROMIDE 40 MG PO TABS: 40.0000 mg | ORAL_TABLET | Freq: Every day | ORAL | 1 refills | Status: DC

## 2019-09-07 MED ORDER — MONTELUKAST SODIUM 10 MG PO TABS: ORAL_TABLET | ORAL | 1 refills | Status: DC

## 2019-09-07 MED ORDER — GABAPENTIN 100 MG PO CAPS: 100.0000 mg | ORAL_CAPSULE | Freq: Two times a day (BID) | ORAL | 0 refills | Status: DC

## 2019-09-07 NOTE — Progress Notes (Signed)
Date:  09/07/2019   Name:  Danielle Taylor   DOB:  01/05/1946   MRN:  [id number]   Chief Complaint: Depression, Hypothyroidism, Hypertension, Hyperlipidemia, and Leg Pain (Fell and hit leg on a table- red and inflamed)  Depression        This is a chronic problem.  The current episode started more than 1 year ago.   The onset quality is gradual.   The problem occurs intermittently.  The problem has been gradually improving since onset.  Associated symptoms include no decreased concentration, no fatigue, no helplessness, no hopelessness, does not have insomnia, not irritable, no restlessness, no decreased interest, no appetite change, no body aches, no myalgias, no headaches, no indigestion, not sad and no suicidal ideas.     The symptoms are aggravated by nothing.  Past treatments include SSRIs - Selective serotonin reuptake inhibitors.  Compliance with treatment is variable.  Previous treatment provided moderate relief.  Past medical history includes thyroid problem.     Pertinent negatives include no hypothyroidism and no anxiety. Hypertension This is a chronic problem. The current episode started more than 1 year ago. The problem has been gradually improving since onset. The problem is controlled. Pertinent negatives include no anxiety, blurred vision, chest pain, headaches, malaise/fatigue, neck pain, orthopnea, palpitations, peripheral edema, PND, shortness of breath or sweats. There are no associated agents to hypertension. There are no known risk factors for coronary artery disease. Past treatments include beta blockers and angiotensin blockers. The current treatment provides moderate improvement. There are no compliance problems.  There is no history of angina, kidney disease, CAD/MI, CVA, heart failure, left ventricular hypertrophy, PVD or retinopathy. Identifiable causes of hypertension include a thyroid problem. There is no history of chronic renal disease, a hypertension causing med or  renovascular disease.  Hyperlipidemia This is a chronic problem. The current episode started more than 1 year ago. The problem is controlled. Recent lipid tests were reviewed and are normal. She has no history of chronic renal disease, diabetes, hypothyroidism, liver disease or nephrotic syndrome. Associated symptoms include leg pain. Pertinent negatives include no chest pain, focal sensory loss, focal weakness, myalgias or shortness of breath. Current antihyperlipidemic treatment includes statins. The current treatment provides moderate improvement of lipids. There are no compliance problems.  Risk factors for coronary artery disease include dyslipidemia.  Leg Pain  The incident occurred more than 1 week ago. The injury mechanism was a direct blow and a fall (fell against the cart). The pain is present in the left leg. The quality of the pain is described as aching. The pain is moderate. The pain has been worsening since onset. Pertinent negatives include no inability to bear weight, loss of motion, loss of sensation or muscle weakness. The symptoms are aggravated by palpation and weight bearing. Treatments tried: gabapentin. The treatment provided no relief.  Thyroid Problem Presents for follow-up visit. Symptoms include leg swelling and weight gain. Patient reports no anxiety, cold intolerance, constipation, depressed mood, diaphoresis, diarrhea, dry skin, fatigue, hair loss, heat intolerance, hoarse voice, palpitations or weight loss. The symptoms have been stable. Her past medical history is significant for hyperlipidemia. There is no history of diabetes or heart failure.  Back Pain This is a chronic problem. The current episode started more than 1 year ago. The problem has been waxing and waning since onset. The pain is present in the lumbar spine. The quality of the pain is described as burning. The pain is moderate. Associated symptoms include  leg pain. Pertinent negatives include no chest pain,  headaches or weight loss. Treatments tried: gabapentin.    Lab Results  Component Value Date   CREATININE 0.83 02/15/2019   BUN 17 02/15/2019   NA 140 02/15/2019   K 4.7 02/15/2019   CL 101 02/15/2019   CO2 21 02/15/2019   Lab Results  Component Value Date   CHOL 214 (H) 02/15/2019   HDL 72 02/15/2019   LDLCALC 115 (H) 02/15/2019   TRIG 135 02/15/2019   CHOLHDL 3.5 02/08/2017   Lab Results  Component Value Date   TSH 0.095 (L) 02/15/2019   Lab Results  Component Value Date   HGBA1C 6.3 (H) 03/11/2016     Review of Systems  Constitutional: Positive for weight gain. Negative for appetite change, diaphoresis, fatigue, malaise/fatigue and weight loss.  HENT: Negative for hoarse voice.   Eyes: Negative for blurred vision.  Respiratory: Negative for shortness of breath.   Cardiovascular: Negative for chest pain, palpitations, orthopnea and PND.  Gastrointestinal: Negative for constipation and diarrhea.  Endocrine: Negative for cold intolerance and heat intolerance.  Musculoskeletal: Positive for back pain. Negative for myalgias and neck pain.  Neurological: Negative for focal weakness and headaches.  Psychiatric/Behavioral: Positive for depression. Negative for decreased concentration and suicidal ideas. The patient is not nervous/anxious and does not have insomnia.     Patient Active Problem List   Diagnosis Date Noted  . Anticoagulation adequate with anticoagulant therapy 10/28/2018  . Complete heart block, transient ([place]) 10/28/2018  . COPD (chronic obstructive pulmonary disease) ([place]) 10/28/2018  . Penicillin allergy 10/28/2018  . 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 Lumbar radiculopathy 06/29/2018  . Chronic low back pain with sciatica 02/15/2018  . Reactive airway disease, mild intermittent, uncomplicated [id number]  . Chronic seasonal allergic rhinitis due to pollen 08/10/2017  . Mixed hyperlipidemia 08/10/2017  . Depression 08/10/2017  . Class 1 obesity  due to excess calories without serious comorbidity with body mass index (BMI) of 34.0 to 34.9 in adult 08/10/2017  . Chronic cough 06/15/2017  . Interstitial lung disease ([place]) 06/15/2017  . Centrilobular emphysema ([place]) 04/02/2017  . Lumbar disc disease 04/02/2017  . Hepatic steatosis 04/02/2017  . Atherosclerosis of coronary artery of native heart 04/02/2017  . Cardiac pacemaker in situ 01/06/2017  . Paroxysmal A-fib ([place]) 03/30/2016  . Syncope and collapse 03/11/2016  . Right leg weakness 03/11/2016  . Essential hypertension 03/11/2016  . Hyperglycemia 03/11/2016  . 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 Reactive airway disease 03/04/2015  . Hypothyroid 12/07/2014  . Familial multiple lipoprotein-type hyperlipidemia 12/07/2014  . Acute bronchitis 12/07/2014  . Recurrent major depressive episodes ([place]) 12/07/2014  . Essential (primary) hypertension 12/07/2014  . H/O endocrine disorder 12/07/2014  . Pre-operative examination 12/07/2014  . History of depression 09/22/2012  . Fitting or adjustment of cardiac pacemaker 12/02/2011  . Chronic gastritis 07/24/1998    Allergies  Allergen Reactions  . Penicillins Other (See Comments)    Has patient had a PCN reaction causing immediate rash, facial/tongue/throat swelling, SOB or lightheadedness with hypotension: No Has patient had a PCN reaction causing severe rash involving mucus membranes or skin necrosis: No Has patient had a PCN reaction that required hospitalization No Has patient had a PCN reaction occurring within the last 10 years: No If all of the above answers are "NO", then may proceed with Cephalosporin use.     Past Surgical History:  Procedure Laterality Date  . APPENDECTOMY    .  COLONOSCOPY  2000  . COLONOSCOPY WITH PROPOFOL N/A 04/26/2015   Procedure: COLONOSCOPY WITH PROPOFOL;  Surgeon: Hulen Luster, MD;  Location: [facility] ENDOSCOPY;  Service: Gastroenterology;   Laterality: N/A;  . fibroid tumors     fingers and wrist  . FOOT SURGERY    . TONSILLECTOMY    . TRANSFORAMINAL LUMBAR INTERBODY FUSION (TLIF) WITH PEDICLE SCREW FIXATION 1 LEVEL N/A 06/29/2018   Procedure: TRANSFORAMINAL LUMBAR INTERBODY FUSION (TLIF) WITH PEDICLE SCREW FIXATION 1 LEVEL;  Surgeon: [name], MD;  Location: [facility] ORS;  Service: Neurosurgery;  Laterality: N/A;  . VAGINAL HYSTERECTOMY      Social History   Tobacco Use  . Smoking status: Former Smoker    Packs/day: 0.50    Years: 33.00    Pack years: 16.50    Types: Cigarettes    Quit date: 2003    Years since quitting: 18.0  . Smokeless tobacco: Never Used  . Tobacco comment: smoking cessation materials not required  Substance Use Topics  . Alcohol use: Not Currently    Alcohol/week: 0.0 standard drinks    Comment: occassional  . Drug use: No     Medication list has been reviewed and updated.  Current Meds  Medication Sig  . acetaminophen (TYLENOL) 500 MG tablet Take 1,000 mg by mouth every 8 (eight) hours as needed (pain).  . citalopram (CELEXA) 40 MG tablet TAKE 1 TABLET BY MOUTH EVERY DAY  . gabapentin (NEURONTIN) 100 MG capsule Take 1 capsule (100 mg total) by mouth 2 (two) times daily. (Patient taking differently: Take 300 mg by mouth 3 (three) times daily. )  . gemfibrozil (LOPID) 600 MG tablet Take 1 tablet (600 mg total) by mouth daily.  Marland Kitchen levothyroxine (SYNTHROID) 112 MCG tablet TAKE 1 TABLET BY MOUTH EVERY DAY  . loratadine (CLARITIN) 10 MG tablet Take 1 tablet (10 mg total) by mouth daily.  Marland Kitchen losartan (COZAAR) 25 MG tablet Take 2 tablets (50 mg total) by mouth daily.  . metoprolol succinate (TOPROL-XL) 50 MG 24 hr tablet TAKE 1 TABLET BY MOUTH EVERY DAY  . montelukast (SINGULAIR) 10 MG tablet TAKE 1 TABLET BY MOUTH EVERYDAY AT BEDTIME  . Multiple Vitamins-Minerals (CENTRUM SILVER 50+WOMEN PO) Take 1 tablet by mouth daily.  Marland Kitchen nystatin cream (MYCOSTATIN) APPLY TO AFFECTED AREA TWICE A DAY  .  oxybutynin (DITROPAN-XL) 10 MG 24 hr tablet Take 1 tablet (10 mg total) by mouth at bedtime.  . rivaroxaban (XARELTO) 20 MG TABS tablet Take 20 mg by mouth daily with supper.  . simvastatin (ZOCOR) 20 MG tablet TAKE 1 TABLET BY MOUTH EVERY DAY  . [DISCONTINUED] tamsulosin (FLOMAX) 0.4 MG CAPS capsule Take 0.4 mg by mouth.   Current Facility-Administered Medications for the 09/07/19 encounter (Office Visit) with Juline Patch, MD  Medication  . albuterol (PROVENTIL) (2.5 MG/3ML) 0.083% nebulizer solution 2.5 mg  . ipratropium-albuterol (DUONEB) 0.5-2.5 (3) MG/3ML nebulizer solution 3 mL    PHQ 2/9 Scores 09/07/2019 02/15/2019 01/25/2019 05/03/2018  PHQ - 2 Score 0 0 0 0  PHQ- 9 Score 0 0 - 0    BP Readings from Last 3 Encounters:  09/07/19 120/60  09/01/19 (!) 154/83  03/17/19 120/70    Physical Exam Constitutional:      General: She is not irritable.    Wt Readings from Last 3 Encounters:  09/07/19 183 lb (83 kg)  09/01/19 172 lb (78 kg)  03/17/19 167 lb (75.8 kg)    BP 120/60   Pulse 68  Ht 5\' 4"  (1.626 m)   Wt 183 lb (83 kg)   BMI 31.41 kg/m   Assessment and Plan:  1. Essential (primary) hypertension Chronic.  Controlled.  Stable.  We will continue patient on metoprolol XL 50 mg 1 a day and losartan 25 mg 2 tablets once a day.  We will check renal function panel.  We will also recheck in 6 months. - metoprolol succinate (TOPROL-XL) 50 MG 24 hr tablet; TAKE 1 TABLET BY MOUTH EVERY DAY  Dispense: 90 tablet; Refill: 1 - losartan (COZAAR) 25 MG tablet; Take 2 tablets (50 mg total) by mouth daily.  Dispense: 90 tablet; Refill: 1 - Renal Function Panel  2. Mixed hyperlipidemia Chronic.  Controlled.  Stable.  Continue simvastatin 20 mg once a day and gemfibrozil 200 mg once a day.  We will recheck lipids in 6 months - simvastatin (ZOCOR) 20 MG tablet; Take 1 tablet (20 mg total) by mouth daily.  Dispense: 90 tablet; Refill: 1 - gemfibrozil (LOPID) 600 MG tablet; Take 1  tablet (600 mg total) by mouth daily.  Dispense: 90 tablet; Refill: 1  3. Reactive airway disease, mild intermittent, uncomplicated Chronic.  Intermittent.  Mild/stable.  Continue Singulair 10 mg once a day. - montelukast (SINGULAIR) 10 MG tablet; TAKE 1 TABLET BY MOUTH EVERYDAY AT BEDTIME  Dispense: 90 tablet; Refill: 1  4. Chronic seasonal allergic rhinitis due to pollen Chronic.  Controlled.  Stable.  Episodic.  Continue Singulair 10 mg at bedtime and loratadine 10 mg every morning. - montelukast (SINGULAIR) 10 MG tablet; TAKE 1 TABLET BY MOUTH EVERYDAY AT BEDTIME  Dispense: 90 tablet; Refill: 1 - loratadine (CLARITIN) 10 MG tablet; Take 1 tablet (10 mg total) by mouth daily.  Dispense: 90 tablet; Refill: 1  5. Hypothyroidism, unspecified type Chronic.  Controlled.  Stable.  Will likely continue levothyroxine 112 mcg daily followed by recheck of her TSH and will adjust accordingly. - levothyroxine (SYNTHROID) 112 MCG tablet; Take 1 tablet (112 mcg total) by mouth daily.  Dispense: 90 tablet; Refill: 1 - TSH  6. Familial multiple lipoprotein-type hyperlipidemia Chronic.  Controlled.  Stable.  In addition to the above-mentioned gemfibrozil and simvastatin at current dosings.  We will check a lipid panel. - gemfibrozil (LOPID) 600 MG tablet; Take 1 tablet (600 mg total) by mouth daily.  Dispense: 90 tablet; Refill: 1 - Lipid Panel With LDL/HDL Ratio  7. Depression, unspecified depression type Chronic.  Controlled.  Stable.  PHQ score 0 gad score 0 continue citalopram 40 mg once a day. - citalopram (CELEXA) 40 MG tablet; Take 1 tablet (40 mg total) by mouth daily.  Dispense: 90 tablet; Refill: 1  8. Cellulitis of left lower extremity Patient developed redness about 2 to 3 days prior to coming to the clinic with discomfort.  This followed a puncture-like wound with a shopping cart at [employer].  Patient is allergic to penicillin and we will initiate doxycycline 100 mg twice a day. - Ambulatory  referral to Orthopedic Surgery - doxycycline (VIBRA-TABS) 100 MG tablet; Take 1 tablet (100 mg total) by mouth 2 (two) times daily.  Dispense: 20 tablet; Refill: 0  9. Chronic low back pain without sciatica, unspecified back pain laterality Patient has persistent back pain.  Chronic.  Pain is not controlled unless on medication.  Told patient that I would be able to continue gabapentin 100 mg twice a day and they would be difficult to continue this at the current dosing of orthopedics of 300 mg 3 times  a day. - gabapentin (NEURONTIN) 100 MG capsule; Take 1 capsule (100 mg total) by mouth 2 (two) times daily.  Dispense: 60 capsule; Refill: 0  10. Abscess New onset acute.  Persistent and worsening.  As noted above patient sustained a puncture-like wound from a [employer] shopping cart and now has a central area of soft fluctuant area with exquisite tenderness.  As noted above there is a surrounding cellulitis.  This may either be secondarily infected hematoma in the event that patient is on anticoagulation therapy but it is likely that this could be an abscess.  We have contacted the preference of the patient's husband's orthopedic individual and will refer to [facility] for evaluation and treatment of possible abscess/secondary infected hematoma. - doxycycline (VIBRA-TABS) 100 MG tablet; Take 1 tablet (100 mg total) by mouth 2 (two) times daily.  Dispense: 20 tablet; Refill: 0  I spent 45 minutes with this patient, More than 50% of that time was spent in face to face education, counseling and care coordination.

## 2019-09-08 DIAGNOSIS — L02419 Cutaneous abscess of limb, unspecified: Secondary | ICD-10-CM | POA: Diagnosis not present

## 2019-09-08 LAB — RENAL FUNCTION PANEL
Albumin: 4.4 g/dL (ref 3.7–4.7)
BUN/Creatinine Ratio: 20 (ref 12–28)
BUN: 16 mg/dL (ref 8–27)
CO2: 24 mmol/L (ref 20–29)
Calcium: 9.6 mg/dL (ref 8.7–10.3)
Chloride: 104 mmol/L (ref 96–106)
Creatinine, Ser: 0.79 mg/dL (ref 0.57–1.00)
GFR calc Af Amer: 87 mL/min/{1.73_m2} (ref >59)
GFR calc non Af Amer: 76 mL/min/{1.73_m2} (ref >59)
Glucose: 86 mg/dL (ref 65–99)
Phosphorus: 2.9 mg/dL — ABNORMAL LOW (ref 3.0–4.3)
Potassium: 4.9 mmol/L (ref 3.5–5.2)
Sodium: 140 mmol/L (ref 134–144)

## 2019-09-08 LAB — TSH: TSH: 2.94 u[IU]/mL (ref 0.450–4.500)

## 2019-09-08 LAB — LIPID PANEL WITH LDL/HDL RATIO
Cholesterol, Total: 197 mg/dL (ref 100–199)
HDL: 60 mg/dL (ref >39)
LDL Chol Calc (NIH): 116 mg/dL — ABNORMAL HIGH (ref 0–99)
LDL/HDL Ratio: 1.9 ratio (ref 0.0–3.2)
Triglycerides: 119 mg/dL (ref 0–149)
VLDL Cholesterol Cal: 21 mg/dL (ref 5–40)

## 2019-09-12 DIAGNOSIS — M720 Palmar fascial fibromatosis [Dupuytren]: Secondary | ICD-10-CM | POA: Insufficient documentation

## 2019-09-12 DIAGNOSIS — L02419 Cutaneous abscess of limb, unspecified: Secondary | ICD-10-CM | POA: Diagnosis not present

## 2019-09-28 DIAGNOSIS — I498 Other specified cardiac arrhythmias: Secondary | ICD-10-CM | POA: Diagnosis not present

## 2019-09-28 DIAGNOSIS — Z45010 Encounter for checking and testing of cardiac pacemaker pulse generator [battery]: Secondary | ICD-10-CM | POA: Diagnosis not present

## 2019-09-28 DIAGNOSIS — Z7901 Long term (current) use of anticoagulants: Secondary | ICD-10-CM | POA: Diagnosis not present

## 2019-09-28 DIAGNOSIS — Z950 Presence of cardiac pacemaker: Secondary | ICD-10-CM | POA: Diagnosis not present

## 2019-09-28 DIAGNOSIS — I519 Heart disease, unspecified: Secondary | ICD-10-CM | POA: Diagnosis not present

## 2019-09-28 DIAGNOSIS — I480 Paroxysmal atrial fibrillation: Secondary | ICD-10-CM | POA: Diagnosis not present

## 2019-09-28 DIAGNOSIS — I442 Atrioventricular block, complete: Secondary | ICD-10-CM | POA: Diagnosis not present

## 2019-09-28 DIAGNOSIS — Z45018 Encounter for adjustment and management of other part of cardiac pacemaker: Secondary | ICD-10-CM | POA: Diagnosis not present

## 2019-10-08 ENCOUNTER — Other Ambulatory Visit: Payer: Self-pay | Admitting: Family Medicine

## 2019-10-08 DIAGNOSIS — G8929 Other chronic pain: Secondary | ICD-10-CM

## 2019-10-08 DIAGNOSIS — M545 Low back pain, unspecified: Secondary | ICD-10-CM

## 2019-10-14 ENCOUNTER — Ambulatory Visit: Payer: Medicare Other

## 2019-10-14 ENCOUNTER — Ambulatory Visit: Payer: Medicare Other | Attending: Internal Medicine

## 2019-10-14 DIAGNOSIS — Z23 Encounter for immunization: Secondary | ICD-10-CM

## 2019-10-14 NOTE — Progress Notes (Signed)
   Covid-19 Vaccination Clinic  Name:  Danielle Taylor    MRN: [id number] DOB: [birth year]  10/14/2019  [name] was observed post Covid-19 immunization for 15 minutes without incidence. She was provided with Vaccine Information Sheet and instruction to access the V-Safe system.   [name] was instructed to call 911 with any severe reactions post vaccine: Marland Kitchen Difficulty breathing  . Swelling of your face and throat  . A fast heartbeat  . A bad rash all over your body  . Dizziness and weakness    Immunizations Administered    Name Date Dose VIS Date Route   Pfizer COVID-19 Vaccine 10/14/2019 12:37 PM 0.3 mL 08/04/2019 Intramuscular   Manufacturer: [place]   Lot: [id number]   [place]: [id number]

## 2019-10-23 DIAGNOSIS — I498 Other specified cardiac arrhythmias: Secondary | ICD-10-CM | POA: Diagnosis not present

## 2019-10-23 DIAGNOSIS — E890 Postprocedural hypothyroidism: Secondary | ICD-10-CM | POA: Diagnosis not present

## 2019-10-23 DIAGNOSIS — Z950 Presence of cardiac pacemaker: Secondary | ICD-10-CM | POA: Diagnosis not present

## 2019-10-23 DIAGNOSIS — J449 Chronic obstructive pulmonary disease, unspecified: Secondary | ICD-10-CM | POA: Diagnosis not present

## 2019-10-23 DIAGNOSIS — R05 Cough: Secondary | ICD-10-CM | POA: Diagnosis not present

## 2019-10-23 DIAGNOSIS — I10 Essential (primary) hypertension: Secondary | ICD-10-CM | POA: Diagnosis not present

## 2019-10-23 DIAGNOSIS — I519 Heart disease, unspecified: Secondary | ICD-10-CM | POA: Diagnosis not present

## 2019-10-23 DIAGNOSIS — I442 Atrioventricular block, complete: Secondary | ICD-10-CM | POA: Diagnosis not present

## 2019-10-23 DIAGNOSIS — E7800 Pure hypercholesterolemia, unspecified: Secondary | ICD-10-CM | POA: Diagnosis not present

## 2019-10-23 DIAGNOSIS — I2510 Atherosclerotic heart disease of native coronary artery without angina pectoris: Secondary | ICD-10-CM | POA: Diagnosis not present

## 2019-10-23 DIAGNOSIS — M48061 Spinal stenosis, lumbar region without neurogenic claudication: Secondary | ICD-10-CM | POA: Diagnosis not present

## 2019-10-23 DIAGNOSIS — Z880 Allergy status to penicillin: Secondary | ICD-10-CM | POA: Diagnosis not present

## 2019-10-23 DIAGNOSIS — Z01810 Encounter for preprocedural cardiovascular examination: Secondary | ICD-10-CM | POA: Diagnosis not present

## 2019-10-23 DIAGNOSIS — E669 Obesity, unspecified: Secondary | ICD-10-CM | POA: Diagnosis not present

## 2019-10-23 DIAGNOSIS — Z7901 Long term (current) use of anticoagulants: Secondary | ICD-10-CM | POA: Diagnosis not present

## 2019-10-23 DIAGNOSIS — I480 Paroxysmal atrial fibrillation: Secondary | ICD-10-CM | POA: Diagnosis not present

## 2019-10-23 DIAGNOSIS — Z20822 Contact with and (suspected) exposure to covid-19: Secondary | ICD-10-CM | POA: Diagnosis not present

## 2019-10-23 DIAGNOSIS — I119 Hypertensive heart disease without heart failure: Secondary | ICD-10-CM | POA: Diagnosis not present

## 2019-10-23 DIAGNOSIS — F339 Major depressive disorder, recurrent, unspecified: Secondary | ICD-10-CM | POA: Diagnosis not present

## 2019-10-23 DIAGNOSIS — J432 Centrilobular emphysema: Secondary | ICD-10-CM | POA: Diagnosis not present

## 2019-10-23 DIAGNOSIS — K2950 Unspecified chronic gastritis without bleeding: Secondary | ICD-10-CM | POA: Diagnosis not present

## 2019-10-23 DIAGNOSIS — J849 Interstitial pulmonary disease, unspecified: Secondary | ICD-10-CM | POA: Diagnosis not present

## 2019-10-23 DIAGNOSIS — Z9189 Other specified personal risk factors, not elsewhere classified: Secondary | ICD-10-CM | POA: Diagnosis not present

## 2019-10-24 DIAGNOSIS — I110 Hypertensive heart disease with heart failure: Secondary | ICD-10-CM | POA: Diagnosis not present

## 2019-10-24 DIAGNOSIS — Z950 Presence of cardiac pacemaker: Secondary | ICD-10-CM | POA: Diagnosis not present

## 2019-10-24 DIAGNOSIS — R9431 Abnormal electrocardiogram [ECG] [EKG]: Secondary | ICD-10-CM | POA: Diagnosis not present

## 2019-10-24 DIAGNOSIS — I442 Atrioventricular block, complete: Secondary | ICD-10-CM | POA: Diagnosis not present

## 2019-10-24 DIAGNOSIS — Z45018 Encounter for adjustment and management of other part of cardiac pacemaker: Secondary | ICD-10-CM | POA: Diagnosis not present

## 2019-10-24 DIAGNOSIS — Z7901 Long term (current) use of anticoagulants: Secondary | ICD-10-CM | POA: Diagnosis not present

## 2019-10-24 DIAGNOSIS — I5022 Chronic systolic (congestive) heart failure: Secondary | ICD-10-CM | POA: Diagnosis not present

## 2019-10-24 DIAGNOSIS — T82111A Breakdown (mechanical) of cardiac pulse generator (battery), initial encounter: Secondary | ICD-10-CM | POA: Diagnosis not present

## 2019-10-24 DIAGNOSIS — Z45010 Encounter for checking and testing of cardiac pacemaker pulse generator [battery]: Secondary | ICD-10-CM | POA: Diagnosis not present

## 2019-10-24 DIAGNOSIS — Z87891 Personal history of nicotine dependence: Secondary | ICD-10-CM | POA: Diagnosis not present

## 2019-10-24 DIAGNOSIS — I480 Paroxysmal atrial fibrillation: Secondary | ICD-10-CM | POA: Diagnosis not present

## 2019-10-25 DIAGNOSIS — J9811 Atelectasis: Secondary | ICD-10-CM | POA: Diagnosis not present

## 2019-10-25 DIAGNOSIS — R918 Other nonspecific abnormal finding of lung field: Secondary | ICD-10-CM | POA: Diagnosis not present

## 2019-10-25 DIAGNOSIS — Z7901 Long term (current) use of anticoagulants: Secondary | ICD-10-CM | POA: Diagnosis not present

## 2019-10-25 DIAGNOSIS — I480 Paroxysmal atrial fibrillation: Secondary | ICD-10-CM | POA: Diagnosis not present

## 2019-10-25 DIAGNOSIS — Z87891 Personal history of nicotine dependence: Secondary | ICD-10-CM | POA: Diagnosis not present

## 2019-10-25 DIAGNOSIS — Z950 Presence of cardiac pacemaker: Secondary | ICD-10-CM | POA: Diagnosis not present

## 2019-10-25 DIAGNOSIS — R9431 Abnormal electrocardiogram [ECG] [EKG]: Secondary | ICD-10-CM | POA: Diagnosis not present

## 2019-10-25 DIAGNOSIS — I442 Atrioventricular block, complete: Secondary | ICD-10-CM | POA: Diagnosis not present

## 2019-10-25 DIAGNOSIS — Z45010 Encounter for checking and testing of cardiac pacemaker pulse generator [battery]: Secondary | ICD-10-CM | POA: Diagnosis not present

## 2019-11-07 ENCOUNTER — Ambulatory Visit: Payer: Medicare Other | Attending: Internal Medicine

## 2019-11-07 DIAGNOSIS — Z23 Encounter for immunization: Secondary | ICD-10-CM

## 2019-11-07 NOTE — Progress Notes (Signed)
   Covid-19 Vaccination Clinic  Name:  Aarvi Stables    MRN: [id number] DOB: 10-Oct-1945  11/07/2019  Ms. Jarnagin was observed post Covid-19 immunization for 15 minutes without incident. She was provided with Vaccine Information Sheet and instruction to access the V-Safe system.   Ms. Budish was instructed to call 911 with any severe reactions post vaccine: Marland Kitchen Difficulty breathing  . Swelling of face and throat  . A fast heartbeat  . A bad rash all over body  . Dizziness and weakness   Immunizations Administered    Name Date Dose VIS Date Route   Pfizer COVID-19 Vaccine 11/07/2019  9:21 AM 0.3 mL 08/04/2019 Intramuscular   Manufacturer: [place]   Lot: [id number]   [place]: [id number]

## 2019-11-22 ENCOUNTER — Telehealth: Payer: Self-pay | Admitting: Urology

## 2019-11-22 NOTE — Telephone Encounter (Signed)
Left vm to offer 1 year f/u apt with Dr. Junious Silk for January 2022

## 2020-01-29 ENCOUNTER — Ambulatory Visit: Payer: Medicare Other

## 2020-02-01 DIAGNOSIS — I480 Paroxysmal atrial fibrillation: Secondary | ICD-10-CM | POA: Diagnosis not present

## 2020-02-01 DIAGNOSIS — Z950 Presence of cardiac pacemaker: Secondary | ICD-10-CM | POA: Diagnosis not present

## 2020-02-01 DIAGNOSIS — Z7901 Long term (current) use of anticoagulants: Secondary | ICD-10-CM | POA: Diagnosis not present

## 2020-02-01 DIAGNOSIS — I498 Other specified cardiac arrhythmias: Secondary | ICD-10-CM | POA: Diagnosis not present

## 2020-02-01 DIAGNOSIS — I442 Atrioventricular block, complete: Secondary | ICD-10-CM | POA: Diagnosis not present

## 2020-02-01 DIAGNOSIS — Z45018 Encounter for adjustment and management of other part of cardiac pacemaker: Secondary | ICD-10-CM | POA: Diagnosis not present

## 2020-02-12 ENCOUNTER — Other Ambulatory Visit: Payer: Self-pay | Admitting: Family Medicine

## 2020-02-12 DIAGNOSIS — B379 Candidiasis, unspecified: Secondary | ICD-10-CM

## 2020-02-12 NOTE — Telephone Encounter (Signed)
Requested medication (s) are due for refill today: Yes  Requested medication (s) are on the active medication list: Yes  Last refill:  03/16/19  Future visit scheduled: No  Notes to clinic:  Medication off protocol.    Requested Prescriptions  Pending Prescriptions Disp Refills   nystatin cream (MYCOSTATIN) [Pharmacy Med Name: NYSTATIN 100,000 UNIT/GM CREAM] 30 g 0    Sig: APPLY TO AFFECTED AREA TWICE A DAY      Off-Protocol Failed - 02/12/2020  1:01 PM      Failed - Medication not assigned to a protocol, review manually.      Passed - Valid encounter within last 12 months    Recent Outpatient Visits           5 months ago Essential (primary) hypertension   [facility] Juline Patch, MD   11 months ago [facility] Juline Patch, MD   12 months ago Hypothyroidism, unspecified type   [facility] Juline Patch, MD   1 year ago Acute maxillary sinusitis, recurrence not specified   [facility] Juline Patch, MD   1 year ago Preoperative clearance   [facility] Juline Patch, MD

## 2020-02-27 ENCOUNTER — Other Ambulatory Visit: Payer: Self-pay | Admitting: Family Medicine

## 2020-02-27 DIAGNOSIS — I10 Essential (primary) hypertension: Secondary | ICD-10-CM

## 2020-03-12 ENCOUNTER — Other Ambulatory Visit: Payer: Self-pay | Admitting: Family Medicine

## 2020-03-12 DIAGNOSIS — E039 Hypothyroidism, unspecified: Secondary | ICD-10-CM

## 2020-03-12 NOTE — Telephone Encounter (Signed)
Requested Prescriptions  Pending Prescriptions Disp Refills  . levothyroxine (SYNTHROID) 112 MCG tablet [Pharmacy Med Name: LEVOTHYROXINE 112 MCG TABLET] 90 tablet 1    Sig: TAKE 1 TABLET BY MOUTH EVERY DAY     Endocrinology:  Hypothyroid Agents Failed - 03/12/2020  2:02 AM      Failed - TSH needs to be rechecked within 3 months after an abnormal result. Refill until TSH is due.      Passed - TSH in normal range and within 360 days    TSH  Date Value Ref Range Status  09/07/2019 2.940 0.450 - 4.500 uIU/mL Final         Passed - Valid encounter within last 12 months    Recent Outpatient Visits          6 months ago Essential (primary) hypertension   [facility] Juline Patch, MD   12 months ago Cystitis   [facility] Juline Patch, MD   1 year ago Hypothyroidism, unspecified type   [facility] Juline Patch, MD   1 year ago Acute maxillary sinusitis, recurrence not specified   [facility] Juline Patch, MD   1 year ago Preoperative clearance   [facility] Juline Patch, MD

## 2020-03-13 ENCOUNTER — Other Ambulatory Visit: Payer: Self-pay | Admitting: Family Medicine

## 2020-03-13 DIAGNOSIS — J301 Allergic rhinitis due to pollen: Secondary | ICD-10-CM

## 2020-03-13 NOTE — Telephone Encounter (Signed)
Requested Prescriptions  Pending Prescriptions Disp Refills   loratadine (CLARITIN) 10 MG tablet [Pharmacy Med Name: LORATADINE 10 MG TABLET] 90 tablet 1    Sig: TAKE 1 TABLET BY MOUTH EVERY DAY     Ear, Nose, and Throat:  Antihistamines Passed - 03/13/2020  1:24 AM      Passed - Valid encounter within last 12 months    Recent Outpatient Visits          6 months ago Essential (primary) hypertension   [facility] Juline Patch, MD   12 months ago Cystitis   [facility] Juline Patch, MD   1 year ago Hypothyroidism, unspecified type   [facility] Juline Patch, MD   1 year ago Acute maxillary sinusitis, recurrence not specified   [facility] Juline Patch, MD   1 year ago Preoperative clearance   [facility] Juline Patch, MD

## 2020-03-24 ENCOUNTER — Other Ambulatory Visit: Payer: Self-pay | Admitting: Family Medicine

## 2020-03-24 DIAGNOSIS — E782 Mixed hyperlipidemia: Secondary | ICD-10-CM

## 2020-03-24 NOTE — Telephone Encounter (Signed)
Requested Prescriptions  Pending Prescriptions Disp Refills  . simvastatin (ZOCOR) 20 MG tablet [Pharmacy Med Name: SIMVASTATIN 20 MG TABLET] 90 tablet 1    Sig: TAKE 1 TABLET BY MOUTH EVERY DAY     Cardiovascular:  Antilipid - Statins Failed - 03/24/2020  9:06 AM      Failed - LDL in normal range and within 360 days    LDL Chol Calc (NIH)  Date Value Ref Range Status  09/07/2019 116 (H) 0 - 99 mg/dL Final         Passed - Total Cholesterol in normal range and within 360 days    Cholesterol, Total  Date Value Ref Range Status  09/07/2019 197 100 - 199 mg/dL Final         Passed - HDL in normal range and within 360 days    HDL  Date Value Ref Range Status  09/07/2019 60 >39 mg/dL Final         Passed - Triglycerides in normal range and within 360 days    Triglycerides  Date Value Ref Range Status  09/07/2019 119 0 - 149 mg/dL Final         Passed - Patient is not pregnant      Passed - Valid encounter within last 12 months    Recent Outpatient Visits          6 months ago Essential (primary) hypertension   [facility] Juline Patch, MD   1 year ago Cystitis   [facility] Juline Patch, MD   1 year ago Hypothyroidism, unspecified type   [facility] Juline Patch, MD   1 year ago Acute maxillary sinusitis, recurrence not specified   [facility] Juline Patch, MD   1 year ago Preoperative clearance   [facility] Juline Patch, MD

## 2020-04-04 DIAGNOSIS — G8929 Other chronic pain: Secondary | ICD-10-CM | POA: Diagnosis not present

## 2020-04-04 DIAGNOSIS — M545 Low back pain: Secondary | ICD-10-CM | POA: Diagnosis not present

## 2020-04-04 DIAGNOSIS — M5416 Radiculopathy, lumbar region: Secondary | ICD-10-CM | POA: Diagnosis not present

## 2020-04-05 ENCOUNTER — Other Ambulatory Visit: Payer: Self-pay | Admitting: Nurse Practitioner

## 2020-04-05 DIAGNOSIS — M545 Low back pain, unspecified: Secondary | ICD-10-CM

## 2020-04-05 DIAGNOSIS — G8929 Other chronic pain: Secondary | ICD-10-CM

## 2020-04-05 DIAGNOSIS — M5416 Radiculopathy, lumbar region: Secondary | ICD-10-CM

## 2020-04-08 ENCOUNTER — Ambulatory Visit (INDEPENDENT_AMBULATORY_CARE_PROVIDER_SITE_OTHER): Payer: Medicare Other | Admitting: Family Medicine

## 2020-04-08 ENCOUNTER — Other Ambulatory Visit: Payer: Self-pay

## 2020-04-08 ENCOUNTER — Encounter: Payer: Self-pay | Admitting: Family Medicine

## 2020-04-08 ENCOUNTER — Other Ambulatory Visit (HOSPITAL_COMMUNITY): Payer: Self-pay | Admitting: Nurse Practitioner

## 2020-04-08 VITALS — BP 134/78 | HR 67 | Ht 64.0 in | Wt 183.0 lb

## 2020-04-08 DIAGNOSIS — E860 Dehydration: Secondary | ICD-10-CM | POA: Diagnosis not present

## 2020-04-08 DIAGNOSIS — Z8673 Personal history of transient ischemic attack (TIA), and cerebral infarction without residual deficits: Secondary | ICD-10-CM

## 2020-04-08 DIAGNOSIS — R410 Disorientation, unspecified: Secondary | ICD-10-CM | POA: Diagnosis not present

## 2020-04-08 DIAGNOSIS — Z7901 Long term (current) use of anticoagulants: Secondary | ICD-10-CM

## 2020-04-08 DIAGNOSIS — I480 Paroxysmal atrial fibrillation: Secondary | ICD-10-CM

## 2020-04-08 DIAGNOSIS — R531 Weakness: Secondary | ICD-10-CM

## 2020-04-08 DIAGNOSIS — R4701 Aphasia: Secondary | ICD-10-CM | POA: Diagnosis not present

## 2020-04-08 DIAGNOSIS — Z950 Presence of cardiac pacemaker: Secondary | ICD-10-CM | POA: Diagnosis not present

## 2020-04-08 DIAGNOSIS — G459 Transient cerebral ischemic attack, unspecified: Secondary | ICD-10-CM | POA: Diagnosis not present

## 2020-04-08 DIAGNOSIS — R29898 Other symptoms and signs involving the musculoskeletal system: Secondary | ICD-10-CM | POA: Diagnosis not present

## 2020-04-08 DIAGNOSIS — G8929 Other chronic pain: Secondary | ICD-10-CM

## 2020-04-08 DIAGNOSIS — R4182 Altered mental status, unspecified: Secondary | ICD-10-CM | POA: Diagnosis not present

## 2020-04-08 NOTE — Progress Notes (Addendum)
Date:  04/08/2020   Name:  Danielle Taylor   DOB:  11-23-1945   MRN:  [id number]   Chief Complaint: change in appetite (Started on a new medication- Baclofen 10 mg TID as needed. Dr Nelly Laurence office.  Change in the way she is talking and forming sentences. Started this medicine Thursday night. After one pill her husband could not wake her up out of the bed. She could not stand up out of the bed. )  Neurologic Problem The patient's primary symptoms include an altered mental status, clumsiness, a loss of balance and weakness. The patient's pertinent negatives include no focal sensory loss, focal weakness, memory loss, near-syncope, slurred speech, syncope or visual change. Primary symptoms comment: expressive aphasia. This is a new problem. The current episode started in the past 7 days (onset Friday). The neurological problem developed suddenly. The problem has been waxing and waning since onset. There was no focality noted. Associated symptoms include confusion. Pertinent negatives include no abdominal pain, back pain, chest pain, dizziness, fatigue, fever, headaches, light-headedness, nausea, palpitations, shortness of breath or vomiting.    Lab Results  Component Value Date   CREATININE 0.79 09/07/2019   BUN 16 09/07/2019   NA 140 09/07/2019   K 4.9 09/07/2019   CL 104 09/07/2019   CO2 24 09/07/2019   Lab Results  Component Value Date   CHOL 197 09/07/2019   HDL 60 09/07/2019   LDLCALC 116 (H) 09/07/2019   TRIG 119 09/07/2019   CHOLHDL 3.5 02/08/2017   Lab Results  Component Value Date   TSH 2.940 09/07/2019   Lab Results  Component Value Date   HGBA1C 6.3 (H) 03/11/2016   Lab Results  Component Value Date   WBC 7.1 06/23/2018   HGB 14.1 06/23/2018   HCT 42.2 06/23/2018   MCV 100.0 06/23/2018   PLT 293 06/23/2018   Lab Results  Component Value Date   ALT 13 02/15/2019   AST 16 02/15/2019   ALKPHOS 126 (H) 02/15/2019   BILITOT 1.0 02/15/2019      Review of Systems  Constitutional: Negative.  Negative for chills, fatigue, fever and unexpected weight change.  HENT: Negative for congestion, ear discharge, ear pain, rhinorrhea, sinus pressure, sneezing and sore throat.   Eyes: Negative for photophobia, pain, discharge, redness and itching.  Respiratory: Negative for cough, shortness of breath, wheezing and stridor.   Cardiovascular: Negative for chest pain, palpitations, leg swelling and near-syncope.  Gastrointestinal: Negative for abdominal pain, blood in stool, constipation, diarrhea, nausea and vomiting.  Endocrine: Negative for cold intolerance, heat intolerance, polydipsia, polyphagia and polyuria.  Genitourinary: Negative for dysuria, flank pain, frequency, hematuria, menstrual problem, pelvic pain, urgency, vaginal bleeding and vaginal discharge.  Musculoskeletal: Negative for arthralgias, back pain and myalgias.  Skin: Negative for rash.  Allergic/Immunologic: Negative for environmental allergies and food allergies.  Neurological: Positive for weakness and loss of balance. Negative for dizziness, focal weakness, syncope, light-headedness, numbness and headaches.  Hematological: Negative for adenopathy. Does not bruise/bleed easily.  Psychiatric/Behavioral: Positive for confusion. Negative for dysphoric mood and memory loss. The patient is not nervous/anxious.     Patient Active Problem List   Diagnosis Date Noted  . Anticoagulation adequate with anticoagulant therapy 10/28/2018  . Complete heart block, transient ([place]) 10/28/2018  . COPD (chronic obstructive pulmonary disease) ([place]) 10/28/2018  . Penicillin allergy 10/28/2018  . 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 Lumbar radiculopathy 06/29/2018  . Chronic low back pain with sciatica 02/15/2018  . Reactive airway  disease, mild intermittent, uncomplicated 40/98/1191  . Chronic seasonal allergic rhinitis due to pollen 08/10/2017  . Mixed hyperlipidemia 08/10/2017   . Depression 08/10/2017  . Class 1 obesity due to excess calories without serious comorbidity with body mass index (BMI) of 34.0 to 34.9 in adult 08/10/2017  . Chronic cough 06/15/2017  . Interstitial lung disease ([place]) 06/15/2017  . Centrilobular emphysema ([place]) 04/02/2017  . Lumbar disc disease 04/02/2017  . Hepatic steatosis 04/02/2017  . Atherosclerosis of coronary artery of native heart 04/02/2017  . Cardiac pacemaker in situ 01/06/2017  . Paroxysmal A-fib ([place]) 03/30/2016  . Syncope and collapse 03/11/2016  . Right leg weakness 03/11/2016  . Essential hypertension 03/11/2016  . Hyperglycemia 03/11/2016  . 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 Reactive airway disease 03/04/2015  . Hypothyroid 12/07/2014  . Familial multiple lipoprotein-type hyperlipidemia 12/07/2014  . Acute bronchitis 12/07/2014  . Recurrent major depressive episodes ([place]) 12/07/2014  . Essential (primary) hypertension 12/07/2014  . H/O endocrine disorder 12/07/2014  . Pre-operative examination 12/07/2014  . History of depression 09/22/2012  . Fitting or adjustment of cardiac pacemaker 12/02/2011  . Chronic gastritis 07/24/1998    Allergies  Allergen Reactions  . Penicillins Other (See Comments)    Has patient had a PCN reaction causing immediate rash, facial/tongue/throat swelling, SOB or lightheadedness with hypotension: No Has patient had a PCN reaction causing severe rash involving mucus membranes or skin necrosis: No Has patient had a PCN reaction that required hospitalization No Has patient had a PCN reaction occurring within the last 10 years: No If all of the above answers are "NO", then may proceed with Cephalosporin use.     Past Surgical History:  Procedure Laterality Date  . APPENDECTOMY    . COLONOSCOPY  2000  . COLONOSCOPY WITH PROPOFOL N/A 04/26/2015   Procedure: COLONOSCOPY WITH PROPOFOL;  Surgeon: Hulen Luster, MD;  Location: [facility]  ENDOSCOPY;  Service: Gastroenterology;  Laterality: N/A;  . fibroid tumors     fingers and wrist  . FOOT SURGERY    . TONSILLECTOMY    . TRANSFORAMINAL LUMBAR INTERBODY FUSION (TLIF) WITH PEDICLE SCREW FIXATION 1 LEVEL N/A 06/29/2018   Procedure: TRANSFORAMINAL LUMBAR INTERBODY FUSION (TLIF) WITH PEDICLE SCREW FIXATION 1 LEVEL;  Surgeon: [name], MD;  Location: [facility] ORS;  Service: Neurosurgery;  Laterality: N/A;  . VAGINAL HYSTERECTOMY      Social History   Tobacco Use  . Smoking status: Former Smoker    Packs/day: 0.50    Years: 33.00    Pack years: 16.50    Types: Cigarettes    Quit date: 2003    Years since quitting: 18.6  . Smokeless tobacco: Never Used  . Tobacco comment: smoking cessation materials not required  Vaping Use  . Vaping Use: Never used  Substance Use Topics  . Alcohol use: Not Currently    Alcohol/week: 0.0 standard drinks    Comment: occassional  . Drug use: No     Medication list has been reviewed and updated.  Current Meds  Medication Sig  . acetaminophen (TYLENOL) 500 MG tablet Take 1,000 mg by mouth every 8 (eight) hours as needed (pain).  . baclofen (LIORESAL) 10 MG tablet Take 1 tablet by mouth 3 (three) times Taylor as needed for muscle spasms.  . citalopram (CELEXA) 40 MG tablet Take 1 tablet (40 mg total) by mouth Taylor.  Marland Kitchen gabapentin (NEURONTIN) 300 MG capsule Take 600 mg by mouth 3 (three) times Taylor.  Marland Kitchen  gemfibrozil (LOPID) 600 MG tablet Take 1 tablet (600 mg total) by mouth Taylor.  Marland Kitchen levothyroxine (SYNTHROID) 112 MCG tablet TAKE 1 TABLET BY MOUTH EVERY DAY  . loratadine (CLARITIN) 10 MG tablet TAKE 1 TABLET BY MOUTH EVERY DAY  . losartan (COZAAR) 25 MG tablet TAKE 2 TABLETS BY MOUTH EVERY DAY  . metoprolol succinate (TOPROL-XL) 50 MG 24 hr tablet TAKE 1 TABLET BY MOUTH EVERY DAY  . montelukast (SINGULAIR) 10 MG tablet TAKE 1 TABLET BY MOUTH EVERYDAY AT BEDTIME  . Multiple Vitamins-Minerals (CENTRUM SILVER 50+WOMEN PO) Take 1  tablet by mouth Taylor.  Marland Kitchen nystatin cream (MYCOSTATIN) APPLY TO AFFECTED AREA TWICE A DAY  . oxybutynin (DITROPAN-XL) 10 MG 24 hr tablet Take 1 tablet (10 mg total) by mouth at bedtime.  . rivaroxaban (XARELTO) 20 MG TABS tablet Take 20 mg by mouth Taylor with supper.  . simvastatin (ZOCOR) 20 MG tablet TAKE 1 TABLET BY MOUTH EVERY DAY   Current Facility-Administered Medications for the 04/08/20 encounter (Office Visit) with Juline Patch, MD  Medication  . albuterol (PROVENTIL) (2.5 MG/3ML) 0.083% nebulizer solution 2.5 mg  . ipratropium-albuterol (DUONEB) 0.5-2.5 (3) MG/3ML nebulizer solution 3 mL    PHQ 2/9 Scores 04/08/2020 09/07/2019 02/15/2019 01/25/2019  PHQ - 2 Score 0 0 0 0  PHQ- 9 Score 0 0 0 -    GAD 7 : Generalized Anxiety Score 04/08/2020 09/07/2019  Nervous, Anxious, on Edge 0 0  Control/stop worrying 0 0  Worry too much - different things 0 0  Trouble relaxing 0 0  Restless 0 0  Easily annoyed or irritable 0 0  Afraid - awful might happen 0 0  Total GAD 7 Score 0 0  Anxiety Difficulty Not difficult at all -    BP Readings from Last 3 Encounters:  04/08/20 134/78  09/07/19 120/60  09/01/19 (!) 154/83    Physical Exam Vitals and nursing note reviewed.  Constitutional:      General: She is not in acute distress.    Appearance: She is not diaphoretic.  HENT:     Head: Normocephalic and atraumatic.     Right Ear: Hearing, tympanic membrane, ear canal and external ear normal.     Left Ear: Hearing, tympanic membrane, ear canal and external ear normal.     Nose: Nose normal.     Mouth/Throat:     Lips: Pink.     Mouth: Mucous membranes are moist.  Eyes:     General: Vision grossly intact. Gaze aligned appropriately.        Right eye: No discharge.        Left eye: No discharge.     Extraocular Movements: Extraocular movements intact.     Conjunctiva/sclera: Conjunctivae normal.     Pupils: Pupils are equal, round, and reactive to light.  Neck:     Thyroid: No  thyromegaly.     Vascular: No JVD.  Cardiovascular:     Rate and Rhythm: Normal rate and regular rhythm.     Pulses: Normal pulses.     Heart sounds: Normal heart sounds, S1 normal and S2 normal. No murmur heard.  No systolic murmur is present.  No diastolic murmur is present.  No friction rub. No gallop. No S3 or S4 sounds.   Pulmonary:     Effort: Pulmonary effort is normal.     Breath sounds: Normal breath sounds. No decreased breath sounds, wheezing, rhonchi or rales.  Abdominal:     General: Bowel sounds  are normal.     Palpations: Abdomen is soft. There is no hepatomegaly, splenomegaly or mass.     Tenderness: There is no abdominal tenderness. There is no guarding.  Musculoskeletal:        General: Normal range of motion.     Cervical back: Normal range of motion and neck supple.     Right lower leg: No edema.     Left lower leg: No edema.  Lymphadenopathy:     Head:     Right side of head: No submandibular adenopathy.     Left side of head: No submandibular adenopathy.     Cervical: No cervical adenopathy.  Skin:    General: Skin is warm and dry.  Neurological:     Mental Status: She is alert.     Cranial Nerves: Cranial nerves are intact. No cranial nerve deficit or facial asymmetry.     Sensory: Sensation is intact.     Deep Tendon Reflexes: Reflexes are normal and symmetric.     Reflex Scores:      Tricep reflexes are 2+ on the right side and 2+ on the left side.      Bicep reflexes are 2+ on the right side and 2+ on the left side.      Brachioradialis reflexes are 2+ on the right side and 2+ on the left side.      Patellar reflexes are 2+ on the right side and 2+ on the left side. Psychiatric:        Behavior: Behavior is cooperative.     Wt Readings from Last 3 Encounters:  04/08/20 183 lb (83 kg)  09/07/19 183 lb (83 kg)  09/01/19 172 lb (78 kg)    BP 134/78   Pulse 67   Ht 5\' 4"  (1.626 m)   Wt 183 lb (83 kg)   SpO2 97%   BMI 31.41 kg/m    Assessment and Plan: Patient developed disorientation and somnolence over a 24 hours.  After taking 1 baclofen and 1 prednisone on Friday. 1. Confusion and disorientation New onset.  Sudden.  Patient's husband was unable to get her to wake up the next morning and thought it was due to the medication so he went to an appointment and came back to more or less the same situation that she was difficult to arouse.  Patient was unable to stand or walk on her own at that time.  He allowed her to rest more and over the course of the day she became more aware but unable to ambulate on her own.  2. Expressive aphasia Patient has had difficulty expressing her words and symptoms times unable to complete a whole sentence more than 3 or 4 words at a time.  Patient has had repetitive asking of the same question.  Also patient is either not able to recognize people and pictures were cannot verbalize the correct answer or has an underlying confusion as to who she is seeing.  3. Generalized weakness Patient's been unable to eat or drink to an adequate amount has developed gradual weakness over the course of the last 3 days.  4. 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  Patient has not had any further episodes since then.  5. Dehydration As noted above her oral intake has been decreased and she has an exam consistent with dehydration.  6. Adequate anticoagulation on anticoagulant therapy Patient is  on Xarelto for atrial fibrillation.  Patient does not have any falls in which she struck her head but did have a fall that was witnessed by her husband but just sort ahead to hold her up.  It does not appear to have had any loss of consciousness.  7. Cardiac pacemaker in situ Patient has a pacemaker in place that had a battery change earlier this year in February.  8. Paroxysmal A-fib ([place]) And as noted patient has a history of  atrial fib on anticoagulation followed by [facility] cardiology.

## 2020-04-09 ENCOUNTER — Telehealth: Payer: Self-pay | Admitting: Family Medicine

## 2020-04-09 NOTE — Telephone Encounter (Signed)
Pt went to [facility] and around midnight this morning they left. Pt had ekg, bloodwork and ct scan per husband. Pt husband is calling and would like dr Ronnald Ramp to review the test and give him a call

## 2020-04-10 ENCOUNTER — Other Ambulatory Visit
Admission: RE | Admit: 2020-04-10 | Discharge: 2020-04-10 | Disposition: A | Discharge status: Home / Self Care | Payer: Medicare Other | Attending: Family Medicine | Admitting: Family Medicine

## 2020-04-10 ENCOUNTER — Ambulatory Visit (INDEPENDENT_AMBULATORY_CARE_PROVIDER_SITE_OTHER): Payer: Medicare Other | Admitting: Family Medicine

## 2020-04-10 ENCOUNTER — Encounter: Payer: Self-pay | Admitting: Family Medicine

## 2020-04-10 ENCOUNTER — Other Ambulatory Visit: Payer: Self-pay

## 2020-04-10 VITALS — BP 122/70 | HR 89 | Ht 64.0 in | Wt 183.0 lb

## 2020-04-10 DIAGNOSIS — E860 Dehydration: Secondary | ICD-10-CM | POA: Diagnosis not present

## 2020-04-10 DIAGNOSIS — Z09 Encounter for follow-up examination after completed treatment for conditions other than malignant neoplasm: Secondary | ICD-10-CM | POA: Diagnosis not present

## 2020-04-10 LAB — RENAL FUNCTION PANEL
Albumin: 4.1 g/dL (ref 3.5–5.0)
Anion gap: 10 (ref 5–15)
BUN: 20 mg/dL (ref 8–23)
CO2: 20 mmol/L — ABNORMAL LOW (ref 22–32)
Calcium: 8.8 mg/dL — ABNORMAL LOW (ref 8.9–10.3)
Chloride: 100 mmol/L (ref 98–111)
Creatinine, Ser: 1.13 mg/dL — ABNORMAL HIGH (ref 0.44–1.00)
GFR calc Af Amer: 57 mL/min — ABNORMAL LOW (ref >60)
GFR calc non Af Amer: 49 mL/min — ABNORMAL LOW (ref >60)
Glucose, Bld: 98 mg/dL (ref 70–99)
Phosphorus: 4.5 mg/dL (ref 2.5–4.6)
Potassium: 3.9 mmol/L (ref 3.5–5.1)
Sodium: 130 mmol/L — ABNORMAL LOW (ref 135–145)

## 2020-04-10 NOTE — Telephone Encounter (Signed)
Patient is scheduled this morning to see Dr Ronnald Ramp to go over labs.   CM

## 2020-04-10 NOTE — Progress Notes (Signed)
Date:  04/10/2020   Name:  Danielle Taylor   DOB:  23-Jun-1946   MRN:  [id number]   Chief Complaint: ER follow up (Patient and husband Jenny Reichmann want to discuss patients labs from her recent ER visit at [facility]. )  Patient is a 74 year old female who presents for a comprehensive physical exam. The patient reports the following problems: follow up from er. Health maintenance has been reviewed up to date.   Lab Results  Component Value Date   CREATININE 0.79 09/07/2019   BUN 16 09/07/2019   NA 140 09/07/2019   K 4.9 09/07/2019   CL 104 09/07/2019   CO2 24 09/07/2019   Lab Results  Component Value Date   CHOL 197 09/07/2019   HDL 60 09/07/2019   LDLCALC 116 (H) 09/07/2019   TRIG 119 09/07/2019   CHOLHDL 3.5 02/08/2017   Lab Results  Component Value Date   TSH 2.940 09/07/2019   Lab Results  Component Value Date   HGBA1C 6.3 (H) 03/11/2016   Lab Results  Component Value Date   WBC 7.1 06/23/2018   HGB 14.1 06/23/2018   HCT 42.2 06/23/2018   MCV 100.0 06/23/2018   PLT 293 06/23/2018   Lab Results  Component Value Date   ALT 13 02/15/2019   AST 16 02/15/2019   ALKPHOS 126 (H) 02/15/2019   BILITOT 1.0 02/15/2019     Review of Systems  Constitutional: Negative.  Negative for chills, fatigue, fever and unexpected weight change.  HENT: Negative for congestion, ear discharge, ear pain, rhinorrhea, sinus pressure, sneezing and sore throat.   Eyes: Negative for photophobia, pain, discharge, redness and itching.  Respiratory: Negative for cough, shortness of breath, wheezing and stridor.   Gastrointestinal: Negative for abdominal pain, blood in stool, constipation, diarrhea, nausea and vomiting.  Endocrine: Negative for cold intolerance, heat intolerance, polydipsia, polyphagia and polyuria.  Genitourinary: Negative for dysuria, flank pain, frequency, hematuria, menstrual problem, pelvic pain, urgency, vaginal bleeding and vaginal discharge.  Musculoskeletal: Negative  for arthralgias, back pain and myalgias.  Skin: Negative for rash.  Allergic/Immunologic: Negative for environmental allergies and food allergies.  Neurological: Negative for dizziness, weakness, light-headedness, numbness and headaches.  Hematological: Negative for adenopathy. Does not bruise/bleed easily.  Psychiatric/Behavioral: Negative for dysphoric mood. The patient is not nervous/anxious.     Patient Active Problem List   Diagnosis Date Noted  . Anticoagulation adequate with anticoagulant therapy 10/28/2018  . Complete heart block, transient ([place]) 10/28/2018  . COPD (chronic obstructive pulmonary disease) ([place]) 10/28/2018  . Penicillin allergy 10/28/2018  . 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 Lumbar radiculopathy 06/29/2018  . Chronic low back pain with sciatica 02/15/2018  . Reactive airway disease, mild intermittent, uncomplicated 97/09/6376  . Chronic seasonal allergic rhinitis due to pollen 08/10/2017  . Mixed hyperlipidemia 08/10/2017  . Depression 08/10/2017  . Class 1 obesity due to excess calories without serious comorbidity with body mass index (BMI) of 34.0 to 34.9 in adult 08/10/2017  . Chronic cough 06/15/2017  . Interstitial lung disease ([place]) 06/15/2017  . Centrilobular emphysema ([place]) 04/02/2017  . Lumbar disc disease 04/02/2017  . Hepatic steatosis 04/02/2017  . Atherosclerosis of coronary artery of native heart 04/02/2017  . Cardiac pacemaker in situ 01/06/2017  . Paroxysmal A-fib ([place]) 03/30/2016  . Syncope and collapse 03/11/2016  . Right leg weakness 03/11/2016  . Essential hypertension 03/11/2016  . Hyperglycemia 03/11/2016  . 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  Reactive airway disease 03/04/2015  . Hypothyroid 12/07/2014  . Familial multiple lipoprotein-type hyperlipidemia 12/07/2014  . Acute bronchitis 12/07/2014  . Recurrent major depressive episodes ([place]) 12/07/2014  . Essential  (primary) hypertension 12/07/2014  . H/O endocrine disorder 12/07/2014  . Pre-operative examination 12/07/2014  . History of depression 09/22/2012  . Fitting or adjustment of cardiac pacemaker 12/02/2011  . Chronic gastritis 07/24/1998    Allergies  Allergen Reactions  . Baclofen Other (See Comments)    Weakness, confusion, tremors.    . Penicillins Other (See Comments)    Has patient had a PCN reaction causing immediate rash, facial/tongue/throat swelling, SOB or lightheadedness with hypotension: No Has patient had a PCN reaction causing severe rash involving mucus membranes or skin necrosis: No Has patient had a PCN reaction that required hospitalization No Has patient had a PCN reaction occurring within the last 10 years: No If all of the above answers are "NO", then may proceed with Cephalosporin use.     Past Surgical History:  Procedure Laterality Date  . APPENDECTOMY    . COLONOSCOPY  2000  . COLONOSCOPY WITH PROPOFOL N/A 04/26/2015   Procedure: COLONOSCOPY WITH PROPOFOL;  Surgeon: Hulen Luster, MD;  Location: [facility] ENDOSCOPY;  Service: Gastroenterology;  Laterality: N/A;  . fibroid tumors     fingers and wrist  . FOOT SURGERY    . TONSILLECTOMY    . TRANSFORAMINAL LUMBAR INTERBODY FUSION (TLIF) WITH PEDICLE SCREW FIXATION 1 LEVEL N/A 06/29/2018   Procedure: TRANSFORAMINAL LUMBAR INTERBODY FUSION (TLIF) WITH PEDICLE SCREW FIXATION 1 LEVEL;  Surgeon: [name], MD;  Location: [facility] ORS;  Service: Neurosurgery;  Laterality: N/A;  . VAGINAL HYSTERECTOMY      Social History   Tobacco Use  . Smoking status: Former Smoker    Packs/day: 0.50    Years: 33.00    Pack years: 16.50    Types: Cigarettes    Quit date: 2003    Years since quitting: 18.6  . Smokeless tobacco: Never Used  . Tobacco comment: smoking cessation materials not required  Vaping Use  . Vaping Use: Never used  Substance Use Topics  . Alcohol use: Not Currently    Alcohol/week: 0.0 standard drinks     Comment: occassional  . Drug use: No     Medication list has been reviewed and updated.  Current Meds  Medication Sig  . acetaminophen (TYLENOL) 500 MG tablet Take 1,000 mg by mouth every 8 (eight) hours as needed (pain).  . citalopram (CELEXA) 40 MG tablet Take 1 tablet (40 mg total) by mouth daily.  Marland Kitchen gabapentin (NEURONTIN) 300 MG capsule Take 600 mg by mouth 3 (three) times daily.  Marland Kitchen gemfibrozil (LOPID) 600 MG tablet Take 1 tablet (600 mg total) by mouth daily.  Marland Kitchen levothyroxine (SYNTHROID) 112 MCG tablet TAKE 1 TABLET BY MOUTH EVERY DAY  . loratadine (CLARITIN) 10 MG tablet TAKE 1 TABLET BY MOUTH EVERY DAY  . losartan (COZAAR) 25 MG tablet TAKE 2 TABLETS BY MOUTH EVERY DAY  . metoprolol succinate (TOPROL-XL) 50 MG 24 hr tablet TAKE 1 TABLET BY MOUTH EVERY DAY  . montelukast (SINGULAIR) 10 MG tablet TAKE 1 TABLET BY MOUTH EVERYDAY AT BEDTIME  . Multiple Vitamins-Minerals (CENTRUM SILVER 50+WOMEN PO) Take 1 tablet by mouth daily.  Marland Kitchen nystatin cream (MYCOSTATIN) APPLY TO AFFECTED AREA TWICE A DAY  . oxybutynin (DITROPAN-XL) 10 MG 24 hr tablet Take 1 tablet (10 mg total) by mouth at bedtime.  . rivaroxaban (XARELTO) 20 MG TABS tablet  Take 20 mg by mouth daily with supper.  . simvastatin (ZOCOR) 20 MG tablet TAKE 1 TABLET BY MOUTH EVERY DAY   Current Facility-Administered Medications for the 04/10/20 encounter (Office Visit) with Juline Patch, MD  Medication  . albuterol (PROVENTIL) (2.5 MG/3ML) 0.083% nebulizer solution 2.5 mg  . ipratropium-albuterol (DUONEB) 0.5-2.5 (3) MG/3ML nebulizer solution 3 mL    PHQ 2/9 Scores 04/08/2020 09/07/2019 02/15/2019 01/25/2019  PHQ - 2 Score 0 0 0 0  PHQ- 9 Score 0 0 0 -    GAD 7 : Generalized Anxiety Score 04/08/2020 09/07/2019  Nervous, Anxious, on Edge 0 0  Control/stop worrying 0 0  Worry too much - different things 0 0  Trouble relaxing 0 0  Restless 0 0  Easily annoyed or irritable 0 0  Afraid - awful might happen 0 0  Total GAD 7  Score 0 0  Anxiety Difficulty Not difficult at all -    BP Readings from Last 3 Encounters:  04/10/20 122/70  04/08/20 134/78  09/07/19 120/60    Physical Exam Vitals and nursing note reviewed.  Constitutional:      Appearance: She is well-developed.  HENT:     Head: Normocephalic.     Right Ear: Tympanic membrane, ear canal and external ear normal. There is no impacted cerumen.     Left Ear: Tympanic membrane, ear canal and external ear normal. There is no impacted cerumen.  Eyes:     General: Lids are everted, no foreign bodies appreciated. No scleral icterus.       Left eye: No foreign body or hordeolum.     Conjunctiva/sclera: Conjunctivae normal.     Right eye: Right conjunctiva is not injected.     Left eye: Left conjunctiva is not injected.     Pupils: Pupils are equal, round, and reactive to light.  Neck:     Thyroid: No thyromegaly.     Vascular: No JVD.     Trachea: No tracheal deviation.  Cardiovascular:     Rate and Rhythm: Normal rate and regular rhythm.     Heart sounds: Normal heart sounds. No murmur heard.  No friction rub. No gallop.   Pulmonary:     Effort: Pulmonary effort is normal. No respiratory distress.     Breath sounds: Normal breath sounds. No wheezing or rales.  Abdominal:     General: Bowel sounds are normal.     Palpations: Abdomen is soft. There is no mass.     Tenderness: There is no abdominal tenderness. There is no guarding or rebound.  Musculoskeletal:        General: No tenderness. Normal range of motion.     Cervical back: Normal range of motion and neck supple.  Lymphadenopathy:     Cervical: No cervical adenopathy.  Skin:    General: Skin is warm.     Findings: No rash.  Neurological:     Mental Status: She is alert and oriented to person, place, and time.     Cranial Nerves: No cranial nerve deficit.     Deep Tendon Reflexes: Reflexes normal.  Psychiatric:        Mood and Affect: Mood is not anxious or depressed.     Wt  Readings from Last 3 Encounters:  04/10/20 183 lb (83 kg)  04/08/20 183 lb (83 kg)  09/07/19 183 lb (83 kg)    BP 122/70   Pulse 89   Ht 5\' 4"  (1.626 m)   Wt 183  lb (83 kg)   SpO2 94%   BMI 31.41 kg/m   Assessment and Plan: 1. Encounter for examination following treatment at hospital Patient presents following encounter at [facility] emergency room.  Patient was evaluated with CT without contrast and lab work for a strokelike presentation.  Patient is doing significantly better 48 hours however patient felt that we should recheck an this was noted.  Neurologic exam was normal today and patient has significantly improved as far as her mental status presentation as well as her physical exam.  2. Dehydration Patient was noted that she had elevated creatinine BUN and husband is concerned about her renal function.  Patient had decreased oral intake at the time it was taken and most likely reflected a prerenal situation.  We will check a renal function panel to ensure that electrolytes and she GFR creatinine BUN have responded to the hydration. - Renal Function Panel

## 2020-04-12 ENCOUNTER — Ambulatory Visit: Payer: Medicare Other

## 2020-04-13 ENCOUNTER — Other Ambulatory Visit: Payer: Self-pay | Admitting: Family Medicine

## 2020-04-13 DIAGNOSIS — I10 Essential (primary) hypertension: Secondary | ICD-10-CM

## 2020-04-13 NOTE — Telephone Encounter (Signed)
Requested Prescriptions  Pending Prescriptions Disp Refills  . metoprolol succinate (TOPROL-XL) 50 MG 24 hr tablet [Pharmacy Med Name: METOPROLOL SUCC ER 50 MG TAB] 90 tablet 1    Sig: TAKE 1 TABLET BY MOUTH EVERY DAY     Cardiovascular:  Beta Blockers Passed - 04/13/2020  9:16 AM      Passed - Last BP in normal range    BP Readings from Last 1 Encounters:  04/10/20 122/70         Passed - Last Heart Rate in normal range    Pulse Readings from Last 1 Encounters:  04/10/20 89         Passed - Valid encounter within last 6 months    Recent Outpatient Visits          3 days ago Encounter for examination following treatment at hospital   [facility] Juline Patch, MD   5 days ago Confusion and disorientation   [facility] Juline Patch, MD   7 months ago Essential (primary) hypertension   [facility] Juline Patch, MD   1 year ago Cystitis   [facility] Juline Patch, MD   1 year ago Hypothyroidism, unspecified type   [facility] Juline Patch, MD

## 2020-05-01 ENCOUNTER — Other Ambulatory Visit: Payer: Self-pay

## 2020-05-03 ENCOUNTER — Other Ambulatory Visit: Payer: Self-pay

## 2020-05-03 ENCOUNTER — Ambulatory Visit (INDEPENDENT_AMBULATORY_CARE_PROVIDER_SITE_OTHER): Payer: Medicare Other | Admitting: Family Medicine

## 2020-05-03 ENCOUNTER — Encounter: Payer: Self-pay | Admitting: Family Medicine

## 2020-05-03 VITALS — BP 120/80 | HR 80 | Temp 98.7°F | Ht 64.0 in | Wt 188.0 lb

## 2020-05-03 DIAGNOSIS — Z20818 Contact with and (suspected) exposure to other bacterial communicable diseases: Secondary | ICD-10-CM

## 2020-05-03 DIAGNOSIS — J020 Streptococcal pharyngitis: Secondary | ICD-10-CM | POA: Diagnosis not present

## 2020-05-03 LAB — POCT RAPID STREP A (OFFICE): Rapid Strep A Screen: POSITIVE — AB

## 2020-05-03 MED ORDER — AZITHROMYCIN 250 MG PO TABS: ORAL_TABLET | ORAL | 0 refills | Status: DC

## 2020-05-03 NOTE — Progress Notes (Signed)
Date:  05/03/2020   Name:  Danielle Taylor   DOB:  11-05-1945   MRN:  [id number]   Chief Complaint: Follow-up (had another "spell started Saturday and lasted 4 days") and Flu Vaccine  Patient is a 74 year old female who presents for a exposure to strep exam. The patient reports the following problems: as noted. Health maintenance has been reviewed upto date.   Lab Results  Component Value Date   CREATININE 1.13 (H) 04/10/2020   BUN 20 04/10/2020   NA 130 (L) 04/10/2020   K 3.9 04/10/2020   CL 100 04/10/2020   CO2 20 (L) 04/10/2020   Lab Results  Component Value Date   CHOL 197 09/07/2019   HDL 60 09/07/2019   LDLCALC 116 (H) 09/07/2019   TRIG 119 09/07/2019   CHOLHDL 3.5 02/08/2017   Lab Results  Component Value Date   TSH 2.940 09/07/2019   Lab Results  Component Value Date   HGBA1C 6.3 (H) 03/11/2016   Lab Results  Component Value Date   WBC 7.1 06/23/2018   HGB 14.1 06/23/2018   HCT 42.2 06/23/2018   MCV 100.0 06/23/2018   PLT 293 06/23/2018   Lab Results  Component Value Date   ALT 13 02/15/2019   AST 16 02/15/2019   ALKPHOS 126 (H) 02/15/2019   BILITOT 1.0 02/15/2019     Review of Systems  Constitutional: Negative.  Negative for chills, fatigue, fever and unexpected weight change.  HENT: Negative for congestion, ear discharge, ear pain, rhinorrhea, sinus pressure, sneezing and sore throat.   Eyes: Negative for photophobia, pain, discharge, redness and itching.  Respiratory: Negative for cough, shortness of breath, wheezing and stridor.   Gastrointestinal: Negative for abdominal pain, blood in stool, constipation, diarrhea, nausea and vomiting.  Endocrine: Negative for cold intolerance, heat intolerance, polydipsia, polyphagia and polyuria.  Genitourinary: Negative for dysuria, flank pain, frequency, hematuria, menstrual problem, pelvic pain, urgency, vaginal bleeding and vaginal discharge.  Musculoskeletal: Negative for arthralgias, back pain  and myalgias.  Skin: Negative for rash.  Allergic/Immunologic: Negative for environmental allergies and food allergies.  Neurological: Negative for dizziness, weakness, light-headedness, numbness and headaches.  Hematological: Negative for adenopathy. Does not bruise/bleed easily.  Psychiatric/Behavioral: Negative for dysphoric mood. The patient is not nervous/anxious.     Patient Active Problem List   Diagnosis Date Noted  . Anticoagulation adequate with anticoagulant therapy 10/28/2018  . Complete heart block, transient ([place]) 10/28/2018  . COPD (chronic obstructive pulmonary disease) ([place]) 10/28/2018  . Penicillin allergy 10/28/2018  . 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 Lumbar radiculopathy 06/29/2018  . Chronic low back pain with sciatica 02/15/2018  . Reactive airway disease, mild intermittent, uncomplicated 26/71/2458  . Chronic seasonal allergic rhinitis due to pollen 08/10/2017  . Mixed hyperlipidemia 08/10/2017  . Depression 08/10/2017  . Class 1 obesity due to excess calories without serious comorbidity with body mass index (BMI) of 34.0 to 34.9 in adult 08/10/2017  . Chronic cough 06/15/2017  . Interstitial lung disease ([place]) 06/15/2017  . Centrilobular emphysema ([place]) 04/02/2017  . Lumbar disc disease 04/02/2017  . Hepatic steatosis 04/02/2017  . Atherosclerosis of coronary artery of native heart 04/02/2017  . Cardiac pacemaker in situ 01/06/2017  . Paroxysmal A-fib ([place]) 03/30/2016  . Syncope and collapse 03/11/2016  . Right leg weakness 03/11/2016  . Essential hypertension 03/11/2016  . Hyperglycemia 03/11/2016  . 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 Reactive airway disease 03/04/2015  .  Hypothyroid 12/07/2014  . Familial multiple lipoprotein-type hyperlipidemia 12/07/2014  . Acute bronchitis 12/07/2014  . Recurrent major depressive episodes ([place]) 12/07/2014  . Essential (primary) hypertension  12/07/2014  . H/O endocrine disorder 12/07/2014  . Pre-operative examination 12/07/2014  . History of depression 09/22/2012  . Fitting or adjustment of cardiac pacemaker 12/02/2011  . Chronic gastritis 07/24/1998    Allergies  Allergen Reactions  . Baclofen Other (See Comments)    Weakness, confusion, tremors.    . Penicillins Other (See Comments)    Has patient had a PCN reaction causing immediate rash, facial/tongue/throat swelling, SOB or lightheadedness with hypotension: No Has patient had a PCN reaction causing severe rash involving mucus membranes or skin necrosis: No Has patient had a PCN reaction that required hospitalization No Has patient had a PCN reaction occurring within the last 10 years: No If all of the above answers are "NO", then may proceed with Cephalosporin use.     Past Surgical History:  Procedure Laterality Date  . APPENDECTOMY    . COLONOSCOPY  2000  . COLONOSCOPY WITH PROPOFOL N/A 04/26/2015   Procedure: COLONOSCOPY WITH PROPOFOL;  Surgeon: Hulen Luster, MD;  Location[facility] ENDOSCOPY;  Service: Gastroenterology;  Laterality: N/A;  . fibroid tumors     fingers and wrist  . FOOT SURGERY    . TONSILLECTOMY    . TRANSFORAMINAL LUMBAR INTERBODY FUSION (TLIF) WITH PEDICLE SCREW FIXATION 1 LEVEL N/A 06/29/2018   Procedure: TRANSFORAMINAL LUMBAR INTERBODY FUSION (TLIF) WITH PEDICLE SCREW FIXATION 1 LEVEL;  Surgeon: [name], MD;  Location: [facility] ORS;  Service: Neurosurgery;  Laterality: N/A;  . VAGINAL HYSTERECTOMY      Social History   Tobacco Use  . Smoking status: Former Smoker    Packs/day: 0.50    Years: 33.00    Pack years: 16.50    Types: Cigarettes    Quit date: 2003    Years since quitting: 18.7  . Smokeless tobacco: Never Used  . Tobacco comment: smoking cessation materials not required  Vaping Use  . Vaping Use: Never used  Substance Use Topics  . Alcohol use: Not Currently    Alcohol/week: 0.0 standard drinks    Comment:  occassional  . Drug use: No     Medication list has been reviewed and updated.  Current Meds  Medication Sig  . acetaminophen (TYLENOL) 500 MG tablet Take 1,000 mg by mouth every 8 (eight) hours as needed (pain).  . citalopram (CELEXA) 40 MG tablet Take 1 tablet (40 mg total) by mouth daily.  Marland Kitchen gabapentin (NEURONTIN) 300 MG capsule Take 600 mg by mouth 3 (three) times daily. [place]  . gemfibrozil (LOPID) 600 MG tablet Take 1 tablet (600 mg total) by mouth daily.  Marland Kitchen levothyroxine (SYNTHROID) 112 MCG tablet TAKE 1 TABLET BY MOUTH EVERY DAY  . loratadine (CLARITIN) 10 MG tablet TAKE 1 TABLET BY MOUTH EVERY DAY  . losartan (COZAAR) 25 MG tablet TAKE 2 TABLETS BY MOUTH EVERY DAY  . metoprolol succinate (TOPROL-XL) 50 MG 24 hr tablet TAKE 1 TABLET BY MOUTH EVERY DAY  . montelukast (SINGULAIR) 10 MG tablet TAKE 1 TABLET BY MOUTH EVERYDAY AT BEDTIME  . Multiple Vitamins-Minerals (CENTRUM SILVER 50+WOMEN PO) Take 1 tablet by mouth daily.  Marland Kitchen oxybutynin (DITROPAN-XL) 10 MG 24 hr tablet Take 1 tablet (10 mg total) by mouth at bedtime.  . rivaroxaban (XARELTO) 20 MG TABS tablet Take 20 mg by mouth daily with supper.  . simvastatin (ZOCOR) 20 MG tablet TAKE 1  TABLET BY MOUTH EVERY DAY   Current Facility-Administered Medications for the 05/03/20 encounter (Office Visit) with Juline Patch, MD  Medication  . albuterol (PROVENTIL) (2.5 MG/3ML) 0.083% nebulizer solution 2.5 mg  . ipratropium-albuterol (DUONEB) 0.5-2.5 (3) MG/3ML nebulizer solution 3 mL    PHQ 2/9 Scores 04/08/2020 09/07/2019 02/15/2019 01/25/2019  PHQ - 2 Score 0 0 0 0  PHQ- 9 Score 0 0 0 -    GAD 7 : Generalized Anxiety Score 04/08/2020 09/07/2019  Nervous, Anxious, on Edge 0 0  Control/stop worrying 0 0  Worry too much - different things 0 0  Trouble relaxing 0 0  Restless 0 0  Easily annoyed or irritable 0 0  Afraid - awful might happen 0 0  Total GAD 7 Score 0 0  Anxiety Difficulty Not difficult at all -    BP Readings  from Last 3 Encounters:  05/03/20 120/80  04/10/20 122/70  04/08/20 134/78    Physical Exam Vitals and nursing note reviewed.  Constitutional:      Appearance: She is well-developed.  HENT:     Head: Normocephalic.     Jaw: There is normal jaw occlusion.     Right Ear: Tympanic membrane, ear canal and external ear normal.     Left Ear: Tympanic membrane, ear canal and external ear normal.     Nose: Nose normal. No congestion or rhinorrhea.     Right Sinus: No maxillary sinus tenderness or frontal sinus tenderness.     Left Sinus: No maxillary sinus tenderness or frontal sinus tenderness.     Mouth/Throat:     Mouth: Mucous membranes are moist.     Pharynx: Oropharynx is clear. No pharyngeal swelling, oropharyngeal exudate, posterior oropharyngeal erythema or uvula swelling.  Eyes:     General: Lids are everted, no foreign bodies appreciated. No scleral icterus.       Left eye: No foreign body or hordeolum.     Conjunctiva/sclera: Conjunctivae normal.     Right eye: Right conjunctiva is not injected.     Left eye: Left conjunctiva is not injected.     Pupils: Pupils are equal, round, and reactive to light.  Neck:     Thyroid: No thyromegaly.     Vascular: No carotid bruit or JVD.     Trachea: No tracheal deviation.  Cardiovascular:     Rate and Rhythm: Normal rate and regular rhythm.     Heart sounds: Normal heart sounds. No murmur heard.  No friction rub. No gallop.   Pulmonary:     Effort: Pulmonary effort is normal. No respiratory distress.     Breath sounds: Normal breath sounds. No wheezing or rales.  Abdominal:     General: Bowel sounds are normal. There is no distension.     Palpations: Abdomen is soft. There is no mass.     Tenderness: There is no abdominal tenderness. There is no right CVA tenderness, left CVA tenderness, guarding or rebound.  Musculoskeletal:        General: No tenderness. Normal range of motion.     Cervical back: Normal range of motion and neck  supple. No rigidity or tenderness.  Lymphadenopathy:     Cervical: No cervical adenopathy.  Skin:    General: Skin is warm.     Findings: No rash.  Neurological:     Mental Status: She is alert and oriented to person, place, and time.     Cranial Nerves: No cranial nerve deficit.  Deep Tendon Reflexes: Reflexes normal.  Psychiatric:        Mood and Affect: Mood is not anxious or depressed.     Wt Readings from Last 3 Encounters:  05/03/20 188 lb (85.3 kg)  04/10/20 183 lb (83 kg)  04/08/20 183 lb (83 kg)    BP 120/80   Pulse 80   Temp 98.7 F (37.1 C) (Oral)   Ht 5\' 4"  (1.626 m)   Wt 188 lb (85.3 kg)   SpO2 93%   BMI 32.27 kg/m   Assessment and Plan:  1. Exposure to Streptococcal pharyngitis Patient had exposure to grandchild that was positive for strep pharyngitis last week.  Patient is asymptomatic at this time.  We will check a rapid strep test. - POCT rapid strep A  2. Strep pharyngitis Acute.  New exposure.  Asymptomatic.  POCT strep rapid test was positive for strep pharyngitis and we will treat with azithromycin to 50 mg 2 today followed by 1 a day for 4 days. - azithromycin (ZITHROMAX) 250 MG tablet; 2 today then 1 a day for 4 days  Dispense: 6 tablet; Refill: 0

## 2020-05-08 ENCOUNTER — Other Ambulatory Visit: Payer: Medicare Other

## 2020-05-08 DIAGNOSIS — R7989 Other specified abnormal findings of blood chemistry: Secondary | ICD-10-CM | POA: Diagnosis not present

## 2020-05-09 LAB — RENAL FUNCTION PANEL
Albumin: 4.4 g/dL (ref 3.7–4.7)
BUN/Creatinine Ratio: 29 — ABNORMAL HIGH (ref 12–28)
BUN: 23 mg/dL (ref 8–27)
CO2: 23 mmol/L (ref 20–29)
Calcium: 9.7 mg/dL (ref 8.7–10.3)
Chloride: 102 mmol/L (ref 96–106)
Creatinine, Ser: 0.79 mg/dL (ref 0.57–1.00)
GFR calc Af Amer: 87 mL/min/{1.73_m2} (ref >59)
GFR calc non Af Amer: 76 mL/min/{1.73_m2} (ref >59)
Glucose: 101 mg/dL — ABNORMAL HIGH (ref 65–99)
Phosphorus: 3.2 mg/dL (ref 3.0–4.3)
Potassium: 5.1 mmol/L (ref 3.5–5.2)
Sodium: 139 mmol/L (ref 134–144)

## 2020-05-16 ENCOUNTER — Other Ambulatory Visit: Payer: Self-pay | Admitting: Family Medicine

## 2020-05-16 DIAGNOSIS — J4520 Mild intermittent asthma, uncomplicated: Secondary | ICD-10-CM

## 2020-05-16 DIAGNOSIS — J301 Allergic rhinitis due to pollen: Secondary | ICD-10-CM

## 2020-05-18 ENCOUNTER — Other Ambulatory Visit: Payer: Self-pay | Admitting: Family Medicine

## 2020-05-18 DIAGNOSIS — E782 Mixed hyperlipidemia: Secondary | ICD-10-CM

## 2020-05-18 DIAGNOSIS — E7849 Other hyperlipidemia: Secondary | ICD-10-CM

## 2020-05-18 NOTE — Telephone Encounter (Signed)
Requested Prescriptions  Pending Prescriptions Disp Refills  . gemfibrozil (LOPID) 600 MG tablet [Pharmacy Med Name: GEMFIBROZIL 600 MG TABLET] 90 tablet 3    Sig: TAKE 1 TABLET BY MOUTH EVERY DAY     Cardiovascular:  Antilipid - Fibric Acid Derivatives Failed - 05/18/2020 10:00 AM      Failed - LDL in normal range and within 360 days    LDL Chol Calc (NIH)  Date Value Ref Range Status  09/07/2019 116 (H) 0 - 99 mg/dL Final         Failed - ALT in normal range and within 180 days    ALT  Date Value Ref Range Status  02/15/2019 13 0 - 32 IU/L Final         Failed - AST in normal range and within 180 days    AST  Date Value Ref Range Status  02/15/2019 16 0 - 40 IU/L Final         Passed - Total Cholesterol in normal range and within 360 days    Cholesterol, Total  Date Value Ref Range Status  09/07/2019 197 100 - 199 mg/dL Final         Passed - HDL in normal range and within 360 days    HDL  Date Value Ref Range Status  09/07/2019 60 >39 mg/dL Final         Passed - Triglycerides in normal range and within 360 days    Triglycerides  Date Value Ref Range Status  09/07/2019 119 0 - 149 mg/dL Final         Passed - Cr in normal range and within 180 days    Creatinine, Ser  Date Value Ref Range Status  05/08/2020 0.79 0.57 - 1.00 mg/dL Final         Passed - eGFR in normal range and within 180 days    GFR calc Af Amer  Date Value Ref Range Status  05/08/2020 87 >59 mL/min/1.73 Final    Comment:    **[facility] currently reports eGFR in compliance with the current**   recommendations of the National Kid[employer] Foundation. [facility] will   update reporting as new guidelines are published from the NKF-ASN   Task force.    GFR calc non Af Amer  Date Value Ref Range Status  05/08/2020 76 >59 mL/min/1.73 Final         Passed - Valid encounter within last 12 months    Recent Outpatient Visits          2 weeks ago Exposure to Streptococcal pharyngitis   [facility] Jones, Deanna C, MD   1 month ago Encounter for examination following treatment at hospital   [facility] Jones, Deanna C, MD   1 month ago Confusion and disorientation   [facility] Jones, Deanna C, MD   8 months ago Essential (primary) hypertension   [facility] Jones, Deanna C, MD   1 year ago Cystitis   [facility] Jones, Deanna C, MD               

## 2020-05-25 DIAGNOSIS — Z23 Encounter for immunization: Secondary | ICD-10-CM | POA: Diagnosis not present

## 2020-05-29 ENCOUNTER — Other Ambulatory Visit: Payer: Self-pay | Admitting: Family Medicine

## 2020-05-29 DIAGNOSIS — I10 Essential (primary) hypertension: Secondary | ICD-10-CM

## 2020-05-29 NOTE — Telephone Encounter (Signed)
Requested Prescriptions  Pending Prescriptions Disp Refills   losartan (COZAAR) 25 MG tablet [Pharmacy Med Name: LOSARTAN POTASSIUM 25 MG TAB] 180 tablet 1    Sig: TAKE 2 TABLETS BY MOUTH EVERY DAY     Cardiovascular:  Angiotensin Receptor Blockers Passed - 05/29/2020  1:21 AM      Passed - Cr in normal range and within 180 days    Creatinine, Ser  Date Value Ref Range Status  05/08/2020 0.79 0.57 - 1.00 mg/dL Final         Passed - K in normal range and within 180 days    Potassium  Date Value Ref Range Status  05/08/2020 5.1 3.5 - 5.2 mmol/L Final         Passed - Patient is not pregnant      Passed - Last BP in normal range    BP Readings from Last 1 Encounters:  05/03/20 120/80         Passed - Valid encounter within last 6 months    Recent Outpatient Visits          3 weeks ago Exposure to Streptococcal pharyngitis   [facility] Juline Patch, MD   1 month ago Encounter for examination following treatment at hospital   [facility] Juline Patch, [facility]   1 month ago Confusion and disorientation   [facility] Juline Patch, MD   8 months ago Essential (primary) hypertension   [facility] Juline Patch, MD   1 year ago Cystitis   [facility] Juline Patch, MD

## 2020-05-30 ENCOUNTER — Other Ambulatory Visit: Payer: Self-pay | Admitting: Family Medicine

## 2020-05-30 DIAGNOSIS — F32A Depression, unspecified: Secondary | ICD-10-CM

## 2020-06-08 DIAGNOSIS — S199XXA Unspecified injury of neck, initial encounter: Secondary | ICD-10-CM | POA: Diagnosis not present

## 2020-06-08 DIAGNOSIS — R600 Localized edema: Secondary | ICD-10-CM | POA: Diagnosis not present

## 2020-06-08 DIAGNOSIS — S3993XA Unspecified injury of pelvis, initial encounter: Secondary | ICD-10-CM | POA: Diagnosis not present

## 2020-06-08 DIAGNOSIS — S0121XA Laceration without foreign body of nose, initial encounter: Secondary | ICD-10-CM | POA: Diagnosis not present

## 2020-06-08 DIAGNOSIS — I517 Cardiomegaly: Secondary | ICD-10-CM | POA: Diagnosis not present

## 2020-06-08 DIAGNOSIS — S01111A Laceration without foreign body of right eyelid and periocular area, initial encounter: Secondary | ICD-10-CM | POA: Diagnosis not present

## 2020-06-08 DIAGNOSIS — Z87891 Personal history of nicotine dependence: Secondary | ICD-10-CM | POA: Diagnosis not present

## 2020-06-08 DIAGNOSIS — S299XXA Unspecified injury of thorax, initial encounter: Secondary | ICD-10-CM | POA: Diagnosis not present

## 2020-06-08 DIAGNOSIS — Z981 Arthrodesis status: Secondary | ICD-10-CM | POA: Diagnosis not present

## 2020-06-08 DIAGNOSIS — S0993XA Unspecified injury of face, initial encounter: Secondary | ICD-10-CM | POA: Diagnosis not present

## 2020-06-08 DIAGNOSIS — I10 Essential (primary) hypertension: Secondary | ICD-10-CM | POA: Diagnosis not present

## 2020-06-08 DIAGNOSIS — Z5181 Encounter for therapeutic drug level monitoring: Secondary | ICD-10-CM | POA: Diagnosis not present

## 2020-06-08 DIAGNOSIS — S0240CA Maxillary fracture, right side, initial encounter for closed fracture: Secondary | ICD-10-CM | POA: Diagnosis not present

## 2020-06-08 DIAGNOSIS — Z6831 Body mass index (BMI) 31.0-31.9, adult: Secondary | ICD-10-CM | POA: Diagnosis not present

## 2020-06-08 DIAGNOSIS — K828 Other specified diseases of gallbladder: Secondary | ICD-10-CM | POA: Diagnosis not present

## 2020-06-08 DIAGNOSIS — S0990XA Unspecified injury of head, initial encounter: Secondary | ICD-10-CM | POA: Diagnosis not present

## 2020-06-08 DIAGNOSIS — S01411A Laceration without foreign body of right cheek and temporomandibular area, initial encounter: Secondary | ICD-10-CM | POA: Diagnosis not present

## 2020-06-08 DIAGNOSIS — R0902 Hypoxemia: Secondary | ICD-10-CM | POA: Diagnosis not present

## 2020-06-08 DIAGNOSIS — M5134 Other intervertebral disc degeneration, thoracic region: Secondary | ICD-10-CM | POA: Diagnosis not present

## 2020-06-08 DIAGNOSIS — M5030 Other cervical disc degeneration, unspecified cervical region: Secondary | ICD-10-CM | POA: Diagnosis not present

## 2020-06-08 DIAGNOSIS — R9431 Abnormal electrocardiogram [ECG] [EKG]: Secondary | ICD-10-CM | POA: Diagnosis not present

## 2020-06-08 DIAGNOSIS — I445 Left posterior fascicular block: Secondary | ICD-10-CM | POA: Diagnosis not present

## 2020-06-08 DIAGNOSIS — E669 Obesity, unspecified: Secondary | ICD-10-CM | POA: Diagnosis not present

## 2020-06-08 DIAGNOSIS — S3992XA Unspecified injury of lower back, initial encounter: Secondary | ICD-10-CM | POA: Diagnosis not present

## 2020-06-08 DIAGNOSIS — W1839XA Other fall on same level, initial encounter: Secondary | ICD-10-CM | POA: Diagnosis not present

## 2020-06-08 DIAGNOSIS — S0181XA Laceration without foreign body of other part of head, initial encounter: Secondary | ICD-10-CM | POA: Diagnosis not present

## 2020-06-08 DIAGNOSIS — S0501XA Injury of conjunctiva and corneal abrasion without foreign body, right eye, initial encounter: Secondary | ICD-10-CM | POA: Diagnosis not present

## 2020-06-08 DIAGNOSIS — S3991XA Unspecified injury of abdomen, initial encounter: Secondary | ICD-10-CM | POA: Diagnosis not present

## 2020-06-08 DIAGNOSIS — S0231XA Fracture of orbital floor, right side, initial encounter for closed fracture: Secondary | ICD-10-CM | POA: Diagnosis not present

## 2020-06-08 DIAGNOSIS — Z79810 Long term (current) use of selective estrogen receptor modulators (SERMs): Secondary | ICD-10-CM | POA: Diagnosis not present

## 2020-06-08 DIAGNOSIS — Z20822 Contact with and (suspected) exposure to covid-19: Secondary | ICD-10-CM | POA: Diagnosis not present

## 2020-06-08 DIAGNOSIS — W19XXXA Unspecified fall, initial encounter: Secondary | ICD-10-CM | POA: Diagnosis not present

## 2020-06-09 DIAGNOSIS — I517 Cardiomegaly: Secondary | ICD-10-CM | POA: Diagnosis not present

## 2020-06-09 DIAGNOSIS — S0181XA Laceration without foreign body of other part of head, initial encounter: Secondary | ICD-10-CM | POA: Diagnosis not present

## 2020-06-09 DIAGNOSIS — S0231XA Fracture of orbital floor, right side, initial encounter for closed fracture: Secondary | ICD-10-CM | POA: Diagnosis not present

## 2020-06-09 DIAGNOSIS — R0902 Hypoxemia: Secondary | ICD-10-CM | POA: Diagnosis not present

## 2020-06-09 DIAGNOSIS — W19XXXA Unspecified fall, initial encounter: Secondary | ICD-10-CM | POA: Diagnosis not present

## 2020-06-09 DIAGNOSIS — W1839XA Other fall on same level, initial encounter: Secondary | ICD-10-CM | POA: Diagnosis not present

## 2020-06-10 ENCOUNTER — Telehealth: Payer: Self-pay | Admitting: Family Medicine

## 2020-06-10 NOTE — Telephone Encounter (Signed)
Left message for patient to call back and schedule Medicare Annual Wellness Visit (AWV) either virtually/audio only or in office. Whichever the patients preference is.  Last AWV 01/25/19; please schedule at anytime with [facility] Advisor.  This should be a 40 minute visit.

## 2020-06-17 DIAGNOSIS — S0231XD Fracture of orbital floor, right side, subsequent encounter for fracture with routine healing: Secondary | ICD-10-CM | POA: Diagnosis not present

## 2020-06-17 DIAGNOSIS — W010XXD Fall on same level from slipping, tripping and stumbling without subsequent striking against object, subsequent encounter: Secondary | ICD-10-CM | POA: Diagnosis not present

## 2020-06-18 DIAGNOSIS — S0231XD Fracture of orbital floor, right side, subsequent encounter for fracture with routine healing: Secondary | ICD-10-CM | POA: Diagnosis not present

## 2020-06-18 DIAGNOSIS — W1839XD Other fall on same level, subsequent encounter: Secondary | ICD-10-CM | POA: Diagnosis not present

## 2020-07-03 DIAGNOSIS — W1839XD Other fall on same level, subsequent encounter: Secondary | ICD-10-CM | POA: Diagnosis not present

## 2020-07-03 DIAGNOSIS — S0285XD Fracture of orbit, unspecified, subsequent encounter for fracture with routine healing: Secondary | ICD-10-CM | POA: Diagnosis not present

## 2020-07-04 IMAGING — CT CT L SPINE W/O CM
3 series · 13 of 33 positions shown, 16 images · non-contrast
Comparison: MRI lumbar spine 06/02/2018.

CLINICAL DATA: Unable to feel legs after lumbar spine fusion
procedure. Leg weakness with incontinence.

EXAM:
CT LUMBAR SPINE WITHOUT CONTRAST
TECHNIQUE: Multidetector CT imaging of the lumbar spine was performed without
intravenous contrast administration. Multiplanar CT image
reconstructions were also generated.

[Series 4: l spine soft (person_name) · axial · 0.43mm/px · z∈[+336,+500]mm · 5 of 120 slices shown, 7 images]
[im 19/120  soft-tissue]
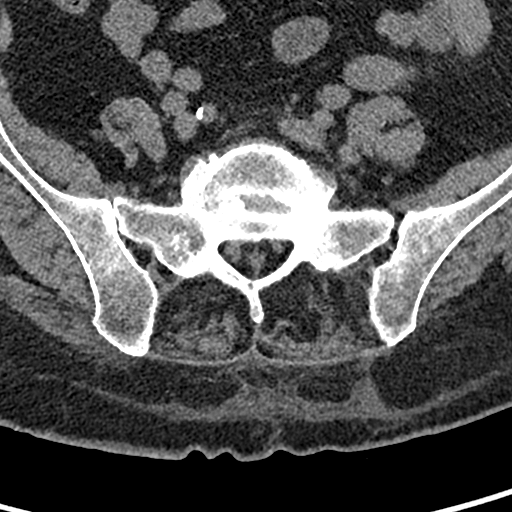
[im 19/120  bone]
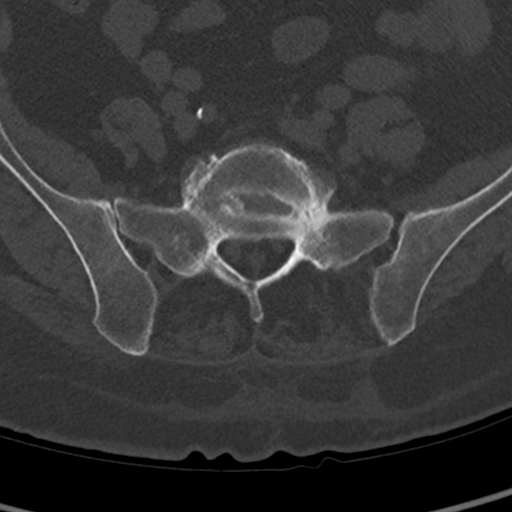
[im 37/120  bone]
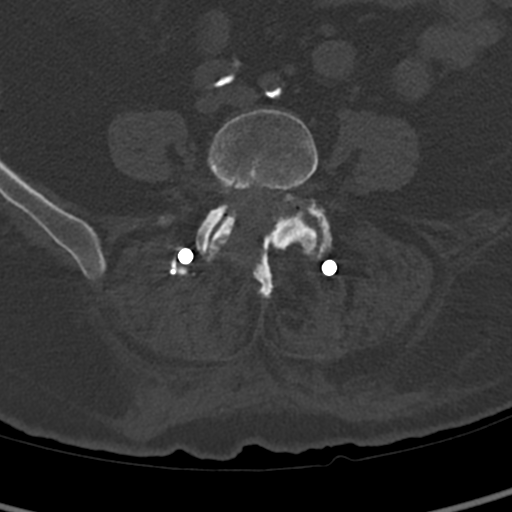
[im 65/120  bone]
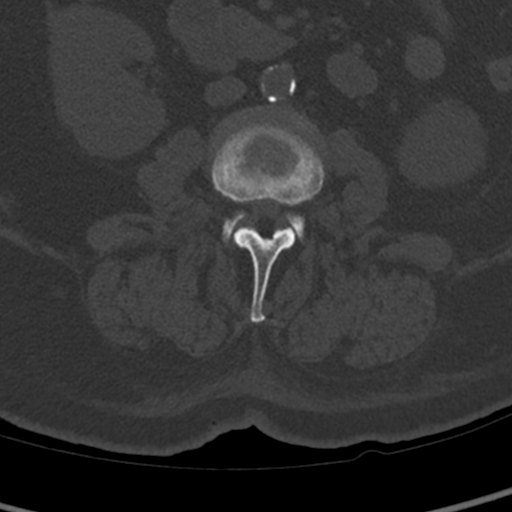
[im 83/120  bone]
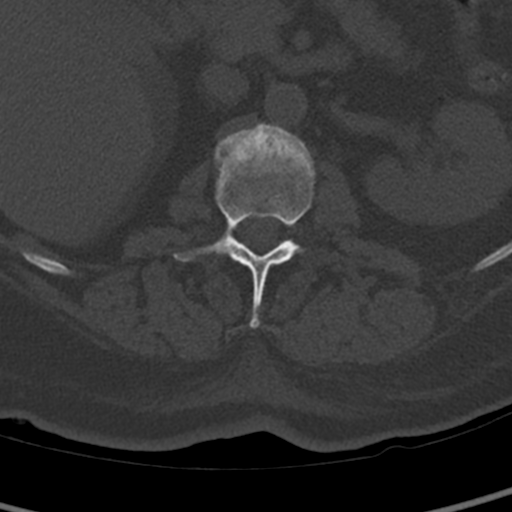
[im 101/120  soft-tissue]
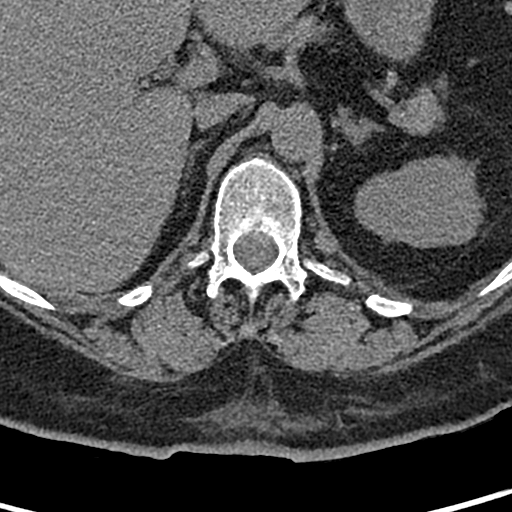
[im 101/120  bone]
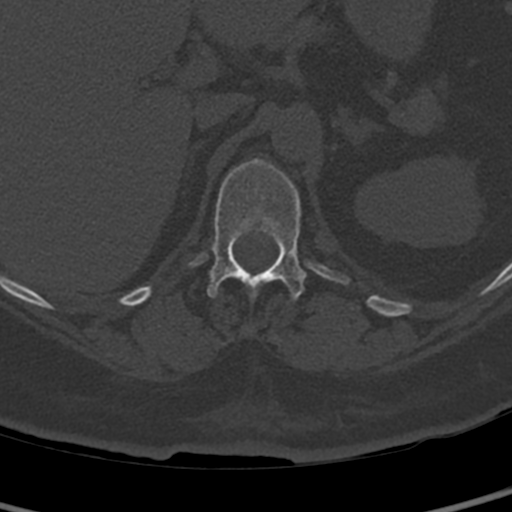

[Series 5: sagittal bone · sagittal · 0.38mm/px · 5 of 79 slices shown, 6 images]
[im 27/79  bone]
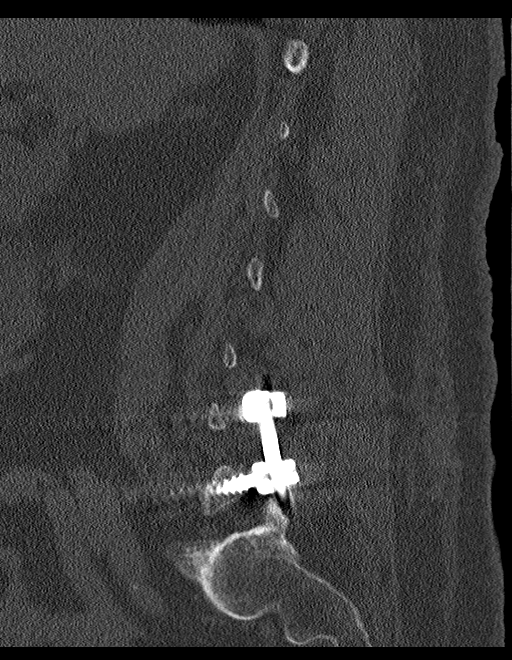
[im 33/79  bone]
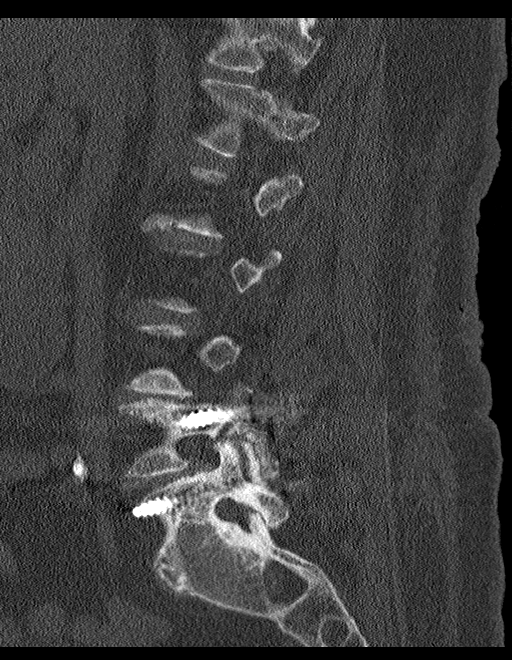
[im 40/79  soft-tissue]
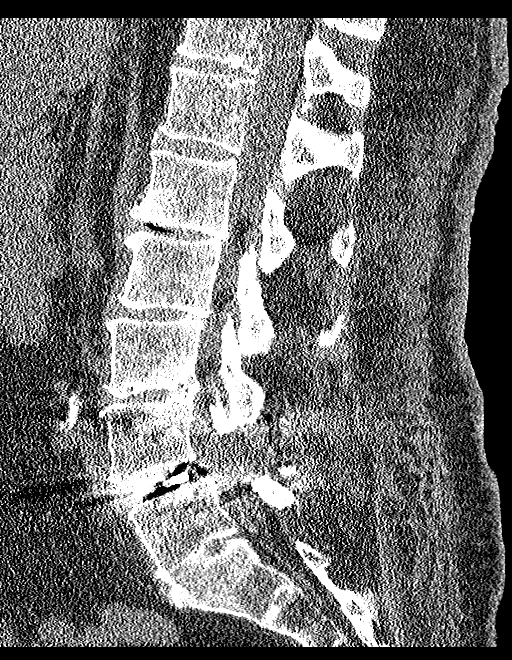
[im 40/79  bone]
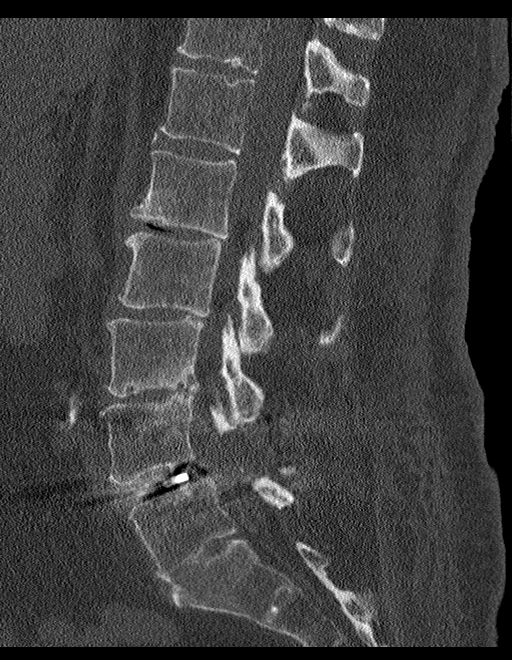
[im 46/79  bone]
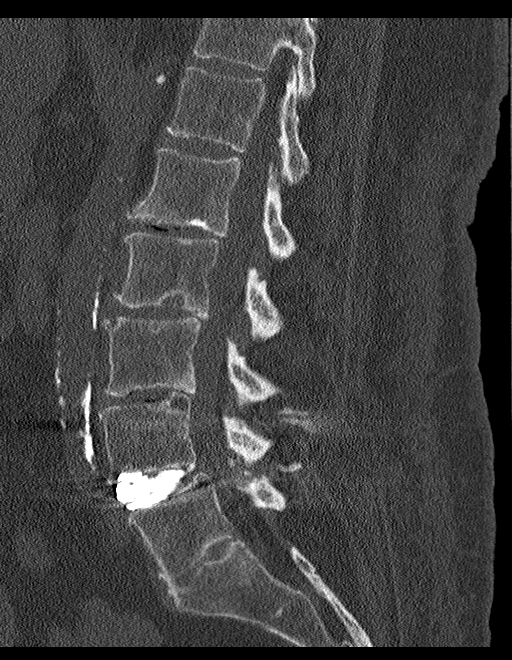
[im 53/79  bone]
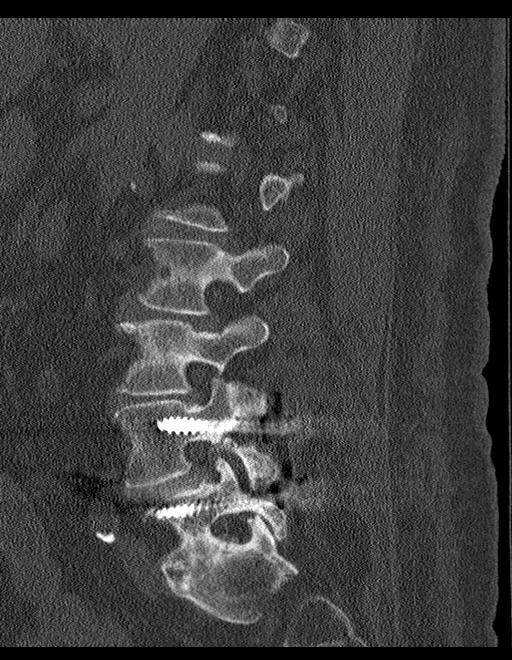

[Series 6: coronal bone · coronal · 0.35mm/px · 3 of 93 slices shown]
[im 19/93  bone]
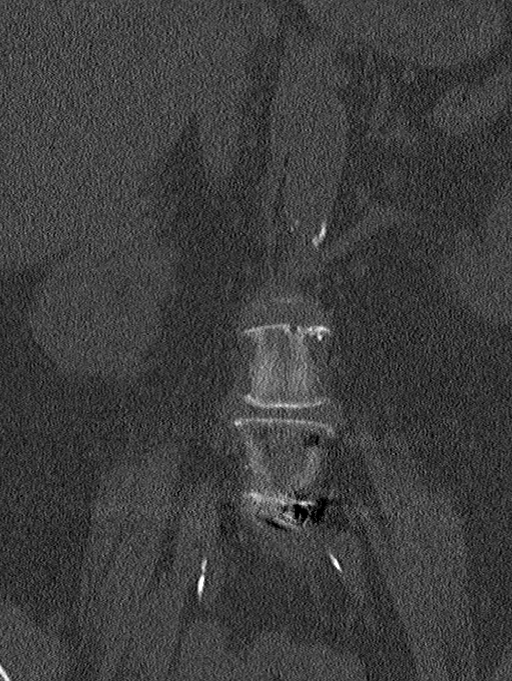
[im 37/93  bone]
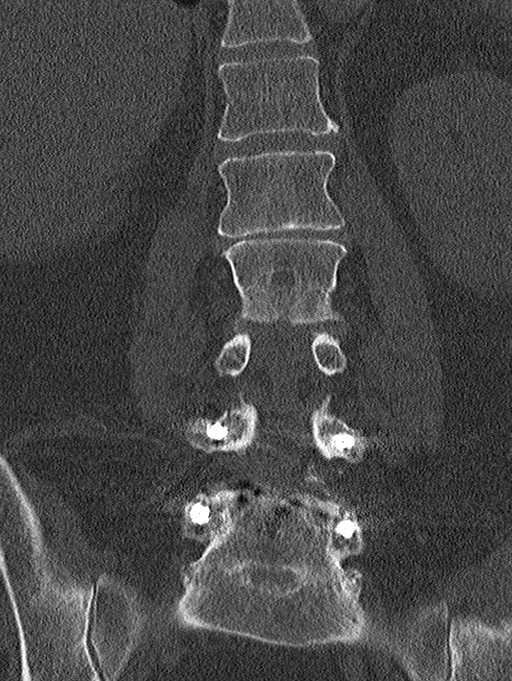
[im 56/93  bone]
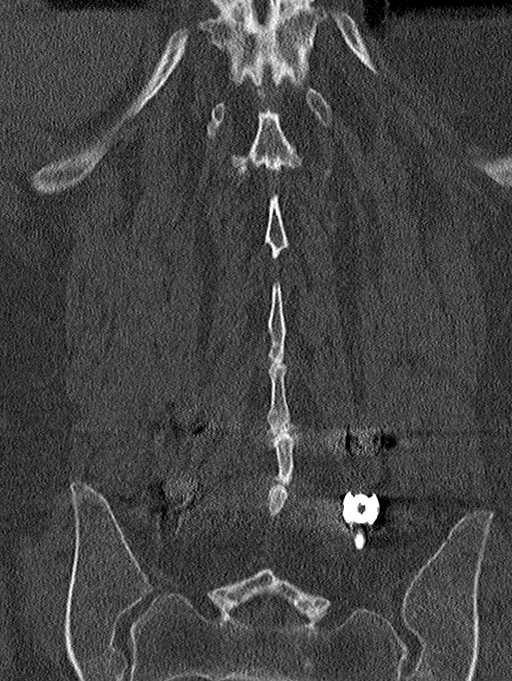

[13 of 33 positions shown; findings below may reference images not displayed]

FINDINGS: Segmentation: 5 lumbar type vertebrae.

Alignment: 3 mm anterolisthesis L4-5. Trace retrolisthesis at L1-2,
L2-3, and L3-4 compensatory.

Vertebrae: Recent L4-5 TLIF, described below.

Paraspinal and other soft tissues: No extra-spinal hematoma or
postoperative fluid collection at the surgical site. Aortic
atherosclerosis.

Disc levels:

L1-L2: Chronic disc space narrowing, borderline stenosis. No
impingement.

L2-L3: Chronic disc space narrowing. Posterior element hypertrophy.
Osseous spurring. Moderate stenosis. No acute findings.

L3-L4: Disc space narrowing. Posterior element hypertrophy. Osseous
spurring. Moderate stenosis. No acute findings.

L4-L5: Status post RIGHT L4-5 partial facetectomy and laminectomy
for TLIF. Interbody cage appropriately centered, within the
interspace. Slight reduction of degenerative spondylolisthesis, now
3 mm compared to 4 mm before surgery. BILATERAL L4 and BILATERAL L5
pedicle screws demonstrate no aberrant placement or medialization.
Assessment for canal hematoma is limited by Hounsfield artifact.
Facet arthropathy and ligamentum flavum calcification, greater on
the LEFT. BILATERAL foraminal narrowing appears stable.

L5-S1: Chronic disc space narrowing. Osseous spurring to the RIGHT.
Functional fusion across this interspace. No definite impingement.
IMPRESSION: Status post L4-5 TLIF, with hardware/pedicle screws appropriately
placed and interbody cage centered within the interspace. Slight
reduction in degenerative spondylolisthesis, to 3 mm.

Assessment for canal hematoma is limited by Hounsfield artifact. No
extra-spinal hematoma or postoperative fluid collection is observed.

## 2020-07-05 ENCOUNTER — Telehealth: Payer: Self-pay | Admitting: *Deleted

## 2020-07-05 NOTE — Chronic Care Management (AMB) (Signed)
  Chronic Care Management   Note  07/05/2020 Name: Danielle Taylor MRN: [id number] DOB: 1946/06/20  Danielle Taylor is a 74 y.o. year old female who is a primary care patient of Juline Patch, MD. I reached out to Debara Pickett by phone today in response to a referral sent by Ms. [name]'s health plan.     [name] was given information about Chronic Care Management services today including:  1. CCM service includes personalized support from designated clinical staff supervised by her physician, including individualized plan of care and coordination with other care providers 2. 24/7 contact phone numbers for assistance for urgent and routine care needs. 3. Service will only be billed when office clinical staff spend 20 minutes or more in a month to coordinate care. 4. Only one practitioner may furnish and bill the service in a calendar month. 5. The patient may stop CCM services at any time (effective at the end of the month) by phone call to the office staff. 6. The patient will be responsible for cost sharing (co-pay) of up to 20% of the service fee (after annual deductible is met).  Patient agreed to services and verbal consent obtained.   Follow up plan: Telephone appointment with care management team member scheduled for:07/11/2020  [facility]  Direct Dial: [phone]

## 2020-07-11 ENCOUNTER — Telehealth: Payer: Medicare Other

## 2020-07-11 NOTE — Chronic Care Management (AMB) (Deleted)
Chronic Care Management Pharmacy  Name: Danielle Taylor  MRN: [id number] DOB: 1945-09-30   Chief Complaint/ HPI  [place],  74 y.o. , female presents for their Initial CCM visit with the clinical pharmacist via telephone.  PCP : Juline Patch, MD Patient Care Team: Juline Patch, MD as PCP - General (Family Medicine) Erby Pian, MD as Consulting Physician (Specialist) Vladimir Faster, [facility] (Pharmacist)  Their chronic conditions include: Hypertension, Hyperlipidemia, Atrial Fibrillation, Coronary Artery Disease, COPD, Hypothyroidism, Depression and Allergic Rhinitis   Office Visits: 05/03/20- Dr. Ronnald Ramp- strep pharyngitis, Azithromycin, flu vaccine 04/10/20- Dr. Ronnald Ramp- hospital follow up, neuro eval normal  Consult Visit: 06/08/20- [place] [facility]- fall on asphalt from standing - corneal abrasion, rt orbital floor fracture  Allergies  Allergen Reactions  . Baclofen Other (See Comments)    Weakness, confusion, tremors.    . Penicillins Other (See Comments)    Has patient had a PCN reaction causing immediate rash, facial/tongue/throat swelling, SOB or lightheadedness with hypotension: No Has patient had a PCN reaction causing severe rash involving mucus membranes or skin necrosis: No Has patient had a PCN reaction that required hospitalization No Has patient had a PCN reaction occurring within the last 10 years: No If all of the above answers are "NO", then may proceed with Cephalosporin use.     Medications: Outpatient Encounter Medications as of 07/11/2020  Medication Sig  . acetaminophen (TYLENOL) 500 MG tablet Take 1,000 mg by mouth every 8 (eight) hours as needed (pain).  Marland Kitchen azithromycin (ZITHROMAX) 250 MG tablet 2 today then 1 a day for 4 days  . citalopram (CELEXA) 40 MG tablet TAKE 1 TABLET BY MOUTH EVERY DAY  . gabapentin (NEURONTIN) 300 MG capsule Take 600 mg by mouth 3 (three) times daily. [place]  . gemfibrozil (LOPID) 600 MG tablet TAKE 1  TABLET BY MOUTH EVERY DAY  . levothyroxine (SYNTHROID) 112 MCG tablet TAKE 1 TABLET BY MOUTH EVERY DAY  . loratadine (CLARITIN) 10 MG tablet TAKE 1 TABLET BY MOUTH EVERY DAY  . losartan (COZAAR) 25 MG tablet TAKE 2 TABLETS BY MOUTH EVERY DAY  . metoprolol succinate (TOPROL-XL) 50 MG 24 hr tablet TAKE 1 TABLET BY MOUTH EVERY DAY  . montelukast (SINGULAIR) 10 MG tablet TAKE 1 TABLET BY MOUTH EVERYDAY AT BEDTIME  . Multiple Vitamins-Minerals (CENTRUM SILVER 50+WOMEN PO) Take 1 tablet by mouth daily.  Marland Kitchen nystatin cream (MYCOSTATIN) APPLY TO AFFECTED AREA TWICE A DAY (Patient not taking: Reported on 05/03/2020)  . oxybutynin (DITROPAN-XL) 10 MG 24 hr tablet Take 1 tablet (10 mg total) by mouth at bedtime.  . rivaroxaban (XARELTO) 20 MG TABS tablet Take 20 mg by mouth daily with supper.  . simvastatin (ZOCOR) 20 MG tablet TAKE 1 TABLET BY MOUTH EVERY DAY   Facility-Administered Encounter Medications as of 07/11/2020  Medication  . albuterol (PROVENTIL) (2.5 MG/3ML) 0.083% nebulizer solution 2.5 mg  . ipratropium-albuterol (DUONEB) 0.5-2.5 (3) MG/3ML nebulizer solution 3 mL    Wt Readings from Last 3 Encounters:  05/03/20 188 lb (85.3 kg)  04/10/20 183 lb (83 kg)  04/08/20 183 lb (83 kg)    Current Diagnosis/Assessment:    Goals Addressed   None     AFIB/ Cardiac Paemaker   CBC Latest Ref Rng & Units 06/23/2018 03/12/2016 03/11/2016  WBC 4.0 - 10.5 K/uL 7.1 6.6 7.1  Hemoglobin 12.0 - 15.0 g/dL 14.1 13.9 14.3  Hematocrit 36 - 46 % 42.2 40.4 42.3  Platelets 150 -  400 K/uL 293 216 245    Patient is currently rate controlled. HR *** BPM  Patient has failed these meds in past: NA Patient is currently controlled on the following medications:   Metoprolol succinate 25 mg qd  rivaroxaban 20 mg qd  We discussed:  {CHL HP Upstream Pharmacy [phone]}  Plan  Continue {CHL HP Upstream Pharmacy [phone]}  Hypertension   BP goal is:  <130/80  Office blood  pressures are  BP Readings from Last 3 Encounters:  05/03/20 120/80  04/10/20 122/70  04/08/20 134/78   BMP Latest Ref Rng & Units 05/08/2020 04/10/2020 09/07/2019  Glucose 65 - 99 mg/dL 101(H) 98 86  BUN 8 - 27 mg/dL 23 20 16   Creatinine 0.57 - 1.00 mg/dL 0.79 1.13(H) 0.79  BUN/Creat Ratio 12 - 28 29(H) - 20  Sodium 134 - 144 mmol/L 139 130(L) 140  Potassium 3.5 - 5.2 mmol/L 5.1 3.9 4.9  Chloride 96 - 106 mmol/L 102 100 104  CO2 20 - 29 mmol/L 23 20(L) 24  Calcium 8.7 - 10.3 mg/dL 9.7 8.8(L) 9.6    Patient checks BP at home {CHL HP BP Monitoring Frequency:[phone]} Patient home BP readings are ranging: ***  Patient has failed these meds in the past: *** Patient is currently {CHL Controlled/Uncontrolled:[phone]} on the following medications:  . Losartan 25 mg qd . Metoprolol succinate 25 mg qd  We discussed {CHL HP Upstream Pharmacy [phone]}  Plan  Continue {CHL HP Upstream Pharmacy [phone]}     Hypothyroidism   Lab Results  Component Value Date/Time   TSH 2.940 09/07/2019 01:51 PM   TSH 0.095 (L) 02/15/2019 11:27 AM    Patient has failed these meds in past: *** Patient is currently {CHL Controlled/Uncontrolled:[phone]} on the following medications:  . Levothyroxine 112 mcg qd  We discussed:  {CHL HP Upstream Pharmacy [phone]}  Plan  Continue {CHL HP Upstream Pharmacy [phone]} Depression / Anxiety   PHQ9 Score:  PHQ9 SCORE ONLY 04/08/2020 09/07/2019 02/15/2019  PHQ-9 Total Score 0 0 0   GAD7 Score: GAD 7 : Generalized Anxiety Score 04/08/2020 09/07/2019  Nervous, Anxious, on Edge 0 0  Control/stop worrying 0 0  Worry too much - different things 0 0  Trouble relaxing 0 0  Restless 0 0  Easily annoyed or irritable 0 0  Afraid - awful might happen 0 0  Total GAD 7 Score 0 0  Anxiety Difficulty Not difficult at all -    Patient has failed these meds in past: *** Patient is currently {CHL  Controlled/Uncontrolled:[phone]} on the following medications:  . Citalopram 33m qd  We discussed:  ***  Plan  Continue {CHL HP Upstream Pharmacy [phone]}  Hyperlipidemia   LDL goal < 70  Last lipids Lab Results  Component Value Date   CHOL 197 09/07/2019   HDL 60 09/07/2019   LDLCALC 116 (H) 09/07/2019   TRIG 119 09/07/2019   CHOLHDL 3.5 02/08/2017   Hepatic Function Latest Ref Rng & Units 05/08/2020 04/10/2020 09/07/2019  Total Protein 6.0 - 8.5 g/dL - - -  Albumin 3.7 - 4.7 g/dL 4.4 4.1 4.4  AST 0 - 40 IU/L - - -  ALT 0 - 32 IU/L - - -  Alk Phosphatase 39 - 117 IU/L - - -  Total Bilirubin 0.0 - 1.2 mg/dL - - -  Bilirubin, Direct 0.00 - 0.40 mg/dL - - -     The 10-year ASCVD risk score (Mikey BussingDC Jr., et al., 2013) is: 16.4%  Values used to calculate the score:     Age: 83 years     Sex: Female     Is Non-Hispanic African American: No     Diabetic: No     Tobacco smoker: No     Systolic Blood Pressure: 093 mmHg     Is BP treated: Yes     HDL Cholesterol: 60 mg/dL     Total Cholesterol: 197 mg/dL   Patient has failed these meds in past:  Patient is currently query  controlled on the following medications:  . simvastatin 20 mg qd . Gemfibrozil 600 mg qd  We discussed:  {CHL HP Upstream Pharmacy discussion:[phone]}  Plan  Continue {CHL HP Upstream Pharmacy Plans:[phone]}  Osteopenia / Osteoporosis   Last DEXA Scan: 12/02/2017  T-Score femoral neck: -0.  T-Score lumbar spine: 0.9    No results found for: VD25OH   Patient is not a candidate for pharmacologic treatment  Patient has failed these meds in past: NA Patient is currently controlled on the following medications:  . Centrum silver womens MVI daily  We discussed:  Recommend [phone] units of vitamin D daily. Recommend 1200 mg of calcium daily from dietary and supplemental sources. Recommend weight-bearing and muscle strengthening exercises for building and maintaining bone  density.  Plan  Continue {CHL HP Upstream Pharmacy Plans:[phone]}  Allergic rhinitis/Reactive airway disease   Patient has failed these meds in past: *** Patient is currently {CHL Controlled/[phone]} on the following medications:  Marland Kitchen Montelukast 10 mg qd . Loratadine 10 mg qd  We discussed:  ***  Plan  Continue {CHL HP Upstream Pharmacy Plans:[phone]}  Chronic low back pain/lumbar radiculopathy   Patient has failed these meds in past: *** Patient is currently {CHL Controlled/[phone]} on the following medications:  . ***  We discussed:  ***  Plan  Continue {CHL HP Upstream Pharmacy ATFTD:[id number]}   Vaccines   Reviewed and discussed patient's vaccination history.    Immunization History  Administered Date(s) Administered  . Fluad Quad(high Dose 65+) 05/12/2019  . Influenza Nasal 06/08/2017  . Influenza, High Dose Seasonal PF 06/11/2017, 05/03/2018  . Influenza, Seasonal, Injecte, Preservative Fre 05/28/2011, 06/21/2012  . Influenza,inj,Quad PF,6+ Mos 07/15/2015, 05/28/2016  . Influenza-Unspecified 05/24/2014, 07/15/2015, 05/28/2016  . PFIZER SARS-COV-2 Vaccination 10/14/2019, 11/07/2019  . PPD Test 04/05/2017  . Pneumococcal Conjugate-13 10/22/2014  . Pneumococcal Polysaccharide-23 11/15/2017    Plan  Recommended patient receive *** vaccine in *** office.    Medication Management   Pt uses *** pharmacy for all medications Uses pill box? {Yes or If no, why not?:20788} Pt endorses ***% compliance  We discussed: {Pharmacy options:24294}  Plan  {US Pharmacy WCBJ:62831}    Follow up: *** month phone visit  ***

## 2020-07-25 DIAGNOSIS — I480 Paroxysmal atrial fibrillation: Secondary | ICD-10-CM | POA: Diagnosis not present

## 2020-07-25 DIAGNOSIS — I498 Other specified cardiac arrhythmias: Secondary | ICD-10-CM | POA: Diagnosis not present

## 2020-07-25 DIAGNOSIS — Z7901 Long term (current) use of anticoagulants: Secondary | ICD-10-CM | POA: Diagnosis not present

## 2020-07-25 DIAGNOSIS — Z950 Presence of cardiac pacemaker: Secondary | ICD-10-CM | POA: Diagnosis not present

## 2020-07-25 DIAGNOSIS — I5189 Other ill-defined heart diseases: Secondary | ICD-10-CM | POA: Diagnosis not present

## 2020-07-25 DIAGNOSIS — I442 Atrioventricular block, complete: Secondary | ICD-10-CM | POA: Diagnosis not present

## 2020-07-25 DIAGNOSIS — Z45018 Encounter for adjustment and management of other part of cardiac pacemaker: Secondary | ICD-10-CM | POA: Diagnosis not present

## 2020-08-13 DIAGNOSIS — Z23 Encounter for immunization: Secondary | ICD-10-CM | POA: Diagnosis not present

## 2020-08-31 ENCOUNTER — Other Ambulatory Visit: Payer: Self-pay | Admitting: Family Medicine

## 2020-08-31 DIAGNOSIS — E039 Hypothyroidism, unspecified: Secondary | ICD-10-CM

## 2020-08-31 DIAGNOSIS — J301 Allergic rhinitis due to pollen: Secondary | ICD-10-CM

## 2020-09-20 ENCOUNTER — Other Ambulatory Visit: Payer: Self-pay | Admitting: Family Medicine

## 2020-09-20 DIAGNOSIS — E782 Mixed hyperlipidemia: Secondary | ICD-10-CM

## 2020-09-20 NOTE — Telephone Encounter (Signed)
Called patient left voicemail to call back and set up refill medicine appointment.

## 2020-09-25 ENCOUNTER — Encounter: Payer: Self-pay | Admitting: Family Medicine

## 2020-09-25 ENCOUNTER — Other Ambulatory Visit: Payer: Self-pay

## 2020-09-25 ENCOUNTER — Ambulatory Visit (INDEPENDENT_AMBULATORY_CARE_PROVIDER_SITE_OTHER): Payer: Medicare Other | Admitting: Family Medicine

## 2020-09-25 VITALS — BP 130/90 | HR 76 | Ht 64.0 in | Wt 192.0 lb

## 2020-09-25 DIAGNOSIS — E039 Hypothyroidism, unspecified: Secondary | ICD-10-CM | POA: Diagnosis not present

## 2020-09-25 DIAGNOSIS — R053 Chronic cough: Secondary | ICD-10-CM

## 2020-09-25 DIAGNOSIS — E782 Mixed hyperlipidemia: Secondary | ICD-10-CM | POA: Diagnosis not present

## 2020-09-25 DIAGNOSIS — N319 Neuromuscular dysfunction of bladder, unspecified: Secondary | ICD-10-CM

## 2020-09-25 DIAGNOSIS — E7849 Other hyperlipidemia: Secondary | ICD-10-CM

## 2020-09-25 DIAGNOSIS — J4520 Mild intermittent asthma, uncomplicated: Secondary | ICD-10-CM

## 2020-09-25 DIAGNOSIS — F32A Depression, unspecified: Secondary | ICD-10-CM | POA: Diagnosis not present

## 2020-09-25 DIAGNOSIS — U099 Post covid-19 condition, unspecified: Secondary | ICD-10-CM

## 2020-09-25 DIAGNOSIS — I10 Essential (primary) hypertension: Secondary | ICD-10-CM

## 2020-09-25 DIAGNOSIS — J301 Allergic rhinitis due to pollen: Secondary | ICD-10-CM | POA: Diagnosis not present

## 2020-09-25 MED ORDER — LEVOTHYROXINE SODIUM 112 MCG PO TABS
112.0000 ug | ORAL_TABLET | Freq: Every day | ORAL | 1 refills | Status: DC
Start: 2020-09-25 — End: 2021-03-26

## 2020-09-25 MED ORDER — LOSARTAN POTASSIUM 25 MG PO TABS: 50.0000 mg | ORAL_TABLET | Freq: Every day | ORAL | 1 refills | Status: DC

## 2020-09-25 MED ORDER — METOPROLOL SUCCINATE ER 50 MG PO TB24: 50.0000 mg | ORAL_TABLET | Freq: Every day | ORAL | 1 refills | Status: DC

## 2020-09-25 MED ORDER — MONTELUKAST SODIUM 10 MG PO TABS: ORAL_TABLET | ORAL | 1 refills | Status: DC

## 2020-09-25 MED ORDER — GEMFIBROZIL 600 MG PO TABS: 600.0000 mg | ORAL_TABLET | Freq: Every day | ORAL | 1 refills | Status: DC

## 2020-09-25 MED ORDER — LORATADINE 10 MG PO TABS: 10.0000 mg | ORAL_TABLET | Freq: Every day | ORAL | 1 refills | Status: DC

## 2020-09-25 MED ORDER — BENZONATATE 100 MG PO CAPS: 100.0000 mg | ORAL_CAPSULE | Freq: Two times a day (BID) | ORAL | 0 refills | Status: DC | PRN

## 2020-09-25 MED ORDER — CITALOPRAM HYDROBROMIDE 40 MG PO TABS: 40.0000 mg | ORAL_TABLET | Freq: Every day | ORAL | 1 refills | Status: DC

## 2020-09-25 MED ORDER — SIMVASTATIN 20 MG PO TABS: 20.0000 mg | ORAL_TABLET | Freq: Every day | ORAL | 1 refills | Status: DC

## 2020-09-25 NOTE — Progress Notes (Signed)
Date:  09/25/2020   Name:  Danielle Taylor   DOB:  Oct 19, 1945   MRN:  [id number]   Chief Complaint: Hypertension, Hyperlipidemia, Hypothyroidism, Allergic Rhinitis , Depression, and Cough  Hypertension This is a chronic problem. The current episode started more than 1 year ago. The problem has been waxing and waning since onset. The problem is controlled. Associated symptoms include shortness of breath. Pertinent negatives include no anxiety, blurred vision, chest pain, headaches, malaise/fatigue, neck pain, orthopnea, palpitations, peripheral edema, PND or sweats. There are no associated agents to hypertension. Risk factors for coronary artery disease include dyslipidemia. Past treatments include angiotensin blockers and calcium channel blockers. The current treatment provides moderate improvement. There are no compliance problems.  There is no history of angina, kidney disease, CAD/MI, CVA, heart failure, left ventricular hypertrophy, PVD or retinopathy. Identifiable causes of hypertension include a thyroid problem. There is no history of chronic renal disease, a hypertension causing med or renovascular disease.  Hyperlipidemia This is a chronic problem. The current episode started more than 1 year ago. The problem is controlled. Recent lipid tests were reviewed and are normal. She has no history of chronic renal disease, diabetes, hypothyroidism, liver disease, obesity or nephrotic syndrome. There are no known factors aggravating her hyperlipidemia. Associated symptoms include shortness of breath. Pertinent negatives include no chest pain, focal sensory loss, focal weakness, leg pain or myalgias. Current antihyperlipidemic treatment includes statins. The current treatment provides moderate improvement of lipids. There are no compliance problems.  Risk factors for coronary artery disease include dyslipidemia and hypertension.  Depression        This is a chronic problem.  The current episode  started more than 1 year ago.   The onset quality is gradual.   The problem has been gradually improving since onset.  Associated symptoms include no decreased concentration, no fatigue, no helplessness, no hopelessness, does not have insomnia, not irritable, no restlessness, no decreased interest, no appetite change, no body aches, no myalgias, no headaches, no indigestion, not sad and no suicidal ideas.  Past treatments include SSRIs - Selective serotonin reuptake inhibitors.  Compliance with treatment is good.  Previous treatment provided moderate relief.  Past medical history includes thyroid problem.     Pertinent negatives include no hypothyroidism and no anxiety. Cough This is a new problem. The current episode started in the past 7 days. The problem has been waxing and waning. The cough is non-productive. Associated symptoms include rhinorrhea and shortness of breath. Pertinent negatives include no chest pain, ear congestion, ear pain, fever, headaches, myalgias, postnasal drip, sore throat, sweats or wheezing.  Thyroid Problem Presents for follow-up visit. Symptoms include heat intolerance. Patient reports no cold intolerance, constipation, depressed mood, dry skin, fatigue, hair loss, nail problem or palpitations. The symptoms have been stable. Her past medical history is significant for hyperlipidemia. There is no history of diabetes or heart failure.  URI  This is a chronic (for allergic rhinitis) problem. The current episode started more than 1 year ago. The problem has been gradually improving. Associated symptoms include coughing, rhinorrhea and sneezing. Pertinent negatives include no chest pain, ear pain, headaches, neck pain, sore throat or wheezing.    Lab Results  Component Value Date   CREATININE 0.79 05/08/2020   BUN 23 05/08/2020   NA 139 05/08/2020   K 5.1 05/08/2020   CL 102 05/08/2020   CO2 23 05/08/2020   Lab Results  Component Value Date   CHOL 197 09/07/2019  HDL 60  09/07/2019   LDLCALC 116 (H) 09/07/2019   TRIG 119 09/07/2019   CHOLHDL 3.5 02/08/2017   Lab Results  Component Value Date   TSH 2.940 09/07/2019   Lab Results  Component Value Date   HGBA1C 6.3 (H) 03/11/2016   Lab Results  Component Value Date   WBC 7.1 06/23/2018   HGB 14.1 06/23/2018   HCT 42.2 06/23/2018   MCV 100.0 06/23/2018   PLT 293 06/23/2018   Lab Results  Component Value Date   ALT 13 02/15/2019   AST 16 02/15/2019   ALKPHOS 126 (H) 02/15/2019   BILITOT 1.0 02/15/2019     Review of Systems  Constitutional: Negative for appetite change, fatigue, fever and malaise/fatigue.  HENT: Positive for rhinorrhea and sneezing. Negative for ear pain, postnasal drip and sore throat.   Eyes: Negative for blurred vision.  Respiratory: Positive for cough and shortness of breath. Negative for wheezing.   Cardiovascular: Negative for chest pain, palpitations, orthopnea and PND.  Gastrointestinal: Negative for constipation.  Endocrine: Positive for heat intolerance. Negative for cold intolerance.  Musculoskeletal: Negative for myalgias and neck pain.  Neurological: Negative for focal weakness and headaches.  Psychiatric/Behavioral: Positive for depression. Negative for decreased concentration and suicidal ideas. The patient does not have insomnia.     Patient Active Problem List   Diagnosis Date Noted  . Anticoagulation adequate with anticoagulant therapy 10/28/2018  . Complete heart block, transient ([place]) 10/28/2018  . COPD (chronic obstructive pulmonary disease) ([place]) 10/28/2018  . Penicillin allergy 10/28/2018  . 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 Lumbar radiculopathy 06/29/2018  . Chronic low back pain with sciatica 02/15/2018  . Reactive airway disease, mild intermittent, uncomplicated 00/93/8182  . Chronic seasonal allergic rhinitis due to pollen 08/10/2017  . Mixed hyperlipidemia 08/10/2017  . Depression 08/10/2017  . Class 1 obesity due to excess  calories without serious comorbidity with body mass index (BMI) of 34.0 to 34.9 in adult 08/10/2017  . Chronic cough 06/15/2017  . Interstitial lung disease ([place]) 06/15/2017  . Centrilobular emphysema ([employer]) 04/02/2017  . Lumbar disc disease 04/02/2017  . Hepatic steatosis 04/02/2017  . Atherosclerosis of coronary artery of native heart 04/02/2017  . Cardiac pacemaker in situ 01/06/2017  . Paroxysmal A-fib ([place]) 03/30/2016  . Syncope and collapse 03/11/2016  . Right leg weakness 03/11/2016  . Essential hypertension 03/11/2016  . Hyperglycemia 03/11/2016  . 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 Reactive airway disease 03/04/2015  . Hypothyroid 12/07/2014  . Familial multiple lipoprotein-type hyperlipidemia 12/07/2014  . Acute bronchitis 12/07/2014  . Recurrent major depressive episodes ([place]) 12/07/2014  . Essential (primary) hypertension 12/07/2014  . H/O endocrine disorder 12/07/2014  . Pre-operative examination 12/07/2014  . History of depression 09/22/2012  . Fitting or adjustment of cardiac pacemaker 12/02/2011  . Chronic gastritis 07/24/1998    Allergies  Allergen Reactions  . Baclofen Other (See Comments)    Weakness, confusion, tremors.    . Penicillins Other (See Comments)    Has patient had a PCN reaction causing immediate rash, facial/tongue/throat swelling, SOB or lightheadedness with hypotension: No Has patient had a PCN reaction causing severe rash involving mucus membranes or skin necrosis: No Has patient had a PCN reaction that required hospitalization No Has patient had a PCN reaction occurring within the last 10 years: No If all of the above answers are "NO", then may proceed with Cephalosporin use.     Past Surgical History:  Procedure Laterality Date  .  APPENDECTOMY    . COLONOSCOPY  2000  . COLONOSCOPY WITH PROPOFOL N/A 04/26/2015   Procedure: COLONOSCOPY WITH PROPOFOL;  Surgeon: Hulen Luster, MD;   Location: [facility] ENDOSCOPY;  Service: Gastroenterology;  Laterality: N/A;  . fibroid tumors     fingers and wrist  . FOOT SURGERY    . TONSILLECTOMY    . TRANSFORAMINAL LUMBAR INTERBODY FUSION (TLIF) WITH PEDICLE SCREW FIXATION 1 LEVEL N/A 06/29/2018   Procedure: TRANSFORAMINAL LUMBAR INTERBODY FUSION (TLIF) WITH PEDICLE SCREW FIXATION 1 LEVEL;  Surgeon: [name], MD;  Location: [facility] ORS;  Service: Neurosurgery;  Laterality: N/A;  . VAGINAL HYSTERECTOMY      Social History   Tobacco Use  . Smoking status: Former Smoker    Packs/day: 0.50    Years: 33.00    Pack years: 16.50    Types: Cigarettes    Quit date: 2003    Years since quitting: 19.1  . Smokeless tobacco: Never Used  . Tobacco comment: smoking cessation materials not required  Vaping Use  . Vaping Use: Never used  Substance Use Topics  . Alcohol use: Not Currently    Alcohol/week: 0.0 standard drinks    Comment: occassional  . Drug use: No     Medication list has been reviewed and updated.  Current Meds  Medication Sig  . acetaminophen (TYLENOL) 500 MG tablet Take 1,000 mg by mouth every 8 (eight) hours as needed (pain).  . citalopram (CELEXA) 40 MG tablet TAKE 1 TABLET BY MOUTH EVERY DAY  . gabapentin (NEURONTIN) 300 MG capsule Take 600 mg by mouth 3 (three) times daily. [place]  . gemfibrozil (LOPID) 600 MG tablet TAKE 1 TABLET BY MOUTH EVERY DAY  . levothyroxine (SYNTHROID) 112 MCG tablet TAKE 1 TABLET BY MOUTH EVERY DAY  . loratadine (CLARITIN) 10 MG tablet TAKE 1 TABLET BY MOUTH EVERY DAY  . losartan (COZAAR) 25 MG tablet TAKE 2 TABLETS BY MOUTH EVERY DAY  . metoprolol succinate (TOPROL-XL) 50 MG 24 hr tablet TAKE 1 TABLET BY MOUTH EVERY DAY  . montelukast (SINGULAIR) 10 MG tablet TAKE 1 TABLET BY MOUTH EVERYDAY AT BEDTIME  . Multiple Vitamins-Minerals (CENTRUM SILVER 50+WOMEN PO) Take 1 tablet by mouth daily.  Marland Kitchen oxybutynin (DITROPAN-XL) 10 MG 24 hr tablet Take 1 tablet (10 mg total) by mouth at  bedtime.  . rivaroxaban (XARELTO) 20 MG TABS tablet Take 20 mg by mouth daily with supper.  . simvastatin (ZOCOR) 20 MG tablet TAKE 1 TABLET BY MOUTH EVERY DAY   Current Facility-Administered Medications for the 09/25/20 encounter (Office Visit) with Juline Patch, MD  Medication  . albuterol (PROVENTIL) (2.5 MG/3ML) 0.083% nebulizer solution 2.5 mg  . ipratropium-albuterol (DUONEB) 0.5-2.5 (3) MG/3ML nebulizer solution 3 mL    PHQ 2/9 Scores 09/25/2020 04/08/2020 09/07/2019 02/15/2019  PHQ - 2 Score 0 0 0 0  PHQ- 9 Score 0 0 0 0    GAD 7 : Generalized Anxiety Score 09/25/2020 04/08/2020 09/07/2019  Nervous, Anxious, on Edge 0 0 0  Control/stop worrying 0 0 0  Worry too much - different things 0 0 0  Trouble relaxing 0 0 0  Restless 0 0 0  Easily annoyed or irritable 0 0 0  Afraid - awful might happen 0 0 0  Total GAD 7 Score 0 0 0  Anxiety Difficulty - Not difficult at all -    BP Readings from Last 3 Encounters:  09/25/20 130/90  05/03/20 120/80  04/10/20 122/70    Physical Exam  Constitutional:      General: She is not irritable.    Wt Readings from Last 3 Encounters:  09/25/20 192 lb (87.1 kg)  05/03/20 188 lb (85.3 kg)  04/10/20 183 lb (83 kg)    BP 130/90   Pulse 76   Ht 5\' 4"  (1.626 m)   Wt 192 lb (87.1 kg)   SpO2 99%   BMI 32.96 kg/m   Assessment and Plan: 1. Essential (primary) hypertension Chronic.  Controlled.  Stable.  Blood pressure is 130/90.  We will continue metoprolol XL 50 mg once a day and losartan 25 mg once a day.  Will check CMP for electrolytes and GFR. - losartan (COZAAR) 25 MG tablet; Take 2 tablets (50 mg total) by mouth daily.  Dispense: 180 tablet; Refill: 1 - metoprolol succinate (TOPROL-XL) 50 MG 24 hr tablet; Take 1 tablet (50 mg total) by mouth daily. Take with or immediately following a meal.  Dispense: 90 tablet; Refill: 1 - Comprehensive Metabolic Panel (CMET)  2. Mixed hyperlipidemia Chronic.  Controlled.  Stable.  Continue  simvastatin 20 mg once a day.  We will also continue gemfibrozil 600 mg 1 tablet daily.  Will check lipid panel for current status of triglycerides and LDL. - gemfibrozil (LOPID) 600 MG tablet; Take 1 tablet (600 mg total) by mouth daily.  Dispense: 90 tablet; Refill: 1 - simvastatin (ZOCOR) 20 MG tablet; Take 1 tablet (20 mg total) by mouth daily.  Dispense: 90 tablet; Refill: 1 - Lipid Panel With LDL/HDL Ratio  3. Depression, unspecified depression type Chronic.  Controlled.  Stable.  PHQ is 0 and gad score is 0.  We will continue citalopram 40 mg once a day. - citalopram (CELEXA) 40 MG tablet; Take 1 tablet (40 mg total) by mouth daily.  Dispense: 90 tablet; Refill: 1  4. Familial multiple lipoprotein-type hyperlipidemia Chronic.  Controlled.  Stable.  Continue gemfibrozil per above. - gemfibrozil (LOPID) 600 MG tablet; Take 1 tablet (600 mg total) by mouth daily.  Dispense: 90 tablet; Refill: 1  5. Hypothyroidism, unspecified type Chronic.  Controlled.  Stable.  Continue levothyroxine 112 mcg daily.  Will check thyroid panel with TSH. - levothyroxine (SYNTHROID) 112 MCG tablet; Take 1 tablet (112 mcg total) by mouth daily.  Dispense: 90 tablet; Refill: 1 - Thyroid Panel With TSH  6. Chronic seasonal allergic rhinitis due to pollen Chronic.  Controlled.  Stable.  Continue loratadine and Singulair 10 mg daily. - loratadine (CLARITIN) 10 MG tablet; Take 1 tablet (10 mg total) by mouth daily.  Dispense: 90 tablet; Refill: 1 - montelukast (SINGULAIR) 10 MG tablet; Take 1 tablet daily  Dispense: 90 tablet; Refill: 1  7. Reactive airway disease, mild intermittent, uncomplicated Controlled.  Stable.  Episodic.  Mild and intermittent upon occurrence.  Will continue albuterol inhaler as needed. - montelukast (SINGULAIR) 10 MG tablet; Take 1 tablet daily  Dispense: 90 tablet; Refill: 1  8. 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  Patient is continuing to have a nonproductive cough for which we will use Tessalon Perles 100 mg twice a day as needed for cough. - benzonatate (TESSALON) 100 MG capsule; Take 1 capsule (100 mg total) by mouth 2 (two) times daily as needed for cough.  Dispense: 20 capsule; Refill: 0  9. Neurogenic bladder Patient has a history of a neurogenic bladder for which she is been followed by urology-Dr. Junious Silk in [place].  Patient has not seen a physician  in couple of years and referral has been made for reevaluation and treatment.  Patient is currently on Ditropan XL 10 mg and patient has been encouraged that we would prefer to keep this within the direction of urology. - Ambulatory referral to Urology

## 2020-09-26 LAB — COMPREHENSIVE METABOLIC PANEL
ALT: 8 IU/L (ref 0–32)
AST: 9 IU/L (ref 0–40)
Albumin/Globulin Ratio: 1.6 (ref 1.2–2.2)
Albumin: 4.1 g/dL (ref 3.7–4.7)
Alkaline Phosphatase: 105 IU/L (ref 44–121)
BUN/Creatinine Ratio: 20 (ref 12–28)
BUN: 18 mg/dL (ref 8–27)
Bilirubin Total: 0.4 mg/dL (ref 0.0–1.2)
CO2: 21 mmol/L (ref 20–29)
Calcium: 9.5 mg/dL (ref 8.7–10.3)
Chloride: 106 mmol/L (ref 96–106)
Creatinine, Ser: 0.9 mg/dL (ref 0.57–1.00)
GFR calc Af Amer: 74 mL/min/{1.73_m2} (ref >59)
GFR calc non Af Amer: 64 mL/min/{1.73_m2} (ref >59)
Globulin, Total: 2.6 g/dL (ref 1.5–4.5)
Glucose: 96 mg/dL (ref 65–99)
Potassium: 5 mmol/L (ref 3.5–5.2)
Sodium: 142 mmol/L (ref 134–144)
Total Protein: 6.7 g/dL (ref 6.0–8.5)

## 2020-09-26 LAB — LIPID PANEL WITH LDL/HDL RATIO
Cholesterol, Total: 185 mg/dL (ref 100–199)
HDL: 55 mg/dL (ref >39)
LDL Chol Calc (NIH): 110 mg/dL — ABNORMAL HIGH (ref 0–99)
LDL/HDL Ratio: 2 ratio (ref 0.0–3.2)
Triglycerides: 113 mg/dL (ref 0–149)
VLDL Cholesterol Cal: 20 mg/dL (ref 5–40)

## 2020-09-26 LAB — THYROID PANEL WITH TSH
Free Thyroxine Index: 2 (ref 1.2–4.9)
T3 Uptake Ratio: 29 % (ref 24–39)
T4, Total: 6.9 ug/dL (ref 4.5–12.0)
TSH: 0.686 u[IU]/mL (ref 0.450–4.500)

## 2020-09-27 ENCOUNTER — Telehealth: Payer: Self-pay

## 2020-09-27 ENCOUNTER — Other Ambulatory Visit: Payer: Self-pay

## 2020-09-27 DIAGNOSIS — R059 Cough, unspecified: Secondary | ICD-10-CM

## 2020-09-27 MED ORDER — GUAIFENESIN-CODEINE 100-10 MG/5ML PO SOLN: 5.0000 mL | Freq: Three times a day (TID) | ORAL | 0 refills | Status: DC | PRN

## 2020-09-27 NOTE — Telephone Encounter (Signed)
Copied from [place] [phone]. Topic: General - Other >> Sep 27, 2020  9:02 AM Leward Quan A wrote: Reason for CRM: Patient called in to inquire if Dr Ronnald Ramp can call her in something for the cough she stated that it has gotten worst and she is up all night coughing and not getting her sleep. Any questions please call Ph# [phone]

## 2020-09-27 NOTE — Telephone Encounter (Signed)
Called in Robitussin [facility]

## 2020-09-27 NOTE — Progress Notes (Unsigned)
Called in [place]

## 2020-10-14 DIAGNOSIS — Z950 Presence of cardiac pacemaker: Secondary | ICD-10-CM | POA: Diagnosis not present

## 2020-10-28 ENCOUNTER — Ambulatory Visit: Payer: Medicare Other | Admitting: Urology

## 2020-11-01 ENCOUNTER — Other Ambulatory Visit: Payer: Self-pay | Admitting: Family Medicine

## 2020-11-01 DIAGNOSIS — B379 Candidiasis, unspecified: Secondary | ICD-10-CM

## 2020-11-06 ENCOUNTER — Ambulatory Visit (INDEPENDENT_AMBULATORY_CARE_PROVIDER_SITE_OTHER): Payer: Medicare Other

## 2020-11-06 ENCOUNTER — Other Ambulatory Visit: Payer: Self-pay

## 2020-11-06 VITALS — BP 122/76 | HR 84 | Temp 97.6°F | Resp 16 | Ht 64.0 in | Wt 187.4 lb

## 2020-11-06 DIAGNOSIS — R21 Rash and other nonspecific skin eruption: Secondary | ICD-10-CM

## 2020-11-06 DIAGNOSIS — Z780 Asymptomatic menopausal state: Secondary | ICD-10-CM | POA: Diagnosis not present

## 2020-11-06 DIAGNOSIS — Z1231 Encounter for screening mammogram for malignant neoplasm of breast: Secondary | ICD-10-CM

## 2020-11-06 DIAGNOSIS — Z0000 Encounter for general adult medical examination without abnormal findings: Secondary | ICD-10-CM

## 2020-11-06 MED ORDER — NYSTATIN 100000 UNIT/GM EX CREA: 1.0000 "application " | TOPICAL_CREAM | Freq: Two times a day (BID) | CUTANEOUS | 0 refills | Status: DC

## 2020-11-06 NOTE — Progress Notes (Signed)
Subjective:   Danielle Taylor is a 75 y.o. female who presents for Medicare Annual (Subsequent) preventive examination.  Review of Systems     Cardiac Risk Factors include: advanced age (>15men, >19 women);hypertension;dyslipidemia;obesity (BMI >30kg/m2)     Objective:    Today's Vitals   11/06/20 1010  BP: 122/76  Pulse: 84  Resp: 16  Temp: 97.6 F (36.4 C)  TempSrc: Oral  Weight: 187 lb 6.4 oz (85 kg)  Height: 5\' 4"  (1.626 m)   Body mass index is 32.17 kg/m.  Advanced Directives 11/06/2020 01/25/2019 06/29/2018 06/23/2018 11/15/2017 03/11/2016 04/26/2015  Does Patient Have a Medical Advance Directive? Yes Yes Yes Yes No No No  Type of Paramedic of [place];Living will Living will;Healthcare Power of [place];Living will - - -  Does patient want to make changes to medical advance directive? - - No - Patient declined - - - -  Copy of [place] in Chart? Yes - validated most recent copy scanned in chart (See row information) Yes - validated most recent copy scanned in chart (See row information) No - copy requested No - copy requested - - -  Would patient like information on creating a medical advance directive? - - - - Yes (MAU/Ambulatory/Procedural Areas - Information given) - -    Current Medications (verified) Outpatient Encounter Medications as of 11/06/2020  Medication Sig  . acetaminophen (TYLENOL) 500 MG tablet Take 1,000 mg by mouth every 8 (eight) hours as needed (pain).  . citalopram (CELEXA) 40 MG tablet Take 1 tablet (40 mg total) by mouth daily.  Marland Kitchen gabapentin (NEURONTIN) 300 MG capsule Take 600 mg by mouth 3 (three) times daily. [place]  . gemfibrozil (LOPID) 600 MG tablet Take 1 tablet (600 mg total) by mouth daily.  Marland Kitchen levothyroxine (SYNTHROID) 112 MCG tablet Take 1 tablet (112 mcg total) by mouth daily.  Marland Kitchen loratadine (CLARITIN) 10 MG tablet Take 1 tablet (10  mg total) by mouth daily.  Marland Kitchen losartan (COZAAR) 25 MG tablet Take 2 tablets (50 mg total) by mouth daily.  . metoprolol succinate (TOPROL-XL) 50 MG 24 hr tablet Take 1 tablet (50 mg total) by mouth daily. Take with or immediately following a meal.  . montelukast (SINGULAIR) 10 MG tablet Take 1 tablet daily  . Multiple Vitamins-Minerals (CENTRUM SILVER 50+WOMEN PO) Take 1 tablet by mouth daily.  Marland Kitchen oxybutynin (DITROPAN-XL) 10 MG 24 hr tablet Take 1 tablet (10 mg total) by mouth at bedtime.  . rivaroxaban (XARELTO) 20 MG TABS tablet Take 20 mg by mouth daily with supper.  . simvastatin (ZOCOR) 20 MG tablet Take 1 tablet (20 mg total) by mouth daily.  . [DISCONTINUED] benzonatate (TESSALON) 100 MG capsule Take 1 capsule (100 mg total) by mouth 2 (two) times daily as needed for cough.  . [DISCONTINUED] guaiFENesin-codeine 100-10 MG/5ML syrup Take 5 mLs by mouth 3 (three) times daily as needed for cough.   Facility-Administered Encounter Medications as of 11/06/2020  Medication  . albuterol (PROVENTIL) (2.5 MG/3ML) 0.083% nebulizer solution 2.5 mg  . ipratropium-albuterol (DUONEB) 0.5-2.5 (3) MG/3ML nebulizer solution 3 mL    Allergies (verified) Baclofen and Penicillins   History: Past Medical History:  Diagnosis Date  . Allergy   . COPD (chronic obstructive pulmonary disease) ([place])   . Depression   . Dysrhythmia    Complete Heart Block  . Gastritis, chronic   . GERD (gastroesophageal reflux disease)   .  Graves disease   . History of hiatal hernia   . Hyperlipidemia   . Hypertension   . Hypothyroidism   . Incontinence    bladder and bowel  . Presence of permanent cardiac pacemaker    2012   Past Surgical History:  Procedure Laterality Date  . APPENDECTOMY    . COLONOSCOPY  2000  . COLONOSCOPY WITH PROPOFOL N/A 04/26/2015   Procedure: COLONOSCOPY WITH PROPOFOL;  Surgeon: Hulen Luster, MD;  Location: [facility] ENDOSCOPY;  Service: Gastroenterology;  Laterality: N/A;  . fibroid tumors      fingers and wrist  . FOOT SURGERY    . TONSILLECTOMY    . TRANSFORAMINAL LUMBAR INTERBODY FUSION (TLIF) WITH PEDICLE SCREW FIXATION 1 LEVEL N/A 06/29/2018   Procedure: TRANSFORAMINAL LUMBAR INTERBODY FUSION (TLIF) WITH PEDICLE SCREW FIXATION 1 LEVEL;  Surgeon: [name], MD;  Location: [facility] ORS;  Service: Neurosurgery;  Laterality: N/A;  . VAGINAL HYSTERECTOMY     Family History  Problem Relation Age of Onset  . Dementia Mother   . Heart disease Father   . Diabetes Sister   . Breast cancer Neg Hx    Social History   Socioeconomic History  . Marital status: Married    Spouse name: Not on file  . Number of children: 2  . Years of education: Not on file  . Highest education level: 12th grade  Occupational History  . Occupation: Retired  Tobacco Use  . Smoking status: Former Smoker    Packs/day: 0.50    Years: 33.00    Pack years: 16.50    Types: Cigarettes    Quit date: 2003    Years since quitting: 19.2  . Smokeless tobacco: Never Used  . Tobacco comment: smoking cessation materials not required  Vaping Use  . Vaping Use: Never used  Substance and Sexual Activity  . Alcohol use: Yes    Alcohol/week: 1.0 standard drink    Types: 1 Glasses of wine per week    Comment: occassional  . Drug use: No  . Sexual activity: Not Currently  Other Topics Concern  . Not on file  Social History Narrative  . Not on file   Social Determinants of Health   Financial Resource Strain: Low Risk   . Difficulty of Paying Living Expenses: Not hard at all  Food Insecurity: No Food Insecurity  . Worried About Charity fundraiser in the Last Year: Never true  . Ran Out of Food in the Last Year: Never true  Transportation Needs: No Transportation Needs  . Lack of Transportation (Medical): No  . Lack of Transportation (Non-Medical): No  Physical Activity: Sufficiently Active  . Days of Exercise per Week: 7 days  . Minutes of Exercise per Session: 30 min  Stress: No Stress Concern  Present  . Feeling of Stress : Not at all  Social Connections: Moderately Integrated  . Frequency of Communication with Friends and Family: More than three times a week  . Frequency of Social Gatherings with Friends and Family: Three times a week  . Attends Religious Services: More than 4 times per year  . Active Member of Clubs or Organizations: No  . Attends Archivist Meetings: Never  . Marital Status: Married    Tobacco Counseling Counseling given: Not Answered Comment: smoking cessation materials not required   Clinical Intake:  Pre-visit preparation completed: Yes  Pain : No/denies pain     BMI - recorded: 32.17 Nutritional Status: BMI > 30  Obese  Nutritional Risks: Nausea/ vomitting/ diarrhea (vomiting episode on 3/12) Diabetes: No  How often do you need to have someone help you when you read instructions, pamphlets, or other written materials from your doctor or pharmacy?: 1 - Never    Interpreter Needed?: No  Information entered by :: Clemetine Marker LPN   Activities of Daily Living In your present state of health, do you have any difficulty performing the following activities: 11/06/2020 04/08/2020  Hearing? N N  Comment declines hearing aids -  Vision? N N  Difficulty concentrating or making decisions? N N  Walking or climbing stairs? Y N  Dressing or bathing? N N  Doing errands, shopping? Y N  Comment does not drive Facilities manager and eating ? N -  In the past six months, have you accidently leaked urine? Y -  Comment wears pads for protection -  Do you have problems with loss of bowel control? N -  Managing your Medications? N -  Managing your Finances? N -  Housekeeping or managing your Housekeeping? N -  Some recent data might be hidden    Patient Care Team: Juline Patch, MD as PCP - General (Family Medicine) Vladimir Faster, [facility] (Pharmacist) March Rummage, MD as Consulting Physician (Cardiology)  Indicate any recent Medical  Services you may have received from other than [facility] providers in the past year (date may be approximate).     Assessment:   This is a routine wellness examination for Mariaha.  Hearing/Vision screen  Hearing Screening   125Hz  250Hz  500Hz  1000Hz  2000Hz  3000Hz  4000Hz  6000Hz  8000Hz   Right ear:           Left ear:           Comments: Pt denies hearing difficulty  Vision Screening Comments: Annual vision screenings done at [place] issues and exercise activities discussed: Current Exercise Habits: Home exercise routine, Type of exercise: Other - see comments (sit and fit machine), Time (Minutes): 30, Frequency (Times/Week): 7, Weekly Exercise (Minutes/Week): 210, Intensity: Mild, Exercise limited by: orthopedic condition(s)  Goals    . DIET - INCREASE WATER INTAKE     Recommend to drink at least 6-8 8oz glasses of water per day.    . Patient Stated     Resolve issues with bowel and bladder that occurred after lumbar surgery.     . Pharmacy Care Plan      Depression Screen [facility] 2/9 Scores 11/06/2020 09/25/2020 04/08/2020 09/07/2019 02/15/2019 01/25/2019 05/03/2018  PHQ - 2 Score 0 0 0 0 0 0 0  PHQ- 9 Score - 0 0 0 0 - 0    Fall Risk Fall Risk  11/06/2020 09/25/2020 09/07/2019 01/25/2019 11/15/2017  Falls in the past year? 1 1 1  0 No  Comment - - - - -  Number falls in past yr: 0 0 0 0 -  Comment - - - - -  Injury with Fall? 1 1 1  - -  Risk for fall due to : History of fall(s);Impaired balance/gait;Impaired mobility History of fall(s);Impaired balance/gait;Impaired mobility - Impaired balance/gait History of fall(s);Impaired vision  Risk for fall due to: Comment - - - - syncope; wears eyeglasses  Follow up Falls prevention discussed Falls evaluation completed;Falls prevention discussed Falls evaluation completed Falls prevention discussed -    FALL RISK PREVENTION PERTAINING TO THE HOME:  Any stairs in or around the home? Yes  If so, are there any without handrails? No  Home  free of  loose throw rugs in walkways, pet beds, electrical cords, etc? Yes  Adequate lighting in your home to reduce risk of falls? Yes   ASSISTIVE DEVICES UTILIZED TO PREVENT FALLS:  Life alert? No  Use of a cane, walker or w/c? Yes  Grab bars in the bathroom? Yes  Shower chair or bench in shower? No  Elevated toilet seat or a handicapped toilet? No   TIMED UP AND GO:  Was the test performed? Yes .  Length of time to ambulate 10 feet: 6 sec.   Gait steady and fast with assistive device  Cognitive Function: Normal cognitive status assessed by direct observation by this Nurse Health Advisor. No abnormalities found.       6CIT Screen 01/25/2019 11/15/2017  What Year? 0 points 0 points  What month? 0 points 0 points  What time? 0 points 0 points  Count back from 20 0 points 0 points  Months in reverse 0 points 0 points  Repeat phrase 0 points 0 points  Total Score 0 0    Immunizations Immunization History  Administered Date(s) Administered  . Fluad Quad(high Dose 65+) 05/12/2019  . Influenza Nasal 06/08/2017  . Influenza, High Dose Seasonal PF 06/11/2017, 05/03/2018, 05/25/2020  . Influenza, Seasonal, Injecte, Preservative Fre 05/28/2011, 06/21/2012  . Influenza,inj,Quad PF,6+ Mos 07/15/2015, 05/28/2016  . Influenza-Unspecified 05/24/2014, 07/15/2015, 05/28/2016  . PFIZER(Purple Top)SARS-COV-2 Vaccination 10/14/2019, 11/07/2019, 08/13/2020  . PPD Test 04/05/2017  . Pneumococcal Conjugate-13 10/22/2014  . Pneumococcal Polysaccharide-23 11/15/2017  . Tdap 06/08/2020    TDAP status: Up to date   Flu Vaccine status: Up to date  Pneumococcal vaccine status: Up to date  Covid-19 vaccine status: Completed vaccines  Qualifies for Shingles Vaccine? Yes   Zostavax completed No   Shingrix Completed?: No.    Education has been provided regarding the importance of this vaccine. Patient has been advised to call insurance company to determine out of pocket expense if they have  not yet received this vaccine. Advised may also receive vaccine at local pharmacy or Health Dept. Verbalized acceptance and understanding.  Screening Tests Health Maintenance  Topic Date Due  . Hepatitis C Screening  Never done  . MAMMOGRAM  12/03/2018  . COLONOSCOPY (Pts 45-21yrs Insurance coverage will need to be confirmed)  04/25/2025  . TETANUS/TDAP  06/08/2030  . INFLUENZA VACCINE  Completed  . DEXA SCAN  Completed  . COVID-19 Vaccine  Completed  . PNA vac Low Risk Adult  Completed  . HPV VACCINES  Aged Out    Health Maintenance  Health Maintenance Due  Topic Date Due  . Hepatitis C Screening  Never done  . MAMMOGRAM  12/03/2018     Colorectal cancer screening: Type of screening: Colonoscopy. Completed 04/26/15. Repeat every 10 years  Mammogram status: Completed 12/02/17. Repeat every year  Bone Density status: Completed 12/02/17. Results reflect: Bone density results: NORMAL. Repeat every 2 years.  Lung Cancer Screening: (Low Dose CT Chest recommended if Age 65-80 years, 30 pack-year currently smoking OR have quit w/in 15years.) does not qualify.   Additional Screening:  Hepatitis C Screening: does qualify; postponed  Vision Screening: Recommended annual ophthalmology exams for early detection of glaucoma and other disorders of the eye. Is the patient up to date with their annual eye exam?  Yes  Who is the provider or what is the name of the office in which the patient attends annual eye exams? [facility] [place] [facility] Dental Screening: Recommended annual dental exams for proper oral hygiene  Community  Resource Referral / Chronic Care Management: CRR required this visit?  No   CCM required this visit?  No      Plan:     I have personally reviewed and noted the following in the patient's chart:   . Medical and social history . Use of alcohol, tobacco or illicit drugs  . Current medications and supplements . Functional ability and status . Nutritional  status . Physical activity . Advanced directives . List of other physicians . Hospitalizations, surgeries, and ER visits in previous 12 months . Vitals . Screenings to include cognitive, depression, and falls . Referrals and appointments  In addition, I have reviewed and discussed with patient certain preventive protocols, quality metrics, and best practice recommendations. A written personalized care plan for preventive services as well as general preventive health recommendations were provided to patient.     Clemetine Marker, LPN   11/12/252   Nurse Notes: pt c/o yeast type rash under breasts with odor. Pt requests refill of nystatin cream. Pt states this is something Dr. Ronnald Ramp has treated her for before. Refill sent per [name] and pt scheduled for follow up next week with Dr. Ronnald Ramp.

## 2020-11-06 NOTE — Progress Notes (Unsigned)
Sent in nystatin cream

## 2020-11-06 NOTE — Patient Instructions (Signed)
Danielle Taylor , Thank you for taking time to come for your Medicare Wellness Visit. I appreciate your ongoing commitment to your health goals. Please review the following plan we discussed and let me know if I can assist you in the future.   Screening recommendations/referrals: Colonoscopy: done 04/26/15. Repeat in 2026 Mammogram: done 12/02/17. Please call [phone] to schedule your mammogram and bone density screening.  Bone Density: done 12/02/17 Recommended yearly ophthalmology/optometry visit for glaucoma screening and checkup Recommended yearly dental visit for hygiene and checkup  Vaccinations: Influenza vaccine: done 05/25/20 Pneumococcal vaccine: done 11/15/17 Tdap vaccine: done 06/08/20 Shingles vaccine: Shingrix discussed. Please contact your pharmacy for coverage information.  Covid-19: done 10/14/19, 10/28/19 & 08/13/20  Conditions/risks identified: Keep up the great work!  Next appointment: Follow up in one year for your annual wellness visit    Preventive Care 65 Years and Older, Female Preventive care refers to lifestyle choices and visits with your health care provider that can promote health and wellness. What does preventive care include?  A yearly physical exam. This is also called an annual well check.  Dental exams once or twice a year.  Routine eye exams. Ask your health care provider how often you should have your eyes checked.  Personal lifestyle choices, including:  Daily care of your teeth and gums.  Regular physical activity.  Eating a healthy diet.  Avoiding tobacco and drug use.  Limiting alcohol use.  Practicing safe sex.  Taking low-dose aspirin every day.  Taking vitamin and mineral supplements as recommended by your health care provider. What happens during an annual well check? The services and screenings done by your health care provider during your annual well check will depend on your age, overall health, lifestyle risk factors, and  family history of disease. Counseling  Your health care provider may ask you questions about your:  Alcohol use.  Tobacco use.  Drug use.  Emotional well-being.  Home and relationship well-being.  Sexual activity.  Eating habits.  History of falls.  Memory and ability to understand (cognition).  Work and work Statistician.  Reproductive health. Screening  You may have the following tests or measurements:  Height, weight, and BMI.  Blood pressure.  Lipid and cholesterol levels. These may be checked every 5 years, or more frequently if you are over 8 years old.  Skin check.  Lung cancer screening. You may have this screening every year starting at age 70 if you have a 30-pack-year history of smoking and currently smoke or have quit within the past 15 years.  Fecal occult blood test (FOBT) of the stool. You may have this test every year starting at age 87.  Flexible sigmoidoscopy or colonoscopy. You may have a sigmoidoscopy every 5 years or a colonoscopy every 10 years starting at age 20.  Hepatitis C blood test.  Hepatitis B blood test.  Sexually transmitted disease (STD) testing.  Diabetes screening. This is done by checking your blood sugar (glucose) after you have not eaten for a while (fasting). You may have this done every 1-3 years.  Bone density scan. This is done to screen for osteoporosis. You may have this done starting at age 11.  Mammogram. This may be done every 1-2 years. Talk to your health care provider about how often you should have regular mammograms. Talk with your health care provider about your test results, treatment options, and if necessary, the need for more tests. Vaccines  Your health care provider may recommend certain vaccines, such  as:  Influenza vaccine. This is recommended every year.  Tetanus, diphtheria, and acellular pertussis (Tdap, Td) vaccine. You may need a Td booster every 10 years.  Zoster vaccine. You may need this  after age 12.  Pneumococcal 13-valent conjugate (PCV13) vaccine. One dose is recommended after age 79.  Pneumococcal polysaccharide (PPSV23) vaccine. One dose is recommended after age 63. Talk to your health care provider about which screenings and vaccines you need and how often you need them. This information is not intended to replace advice given to you by your health care provider. Make sure you discuss any questions you have with your health care provider. Document Released: 09/06/2015 Document Revised: 04/29/2016 Document Reviewed: 06/11/2015 Elsevier Interactive Patient Education  2017 [place] Prevention in the Home Falls can cause injuries. They can happen to people of all ages. There are many things you can do to make your home safe and to help prevent falls. What can I do on the outside of my home?  Regularly fix the edges of walkways and driveways and fix any cracks.  Remove anything that might make you trip as you walk through a door, such as a raised step or threshold.  Trim any bushes or trees on the path to your home.  Use bright outdoor lighting.  Clear any walking paths of anything that might make someone trip, such as rocks or tools.  Regularly check to see if handrails are loose or broken. Make sure that both sides of any steps have handrails.  Any raised decks and porches should have guardrails on the edges.  Have any leaves, snow, or ice cleared regularly.  Use sand or salt on walking paths during winter.  Clean up any spills in your garage right away. This includes oil or grease spills. What can I do in the bathroom?  Use night lights.  Install grab bars by the toilet and in the tub and shower. Do not use towel bars as grab bars.  Use non-skid mats or decals in the tub or shower.  If you need to sit down in the shower, use a plastic, non-slip stool.  Keep the floor dry. Clean up any water that spills on the floor as soon as it  happens.  Remove soap buildup in the tub or shower regularly.  Attach bath mats securely with double-sided non-slip rug tape.  Do not have throw rugs and other things on the floor that can make you trip. What can I do in the bedroom?  Use night lights.  Make sure that you have a light by your bed that is easy to reach.  Do not use any sheets or blankets that are too big for your bed. They should not hang down onto the floor.  Have a firm chair that has side arms. You can use this for support while you get dressed.  Do not have throw rugs and other things on the floor that can make you trip. What can I do in the kitchen?  Clean up any spills right away.  Avoid walking on wet floors.  Keep items that you use a lot in easy-to-reach places.  If you need to reach something above you, use a strong step stool that has a grab bar.  Keep electrical cords out of the way.  Do not use floor polish or wax that makes floors slippery. If you must use wax, use non-skid floor wax.  Do not have throw rugs and other things on the  floor that can make you trip. What can I do with my stairs?  Do not leave any items on the stairs.  Make sure that there are handrails on both sides of the stairs and use them. Fix handrails that are broken or loose. Make sure that handrails are as long as the stairways.  Check any carpeting to make sure that it is firmly attached to the stairs. Fix any carpet that is loose or worn.  Avoid having throw rugs at the top or bottom of the stairs. If you do have throw rugs, attach them to the floor with carpet tape.  Make sure that you have a light switch at the top of the stairs and the bottom of the stairs. If you do not have them, ask someone to add them for you. What else can I do to help prevent falls?  Wear shoes that:  Do not have high heels.  Have rubber bottoms.  Are comfortable and fit you well.  Are closed at the toe. Do not wear sandals.  If you  use a stepladder:  Make sure that it is fully opened. Do not climb a closed stepladder.  Make sure that both sides of the stepladder are locked into place.  Ask someone to hold it for you, if possible.  Clearly mark and make sure that you can see:  Any grab bars or handrails.  First and last steps.  Where the edge of each step is.  Use tools that help you move around (mobility aids) if they are needed. These include:  Canes.  Walkers.  Scooters.  Crutches.  Turn on the lights when you go into a dark area. Replace any light bulbs as soon as they burn out.  Set up your furniture so you have a clear path. Avoid moving your furniture around.  If any of your floors are uneven, fix them.  If there are any pets around you, be aware of where they are.  Review your medicines with your doctor. Some medicines can make you feel dizzy. This can increase your chance of falling. Ask your doctor what other things that you can do to help prevent falls. This information is not intended to replace advice given to you by your health care provider. Make sure you discuss any questions you have with your health care provider. Document Released: 06/06/2009 Document Revised: 01/16/2016 Document Reviewed: 09/14/2014 Elsevier Interactive Patient Education  2017 [employer].

## 2020-11-11 ENCOUNTER — Other Ambulatory Visit: Payer: Self-pay

## 2020-11-11 ENCOUNTER — Encounter: Payer: Self-pay | Admitting: Family Medicine

## 2020-11-11 ENCOUNTER — Ambulatory Visit (INDEPENDENT_AMBULATORY_CARE_PROVIDER_SITE_OTHER): Payer: Medicare Other | Admitting: Family Medicine

## 2020-11-11 VITALS — BP 128/80 | HR 60 | Ht 64.0 in | Wt 190.0 lb

## 2020-11-11 DIAGNOSIS — R21 Rash and other nonspecific skin eruption: Secondary | ICD-10-CM

## 2020-11-11 DIAGNOSIS — B3789 Other sites of candidiasis: Secondary | ICD-10-CM

## 2020-11-11 MED ORDER — NYSTATIN 100000 UNIT/GM EX CREA
1.0000 | TOPICAL_CREAM | Freq: Two times a day (BID) | CUTANEOUS | 11 refills | Status: DC
Start: 2020-11-11 — End: 2022-03-16

## 2020-11-11 NOTE — Progress Notes (Signed)
Date:  11/11/2020   Name:  Danielle Taylor   DOB:  [birth year]   MRN:  [id number]   Chief Complaint: Rash (It looks better as long long as using cream- when stops it turns red and has a bad odor to it. )  Rash This is a recurrent problem. The current episode started more than 1 year ago. The problem has been waxing and waning since onset. The affected locations include the chest. The rash is characterized by redness. Pertinent negatives include no congestion, cough, diarrhea, eye pain, fatigue, fever, rhinorrhea, shortness of breath, sore throat or vomiting. Treatments tried: nystatin. The treatment provided moderate relief.    Lab Results  Component Value Date   CREATININE 0.90 09/25/2020   BUN 18 09/25/2020   NA 142 09/25/2020   K 5.0 09/25/2020   CL 106 09/25/2020   CO2 21 09/25/2020   Lab Results  Component Value Date   CHOL 185 09/25/2020   HDL 55 09/25/2020   LDLCALC 110 (H) 09/25/2020   TRIG 113 09/25/2020   CHOLHDL 3.5 02/08/2017   Lab Results  Component Value Date   TSH 0.686 09/25/2020   Lab Results  Component Value Date   HGBA1C 6.3 (H) 03/11/2016   Lab Results  Component Value Date   WBC 7.1 06/23/2018   HGB 14.1 06/23/2018   HCT 42.2 06/23/2018   MCV 100.0 06/23/2018   PLT 293 06/23/2018   Lab Results  Component Value Date   ALT 8 09/25/2020   AST 9 09/25/2020   ALKPHOS 105 09/25/2020   BILITOT 0.4 09/25/2020     Review of Systems  Constitutional: Negative.  Negative for chills, fatigue, fever and unexpected weight change.  HENT: Negative for congestion, ear discharge, ear pain, rhinorrhea, sinus pressure, sneezing and sore throat.   Eyes: Negative for photophobia, pain, discharge, redness and itching.  Respiratory: Negative for cough, shortness of breath, wheezing and stridor.   Gastrointestinal: Negative for abdominal pain, blood in stool, constipation, diarrhea, nausea and vomiting.  Endocrine: Negative for cold intolerance, heat  intolerance, polydipsia, polyphagia and polyuria.  Genitourinary: Negative for dysuria, flank pain, frequency, hematuria, menstrual problem, pelvic pain, urgency, vaginal bleeding and vaginal discharge.  Musculoskeletal: Negative for arthralgias, back pain and myalgias.  Skin: Positive for rash.  Allergic/Immunologic: Negative for environmental allergies and food allergies.  Neurological: Negative for dizziness, weakness, light-headedness, numbness and headaches.  Hematological: Negative for adenopathy. Does not bruise/bleed easily.  Psychiatric/Behavioral: Negative for dysphoric mood. The patient is not nervous/anxious.     Patient Active Problem List   Diagnosis Date Noted  . Dupuytren's contracture 09/12/2019  . Mild left ventricular systolic dysfunction 62/83/1517  . Anticoagulation adequate with anticoagulant therapy 10/28/2018  . Complete heart block, transient ([place]) 10/28/2018  . COPD (chronic obstructive pulmonary disease) ([place]) 10/28/2018  . Penicillin allergy 10/28/2018  . 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 Lumbar radiculopathy 06/29/2018  . Chronic low back pain with sciatica 02/15/2018  . Reactive airway disease, mild intermittent, uncomplicated 61/60/7371  . Chronic seasonal allergic rhinitis due to pollen 08/10/2017  . Mixed hyperlipidemia 08/10/2017  . Depression 08/10/2017  . Class 1 obesity due to excess calories without serious comorbidity with body mass index (BMI) of 34.0 to 34.9 in adult 08/10/2017  . Chronic cough 06/15/2017  . Interstitial lung disease ([place]) 06/15/2017  . Centrilobular emphysema ([place]) 04/02/2017  . Lumbar disc disease 04/02/2017  . Hepatic steatosis 04/02/2017  . Atherosclerosis of coronary artery of native heart 04/02/2017  .  Cardiac pacemaker in situ 01/06/2017  . Paroxysmal A-fib ([place]) 03/30/2016  . Syncope and collapse 03/11/2016  . Right leg weakness 03/11/2016  . Essential hypertension 03/11/2016  . Hyperglycemia 03/11/2016  .  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 Reactive airway disease 03/04/2015  . Hypothyroid 12/07/2014  . Familial multiple lipoprotein-type hyperlipidemia 12/07/2014  . Acute bronchitis 12/07/2014  . Recurrent major depressive episodes ([place]) 12/07/2014  . Essential (primary) hypertension 12/07/2014  . H/O endocrine disorder 12/07/2014  . Pre-operative examination 12/07/2014  . History of depression 09/22/2012  . Fitting or adjustment of cardiac pacemaker 12/02/2011  . Chronic gastritis 07/24/1998    Allergies  Allergen Reactions  . Baclofen Other (See Comments)    Weakness, confusion, tremors.    . Penicillins Other (See Comments)    Has patient had a PCN reaction causing immediate rash, facial/tongue/throat swelling, SOB or lightheadedness with hypotension: No Has patient had a PCN reaction causing severe rash involving mucus membranes or skin necrosis: No Has patient had a PCN reaction that required hospitalization No Has patient had a PCN reaction occurring within the last 10 years: No If all of the above answers are "NO", then may proceed with Cephalosporin use.     Past Surgical History:  Procedure Laterality Date  . APPENDECTOMY    . COLONOSCOPY  2000  . COLONOSCOPY WITH PROPOFOL N/A 04/26/2015   Procedure: COLONOSCOPY WITH PROPOFOL;  Surgeon: Hulen Luster, MD;  Location: [facility] ENDOSCOPY;  Service: Gastroenterology;  Laterality: N/A;  . fibroid tumors     fingers and wrist  . FOOT SURGERY    . TONSILLECTOMY    . TRANSFORAMINAL LUMBAR INTERBODY FUSION (TLIF) WITH PEDICLE SCREW FIXATION 1 LEVEL N/A 06/29/2018   Procedure: TRANSFORAMINAL LUMBAR INTERBODY FUSION (TLIF) WITH PEDICLE SCREW FIXATION 1 LEVEL;  Surgeon: [name], MD;  Location: [facility] ORS;  Service: Neurosurgery;  Laterality: N/A;  . VAGINAL HYSTERECTOMY      Social History   Tobacco Use  . Smoking status: Former Smoker    Packs/day: 0.50    Years: 33.00     Pack years: 16.50    Types: Cigarettes    Quit date: 2003    Years since quitting: 19.2  . Smokeless tobacco: Never Used  . Tobacco comment: smoking cessation materials not required  Vaping Use  . Vaping Use: Never used  Substance Use Topics  . Alcohol use: Yes    Alcohol/week: 1.0 standard drink    Types: 1 Glasses of wine per week    Comment: occassional  . Drug use: No     Medication list has been reviewed and updated.  Current Meds  Medication Sig  . acetaminophen (TYLENOL) 500 MG tablet Take 1,000 mg by mouth every 8 (eight) hours as needed (pain).  . citalopram (CELEXA) 40 MG tablet Take 1 tablet (40 mg total) by mouth daily.  Marland Kitchen gabapentin (NEURONTIN) 300 MG capsule Take 600 mg by mouth 3 (three) times daily. [place]  . gemfibrozil (LOPID) 600 MG tablet Take 1 tablet (600 mg total) by mouth daily.  Marland Kitchen levothyroxine (SYNTHROID) 112 MCG tablet Take 1 tablet (112 mcg total) by mouth daily.  Marland Kitchen loratadine (CLARITIN) 10 MG tablet Take 1 tablet (10 mg total) by mouth daily.  Marland Kitchen losartan (COZAAR) 25 MG tablet Take 2 tablets (50 mg total) by mouth daily.  . metoprolol succinate (TOPROL-XL) 50 MG 24 hr tablet Take 1 tablet (50 mg total) by mouth daily. Take with or  immediately following a meal.  . montelukast (SINGULAIR) 10 MG tablet Take 1 tablet daily  . Multiple Vitamins-Minerals (CENTRUM SILVER 50+WOMEN PO) Take 1 tablet by mouth daily.  Marland Kitchen nystatin cream (MYCOSTATIN) Apply 1 application topically 2 (two) times daily.  Marland Kitchen oxybutynin (DITROPAN-XL) 10 MG 24 hr tablet Take 1 tablet (10 mg total) by mouth at bedtime.  . rivaroxaban (XARELTO) 20 MG TABS tablet Take 20 mg by mouth daily with supper.  . simvastatin (ZOCOR) 20 MG tablet Take 1 tablet (20 mg total) by mouth daily.   Current Facility-Administered Medications for the 11/11/20 encounter (Office Visit) with Juline Patch, MD  Medication  . albuterol (PROVENTIL) (2.5 MG/3ML) 0.083% nebulizer solution 2.5 mg  .  ipratropium-albuterol (DUONEB) 0.5-2.5 (3) MG/3ML nebulizer solution 3 mL    PHQ 2/9 Scores 11/06/2020 09/25/2020 04/08/2020 09/07/2019  PHQ - 2 Score 0 0 0 0  PHQ- 9 Score - 0 0 0    GAD 7 : Generalized Anxiety Score 09/25/2020 04/08/2020 09/07/2019  Nervous, Anxious, on Edge 0 0 0  Control/stop worrying 0 0 0  Worry too much - different things 0 0 0  Trouble relaxing 0 0 0  Restless 0 0 0  Easily annoyed or irritable 0 0 0  Afraid - awful might happen 0 0 0  Total GAD 7 Score 0 0 0  Anxiety Difficulty - Not difficult at all -    BP Readings from Last 3 Encounters:  11/11/20 128/80  11/06/20 122/76  09/25/20 130/90    Physical Exam Vitals reviewed.  Constitutional:      Appearance: She is well-developed.  HENT:     Head: Normocephalic.     Right Ear: External ear normal.     Left Ear: External ear normal.  Eyes:     General: Lids are everted, no foreign bodies appreciated. No scleral icterus.       Left eye: No foreign body or hordeolum.     Conjunctiva/sclera: Conjunctivae normal.     Right eye: Right conjunctiva is not injected.     Left eye: Left conjunctiva is not injected.     Pupils: Pupils are equal, round, and reactive to light.  Neck:     Thyroid: No thyromegaly.     Vascular: No JVD.     Trachea: No tracheal deviation.  Cardiovascular:     Rate and Rhythm: Normal rate and regular rhythm.     Heart sounds: Normal heart sounds. No murmur heard. No friction rub. No gallop.   Pulmonary:     Effort: Pulmonary effort is normal. No respiratory distress.     Breath sounds: Normal breath sounds. No wheezing or rales.  Abdominal:     General: Bowel sounds are normal.     Palpations: Abdomen is soft. There is no mass.     Tenderness: There is no abdominal tenderness. There is no guarding or rebound.  Musculoskeletal:        General: No tenderness. Normal range of motion.     Cervical back: Normal range of motion and neck supple.  Lymphadenopathy:     Cervical: No  cervical adenopathy.  Skin:    General: Skin is warm.     Findings: Erythema and rash present. Rash is macular.  Neurological:     Mental Status: She is alert and oriented to person, place, and time.     Cranial Nerves: No cranial nerve deficit.     Deep Tendon Reflexes: Reflexes normal.  Psychiatric:  Mood and Affect: Mood is not anxious or depressed.     Wt Readings from Last 3 Encounters:  11/11/20 190 lb (86.2 kg)  11/06/20 187 lb 6.4 oz (85 kg)  09/25/20 192 lb (87.1 kg)    BP 128/80   Pulse 60   Ht 5\' 4"  (1.626 m)   Wt 190 lb (86.2 kg)   BMI 32.61 kg/m   Assessment and Plan: 1. Candidiasis of breast Chronic.  Episodic.  Stable.  Patient hands during the heated part of the seasons a rash beneath the breasts with some has some odor associated with it.  We will refill the nystatin cream on a as needed basis per year as well area suggesting that she may use some triple antibiotic with it on a twice a week basis because of the secondary bacterial infection associated with it sometimes. - nystatin cream (MYCOSTATIN); Apply 1 application topically 2 (two) times daily.  Dispense: 30 g; Refill: 11  2. Rash As noted above. - nystatin cream (MYCOSTATIN); Apply 1 application topically 2 (two) times daily.  Dispense: 30 g; Refill: 11

## 2020-11-25 ENCOUNTER — Ambulatory Visit: Payer: Medicare Other | Admitting: Urology

## 2020-12-10 ENCOUNTER — Ambulatory Visit: Payer: Medicare Other

## 2020-12-10 ENCOUNTER — Inpatient Hospital Stay: Admission: RE | Admit: 2020-12-10 | Payer: Medicare Other | Source: Ambulatory Visit

## 2020-12-17 ENCOUNTER — Ambulatory Visit
Admission: RE | Admit: 2020-12-17 | Discharge: 2020-12-17 | Disposition: A | Discharge status: Home / Self Care | Payer: Medicare Other | Source: Ambulatory Visit | Attending: Family Medicine | Admitting: Family Medicine

## 2020-12-17 ENCOUNTER — Other Ambulatory Visit: Payer: Self-pay

## 2020-12-17 DIAGNOSIS — Z1231 Encounter for screening mammogram for malignant neoplasm of breast: Secondary | ICD-10-CM | POA: Diagnosis present

## 2020-12-17 DIAGNOSIS — Z780 Asymptomatic menopausal state: Secondary | ICD-10-CM | POA: Diagnosis not present

## 2020-12-23 DIAGNOSIS — M1712 Unilateral primary osteoarthritis, left knee: Secondary | ICD-10-CM | POA: Insufficient documentation

## 2021-01-30 DIAGNOSIS — H2512 Age-related nuclear cataract, left eye: Secondary | ICD-10-CM | POA: Diagnosis not present

## 2021-01-30 DIAGNOSIS — H2511 Age-related nuclear cataract, right eye: Secondary | ICD-10-CM | POA: Diagnosis not present

## 2021-02-13 DIAGNOSIS — I4891 Unspecified atrial fibrillation: Secondary | ICD-10-CM | POA: Diagnosis not present

## 2021-02-13 DIAGNOSIS — Z8679 Personal history of other diseases of the circulatory system: Secondary | ICD-10-CM | POA: Diagnosis not present

## 2021-02-13 DIAGNOSIS — Z950 Presence of cardiac pacemaker: Secondary | ICD-10-CM | POA: Diagnosis not present

## 2021-02-18 DIAGNOSIS — H25812 Combined forms of age-related cataract, left eye: Secondary | ICD-10-CM | POA: Diagnosis not present

## 2021-02-18 DIAGNOSIS — I10 Essential (primary) hypertension: Secondary | ICD-10-CM | POA: Diagnosis not present

## 2021-02-18 DIAGNOSIS — H52222 Regular astigmatism, left eye: Secondary | ICD-10-CM | POA: Diagnosis not present

## 2021-02-25 ENCOUNTER — Ambulatory Visit: Payer: Self-pay

## 2021-02-25 NOTE — Telephone Encounter (Signed)
Answer Assessment - Initial Assessment Questions 1. ONSET: "When did the cough begin?"      1 week ago 2. SEVERITY: "How bad is the cough today?"      Moderate 3. SPUTUM: "Describe the color of your sputum" (none, dry cough; clear, white, yellow, green)     Green 4. HEMOPTYSIS: "Are you coughing up any blood?" If so ask: "How much?" (flecks, streaks, tablespoons, etc.)     No 5. DIFFICULTY BREATHING: "Are you having difficulty breathing?" If Yes, ask: "How bad is it?" (e.g., mild, moderate, severe)    - MILD: No SOB at rest, mild SOB with walking, speaks normally in sentences, can lie down, no retractions, pulse < 100.    - MODERATE: SOB at rest, SOB with minimal exertion and prefers to sit, cannot lie down flat, speaks in phrases, mild retractions, audible wheezing, pulse 100-120.    - SEVERE: Very SOB at rest, speaks in single words, struggling to breathe, sitting hunched forward, retractions, pulse > 120      Mild 6. FEVER: "Do you have a fever?" If Yes, ask: "What is your temperature, how was it measured, and when did it start?"     No 7. CARDIAC HISTORY: "Do you have any history of heart disease?" (e.g., heart attack, congestive heart failure)      No 8. LUNG HISTORY: "Do you have any history of lung disease?"  (e.g., pulmonary embolus, asthma, emphysema)     Asthma 9. PE RISK FACTORS: "Do you have a history of blood clots?" (or: recent major surgery, recent prolonged travel, bedridden)     No 10. OTHER SYMPTOMS: "Do you have any other symptoms?" (e.g., runny nose, wheezing, chest pain)       Runny nose 11. PREGNANCY: "Is there any chance you are pregnant?" "When was your last menstrual period?"       No 12. TRAVEL: "Have you traveled out of the country in the last month?" (e.g., travel history, exposures)       No  Protocols used: Cough - Acute Productive-A-AH

## 2021-02-25 NOTE — Telephone Encounter (Signed)
Pt. Reports she has had a cough x 1 week. Productive with green mucus. Runny nose.Has mild shortness of breath with exertion. Resolves at rest. Appointment for tomorrow. Instructed to go to UC/ED for worsening of symptoms.

## 2021-02-25 NOTE — Telephone Encounter (Signed)
Noted   Pt has an appt tomorrow 02/26/2021.  KP

## 2021-02-26 ENCOUNTER — Encounter: Payer: Self-pay | Admitting: Family Medicine

## 2021-02-26 ENCOUNTER — Other Ambulatory Visit
Admission: RE | Admit: 2021-02-26 | Discharge: 2021-02-26 | Disposition: A | Discharge status: Home / Self Care | Payer: Medicare Other | Source: Home / Self Care | Attending: Family Medicine | Admitting: Family Medicine

## 2021-02-26 ENCOUNTER — Ambulatory Visit
Admission: RE | Admit: 2021-02-26 | Discharge: 2021-02-26 | Disposition: A | Discharge status: Home / Self Care | Payer: Medicare Other | Source: Ambulatory Visit | Attending: Family Medicine | Admitting: Family Medicine

## 2021-02-26 ENCOUNTER — Other Ambulatory Visit: Payer: Self-pay

## 2021-02-26 ENCOUNTER — Ambulatory Visit
Admission: RE | Admit: 2021-02-26 | Discharge: 2021-02-26 | Disposition: A | Discharge status: Home / Self Care | Payer: Medicare Other | Attending: Family Medicine | Admitting: Family Medicine

## 2021-02-26 ENCOUNTER — Ambulatory Visit (INDEPENDENT_AMBULATORY_CARE_PROVIDER_SITE_OTHER): Payer: Medicare Other | Admitting: Family Medicine

## 2021-02-26 VITALS — BP 120/70 | HR 64 | Temp 98.3°F | Ht 64.0 in | Wt 183.0 lb

## 2021-02-26 DIAGNOSIS — R0602 Shortness of breath: Secondary | ICD-10-CM

## 2021-02-26 DIAGNOSIS — R059 Cough, unspecified: Secondary | ICD-10-CM | POA: Insufficient documentation

## 2021-02-26 DIAGNOSIS — J432 Centrilobular emphysema: Secondary | ICD-10-CM

## 2021-02-26 LAB — CBC WITH DIFFERENTIAL/PLATELET
Abs Immature Granulocytes: 0.05 10*3/uL (ref 0.00–0.07)
Basophils Absolute: 0.1 10*3/uL (ref 0.0–0.1)
Basophils Relative: 1 %
Eosinophils Absolute: 0.3 10*3/uL (ref 0.0–0.5)
Eosinophils Relative: 3 %
HCT: 35.8 % — ABNORMAL LOW (ref 36.0–46.0)
Hemoglobin: 11.3 g/dL — ABNORMAL LOW (ref 12.0–15.0)
Immature Granulocytes: 1 %
Lymphocytes Relative: 17 %
Lymphs Abs: 1.8 10*3/uL (ref 0.7–4.0)
MCH: 27.2 pg (ref 26.0–34.0)
MCHC: 31.6 g/dL (ref 30.0–36.0)
MCV: 86.3 fL (ref 80.0–100.0)
Monocytes Absolute: 1 10*3/uL (ref 0.1–1.0)
Monocytes Relative: 9 %
Neutro Abs: 7.7 10*3/uL (ref 1.7–7.7)
Neutrophils Relative %: 69 %
Platelets: 387 10*3/uL (ref 150–400)
RBC: 4.15 MIL/uL (ref 3.87–5.11)
RDW: 15.5 % (ref 11.5–15.5)
WBC: 11 10*3/uL — ABNORMAL HIGH (ref 4.0–10.5)
nRBC: 0 % (ref 0.0–0.2)

## 2021-02-26 MED ORDER — GUAIFENESIN-CODEINE 100-10 MG/5ML PO SYRP: 5.0000 mL | ORAL_SOLUTION | Freq: Four times a day (QID) | ORAL | 0 refills | Status: DC | PRN

## 2021-02-26 MED ORDER — PREDNISONE 10 MG PO TABS: 10.0000 mg | ORAL_TABLET | Freq: Every day | ORAL | 0 refills | Status: DC

## 2021-02-26 MED ORDER — AZITHROMYCIN 250 MG PO TABS: ORAL_TABLET | ORAL | 0 refills | Status: AC

## 2021-02-26 MED ORDER — ALBUTEROL SULFATE (2.5 MG/3ML) 0.083% IN NEBU: 2.5000 mg | INHALATION_SOLUTION | Freq: Four times a day (QID) | RESPIRATORY_TRACT | 1 refills | Status: AC | PRN

## 2021-02-26 NOTE — Progress Notes (Signed)
Date:  02/26/2021   Name:  Danielle Taylor   DOB:  1946/03/13   MRN:  [id number]   Chief Complaint: Shortness of Breath  Shortness of Breath This is a recurrent problem. The current episode started in the past 7 days. The problem occurs every few minutes. The problem has been gradually worsening. Associated symptoms include coryza and wheezing. Pertinent negatives include no abdominal pain, chest pain, claudication, ear pain, fever, headaches, leg swelling, neck pain, rash, rhinorrhea or sore throat. The symptoms are aggravated by any activity. She has tried nothing for the symptoms.   Lab Results  Component Value Date   CREATININE 0.90 09/25/2020   BUN 18 09/25/2020   NA 142 09/25/2020   K 5.0 09/25/2020   CL 106 09/25/2020   CO2 21 09/25/2020   Lab Results  Component Value Date   CHOL 185 09/25/2020   HDL 55 09/25/2020   LDLCALC 110 (H) 09/25/2020   TRIG 113 09/25/2020   CHOLHDL 3.5 02/08/2017   Lab Results  Component Value Date   TSH 0.686 09/25/2020   Lab Results  Component Value Date   HGBA1C 6.3 (H) 03/11/2016   Lab Results  Component Value Date   WBC 7.1 06/23/2018   HGB 14.1 06/23/2018   HCT 42.2 06/23/2018   MCV 100.0 06/23/2018   PLT 293 06/23/2018   Lab Results  Component Value Date   ALT 8 09/25/2020   AST 9 09/25/2020   ALKPHOS 105 09/25/2020   BILITOT 0.4 09/25/2020     Review of Systems  Constitutional:  Negative for chills and fever.  HENT:  Negative for drooling, ear discharge, ear pain, rhinorrhea and sore throat.   Respiratory:  Positive for shortness of breath and wheezing. Negative for cough.   Cardiovascular:  Negative for chest pain, palpitations, claudication and leg swelling.  Gastrointestinal:  Negative for abdominal pain, blood in stool, constipation, diarrhea and nausea.  Endocrine: Negative for polydipsia.  Genitourinary:  Negative for dysuria, frequency, hematuria and urgency.  Musculoskeletal:  Negative for back pain,  myalgias and neck pain.  Skin:  Negative for rash.  Allergic/Immunologic: Negative for environmental allergies.  Neurological:  Negative for dizziness and headaches.  Hematological:  Does not bruise/bleed easily.  Psychiatric/Behavioral:  Negative for suicidal ideas. The patient is not nervous/anxious.    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   Lumbar radiculopathy 06/29/2018   Chronic low back pain with sciatica 02/15/2018   Reactive airway disease, mild intermittent, uncomplicated 37/16/9678   Chronic seasonal allergic rhinitis due to pollen 08/10/2017   Mixed hyperlipidemia 08/10/2017   Depression 08/10/2017   Class 1 obesity due to excess calories without serious comorbidity with body mass index (BMI) of 34.0 to 34.9 in adult 08/10/2017   Chronic cough 06/15/2017   Interstitial lung disease ([place]) 06/15/2017   Centrilobular emphysema ([place]) 04/02/2017   Lumbar disc disease 04/02/2017   Hepatic steatosis 04/02/2017   Atherosclerosis of coronary artery of native heart 04/02/2017   Cardiac pacemaker in situ 01/06/2017   Paroxysmal A-fib ([place]) 03/30/2016   Syncope and collapse 03/11/2016   Right leg weakness 03/11/2016   Essential hypertension 03/11/2016   Hyperglycemia 03/11/2016   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     Penicillins Other (See Comments)    Has patient had a PCN reaction causing immediate rash, facial/tongue/throat swelling, SOB or lightheadedness with hypotension: No Has patient had a PCN reaction causing severe rash involving mucus membranes or skin necrosis: No Has patient had a PCN reaction that required hospitalization No Has patient had a PCN reaction occurring within the last 10 years: No If all of the above answers are "NO", then may proceed with Cephalosporin use.     Past Surgical History:  Procedure Laterality Date   APPENDECTOMY     COLONOSCOPY  2000   COLONOSCOPY WITH PROPOFOL N/A 04/26/2015   Procedure: COLONOSCOPY WITH PROPOFOL;  Surgeon: Hulen Luster, MD;  Location: [facility] ENDOSCOPY;  Service: Gastroenterology;  Laterality: N/A;   fibroid tumors     fingers and wrist   FOOT SURGERY     TONSILLECTOMY     TRANSFORAMINAL LUMBAR INTERBODY FUSION (TLIF) WITH PEDICLE SCREW FIXATION 1 LEVEL N/A 06/29/2018   Procedure: TRANSFORAMINAL LUMBAR INTERBODY FUSION (TLIF) WITH PEDICLE SCREW FIXATION 1 LEVEL;  Surgeon: [name], MD;  Location: [facility] ORS;  Service: Neurosurgery;  Laterality: N/A;   VAGINAL HYSTERECTOMY      Social History   Tobacco Use   Smoking status: Former    Packs/day: 0.50    Years: 33.00    Pack years: 16.50    Types: Cigarettes    Quit date: 2003    Years since quitting: 19.5   Smokeless tobacco: Never   Tobacco comments:    smoking cessation materials not required  Vaping Use   Vaping Use: Never used  Substance Use Topics   Alcohol use: Yes    Alcohol/week: 1.0 standard drink    Types: 1 Glasses of wine per week    Comment: occassional   Drug  use: No     Medication list has been reviewed and updated.  Current Meds  Medication Sig   acetaminophen (TYLENOL) 500 MG tablet Take 1,000 mg by mouth every 8 (eight) hours as needed (pain).   citalopram (CELEXA) 40 MG tablet Take 1 tablet (40 mg total) by mouth daily.   gabapentin (NEURONTIN) 300 MG capsule Take 600 mg by mouth 3 (three) times daily. [place]   gemfibrozil (LOPID) 600 MG tablet Take 1 tablet (600 mg total) by mouth daily.   levothyroxine (SYNTHROID) 112 MCG tablet Take 1 tablet (112 mcg total) by mouth daily.   loratadine (CLARITIN) 10 MG tablet Take 1 tablet (10 mg total) by mouth daily.   losartan (COZAAR) 25 MG tablet Take 2 tablets (50 mg total) by mouth daily.   metoprolol succinate (TOPROL-XL) 50 MG 24 hr tablet Take 1 tablet (50 mg total) by mouth daily. Take with or immediately following a meal.   montelukast (SINGULAIR) 10 MG tablet Take 1 tablet daily   Multiple Vitamins-Minerals (CENTRUM SILVER 50+WOMEN PO) Take 1 tablet by mouth daily.   nystatin cream (MYCOSTATIN) Apply  1 application topically 2 (two) times daily.   nystatin cream (MYCOSTATIN) Apply 1 application topically 2 (two) times daily.   rivaroxaban (XARELTO) 20 MG TABS tablet Take 20 mg by mouth daily with supper.   simvastatin (ZOCOR) 20 MG tablet Take 1 tablet (20 mg total) by mouth daily.   [DISCONTINUED] oxybutynin (DITROPAN-XL) 10 MG 24 hr tablet Take 1 tablet (10 mg total) by mouth at bedtime.   Current Facility-Administered Medications for the 02/26/21 encounter (Office Visit) with Juline Patch, MD  Medication   albuterol (PROVENTIL) (2.5 MG/3ML) 0.083% nebulizer solution 2.5 mg   ipratropium-albuterol (DUONEB) 0.5-2.5 (3) MG/3ML nebulizer solution 3 mL    PHQ 2/9 Scores 11/06/2020 09/25/2020 04/08/2020 09/07/2019  PHQ - 2 Score 0 0 0 0  PHQ- 9 Score - 0 0 0    GAD 7 : Generalized Anxiety Score 09/25/2020 04/08/2020 09/07/2019  Nervous, Anxious, on Edge 0 0 0  Control/stop worrying 0 0 0   Worry too much - different things 0 0 0  Trouble relaxing 0 0 0  Restless 0 0 0  Easily annoyed or irritable 0 0 0  Afraid - awful might happen 0 0 0  Total GAD 7 Score 0 0 0  Anxiety Difficulty - Not difficult at all -    BP Readings from Last 3 Encounters:  02/26/21 120/70  11/11/20 128/80  11/06/20 122/76    Physical Exam Vitals and nursing note reviewed.  Constitutional:      General: She is not in acute distress.    Appearance: She is not diaphoretic.  HENT:     Head: Normocephalic and atraumatic.     Right Ear: External ear normal.     Left Ear: External ear normal.     Nose: Nose normal.  Eyes:     General:        Right eye: No discharge.        Left eye: No discharge.     Conjunctiva/sclera: Conjunctivae normal.     Pupils: Pupils are equal, round, and reactive to light.  Neck:     Thyroid: No thyromegaly.     Vascular: No JVD.  Cardiovascular:     Rate and Rhythm: Normal rate and regular rhythm.     Heart sounds: Normal heart sounds. No murmur heard.   No friction rub. No gallop.  Pulmonary:     Effort: Pulmonary effort is normal.     Breath sounds: Normal breath sounds. No decreased breath sounds, wheezing, rhonchi or rales.     Comments: Increased e/i Abdominal:     General: Bowel sounds are normal.     Palpations: Abdomen is soft. There is no mass.     Tenderness: There is no abdominal tenderness. There is no guarding.  Musculoskeletal:        General: Normal range of motion.     Cervical back: Normal range of motion and neck supple.  Lymphadenopathy:     Cervical: No cervical adenopathy.  Skin:    General: Skin is warm and dry.  Neurological:     Mental Status: She is alert.     Deep Tendon Reflexes: Reflexes are normal and symmetric.    Wt Readings from Last 3 Encounters:  02/26/21 183 lb (83 kg)  11/11/20 190 lb (86.2 kg)  11/06/20 187 lb 6.4 oz (85 kg)    BP 120/70   Pulse 64   Temp 98.3 F (36.8 C) (Oral)   Ht 5\' 4"  (1.626 m)  Wt 183 lb (83 kg)   SpO2 93%   BMI 31.41 kg/m   Assessment and Plan:  1. Cough New onset about a week.  Persistent.  Complicated.  Patient has baseline centrilobular emphysema and has had shortness of breath and symptoms for about a week.  Patient's been out of her albuterol and has not been taking.  We will resume albuterol inhaler every 6 hours we will initiate a azithromycin to 50 mg 2 today followed by 1 a day for 4 days.  We will obtain a chest x-ray.  And we will symptomatically treat cough. - albuterol (PROVENTIL) (2.5 MG/3ML) 0.083% nebulizer solution; Take 3 mLs (2.5 mg total) by nebulization every 6 (six) hours as needed for wheezing or shortness of breath.  Dispense: 150 mL; Refill: 1 - azithromycin (ZITHROMAX) 250 MG tablet; Take 2 tablets on day 1, then 1 tablet daily on days 2 through 5  Dispense: 6 tablet; Refill: 0 - DG Chest 2 View; Future - guaiFENesin-codeine (ROBITUSSIN AC) 100-10 MG/5ML syrup; Take 5 mLs by mouth 4 (four) times daily as needed for cough.  Dispense: 118 mL; Refill: 0  2. Shortness of breath Patient has a history of centrilobular emphysema and is followed by pulmonary.  We will obtain a chest x-ray current status and rule out community-acquired pneumonia.  Patient is tentatively scheduled to be rechecked on Friday. - albuterol (PROVENTIL) (2.5 MG/3ML) 0.083% nebulizer solution; Take 3 mLs (2.5 mg total) by nebulization every 6 (six) hours as needed for wheezing or shortness of breath.  Dispense: 150 mL; Refill: 1 - azithromycin (ZITHROMAX) 250 MG tablet; Take 2 tablets on day 1, then 1 tablet daily on days 2 through 5  Dispense: 6 tablet; Refill: 0 - DG Chest 2 View; Future  3. Centrilobular emphysema (HCC) Chronic.  Uncontrolled.  Complicated.  Patient is followed by pulmonary at [facility] clinic but has not been on inhalers for months.  We will resume these medications and will recheck on patient in 2 days. - albuterol (PROVENTIL) (2.5 MG/3ML) 0.083%  nebulizer solution; Take 3 mLs (2.5 mg total) by nebulization every 6 (six) hours as needed for wheezing or shortness of breath.  Dispense: 150 mL; Refill: 1 - predniSONE (DELTASONE) 10 MG tablet; Take 1 tablet (10 mg total) by mouth daily with breakfast.  Dispense: 30 tablet; Refill: 0

## 2021-02-28 ENCOUNTER — Encounter: Payer: Self-pay | Admitting: Family Medicine

## 2021-02-28 ENCOUNTER — Other Ambulatory Visit: Payer: Self-pay

## 2021-02-28 ENCOUNTER — Ambulatory Visit (INDEPENDENT_AMBULATORY_CARE_PROVIDER_SITE_OTHER): Payer: Medicare Other | Admitting: Family Medicine

## 2021-02-28 VITALS — BP 138/98 | HR 88 | Ht 64.0 in | Wt 183.0 lb

## 2021-02-28 DIAGNOSIS — J849 Interstitial pulmonary disease, unspecified: Secondary | ICD-10-CM | POA: Diagnosis not present

## 2021-02-28 DIAGNOSIS — J432 Centrilobular emphysema: Secondary | ICD-10-CM

## 2021-02-28 NOTE — Progress Notes (Signed)
Date:  02/28/2021   Name:  Danielle Taylor   DOB:  07/06/1946   MRN:  [id number]   Chief Complaint: Follow-up  Shortness of Breath This is a recurrent problem. The current episode started 1 to 4 weeks ago. The problem occurs every few minutes. The problem has been waxing and waning. Associated symptoms include sputum production. Pertinent negatives include no abdominal pain, chest pain, ear pain, fever, headaches, leg swelling, neck pain, orthopnea, PND, rash, rhinorrhea, sore throat or wheezing. She has tried beta agonist inhalers, steroid inhalers and oral steroids for the symptoms.   Lab Results  Component Value Date   CREATININE 0.90 09/25/2020   BUN 18 09/25/2020   NA 142 09/25/2020   K 5.0 09/25/2020   CL 106 09/25/2020   CO2 21 09/25/2020   Lab Results  Component Value Date   CHOL 185 09/25/2020   HDL 55 09/25/2020   LDLCALC 110 (H) 09/25/2020   TRIG 113 09/25/2020   CHOLHDL 3.5 02/08/2017   Lab Results  Component Value Date   TSH 0.686 09/25/2020   Lab Results  Component Value Date   HGBA1C 6.3 (H) 03/11/2016   Lab Results  Component Value Date   WBC 11.0 (H) 02/26/2021   HGB 11.3 (L) 02/26/2021   HCT 35.8 (L) 02/26/2021   MCV 86.3 02/26/2021   PLT 387 02/26/2021   Lab Results  Component Value Date   ALT 8 09/25/2020   AST 9 09/25/2020   ALKPHOS 105 09/25/2020   BILITOT 0.4 09/25/2020     Review of Systems  Constitutional:  Negative for chills and fever.  HENT:  Negative for ear discharge, ear pain, rhinorrhea and sore throat.   Respiratory:  Positive for sputum production and shortness of breath. Negative for cough and wheezing.   Cardiovascular:  Negative for chest pain, palpitations, orthopnea, leg swelling and PND.  Gastrointestinal:  Negative for abdominal pain, blood in stool, constipation, diarrhea and nausea.  Endocrine: Negative for polydipsia.  Genitourinary:  Negative for dysuria, frequency, hematuria and urgency.   Musculoskeletal:  Negative for back pain, myalgias and neck pain.  Skin:  Negative for rash.  Allergic/Immunologic: Negative for environmental allergies.  Neurological:  Negative for dizziness and headaches.  Hematological:  Negative for adenopathy. Does not bruise/bleed easily.  Psychiatric/Behavioral:  Negative for suicidal ideas. The patient is not nervous/anxious.    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   Lumbar radiculopathy 06/29/2018   Chronic low back pain with sciatica 02/15/2018   Reactive airway disease, mild intermittent, uncomplicated 16/02/3709   Chronic seasonal allergic rhinitis due to pollen 08/10/2017   Mixed hyperlipidemia 08/10/2017   Depression 08/10/2017   Class 1 obesity due to excess calories without serious comorbidity with body mass index (BMI) of 34.0 to 34.9 in adult 08/10/2017   Chronic cough 06/15/2017   Interstitial lung disease ([place]) 06/15/2017   Centrilobular emphysema ([place]) 04/02/2017   Lumbar disc disease 04/02/2017   Hepatic steatosis 04/02/2017   Atherosclerosis of coronary artery of native heart 04/02/2017   Cardiac pacemaker in situ 01/06/2017   Paroxysmal A-fib ([place]) 03/30/2016   Syncope and collapse 03/11/2016   Right leg weakness 03/11/2016   Essential hypertension  03/11/2016   Hyperglycemia 03/11/2016   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     Penicillins Other (See Comments)    Has patient had a PCN reaction causing immediate rash, facial/tongue/throat swelling, SOB or lightheadedness with hypotension: No Has patient had a PCN reaction causing severe rash involving mucus membranes or skin necrosis: No Has patient had a PCN reaction that required hospitalization No Has patient had a PCN reaction occurring within the last 10 years: No If all of the above answers are "NO", then may proceed with Cephalosporin use.     Past Surgical History:  Procedure Laterality Date   APPENDECTOMY     COLONOSCOPY  2000   COLONOSCOPY WITH PROPOFOL N/A 04/26/2015   Procedure: COLONOSCOPY WITH PROPOFOL;  Surgeon: Hulen Luster, MD;  Location: [facility] ENDOSCOPY;  Service: Gastroenterology;  Laterality: N/A;   fibroid tumors     fingers and wrist   FOOT SURGERY     TONSILLECTOMY     TRANSFORAMINAL LUMBAR INTERBODY FUSION (TLIF) WITH PEDICLE SCREW FIXATION 1 LEVEL N/A 06/29/2018   Procedure: TRANSFORAMINAL LUMBAR INTERBODY FUSION (TLIF) WITH PEDICLE SCREW FIXATION 1 LEVEL;  Surgeon: [name], MD;  Location: [facility] ORS;  Service: Neurosurgery;  Laterality: N/A;   VAGINAL HYSTERECTOMY      Social History   Tobacco Use   Smoking status: Former    Packs/day: 0.50    Years: 33.00    Pack years: 16.50    Types: Cigarettes    Quit date: 2003    Years since quitting: 19.5   Smokeless tobacco: Never   Tobacco comments:    smoking cessation materials not required  Vaping Use   Vaping Use: Never used  Substance Use Topics   Alcohol use: Yes    Alcohol/week: 1.0 standard drink     Types: 1 Glasses of wine per week    Comment: occassional   Drug use: No     Medication list has been reviewed and updated.  Current Meds  Medication Sig   acetaminophen (TYLENOL) 500 MG tablet Take 1,000 mg by mouth every 8 (eight) hours as needed (pain).   albuterol (PROVENTIL) (2.5 MG/3ML) 0.083% nebulizer solution Take 3 mLs (2.5 mg total) by nebulization every 6 (six) hours as needed for wheezing or shortness of breath.   azithromycin (ZITHROMAX) 250 MG tablet Take 2 tablets on day 1, then 1 tablet daily on days 2 through 5   citalopram (CELEXA) 40 MG tablet Take 1 tablet (40 mg total) by mouth daily.   gabapentin (NEURONTIN) 300 MG capsule Take 600 mg by mouth 3 (three) times daily. [place]   gemfibrozil (LOPID) 600 MG tablet Take 1 tablet (600 mg total) by mouth daily.   guaiFENesin-codeine (ROBITUSSIN AC) 100-10 MG/5ML syrup Take 5 mLs by mouth 4 (four) times daily as needed for cough.   levothyroxine (SYNTHROID) 112 MCG tablet Take 1 tablet (112 mcg total) by mouth daily.   loratadine (CLARITIN) 10 MG tablet Take  1 tablet (10 mg total) by mouth daily.   losartan (COZAAR) 25 MG tablet Take 2 tablets (50 mg total) by mouth daily.   metoprolol succinate (TOPROL-XL) 50 MG 24 hr tablet Take 1 tablet (50 mg total) by mouth daily. Take with or immediately following a meal.   montelukast (SINGULAIR) 10 MG tablet Take 1 tablet daily   Multiple Vitamins-Minerals (CENTRUM SILVER 50+WOMEN PO) Take 1 tablet by mouth daily.   nystatin cream (MYCOSTATIN) Apply 1 application topically 2 (two) times daily.   nystatin cream (MYCOSTATIN) Apply 1 application topically 2 (two) times daily.   predniSONE (DELTASONE) 10 MG tablet Take 1 tablet (10 mg total) by mouth daily with breakfast.   rivaroxaban (XARELTO) 20 MG TABS tablet Take 20 mg by mouth daily with supper.   simvastatin (ZOCOR) 20 MG tablet Take 1 tablet (20 mg total) by mouth daily.   Current Facility-Administered Medications for the  02/28/21 encounter (Office Visit) with Juline Patch, MD  Medication   albuterol (PROVENTIL) (2.5 MG/3ML) 0.083% nebulizer solution 2.5 mg   ipratropium-albuterol (DUONEB) 0.5-2.5 (3) MG/3ML nebulizer solution 3 mL    PHQ 2/9 Scores 11/06/2020 09/25/2020 04/08/2020 09/07/2019  PHQ - 2 Score 0 0 0 0  PHQ- 9 Score - 0 0 0    GAD 7 : Generalized Anxiety Score 09/25/2020 04/08/2020 09/07/2019  Nervous, Anxious, on Edge 0 0 0  Control/stop worrying 0 0 0  Worry too much - different things 0 0 0  Trouble relaxing 0 0 0  Restless 0 0 0  Easily annoyed or irritable 0 0 0  Afraid - awful might happen 0 0 0  Total GAD 7 Score 0 0 0  Anxiety Difficulty - Not difficult at all -    BP Readings from Last 3 Encounters:  02/28/21 (!) 138/98  02/26/21 120/70  11/11/20 128/80    Physical Exam Vitals and nursing note reviewed.  Constitutional:      General: She is not in acute distress.    Appearance: She is not diaphoretic.  HENT:     Head: Normocephalic and atraumatic.     Right Ear: Tympanic membrane, ear canal and external ear normal.     Left Ear: Tympanic membrane, ear canal and external ear normal.     Nose: Nose normal. No congestion or rhinorrhea.  Eyes:     General:        Right eye: No discharge.        Left eye: No discharge.     Conjunctiva/sclera: Conjunctivae normal.     Pupils: Pupils are equal, round, and reactive to light.  Neck:     Thyroid: No thyromegaly.     Vascular: No JVD.  Cardiovascular:     Rate and Rhythm: Normal rate and regular rhythm.     Heart sounds: Normal heart sounds. No murmur heard.   No friction rub. No gallop.  Pulmonary:     Effort: Pulmonary effort is normal.     Breath sounds: Rales present. No wheezing or rhonchi.     Comments: Bilateral Basilar crackles Abdominal:     General: Bowel sounds are normal.     Palpations: Abdomen is soft. There is no mass.     Tenderness: There is no abdominal tenderness. There is no guarding.  Musculoskeletal:         General: Normal range of motion.     Cervical back: Normal range of motion and neck supple.  Lymphadenopathy:     Cervical:  No cervical adenopathy.  Skin:    General: Skin is warm and dry.  Neurological:     Mental Status: She is alert.     Deep Tendon Reflexes: Reflexes are normal and symmetric.    Wt Readings from Last 3 Encounters:  02/28/21 183 lb (83 kg)  02/26/21 183 lb (83 kg)  11/11/20 190 lb (86.2 kg)    BP (!) 138/98   Pulse 88   Ht 5\' 4"  (1.626 m)   Wt 183 lb (83 kg)   SpO2 92%   BMI 31.41 kg/m   Assessment and Plan:  1. Interstitial lung disease ([place]) Review of chart notes interstitial lung disease that I am thinking may have been noted when she used to go to pulmonary and set under the care of Dr. Raul Del.  Patient has not been on her inhalers for at least a year and we have discussed that when probably need to resume these and resume pulmonary surveillance as well.  Appointment was placed with Dr. Raul Del and will be later on this month in the meantime we will be making some changes noted below.  2. Centrilobular emphysema (HCC) Thinking that this is more of an acute exacerbation of a chronic underlying lung concern.  COPD and/or interstitial lung disease.  We are substituting DuoNeb for albuterol for the next several treatments at home as well as increasing her dosing of prednisone to 30 mg for 3 days followed by 20 mg for 3 days followed by 10 mg for 3 days and continuance may be necessary until we see pulmonary as we will see upon recheck next week by phone call.  In the meantime patient will also continue her azithromycin and it has been discussed to have a low threshold if we need to go to the hospital.

## 2021-03-03 ENCOUNTER — Ambulatory Visit (INDEPENDENT_AMBULATORY_CARE_PROVIDER_SITE_OTHER): Payer: Medicare Other

## 2021-03-03 DIAGNOSIS — R195 Other fecal abnormalities: Secondary | ICD-10-CM

## 2021-03-03 DIAGNOSIS — D649 Anemia, unspecified: Secondary | ICD-10-CM

## 2021-03-03 LAB — HEMOCCULT GUIAC POC 1CARD (OFFICE)
Card #2 Fecal Occult Blod, POC: POSITIVE
Card #3 Fecal Occult Blood, POC: NEGATIVE
Fecal Occult Blood, POC: POSITIVE — AB

## 2021-03-04 DIAGNOSIS — H52229 Regular astigmatism, unspecified eye: Secondary | ICD-10-CM | POA: Diagnosis not present

## 2021-03-04 DIAGNOSIS — H25811 Combined forms of age-related cataract, right eye: Secondary | ICD-10-CM | POA: Diagnosis not present

## 2021-03-07 ENCOUNTER — Telehealth: Payer: Self-pay

## 2021-03-07 NOTE — Telephone Encounter (Signed)
Copied from [place] [id number]. Topic: General - Other >> Mar 07, 2021 12:12 PM Alanda Slim E wrote: Reason for CRM: Pt called to speak with Tara/ she stated she called to tell [name] she still has not heard from GI office about her Colonoscopy / please advise

## 2021-03-10 ENCOUNTER — Telehealth: Payer: Self-pay

## 2021-03-10 NOTE — Telephone Encounter (Signed)
Called with GI appt for [facility] Sept 22nd @ 10:30

## 2021-03-11 ENCOUNTER — Other Ambulatory Visit: Payer: Self-pay | Admitting: Family Medicine

## 2021-03-11 NOTE — Telephone Encounter (Signed)
Requested medications are due for refill today.  yes  Requested medications are on the active medications list.  yes  Last refill. na  Future visit scheduled.   yes  Notes to clinic.  Patient is requesting alternate medication as this one is not covered by her insurance.

## 2021-03-11 NOTE — Telephone Encounter (Signed)
ifferent provider  Review for refill   Requested Prescriptions  Pending Prescriptions Disp Refills   ipratropium-albuterol (DUONEB) 0.5-2.5 (3) MG/3ML SOLN [Pharmacy Med Name: IPRAT-ALBUT 0.5-3(2.5) MG/3 ML] 180 mL     Sig: USE 1 VIAL IN NEBULIZER EVERY 6 HOURS AS NEEDED FOR SHORTNESS OF BREATH/WHEEZE      Pulmonology:  Combination Products Passed - 03/11/2021 10:37 AM      Passed - Valid encounter within last 12 months    Recent Outpatient Visits           1 week ago Interstitial lung disease ([place])   [facility] Juline Patch, MD   1 week ago Cough   [facility] Juline Patch, MD   4 months ago Candidiasis of breast   [facility] Juline Patch, MD   5 months ago Essential (primary) hypertension   [facility] Juline Patch, MD   10 months ago Exposure to Streptococcal pharyngitis   [facility] Juline Patch, MD       Future Appointments             In 2 weeks Juline Patch, MD [facility], [facility]

## 2021-03-12 ENCOUNTER — Telehealth: Payer: Self-pay

## 2021-03-12 NOTE — Telephone Encounter (Signed)
Copied from [place] [id number]. Topic: General - Other >> Mar 12, 2021  9:28 AM Leward Quan A wrote: Reason for CRM: Patient called in asking for [name] to give her a call about a refill on her medications predniSONE (DELTASONE) 10 MG tablet and  azithromycin (ZITHROMAX) 250 MG tablet say that she is doing the breathing treatment 4 times a day since she feel like whatever she had is coming back and she want to fight it before it starts again. Please call Ph# [phone]

## 2021-03-13 ENCOUNTER — Encounter: Payer: Self-pay | Admitting: Family Medicine

## 2021-03-13 ENCOUNTER — Ambulatory Visit (INDEPENDENT_AMBULATORY_CARE_PROVIDER_SITE_OTHER): Payer: Medicare Other | Admitting: Family Medicine

## 2021-03-13 ENCOUNTER — Other Ambulatory Visit: Payer: Self-pay

## 2021-03-13 VITALS — BP 130/80 | HR 84 | Ht 64.0 in | Wt 184.0 lb

## 2021-03-13 DIAGNOSIS — J432 Centrilobular emphysema: Secondary | ICD-10-CM | POA: Diagnosis not present

## 2021-03-13 DIAGNOSIS — J0100 Acute maxillary sinusitis, unspecified: Secondary | ICD-10-CM | POA: Diagnosis not present

## 2021-03-13 MED ORDER — DOXYCYCLINE HYCLATE 100 MG PO TABS
100.0000 mg | ORAL_TABLET | Freq: Two times a day (BID) | ORAL | 0 refills | Status: DC
Start: 2021-03-13 — End: 2021-03-26

## 2021-03-13 MED ORDER — GUAIFENESIN-CODEINE 100-10 MG/5ML PO SYRP: 5.0000 mL | ORAL_SOLUTION | Freq: Four times a day (QID) | ORAL | 0 refills | Status: DC | PRN

## 2021-03-13 MED ORDER — PREDNISONE 10 MG PO TABS
10.0000 mg | ORAL_TABLET | Freq: Every day | ORAL | 0 refills | Status: DC
Start: 2021-03-13 — End: 2021-09-03

## 2021-03-13 NOTE — Progress Notes (Signed)
Date:  03/13/2021   Name:  Danielle Taylor   DOB:  1945-11-02   MRN:  [id number]   Chief Complaint: Cough (Cough and congestion)  Cough This is a chronic problem. The current episode started more than 1 year ago. The problem has been gradually improving. The problem occurs every few minutes. The cough is Productive of purulent sputum. Pertinent negatives include no chest pain, chills, ear congestion, ear pain, fever, headaches, heartburn, hemoptysis, myalgias, nasal congestion, postnasal drip, rash, rhinorrhea, sore throat, shortness of breath, sweats, weight loss or wheezing. She has tried a beta-agonist inhaler and ipratropium inhaler for the symptoms. The treatment provided moderate relief. There is no history of bronchiectasis or environmental allergies.   Lab Results  Component Value Date   CREATININE 0.90 09/25/2020   BUN 18 09/25/2020   NA 142 09/25/2020   K 5.0 09/25/2020   CL 106 09/25/2020   CO2 21 09/25/2020   Lab Results  Component Value Date   CHOL 185 09/25/2020   HDL 55 09/25/2020   LDLCALC 110 (H) 09/25/2020   TRIG 113 09/25/2020   CHOLHDL 3.5 02/08/2017   Lab Results  Component Value Date   TSH 0.686 09/25/2020   Lab Results  Component Value Date   HGBA1C 6.3 (H) 03/11/2016   Lab Results  Component Value Date   WBC 11.0 (H) 02/26/2021   HGB 11.3 (L) 02/26/2021   HCT 35.8 (L) 02/26/2021   MCV 86.3 02/26/2021   PLT 387 02/26/2021   Lab Results  Component Value Date   ALT 8 09/25/2020   AST 9 09/25/2020   ALKPHOS 105 09/25/2020   BILITOT 0.4 09/25/2020     Review of Systems  Constitutional:  Negative for chills, fever and weight loss.  HENT:  Negative for drooling, ear discharge, ear pain, postnasal drip, rhinorrhea and sore throat.   Respiratory:  Positive for cough. Negative for hemoptysis, shortness of breath and wheezing.   Cardiovascular:  Negative for chest pain, palpitations and leg swelling.  Gastrointestinal:  Negative for  abdominal pain, blood in stool, constipation, diarrhea, heartburn and nausea.  Endocrine: Negative for polydipsia.  Genitourinary:  Negative for dysuria, frequency, hematuria and urgency.  Musculoskeletal:  Negative for back pain, myalgias and neck pain.  Skin:  Negative for rash.  Allergic/Immunologic: Negative for environmental allergies.  Neurological:  Negative for dizziness and headaches.  Hematological:  Does not bruise/bleed easily.  Psychiatric/Behavioral:  Negative for suicidal ideas. The patient is not nervous/anxious.    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   Lumbar radiculopathy 06/29/2018   Chronic low back pain with sciatica 02/15/2018   Reactive airway disease, mild intermittent, uncomplicated 23/30/0762   Chronic seasonal allergic rhinitis due to pollen 08/10/2017   Mixed hyperlipidemia 08/10/2017   Depression 08/10/2017   Class 1 obesity due to excess calories without serious comorbidity with body mass index (BMI) of 34.0 to 34.9 in adult 08/10/2017   Chronic cough 06/15/2017   Interstitial lung disease ([place]) 06/15/2017   Centrilobular emphysema ([place]) 04/02/2017   Lumbar disc disease 04/02/2017   Hepatic steatosis 04/02/2017   Atherosclerosis of coronary artery of native heart 04/02/2017   Cardiac pacemaker in situ 01/06/2017  Paroxysmal A-fib ([place]) 03/30/2016   Syncope and collapse 03/11/2016   Right leg weakness 03/11/2016   Essential hypertension 03/11/2016   Hyperglycemia 03/11/2016   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     Penicillins Other (See Comments)    Has patient had a PCN reaction causing immediate rash, facial/tongue/throat swelling, SOB or lightheadedness with hypotension: No Has patient had a PCN reaction causing severe rash involving mucus membranes or skin necrosis: No Has patient had a PCN reaction that required hospitalization No Has patient had a PCN reaction occurring within the last 10 years: No If all of the above answers are "NO", then may proceed with Cephalosporin use.     Past Surgical History:  Procedure Laterality Date   APPENDECTOMY     COLONOSCOPY  2000   COLONOSCOPY WITH PROPOFOL N/A 04/26/2015   Procedure: COLONOSCOPY WITH PROPOFOL;  Surgeon: Hulen Luster, MD;  Location[facility] ENDOSCOPY;  Service: Gastroenterology;  Laterality: N/A;   fibroid tumors     fingers and wrist   FOOT SURGERY     TONSILLECTOMY     TRANSFORAMINAL LUMBAR INTERBODY FUSION (TLIF) WITH PEDICLE SCREW FIXATION 1 LEVEL N/A 06/29/2018   Procedure: TRANSFORAMINAL LUMBAR INTERBODY FUSION (TLIF) WITH PEDICLE SCREW FIXATION 1 LEVEL;  Surgeon: [name], MD;  Location: [facility] ORS;  Service: Neurosurgery;  Laterality: N/A;   VAGINAL HYSTERECTOMY      Social History   Tobacco Use   Smoking status: Former    Packs/day: 0.50    Years: 33.00    Pack years: 16.50    Types: Cigarettes    Quit date: 2003    Years since quitting: 19.5   Smokeless tobacco: Never   Tobacco comments:     smoking cessation materials not required  Vaping Use   Vaping Use: Never used  Substance Use Topics   Alcohol use: Yes    Alcohol/week: 1.0 standard drink    Types: 1 Glasses of wine per week    Comment: occassional   Drug use: No     Medication list has been reviewed and updated.  Current Meds  Medication Sig   acetaminophen (TYLENOL) 500 MG tablet Take 1,000 mg by mouth every 8 (eight) hours as needed (pain).   albuterol (PROVENTIL) (2.5 MG/3ML) 0.083% nebulizer solution Take 3 mLs (2.5 mg total) by nebulization every 6 (six) hours as needed for wheezing or shortness of breath.   citalopram (CELEXA) 40 MG tablet Take 1 tablet (40 mg total) by mouth daily.   gabapentin (NEURONTIN) 300 MG capsule Take 600 mg by mouth 3 (three) times daily. [place]   gemfibrozil (LOPID) 600 MG tablet Take 1 tablet (600 mg total) by mouth daily.   ipratropium (ATROVENT) 0.02 % nebulizer solution Take 1.25 mLs (0.25 mg total) by nebulization every 6 (six) hours as needed for wheezing or shortness of breath.   levothyroxine (SYNTHROID) 112 MCG tablet Take 1 tablet (112 mcg total) by mouth daily.   loratadine (  CLARITIN) 10 MG tablet Take 1 tablet (10 mg total) by mouth daily.   losartan (COZAAR) 25 MG tablet Take 2 tablets (50 mg total) by mouth daily.   metoprolol succinate (TOPROL-XL) 50 MG 24 hr tablet Take 1 tablet (50 mg total) by mouth daily. Take with or immediately following a meal.   montelukast (SINGULAIR) 10 MG tablet Take 1 tablet daily   Multiple Vitamins-Minerals (CENTRUM SILVER 50+WOMEN PO) Take 1 tablet by mouth daily.   nystatin cream (MYCOSTATIN) Apply 1 application topically 2 (two) times daily.   nystatin cream (MYCOSTATIN) Apply 1 application topically 2 (two) times daily.   rivaroxaban (XARELTO) 20 MG TABS tablet Take 20 mg by mouth daily with supper.   simvastatin (ZOCOR) 20 MG tablet Take 1 tablet (20 mg total) by mouth daily.   Current Facility-Administered Medications for  the 03/13/21 encounter (Office Visit) with Juline Patch, MD  Medication   albuterol (PROVENTIL) (2.5 MG/3ML) 0.083% nebulizer solution 2.5 mg   ipratropium-albuterol (DUONEB) 0.5-2.5 (3) MG/3ML nebulizer solution 3 mL    PHQ 2/9 Scores 11/06/2020 09/25/2020 04/08/2020 09/07/2019  PHQ - 2 Score 0 0 0 0  PHQ- 9 Score - 0 0 0    GAD 7 : Generalized Anxiety Score 09/25/2020 04/08/2020 09/07/2019  Nervous, Anxious, on Edge 0 0 0  Control/stop worrying 0 0 0  Worry too much - different things 0 0 0  Trouble relaxing 0 0 0  Restless 0 0 0  Easily annoyed or irritable 0 0 0  Afraid - awful might happen 0 0 0  Total GAD 7 Score 0 0 0  Anxiety Difficulty - Not difficult at all -    BP Readings from Last 3 Encounters:  03/13/21 130/80  02/28/21 (!) 138/98  02/26/21 120/70    Physical Exam HENT:     Right Ear: Tympanic membrane, ear canal and external ear normal. There is no impacted cerumen.     Left Ear: Tympanic membrane, ear canal and external ear normal. There is no impacted cerumen.     Nose: Nose normal. No congestion or rhinorrhea.  Cardiovascular:     Heart sounds: No murmur heard.   No friction rub. No gallop.  Pulmonary:     Effort: Pulmonary effort is normal.     Breath sounds: Normal breath sounds. No decreased breath sounds, wheezing, rhonchi or rales.    Wt Readings from Last 3 Encounters:  03/13/21 184 lb (83.5 kg)  02/28/21 183 lb (83 kg)  02/26/21 183 lb (83 kg)    BP 130/80   Pulse 84   Ht 5\' 4"  (1.626 m)   Wt 184 lb (83.5 kg)   SpO2 95%   BMI 31.58 kg/m   Assessment and Plan:  1. Acute maxillary sinusitis, recurrence not specified New onset about 2 weeks ago.  Persistent despite course of azithromycin.  Will change to doxycycline 100 mg twice a day. - doxycycline (VIBRA-TABS) 100 MG tablet; Take 1 tablet (100 mg total) by mouth 2 (two) times daily.  Dispense: 20 tablet; Refill: 0  2. Centrilobular emphysema ([place]) Patient has history of centrilobular  emphysema was previously followed by pulmonology.  Patient has since been pretty much beta agonists and amitriptyline.  Currently is on DuoNeb and is doing extremely well except still has some cough and there is still some mild wheezing.  We will continue her on her 10 mg prednisone once a day and patient is going to be continuing the albuterol nebulization because I  have started Trelegy 100 mg sample trial and will recheck as needed but has an upcoming pulmonary appointment for reestablishment. - predniSONE (DELTASONE) 10 MG tablet; Take 1 tablet (10 mg total) by mouth daily with breakfast.  Dispense: 30 tablet; Refill: 0 - guaiFENesin-codeine (ROBITUSSIN AC) 100-10 MG/5ML syrup; Take 5 mLs by mouth 4 (four) times daily as needed for cough.  Dispense: 118 mL; Refill: 0

## 2021-03-18 DIAGNOSIS — E669 Obesity, unspecified: Secondary | ICD-10-CM | POA: Diagnosis not present

## 2021-03-18 DIAGNOSIS — R0600 Dyspnea, unspecified: Secondary | ICD-10-CM | POA: Diagnosis not present

## 2021-03-18 DIAGNOSIS — R059 Cough, unspecified: Secondary | ICD-10-CM | POA: Diagnosis not present

## 2021-03-26 ENCOUNTER — Encounter: Payer: Self-pay | Admitting: Family Medicine

## 2021-03-26 ENCOUNTER — Other Ambulatory Visit: Payer: Self-pay

## 2021-03-26 ENCOUNTER — Ambulatory Visit (INDEPENDENT_AMBULATORY_CARE_PROVIDER_SITE_OTHER): Payer: Medicare Other | Admitting: Family Medicine

## 2021-03-26 VITALS — BP 128/80 | HR 78 | Ht 64.0 in | Wt 183.0 lb

## 2021-03-26 DIAGNOSIS — E7849 Other hyperlipidemia: Secondary | ICD-10-CM

## 2021-03-26 DIAGNOSIS — I10 Essential (primary) hypertension: Secondary | ICD-10-CM | POA: Diagnosis not present

## 2021-03-26 DIAGNOSIS — F32A Depression, unspecified: Secondary | ICD-10-CM | POA: Diagnosis not present

## 2021-03-26 DIAGNOSIS — E782 Mixed hyperlipidemia: Secondary | ICD-10-CM | POA: Diagnosis not present

## 2021-03-26 DIAGNOSIS — J4520 Mild intermittent asthma, uncomplicated: Secondary | ICD-10-CM | POA: Diagnosis not present

## 2021-03-26 DIAGNOSIS — J301 Allergic rhinitis due to pollen: Secondary | ICD-10-CM

## 2021-03-26 DIAGNOSIS — E039 Hypothyroidism, unspecified: Secondary | ICD-10-CM | POA: Diagnosis not present

## 2021-03-26 MED ORDER — LOSARTAN POTASSIUM 25 MG PO TABS: 50.0000 mg | ORAL_TABLET | Freq: Every day | ORAL | 1 refills | Status: DC

## 2021-03-26 MED ORDER — MONTELUKAST SODIUM 10 MG PO TABS: ORAL_TABLET | ORAL | 1 refills | Status: DC

## 2021-03-26 MED ORDER — CITALOPRAM HYDROBROMIDE 40 MG PO TABS: 40.0000 mg | ORAL_TABLET | Freq: Every day | ORAL | 1 refills | Status: DC

## 2021-03-26 MED ORDER — METOPROLOL SUCCINATE ER 50 MG PO TB24: 50.0000 mg | ORAL_TABLET | Freq: Every day | ORAL | 1 refills | Status: DC

## 2021-03-26 MED ORDER — SIMVASTATIN 20 MG PO TABS: 20.0000 mg | ORAL_TABLET | Freq: Every day | ORAL | 1 refills | Status: DC

## 2021-03-26 MED ORDER — LORATADINE 10 MG PO TABS
10.0000 mg | ORAL_TABLET | Freq: Every day | ORAL | 1 refills | Status: DC
Start: 2021-03-26 — End: 2021-10-09

## 2021-03-26 MED ORDER — LEVOTHYROXINE SODIUM 112 MCG PO TABS: 112.0000 ug | ORAL_TABLET | Freq: Every day | ORAL | 1 refills | Status: DC

## 2021-03-26 MED ORDER — GEMFIBROZIL 600 MG PO TABS: 600.0000 mg | ORAL_TABLET | Freq: Every day | ORAL | 1 refills | Status: DC

## 2021-03-26 NOTE — Progress Notes (Signed)
Date:  03/26/2021   Name:  Danielle Taylor   DOB:  [birth year]   MRN:  [id number]   Chief Complaint: Depression, Hyperlipidemia, Hypertension, Hypothyroidism, and Allergic Rhinitis   Depression        This is a chronic problem.  The current episode started more than 1 year ago.   The problem occurs rarely.The problem is unchanged.  Associated symptoms include no decreased concentration, no fatigue, no helplessness, no hopelessness, does not have insomnia, not irritable, no restlessness, no decreased interest, no appetite change, no body aches, no myalgias, no headaches, no indigestion, not sad and no suicidal ideas.     The symptoms are aggravated by nothing.  Past treatments include SSRIs - Selective serotonin reuptake inhibitors.  Compliance with treatment is good.  Previous treatment provided moderate relief.  Past medical history includes thyroid problem.     Pertinent negatives include no anxiety. Hyperlipidemia This is a chronic problem. The current episode started more than 1 year ago. The problem is controlled. Recent lipid tests were reviewed and are normal. She has no history of chronic renal disease. Pertinent negatives include no chest pain, leg pain, myalgias or shortness of breath. Current antihyperlipidemic treatment includes statins. The current treatment provides moderate improvement of lipids. There are no compliance problems.  Risk factors for coronary artery disease include dyslipidemia.  Hypertension This is a chronic problem. The current episode started more than 1 year ago. The problem has been waxing and waning since onset. The problem is controlled. Pertinent negatives include no anxiety, chest pain, headaches, malaise/fatigue, neck pain, orthopnea, palpitations, peripheral edema or shortness of breath. Risk factors for coronary artery disease include dyslipidemia. Past treatments include angiotensin blockers and beta blockers. The current treatment provides moderate  improvement. There are no compliance problems.  There is no history of angina, kidney disease, CAD/MI, CVA, heart failure, left ventricular hypertrophy, PVD or retinopathy. Identifiable causes of hypertension include a thyroid problem. There is no history of chronic renal disease, a hypertension causing med or renovascular disease.  Thyroid Problem Presents for follow-up visit. Patient reports no anxiety, constipation, depressed mood, diarrhea, dry skin, fatigue, palpitations or weight gain. Her past medical history is significant for hyperlipidemia. There is no history of heart failure.   Lab Results  Component Value Date   CREATININE 0.90 09/25/2020   BUN 18 09/25/2020   NA 142 09/25/2020   K 5.0 09/25/2020   CL 106 09/25/2020   CO2 21 09/25/2020   Lab Results  Component Value Date   CHOL 185 09/25/2020   HDL 55 09/25/2020   LDLCALC 110 (H) 09/25/2020   TRIG 113 09/25/2020   CHOLHDL 3.5 02/08/2017   Lab Results  Component Value Date   TSH 0.686 09/25/2020   Lab Results  Component Value Date   HGBA1C 6.3 (H) 03/11/2016   Lab Results  Component Value Date   WBC 11.0 (H) 02/26/2021   HGB 11.3 (L) 02/26/2021   HCT 35.8 (L) 02/26/2021   MCV 86.3 02/26/2021   PLT 387 02/26/2021   Lab Results  Component Value Date   ALT 8 09/25/2020   AST 9 09/25/2020   ALKPHOS 105 09/25/2020   BILITOT 0.4 09/25/2020     Review of Systems  Constitutional:  Negative for appetite change, chills, fatigue, fever, malaise/fatigue and weight gain.  HENT:  Negative for drooling, ear discharge, ear pain and sore throat.   Respiratory:  Negative for cough, shortness of breath and wheezing.   Cardiovascular:  Negative for chest pain, palpitations, orthopnea and leg swelling.  Gastrointestinal:  Negative for abdominal pain, blood in stool, constipation, diarrhea and nausea.  Endocrine: Negative for polydipsia.  Genitourinary:  Negative for dysuria, frequency, hematuria and urgency.   Musculoskeletal:  Negative for back pain, myalgias and neck pain.  Skin:  Negative for rash.  Allergic/Immunologic: Negative for environmental allergies.  Neurological:  Negative for dizziness and headaches.  Hematological:  Does not bruise/bleed easily.  Psychiatric/Behavioral:  Positive for depression. Negative for decreased concentration and suicidal ideas. The patient is not nervous/anxious and does not have insomnia.    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   Lumbar radiculopathy 06/29/2018   Chronic low back pain with sciatica 02/15/2018   Reactive airway disease, mild intermittent, uncomplicated [id number]   Chronic seasonal allergic rhinitis due to pollen 08/10/2017   Mixed hyperlipidemia 08/10/2017   Depression 08/10/2017   Class 1 obesity due to excess calories without serious comorbidity with body mass index (BMI) of 34.0 to 34.9 in adult 08/10/2017   Chronic cough 06/15/2017   Interstitial lung disease ([place]) 06/15/2017   Centrilobular emphysema ([place]) 04/02/2017   Lumbar disc disease 04/02/2017   Hepatic steatosis 04/02/2017   Atherosclerosis of coronary artery of native heart 04/02/2017   Cardiac pacemaker in situ 01/06/2017   Paroxysmal A-fib ([place]) 03/30/2016   Syncope and collapse 03/11/2016   Right leg weakness 03/11/2016   Essential hypertension 03/11/2016   Hyperglycemia 03/11/2016   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     Penicillins Other (See Comments)    Has patient had a PCN reaction causing immediate rash, facial/tongue/throat swelling, SOB or lightheadedness with hypotension: No Has patient had a PCN reaction causing severe rash involving mucus membranes or skin necrosis: No Has patient had a PCN reaction that required hospitalization No Has patient had a PCN reaction occurring within the last 10 years: No If all of the above answers are "NO", then may proceed with Cephalosporin use.     Past Surgical History:  Procedure Laterality Date   APPENDECTOMY     COLONOSCOPY  2000   COLONOSCOPY WITH PROPOFOL N/A 04/26/2015   Procedure: COLONOSCOPY WITH PROPOFOL;  Surgeon: Hulen Luster, MD;  Location: [facility] ENDOSCOPY;  Service: Gastroenterology;  Laterality: N/A;   fibroid tumors     fingers and wrist   FOOT SURGERY     TONSILLECTOMY     TRANSFORAMINAL LUMBAR INTERBODY FUSION (TLIF) WITH PEDICLE SCREW FIXATION 1 LEVEL N/A 06/29/2018   Procedure: TRANSFORAMINAL LUMBAR INTERBODY FUSION (TLIF) WITH PEDICLE SCREW FIXATION 1 LEVEL;  Surgeon: [name], MD;  Location: [facility] ORS;  Service: Neurosurgery;  Laterality: N/A;   VAGINAL HYSTERECTOMY  Social History   Tobacco Use   Smoking status: Former    Packs/day: 0.50    Years: 33.00    Pack years: 16.50    Types: Cigarettes    Quit date: 2003    Years since quitting: 19.6   Smokeless tobacco: Never   Tobacco comments:    smoking cessation materials not required  Vaping Use   Vaping Use: Never used  Substance Use Topics    Alcohol use: Yes    Alcohol/week: 1.0 standard drink    Types: 1 Glasses of wine per week    Comment: occassional   Drug use: No     Medication list has been reviewed and updated.  Current Meds  Medication Sig   acetaminophen (TYLENOL) 500 MG tablet Take 1,000 mg by mouth every 8 (eight) hours as needed (pain).   albuterol (PROVENTIL) (2.5 MG/3ML) 0.083% nebulizer solution Take 3 mLs (2.5 mg total) by nebulization every 6 (six) hours as needed for wheezing or shortness of breath.   citalopram (CELEXA) 40 MG tablet Take 1 tablet (40 mg total) by mouth daily.   gabapentin (NEURONTIN) 300 MG capsule Take 600 mg by mouth 3 (three) times daily. [place]   gemfibrozil (LOPID) 600 MG tablet Take 1 tablet (600 mg total) by mouth daily.   guaiFENesin-codeine (ROBITUSSIN AC) 100-10 MG/5ML syrup Take 5 mLs by mouth 4 (four) times daily as needed for cough.   ipratropium (ATROVENT) 0.02 % nebulizer solution Take 1.25 mLs (0.25 mg total) by nebulization every 6 (six) hours as needed for wheezing or shortness of breath.   levothyroxine (SYNTHROID) 112 MCG tablet Take 1 tablet (112 mcg total) by mouth daily.   loratadine (CLARITIN) 10 MG tablet Take 1 tablet (10 mg total) by mouth daily.   losartan (COZAAR) 25 MG tablet Take 2 tablets (50 mg total) by mouth daily.   metoprolol succinate (TOPROL-XL) 50 MG 24 hr tablet Take 1 tablet (50 mg total) by mouth daily. Take with or immediately following a meal.   montelukast (SINGULAIR) 10 MG tablet Take 1 tablet daily   Multiple Vitamins-Minerals (CENTRUM SILVER 50+WOMEN PO) Take 1 tablet by mouth daily.   nystatin cream (MYCOSTATIN) Apply 1 application topically 2 (two) times daily.   nystatin cream (MYCOSTATIN) Apply 1 application topically 2 (two) times daily.   predniSONE (DELTASONE) 10 MG tablet Take 1 tablet (10 mg total) by mouth daily with breakfast.   rivaroxaban (XARELTO) 20 MG TABS tablet Take 20 mg by mouth daily with supper. Cardiologist [facility]    simvastatin (ZOCOR) 20 MG tablet Take 1 tablet (20 mg total) by mouth daily.   [DISCONTINUED] doxycycline (VIBRA-TABS) 100 MG tablet Take 1 tablet (100 mg total) by mouth 2 (two) times daily.   Current Facility-Administered Medications for the 03/26/21 encounter (Office Visit) with Juline Patch, MD  Medication   albuterol (PROVENTIL) (2.5 MG/3ML) 0.083% nebulizer solution 2.5 mg   ipratropium-albuterol (DUONEB) 0.5-2.5 (3) MG/3ML nebulizer solution 3 mL    PHQ 2/9 Scores 03/26/2021 11/06/2020 09/25/2020 04/08/2020  PHQ - 2 Score 0 0 0 0  PHQ- 9 Score 0 - 0 0    GAD 7 : Generalized Anxiety Score 03/26/2021 09/25/2020 04/08/2020 09/07/2019  Nervous, Anxious, on Edge 0 0 0 0  Control/stop worrying 0 0 0 0  Worry too much - different things 0 0 0 0  Trouble relaxing 0 0 0 0  Restless 0 0 0 0  Easily annoyed or irritable 0 0 0 0  Afraid -  awful might happen 0 0 0 0  Total GAD 7 Score 0 0 0 0  Anxiety Difficulty - - Not difficult at all -    BP Readings from Last 3 Encounters:  03/13/21 130/80  02/28/21 (!) 138/98  02/26/21 120/70    Physical Exam Vitals and nursing note reviewed.  Constitutional:      General: She is not irritable.She is not in acute distress.    Appearance: She is not diaphoretic.  HENT:     Head: Normocephalic and atraumatic.     Right Ear: Tympanic membrane and external ear normal.     Left Ear: Tympanic membrane and external ear normal.     Nose: Nose normal. No congestion.  Eyes:     General:        Right eye: No discharge.        Left eye: No discharge.     Conjunctiva/sclera: Conjunctivae normal.     Pupils: Pupils are equal, round, and reactive to light.  Neck:     Thyroid: No thyromegaly.     Vascular: No JVD.  Cardiovascular:     Rate and Rhythm: Normal rate and regular rhythm.     Heart sounds: Normal heart sounds. No murmur heard.   No friction rub. No gallop.  Pulmonary:     Effort: Pulmonary effort is normal.     Breath sounds: Normal breath  sounds. No wheezing or rhonchi.  Abdominal:     General: Bowel sounds are normal.     Palpations: Abdomen is soft. There is no mass.     Tenderness: There is no abdominal tenderness. There is no guarding.  Musculoskeletal:        General: Normal range of motion.     Cervical back: Normal range of motion and neck supple.  Lymphadenopathy:     Cervical: No cervical adenopathy.  Skin:    General: Skin is warm and dry.  Neurological:     Mental Status: She is alert.     Deep Tendon Reflexes: Reflexes are normal and symmetric.    Wt Readings from Last 3 Encounters:  03/26/21 183 lb (83 kg)  03/13/21 184 lb (83.5 kg)  02/28/21 183 lb (83 kg)    Ht '5\' 4"'$  (1.626 m)   Wt 183 lb (83 kg)   BMI 31.41 kg/m   Assessment and Plan:  1. Essential (primary) hypertension Chronic.  Controlled.  Stable.  Blood pressure today is 128/80.  We will continue Cozaar 25 mg tablet twice daily as well as Toprol-XL 50 mg once a day.  Will check renal function panel.  Check a renal function panel for GFR and electrolyte status. - losartan (COZAAR) 25 MG tablet; Take 2 tablets (50 mg total) by mouth daily.  Dispense: 180 tablet; Refill: 1 - metoprolol succinate (TOPROL-XL) 50 MG 24 hr tablet; Take 1 tablet (50 mg total) by mouth daily. Take with or immediately following a meal.  Dispense: 90 tablet; Refill: 1 - Renal Function Panel  2. Mixed hyperlipidemia Chronic.  Controlled.  Stable.  Continue gemfibrozil 600 mg 1 tablet a day as well as simvastatin 20 mg once a day.  Will check lipid panel for current status of LDL. - gemfibrozil (LOPID) 600 MG tablet; Take 1 tablet (600 mg total) by mouth daily.  Dispense: 90 tablet; Refill: 1 - simvastatin (ZOCOR) 20 MG tablet; Take 1 tablet (20 mg total) by mouth daily.  Dispense: 90 tablet; Refill: 1 - Lipid Panel With LDL/HDL Ratio  3. Familial multiple lipoprotein-type hyperlipidemia As noted above - gemfibrozil (LOPID) 600 MG tablet; Take 1 tablet (600 mg  total) by mouth daily.  Dispense: 90 tablet; Refill: 1  4. Depression, unspecified depression type Chronic.  Controlled.  Stable.  PHQ is 0.  We will continue citalopram 40 mg once a day. - citalopram (CELEXA) 40 MG tablet; Take 1 tablet (40 mg total) by mouth daily.  Dispense: 90 tablet; Refill: 1  5. Hypothyroidism, unspecified type Chronic.  Controlled.  Stable.  We will check TSH to see current level of control and if appropriate will continue levothyroxine 112 mcg daily. - levothyroxine (SYNTHROID) 112 MCG tablet; Take 1 tablet (112 mcg total) by mouth daily.  Dispense: 90 tablet; Refill: 1 - TSH  6. Chronic seasonal allergic rhinitis due to pollen Chronic.  Controlled.  Stable.  Continue combination of loratadine 10 mg and Singulair 10 mg once a day. - loratadine (CLARITIN) 10 MG tablet; Take 1 tablet (10 mg total) by mouth daily.  Dispense: 90 tablet; Refill: 1 - montelukast (SINGULAIR) 10 MG tablet; Take 1 tablet daily  Dispense: 90 tablet; Refill: 1  7. Reactive airway disease, mild intermittent, uncomplicated Chronic.  Followed by Dr. Raul Del.  Patient states that he is keeping her on the Trelegy inhaler as well as continuance of her prednisone as well.  We will refill her Singulair 10 mg once a day. - montelukast (SINGULAIR) 10 MG tablet; Take 1 tablet daily  Dispense: 90 tablet; Refill: 1

## 2021-03-27 LAB — LIPID PANEL WITH LDL/HDL RATIO
Cholesterol, Total: 203 mg/dL — ABNORMAL HIGH (ref 100–199)
HDL: 68 mg/dL (ref >39)
LDL Chol Calc (NIH): 115 mg/dL — ABNORMAL HIGH (ref 0–99)
LDL/HDL Ratio: 1.7 ratio (ref 0.0–3.2)
Triglycerides: 114 mg/dL (ref 0–149)
VLDL Cholesterol Cal: 20 mg/dL (ref 5–40)

## 2021-03-27 LAB — RENAL FUNCTION PANEL
Albumin: 4.5 g/dL (ref 3.7–4.7)
BUN/Creatinine Ratio: 25 (ref 12–28)
BUN: 22 mg/dL (ref 8–27)
CO2: 22 mmol/L (ref 20–29)
Calcium: 9.8 mg/dL (ref 8.7–10.3)
Chloride: 102 mmol/L (ref 96–106)
Creatinine, Ser: 0.89 mg/dL (ref 0.57–1.00)
Glucose: 99 mg/dL (ref 65–99)
Phosphorus: 2.7 mg/dL — ABNORMAL LOW (ref 3.0–4.3)
Potassium: 4.6 mmol/L (ref 3.5–5.2)
Sodium: 138 mmol/L (ref 134–144)
eGFR: 69 mL/min/{1.73_m2} (ref >59)

## 2021-03-27 LAB — TSH: TSH: 0.801 u[IU]/mL (ref 0.450–4.500)

## 2021-03-28 DIAGNOSIS — Z23 Encounter for immunization: Secondary | ICD-10-CM | POA: Diagnosis not present

## 2021-04-08 DIAGNOSIS — R0609 Other forms of dyspnea: Secondary | ICD-10-CM | POA: Diagnosis not present

## 2021-04-08 DIAGNOSIS — J310 Chronic rhinitis: Secondary | ICD-10-CM | POA: Diagnosis not present

## 2021-04-08 DIAGNOSIS — R053 Chronic cough: Secondary | ICD-10-CM | POA: Diagnosis not present

## 2021-04-08 DIAGNOSIS — J441 Chronic obstructive pulmonary disease with (acute) exacerbation: Secondary | ICD-10-CM | POA: Diagnosis not present

## 2021-04-09 DIAGNOSIS — Z9842 Cataract extraction status, left eye: Secondary | ICD-10-CM | POA: Diagnosis not present

## 2021-04-09 DIAGNOSIS — Z9841 Cataract extraction status, right eye: Secondary | ICD-10-CM | POA: Diagnosis not present

## 2021-05-06 ENCOUNTER — Other Ambulatory Visit: Payer: Self-pay

## 2021-05-06 ENCOUNTER — Ambulatory Visit (INDEPENDENT_AMBULATORY_CARE_PROVIDER_SITE_OTHER): Payer: Medicare Other

## 2021-05-06 DIAGNOSIS — Z23 Encounter for immunization: Secondary | ICD-10-CM

## 2021-05-08 DIAGNOSIS — J849 Interstitial pulmonary disease, unspecified: Secondary | ICD-10-CM | POA: Diagnosis not present

## 2021-05-08 DIAGNOSIS — E669 Obesity, unspecified: Secondary | ICD-10-CM | POA: Diagnosis not present

## 2021-05-08 DIAGNOSIS — J432 Centrilobular emphysema: Secondary | ICD-10-CM | POA: Diagnosis not present

## 2021-05-19 DIAGNOSIS — Z7901 Long term (current) use of anticoagulants: Secondary | ICD-10-CM | POA: Diagnosis not present

## 2021-05-19 DIAGNOSIS — R195 Other fecal abnormalities: Secondary | ICD-10-CM | POA: Diagnosis not present

## 2021-05-19 DIAGNOSIS — K625 Hemorrhage of anus and rectum: Secondary | ICD-10-CM | POA: Diagnosis not present

## 2021-05-19 DIAGNOSIS — K5909 Other constipation: Secondary | ICD-10-CM | POA: Diagnosis not present

## 2021-05-19 DIAGNOSIS — Z950 Presence of cardiac pacemaker: Secondary | ICD-10-CM | POA: Diagnosis not present

## 2021-07-11 ENCOUNTER — Encounter: Payer: Self-pay | Admitting: *Deleted

## 2021-07-13 NOTE — H&P (Signed)
Pre-Procedure H&P   Patient ID: Danielle Taylor is a 75 y.o. female.  Gastroenterology Provider: [name], DO  Referring Provider: Octavia Bruckner, PA PCP: Juline Patch, MD  Date: 07/13/2021  HPI Ms. Danielle Taylor is a 75 y.o. female who presents today for Colonoscopy for positive FOBT, intermittent rectal bleeding.  Intermittent bleeding with wiping and not in the stool. Chronically deals with  constipation for which she uses ducolax. 2-3 BM per week. Hgb 11.3 mcv 86; no iron deficiency  Currently on xarelto held for procedure- last dose last Tuesday. Risk stratifying by cardiology reviewed as low risk.   H/o back surgery which caused some paresthesias in her lower extremities.  Colonoscopy 2016- diverticulosis, IH, fair prep. S/p hysterectomy  H/o ILD, COPD, afib, biv ppm  No fhx colon polyps or crc.  Past Medical History:  Diagnosis Date   Allergy    Chronic gastritis    Chronic low back pain with sciatica    COPD (chronic obstructive pulmonary disease) (HCC)    Depression    Diverticulosis    Dysrhythmia    Complete Heart Block   Gastritis, chronic    GERD (gastroesophageal reflux disease)    Graves disease    H/O hepatitis    Hallux valgus of right foot    Hammer toe    History of hiatal hernia    History of shingles    Hyperlipidemia    Hypertension    Hypothyroidism    Incontinence    bladder and bowel   Paroxysmal atrial fibrillation (HCC)    Presence of permanent cardiac pacemaker    2012   Stroke [facility])     Past Surgical History:  Procedure Laterality Date   APPENDECTOMY     COLONOSCOPY  08/24/1998   COLONOSCOPY WITH PROPOFOL N/A 04/26/2015   Procedure: COLONOSCOPY WITH PROPOFOL;  Surgeon: Hulen Luster, MD;  Location: [facility] ENDOSCOPY;  Service: Gastroenterology;  Laterality: N/A;   EYE SURGERY     fibroid tumors     fingers and wrist   FOOT SURGERY     TONSILLECTOMY     TRANSFORAMINAL LUMBAR INTERBODY FUSION  (TLIF) WITH PEDICLE SCREW FIXATION 1 LEVEL N/A 06/29/2018   Procedure: TRANSFORAMINAL LUMBAR INTERBODY FUSION (TLIF) WITH PEDICLE SCREW FIXATION 1 LEVEL;  Surgeon: [name], MD;  Location: [facility] ORS;  Service: Neurosurgery;  Laterality: N/A;   VAGINAL HYSTERECTOMY      Family History No h/o GI disease or malignancy  Review of Systems  Constitutional:  Negative for activity change, appetite change, fatigue, fever and unexpected weight change.  HENT:  Negative for trouble swallowing and voice change.   Respiratory:  Negative for choking, shortness of breath and wheezing.   Cardiovascular:  Negative for chest pain, palpitations and leg swelling.  Gastrointestinal:  Positive for blood in stool and constipation. Negative for abdominal distention, abdominal pain, anal bleeding, diarrhea, nausea, rectal pain and vomiting.  Musculoskeletal:  Negative for arthralgias and myalgias.  Skin:  Negative for color change and pallor.  Neurological:  Negative for dizziness, syncope and weakness.  Psychiatric/Behavioral:  Negative for confusion.   All other systems reviewed and are negative.   Medications Current Facility-Administered Medications on File Prior to Encounter  Medication Dose Route Frequency Provider Last Rate Last Admin   albuterol (PROVENTIL) (2.5 MG/3ML) 0.083% nebulizer solution 2.5 mg  2.5 mg Nebulization Once Jones, Deanna C, MD       ipratropium-albuterol (DUONEB) 0.5-2.5 (3) MG/3ML nebulizer solution 3 mL  3 mL Nebulization Q6H Juline Patch, MD       Current Outpatient Medications on File Prior to Encounter  Medication Sig Dispense Refill   Fluticasone-Umeclidin-Vilant (TRELEGY ELLIPTA) 100-62.5-25 MCG/ACT AEPB Inhale 1 Inhaler into the lungs daily.     acetaminophen (TYLENOL) 500 MG tablet Take 1,000 mg by mouth every 8 (eight) hours as needed (pain).     albuterol (PROVENTIL) (2.5 MG/3ML) 0.083% nebulizer solution Take 3 mLs (2.5 mg total) by nebulization every 6 (six)  hours as needed for wheezing or shortness of breath. 150 mL 1   citalopram (CELEXA) 40 MG tablet Take 1 tablet (40 mg total) by mouth daily. 90 tablet 1   gabapentin (NEURONTIN) 300 MG capsule Take 600 mg by mouth 3 (three) times daily. [place]     gemfibrozil (LOPID) 600 MG tablet Take 1 tablet (600 mg total) by mouth daily. 90 tablet 1   guaiFENesin-codeine (ROBITUSSIN AC) 100-10 MG/5ML syrup Take 5 mLs by mouth 4 (four) times daily as needed for cough. 118 mL 0   ipratropium (ATROVENT) 0.02 % nebulizer solution Take 1.25 mLs (0.25 mg total) by nebulization every 6 (six) hours as needed for wheezing or shortness of breath. 75 mL 0   levothyroxine (SYNTHROID) 112 MCG tablet Take 1 tablet (112 mcg total) by mouth daily. 90 tablet 1   loratadine (CLARITIN) 10 MG tablet Take 1 tablet (10 mg total) by mouth daily. 90 tablet 1   losartan (COZAAR) 25 MG tablet Take 2 tablets (50 mg total) by mouth daily. 180 tablet 1   metoprolol succinate (TOPROL-XL) 50 MG 24 hr tablet Take 1 tablet (50 mg total) by mouth daily. Take with or immediately following a meal. 90 tablet 1   montelukast (SINGULAIR) 10 MG tablet Take 1 tablet daily 90 tablet 1   Multiple Vitamins-Minerals (CENTRUM SILVER 50+WOMEN PO) Take 1 tablet by mouth daily.     nystatin cream (MYCOSTATIN) Apply 1 application topically 2 (two) times daily. 30 g 0   nystatin cream (MYCOSTATIN) Apply 1 application topically 2 (two) times daily. 30 g 11   predniSONE (DELTASONE) 10 MG tablet Take 1 tablet (10 mg total) by mouth daily with breakfast. 30 tablet 0   rivaroxaban (XARELTO) 20 MG TABS tablet Take 20 mg by mouth daily with supper. Cardiologist [facility]     simvastatin (ZOCOR) 20 MG tablet Take 1 tablet (20 mg total) by mouth daily. 90 tablet 1    Pertinent medications related to GI and procedure were reviewed by me with the patient prior to the procedure   Current Facility-Administered Medications:    albuterol (PROVENTIL) (2.5 MG/3ML) 0.083%  nebulizer solution 2.5 mg, 2.5 mg, Nebulization, Once, Jones, Deanna C, MD   ipratropium-albuterol (DUONEB) 0.5-2.5 (3) MG/3ML nebulizer solution 3 mL, 3 mL, Nebulization, Q6H, Jones, Deanna C, MD  Current Outpatient Medications:    Fluticasone-Umeclidin-Vilant (TRELEGY ELLIPTA) 100-62.5-25 MCG/ACT AEPB, Inhale 1 Inhaler into the lungs daily., Disp: , Rfl:    acetaminophen (TYLENOL) 500 MG tablet, Take 1,000 mg by mouth every 8 (eight) hours as needed (pain)., Disp: , Rfl:    albuterol (PROVENTIL) (2.5 MG/3ML) 0.083% nebulizer solution, Take 3 mLs (2.5 mg total) by nebulization every 6 (six) hours as needed for wheezing or shortness of breath., Disp: 150 mL, Rfl: 1   citalopram (CELEXA) 40 MG tablet, Take 1 tablet (40 mg total) by mouth daily., Disp: 90 tablet, Rfl: 1   gabapentin (NEURONTIN) 300 MG capsule, Take 600 mg by mouth 3 (three)  times daily. [place], Disp: , Rfl:    gemfibrozil (LOPID) 600 MG tablet, Take 1 tablet (600 mg total) by mouth daily., Disp: 90 tablet, Rfl: 1   guaiFENesin-codeine (ROBITUSSIN AC) 100-10 MG/5ML syrup, Take 5 mLs by mouth 4 (four) times daily as needed for cough., Disp: 118 mL, Rfl: 0   ipratropium (ATROVENT) 0.02 % nebulizer solution, Take 1.25 mLs (0.25 mg total) by nebulization every 6 (six) hours as needed for wheezing or shortness of breath., Disp: 75 mL, Rfl: 0   levothyroxine (SYNTHROID) 112 MCG tablet, Take 1 tablet (112 mcg total) by mouth daily., Disp: 90 tablet, Rfl: 1   loratadine (CLARITIN) 10 MG tablet, Take 1 tablet (10 mg total) by mouth daily., Disp: 90 tablet, Rfl: 1   losartan (COZAAR) 25 MG tablet, Take 2 tablets (50 mg total) by mouth daily., Disp: 180 tablet, Rfl: 1   metoprolol succinate (TOPROL-XL) 50 MG 24 hr tablet, Take 1 tablet (50 mg total) by mouth daily. Take with or immediately following a meal., Disp: 90 tablet, Rfl: 1   montelukast (SINGULAIR) 10 MG tablet, Take 1 tablet daily, Disp: 90 tablet, Rfl: 1   Multiple  Vitamins-Minerals (CENTRUM SILVER 50+WOMEN PO), Take 1 tablet by mouth daily., Disp: , Rfl:    nystatin cream (MYCOSTATIN), Apply 1 application topically 2 (two) times daily., Disp: 30 g, Rfl: 0   nystatin cream (MYCOSTATIN), Apply 1 application topically 2 (two) times daily., Disp: 30 g, Rfl: 11   predniSONE (DELTASONE) 10 MG tablet, Take 1 tablet (10 mg total) by mouth daily with breakfast., Disp: 30 tablet, Rfl: 0   rivaroxaban (XARELTO) 20 MG TABS tablet, Take 20 mg by mouth daily with supper. Cardiologist [facility], Disp: , Rfl:    simvastatin (ZOCOR) 20 MG tablet, Take 1 tablet (20 mg total) by mouth daily., Disp: 90 tablet, Rfl: 1      Allergies  Allergen Reactions   Baclofen Other (See Comments)    Weakness, confusion, tremors.     Penicillins Other (See Comments)    Has patient had a PCN reaction causing immediate rash, facial/tongue/throat swelling, SOB or lightheadedness with hypotension: No Has patient had a PCN reaction causing severe rash involving mucus membranes or skin necrosis: No Has patient had a PCN reaction that required hospitalization No Has patient had a PCN reaction occurring within the last 10 years: No If all of the above answers are "NO", then may proceed with Cephalosporin use.    Allergies were reviewed by me prior to the procedure  Objective    There were no vitals filed for this visit.   Physical Exam Vitals reviewed.  Constitutional:      General: She is not in acute distress.    Appearance: Normal appearance. She is not ill-appearing, toxic-appearing or diaphoretic.  HENT:     Head: Normocephalic and atraumatic.     Nose: Nose normal.     Mouth/Throat:     Mouth: Mucous membranes are moist.     Pharynx: Oropharynx is clear.  Eyes:     General: No scleral icterus.    Extraocular Movements: Extraocular movements intact.  Cardiovascular:     Rate and Rhythm: Normal rate and regular rhythm.     Heart sounds: Normal heart sounds. No murmur  heard.   No friction rub. No gallop.  Pulmonary:     Effort: Pulmonary effort is normal. No respiratory distress.     Breath sounds: Normal breath sounds. No wheezing, rhonchi or rales.  Abdominal:  General: Bowel sounds are normal. There is no distension.     Palpations: Abdomen is soft.     Tenderness: There is no abdominal tenderness. There is no guarding or rebound.  Musculoskeletal:     Cervical back: Neck supple.     Right lower leg: No edema.     Left lower leg: No edema.  Skin:    General: Skin is warm and dry.     Coloration: Skin is not jaundiced or pale.  Neurological:     General: No focal deficit present.     Mental Status: She is alert and oriented to person, place, and time. Mental status is at baseline.  Psychiatric:        Mood and Affect: Mood normal.        Behavior: Behavior normal.        Thought Content: Thought content normal.        Judgment: Judgment normal.     Assessment:  Ms. Danielle Taylor is a 75 y.o. female  who presents today for Colonoscopy for FOBT+; bright red blood per rectum.  Plan:  Colonoscopy with possible intervention today  Colonoscopy with possible biopsy, control of bleeding, polypectomy, and interventions as necessary has been discussed with the patient/patient representative. Informed consent was obtained from the patient/patient representative after explaining the indication, nature, and risks of the procedure including but not limited to death, bleeding, perforation, missed neoplasm/lesions, cardiorespiratory compromise, and reaction to medications. Opportunity for questions was given and appropriate answers were provided. Patient/patient representative has verbalized understanding is amenable to undergoing the procedure.   [name], DO  [facility]  Portions of the record may have been created with voice recognition software. Occasional wrong-word or 'sound-a-like' substitutions may have  occurred due to the inherent limitations of voice recognition software.  Read the chart carefully and recognize, using context, where substitutions may have occurred.

## 2021-07-14 ENCOUNTER — Ambulatory Visit: Payer: Medicare Other | Admitting: Certified Registered Nurse Anesthetist

## 2021-07-14 ENCOUNTER — Ambulatory Visit
Admission: RE | Admit: 2021-07-14 | Discharge: 2021-07-14 | Disposition: A | Discharge status: Home / Self Care | Payer: Medicare Other | Source: Ambulatory Visit | Attending: Gastroenterology | Admitting: Gastroenterology

## 2021-07-14 ENCOUNTER — Encounter: Payer: Self-pay | Admitting: *Deleted

## 2021-07-14 ENCOUNTER — Other Ambulatory Visit: Payer: Self-pay

## 2021-07-14 ENCOUNTER — Encounter
Admission: RE | Disposition: A | Discharge status: Home / Self Care | Payer: Self-pay | Source: Ambulatory Visit | Attending: Gastroenterology

## 2021-07-14 DIAGNOSIS — K649 Unspecified hemorrhoids: Secondary | ICD-10-CM | POA: Diagnosis not present

## 2021-07-14 DIAGNOSIS — I480 Paroxysmal atrial fibrillation: Secondary | ICD-10-CM | POA: Insufficient documentation

## 2021-07-14 DIAGNOSIS — Z7901 Long term (current) use of anticoagulants: Secondary | ICD-10-CM | POA: Diagnosis not present

## 2021-07-14 DIAGNOSIS — K644 Residual hemorrhoidal skin tags: Secondary | ICD-10-CM | POA: Diagnosis not present

## 2021-07-14 DIAGNOSIS — Z87891 Personal history of nicotine dependence: Secondary | ICD-10-CM | POA: Diagnosis not present

## 2021-07-14 DIAGNOSIS — D122 Benign neoplasm of ascending colon: Secondary | ICD-10-CM | POA: Insufficient documentation

## 2021-07-14 DIAGNOSIS — K5730 Diverticulosis of large intestine without perforation or abscess without bleeding: Secondary | ICD-10-CM | POA: Diagnosis not present

## 2021-07-14 DIAGNOSIS — K635 Polyp of colon: Secondary | ICD-10-CM | POA: Diagnosis not present

## 2021-07-14 DIAGNOSIS — K621 Rectal polyp: Secondary | ICD-10-CM | POA: Diagnosis not present

## 2021-07-14 DIAGNOSIS — Z79899 Other long term (current) drug therapy: Secondary | ICD-10-CM | POA: Insufficient documentation

## 2021-07-14 DIAGNOSIS — K640 First degree hemorrhoids: Secondary | ICD-10-CM | POA: Diagnosis not present

## 2021-07-14 DIAGNOSIS — D124 Benign neoplasm of descending colon: Secondary | ICD-10-CM | POA: Diagnosis not present

## 2021-07-14 DIAGNOSIS — I10 Essential (primary) hypertension: Secondary | ICD-10-CM | POA: Insufficient documentation

## 2021-07-14 DIAGNOSIS — K921 Melena: Secondary | ICD-10-CM | POA: Diagnosis not present

## 2021-07-14 DIAGNOSIS — J449 Chronic obstructive pulmonary disease, unspecified: Secondary | ICD-10-CM | POA: Diagnosis not present

## 2021-07-14 DIAGNOSIS — D123 Benign neoplasm of transverse colon: Secondary | ICD-10-CM | POA: Diagnosis not present

## 2021-07-14 DIAGNOSIS — E039 Hypothyroidism, unspecified: Secondary | ICD-10-CM | POA: Diagnosis not present

## 2021-07-14 HISTORY — DX: Other chronic pain: G89.29

## 2021-07-14 HISTORY — DX: Personal history of other infectious and parasitic diseases: Z86.19

## 2021-07-14 HISTORY — DX: Other hammer toe(s) (acquired), unspecified foot: M20.40

## 2021-07-14 HISTORY — DX: Hallux valgus (acquired), right foot: M20.11

## 2021-07-14 HISTORY — DX: Paroxysmal atrial fibrillation: I48.0

## 2021-07-14 HISTORY — PX: COLONOSCOPY WITH PROPOFOL: SHX5780

## 2021-07-14 HISTORY — DX: Diverticulosis of intestine, part unspecified, without perforation or abscess without bleeding: K57.90

## 2021-07-14 HISTORY — DX: Cerebral infarction, unspecified: I63.9

## 2021-07-14 HISTORY — DX: Unspecified chronic gastritis without bleeding: K29.50

## 2021-07-14 LAB — HM COLONOSCOPY

## 2021-07-14 SURGERY — COLONOSCOPY WITH PROPOFOL
Anesthesia: General

## 2021-07-14 MED ORDER — PROPOFOL 500 MG/50ML IV EMUL
INTRAVENOUS | Status: AC
  Filled 2021-07-14: qty 50

## 2021-07-14 MED ORDER — SODIUM CHLORIDE 0.9 % IV SOLN: INTRAVENOUS | Status: DC

## 2021-07-14 MED ORDER — PROPOFOL 500 MG/50ML IV EMUL
INTRAVENOUS | Status: DC | PRN
  Administered 2021-07-14: 150 ug/kg/min via INTRAVENOUS

## 2021-07-14 MED ORDER — GLYCOPYRROLATE 0.2 MG/ML IJ SOLN
INTRAMUSCULAR | Status: DC | PRN
  Administered 2021-07-14: .2 mg via INTRAVENOUS

## 2021-07-14 MED ORDER — LIDOCAINE HCL (CARDIAC) PF 100 MG/5ML IV SOSY
PREFILLED_SYRINGE | INTRAVENOUS | Status: DC | PRN
  Administered 2021-07-14: 50 mg via INTRAVENOUS

## 2021-07-14 MED ORDER — PROPOFOL 10 MG/ML IV BOLUS
INTRAVENOUS | Status: AC
  Filled 2021-07-14: qty 20

## 2021-07-14 MED ORDER — EPHEDRINE SULFATE 50 MG/ML IJ SOLN
INTRAMUSCULAR | Status: DC | PRN
  Administered 2021-07-14: 5 mg via INTRAVENOUS

## 2021-07-14 MED ORDER — PROPOFOL 10 MG/ML IV BOLUS
INTRAVENOUS | Status: DC | PRN
  Administered 2021-07-14: 20 mg via INTRAVENOUS
  Administered 2021-07-14: 40 mg via INTRAVENOUS
  Administered 2021-07-14: 60 mg via INTRAVENOUS

## 2021-07-14 NOTE — Op Note (Signed)
[facility] Gastroenterology Patient Name: Danielle Taylor Procedure Date: 07/14/2021 8:50 AM MRN: [id number] Account #: [id number] Date of Birth: Dec 24, 1945 Admit Type: Outpatient Age: 75 Room: [facility] ENDO ROOM 2 Gender: Female Note Status: Finalized Instrument Name: Colonoscope [id number] Procedure:             Colonoscopy Indications:           Heme positive stool Providers:             [name] DO, DO Referring MD:          Juline Patch, MD (Referring MD) Medicines:             Monitored Anesthesia Care Complications:         No immediate complications. Estimated blood loss:                         Minimal. Procedure:             Pre-Anesthesia Assessment:                        - Prior to the procedure, a History and Physical was                         performed, and patient medications and allergies were                         reviewed. The patient is competent. The risks and                         benefits of the procedure and the sedation options and                         risks were discussed with the patient. All questions                         were answered and informed consent was obtained.                         Patient identification and proposed procedure were                         verified by the physician, the nurse, the anesthetist                         and the technician in the endoscopy suite. Mental                         Status Examination: alert and oriented. Airway                         Examination: normal oropharyngeal airway and neck                         mobility. Respiratory Examination: clear to                         auscultation. CV Examination: RRR, no murmurs, no S3  or S4. Prophylactic Antibiotics: The patient does not                         require prophylactic antibiotics. Prior                         Anticoagulants: The patient has taken Xarelto                          (rivaroxaban), last dose was 6 days prior to                         procedure. ASA Grade Assessment: III - A patient with                         severe systemic disease. After reviewing the risks and                         benefits, the patient was deemed in satisfactory                         condition to undergo the procedure. The anesthesia                         plan was to use monitored anesthesia care (MAC).                         Immediately prior to administration of medications,                         the patient was re-assessed for adequacy to receive                         sedatives. The heart rate, respiratory rate, oxygen                         saturations, blood pressure, adequacy of pulmonary                         ventilation, and response to care were monitored                         throughout the procedure. The physical status of the                         patient was re-assessed after the procedure.                        After obtaining informed consent, the colonoscope was                         passed under direct vision. Throughout the procedure,                         the patient's blood pressure, pulse, and oxygen                         saturations were monitored continuously. The  Colonoscope was introduced through the anus and                         advanced to the the cecum, identified by appendiceal                         orifice and ileocecal valve. The colonoscopy was                         performed without difficulty. The patient tolerated                         the procedure well. The quality of the bowel                         preparation was evaluated using the BBPS [facility] Bowel                         Preparation Scale) with scores of: Right Colon = 2                         (minor amount of residual staining, small fragments of                         stool and/or opaque liquid, but mucosa seen well),                          Transverse Colon = 3 (entire mucosa seen well with no                         residual staining, small fragments of stool or opaque                         liquid) and Left Colon = 2 (minor amount of residual                         staining, small fragments of stool and/or opaque                         liquid, but mucosa seen well). The total BBPS score                         equals 7. The quality of the bowel preparation was                         good. The ileocecal valve, appendiceal orifice, and                         rectum were photographed. Findings:      Skin tags were found on perianal exam.      The digital rectal exam was normal. Pertinent negatives include normal       sphincter tone.      A few small-mouthed diverticula were found in the left colon. Estimated       blood loss: none.      Non-bleeding internal hemorrhoids were found during retroflexion. The       hemorrhoids were Grade  I (internal hemorrhoids that do not prolapse).       Estimated blood loss: none.      Spastic left colon, at times would limit visualization between folds      A 6 to 8 mm polyp was found in the ascending colon. The polyp was       sessile. The polyp was removed with a cold snare. Resection and       retrieval were complete. To prevent bleeding after the polypectomy, one       hemostatic clip was successfully placed (MR conditional). There was no       bleeding during the procedure. Estimated blood loss was minimal.      A 6 to 7 mm polyp was found in the transverse colon. The polyp was       sessile. The polyp was removed with a cold snare. Resection and       retrieval were complete. Estimated blood loss was minimal.      Two sessile polyps were found in the descending colon. The polyps were 6       to 7 mm in size. These polyps were removed with a cold snare. Resection       and retrieval were complete. Estimated blood loss was minimal.      Three sessile polyps were  found in the descending colon. The polyps were       1 to 2 mm in size. These polyps were removed with a cold biopsy forceps.       Resection and retrieval were complete. Estimated blood loss was minimal.      Two sessile polyps were found in the rectum. The polyps were 1 to 2 mm       in size. These polyps were removed with a cold biopsy forceps. Resection       and retrieval were complete. Estimated blood loss was minimal.      The exam was otherwise without abnormality on direct and retroflexion       views. Impression:            - Perianal skin tags found on perianal exam.                        - Diverticulosis in the left colon.                        - Non-bleeding internal hemorrhoids.                        - One 6 to 8 mm polyp in the ascending colon, removed                         with a cold snare. Resected and retrieved. Clip (MR                         conditional) was placed.                        - One 6 to 7 mm polyp in the transverse colon, removed                         with a cold snare. Resected and retrieved.                        -  Two 6 to 7 mm polyps in the descending colon,                         removed with a cold snare. Resected and retrieved.                        - Three 1 to 2 mm polyps in the descending colon,                         removed with a cold biopsy forceps. Resected and                         retrieved.                        - Two 1 to 2 mm polyps in the rectum, removed with a                         cold biopsy forceps. Resected and retrieved.                        - The examination was otherwise normal on direct and                         retroflexion views. Recommendation:        - Discharge patient to home.                        - Resume previous diet.                        - Resume Xarelto (rivaroxaban) at prior dose in 2                         days. Refer to referring physician for further                         adjustment  of therapy.                        - No aspirin, ibuprofen, naproxen, or other                         non-steroidal anti-inflammatory drugs for 5 days after                         polyp removal.                        - Continue present medications.                        - Await pathology results.                        - Repeat colonoscopy for surveillance based on                         pathology results.                        -  Return to referring physician as previously                         scheduled. Procedure Code(s):     --- Professional ---                        [phone], Colonoscopy, flexible; with removal of                         tumor(s), polyp(s), or other lesion(s) by snare                         technique                        45380, 26, Colonoscopy, flexible; with biopsy, single                         or multiple Diagnosis Code(s):     --- Professional ---                        K64.0, First degree hemorrhoids                        K63.5, Polyp of colon                        K62.1, Rectal polyp                        K64.4, Residual hemorrhoidal skin tags                        R19.5, Other fecal abnormalities                        K57.30, Diverticulosis of large intestine without                         perforation or abscess without bleeding CPT copyright 2019 American [employer] Association. All rights reserved. The codes documented in this report are preliminary and upon coder review may  be revised to meet current compliance requirements. Attending Participation:      I personally performed the entire procedure. Volney American, DO [name] DO, DO 07/14/2021 9:54:24 AM This report has been signed electronically. Number of Addenda: 0 Note Initiated On: 07/14/2021 8:50 AM Scope Withdrawal Time: 0 hours 32 minutes 39 seconds  Total Procedure Duration: 0 hours 46 minutes 20 seconds  Estimated Blood Loss:  Estimated blood loss was minimal.       [facility]

## 2021-07-14 NOTE — Interval H&P Note (Signed)
History and Physical Interval Note: Preprocedure H&P from 07/14/21  was reviewed and there was no interval change after seeing and examining the patient.  Written consent was obtained from the patient after discussion of risks, benefits, and alternatives. Patient has consented to proceed with Colonoscopy with possible intervention   07/14/2021 8:46 AM  Danielle Taylor  has presented today for surgery, with the diagnosis of Fecal Occult blood test + (R19.5) Rectal bleeding (K62.5).  The various methods of treatment have been discussed with the patient and family. After consideration of risks, benefits and other options for treatment, the patient has consented to  Procedure(s): COLONOSCOPY WITH PROPOFOL (N/A) as a surgical intervention.  The patient's history has been reviewed, patient examined, no change in status, stable for surgery.  I have reviewed the patient's chart and labs.  Questions were answered to the patient's satisfaction.     [name]

## 2021-07-14 NOTE — Anesthesia Preprocedure Evaluation (Signed)
Anesthesia Evaluation  Patient identified by MRN, date of birth, ID band Patient awake    Reviewed: Allergy & Precautions, H&P , NPO status , Patient's Chart, lab work & pertinent test results, reviewed documented beta blocker date and time   Airway Mallampati: II  TM Distance: >3 FB Neck ROM: Full    Dental  (+) Caps,    Pulmonary neg shortness of breath, asthma , COPD, former smoker,    Pulmonary exam normal        Cardiovascular hypertension, Pt. on medications and Pt. on home beta blockers + CAD  Normal cardiovascular exam+ dysrhythmias (CHB Paced) Atrial Fibrillation + pacemaker      Neuro/Psych PSYCHIATRIC DISORDERS Depression  Neuromuscular disease CVA, No Residual Symptoms    GI/Hepatic Neg liver ROS, hiatal hernia, Bowel prep,GERD  ,  Endo/Other  Hypothyroidism   Renal/GU negative Renal ROS  negative genitourinary   Musculoskeletal negative musculoskeletal ROS (+)   Abdominal   Peds negative pediatric ROS (+)  Hematology negative hematology ROS (+)   Anesthesia Other Findings . Allergic state  . Anticoagulation adequate with anticoagulant therapy  ParoxAFib; OAC=Xarelto; CHADSvasc=3 htn, age, gender  . Biventricular cardiac pacemaker in situ 10/24/2019  Medtronic Percepta [id number] biventricular pacemaker, implanted 10/24/19 with Medtronic 5086 MRI atrial and ventricular leads, both implanted 01/27/2011, and Medtronic [id number] left ventricular lead, implanted 10/24/19  . Cardiac pacemaker in situ 01/06/2017  . Chronic gastritis 07/24/1998  . Chronic low back pain with sciatica 02/15/2018  . Closed dislocation of metatarsal (bone), joint  plantar-displaced 2nd metatarsal  . Complete heart block, transient (CMS-HCC)  With resultant syncope and facial trauma, 1:1 AV conduction present during PM f/u  . COPD (chronic obstructive pulmonary disease) (CMS-HCC)  . Diverticulosis 04/26/2015  . Fitting and adjustment of  cardiac pacemaker 01/27/2011  Medtronic [id number] dual chamber pacemaker with Medtronic 5086 MRI atrial and ventricular leads, all implanted 01/27/11.  . Fitting or adjustment of biventricular cardiac pacemaker 10/24/2019, 01/27/2011  Medtronic Percepta [id number] biventricular pacemaker, implanted 10/24/19 with Medtronic 5086 MRI atrial and ventricular leads, both implanted 01/27/2011, and Medtronic 4798 left ventricular lead, implanted 10/24/19  . GERD (gastroesophageal reflux disease)  occasional, Prilosec prn  . Hallux valgus of right foot  . Hammertoe  Hammertoe contracture with crossover second toe  . Hiatal hernia  . History of depression  . History of Graves' disease  . History of hepatitis  . History of shingles  . History of stroke  ? mini--stroke?  Marland Kitchen Hyperlipidemia  . Hypothyroidism  Treated with radio iodine ablation, currently treated with thyroid hormone replacement  . Internal hemorrhoids 04/26/2015  . Mild left ventricular systolic dysfunction 14/78/2956  EF-50% by TTE October 2020 (CHB; RVpacing%=100%; PM DEPENDENT)  . Pacemaker  . 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 Paroxysmal atrial fibrillation (CMS-HCC)  Parox AT/AF on PM interrogation; OAC=Xarelto; CHADSvasc=3 htn, age, gender  . Penicillin allergy  . Postablative hypothyroidism  status post I-131 ablation for Graves' disease  . Wears glasses  03/11/16 ECHO: Study Conclusions   - Left ventricle: Systolic function was normal. The estimated  ejection fraction was in the range of 50% to 55%.   Reproductive/Obstetrics negative OB ROS  Anesthesia Physical Anesthesia Plan  ASA: 3  Anesthesia Plan: General   Post-op Pain  Management:    Induction: Intravenous  PONV Risk Score and Plan: 2 and Propofol infusion and TIVA  Airway Management Planned: Natural Airway and Nasal Cannula  Additional Equipment:   Intra-op Plan:   Post-operative Plan:   Informed Consent: I have reviewed the patients History and Physical, chart, labs and discussed the procedure including the risks, benefits and alternatives for the proposed anesthesia with the patient or authorized representative who has indicated his/her understanding and acceptance.       Plan Discussed with: CRNA, Anesthesiologist and Surgeon  Anesthesia Plan Comments:         Anesthesia Quick Evaluation

## 2021-07-14 NOTE — Anesthesia Postprocedure Evaluation (Signed)
Anesthesia Post Note  Patient: Onie Kasparek [facility]  Procedure(s) Performed: COLONOSCOPY WITH PROPOFOL  Patient location during evaluation: Phase II Anesthesia Type: General Level of consciousness: awake and alert, awake and oriented Pain management: pain level controlled Vital Signs Assessment: post-procedure vital signs reviewed and stable Respiratory status: spontaneous breathing, nonlabored ventilation and respiratory function stable Cardiovascular status: blood pressure returned to baseline and stable Postop Assessment: no apparent nausea or vomiting Anesthetic complications: no   No notable events documented.   Last Vitals:  Vitals:   07/14/21 1009 07/14/21 1019  BP: (!) 145/72 138/73  Pulse: 66 65  Resp: 14 15  Temp:    SpO2: 96% 98%    Last Pain:  Vitals:   07/14/21 1019  TempSrc:   PainSc: 0-No pain                 Phill Mutter

## 2021-07-14 NOTE — Progress Notes (Signed)
Vital signs to be included with H&P  Vitals with BMI 07/14/2021 07/14/2021 07/14/2021  Height - - -  Weight - - -  BMI - - -  Systolic 128 98 98  Diastolic 55 69 69  Pulse 70 76 75

## 2021-07-14 NOTE — Transfer of Care (Signed)
Immediate Anesthesia Transfer of Care Note  Patient: Danielle Taylor [facility]  Procedure(s) Performed: COLONOSCOPY WITH PROPOFOL  Patient Location: PACU  Anesthesia Type:General  Level of Consciousness: awake, alert  and oriented  Airway & Oxygen Therapy: Patient Spontanous Breathing  Post-op Assessment: Report given to RN and Post -op Vital signs reviewed and stable  Post vital signs: Reviewed and stable  Last Vitals:  Vitals Value Taken Time  BP 98/69 07/14/21 0951  Temp 36.3 C 07/14/21 0949  Pulse 74 07/14/21 0951  Resp 18 07/14/21 0951  SpO2 96 % 07/14/21 0951  Vitals shown include unvalidated device data.  Last Pain:  Vitals:   07/14/21 0949  TempSrc: Tympanic  PainSc: Asleep         Complications: No notable events documented.

## 2021-07-15 ENCOUNTER — Encounter: Payer: Self-pay | Admitting: Gastroenterology

## 2021-07-15 LAB — SURGICAL PATHOLOGY

## 2021-09-03 ENCOUNTER — Ambulatory Visit (INDEPENDENT_AMBULATORY_CARE_PROVIDER_SITE_OTHER): Payer: Medicare Other | Admitting: Family Medicine

## 2021-09-03 ENCOUNTER — Other Ambulatory Visit: Payer: Self-pay

## 2021-09-03 ENCOUNTER — Encounter: Payer: Self-pay | Admitting: Family Medicine

## 2021-09-03 ENCOUNTER — Other Ambulatory Visit: Payer: Self-pay | Admitting: Family Medicine

## 2021-09-03 VITALS — BP 130/80 | HR 80 | Ht 65.0 in | Wt 187.0 lb

## 2021-09-03 DIAGNOSIS — H00034 Abscess of left upper eyelid: Secondary | ICD-10-CM

## 2021-09-03 DIAGNOSIS — H01004 Unspecified blepharitis left upper eyelid: Secondary | ICD-10-CM | POA: Diagnosis not present

## 2021-09-03 MED ORDER — DOXYCYCLINE HYCLATE 100 MG PO TABS: 100.0000 mg | ORAL_TABLET | Freq: Two times a day (BID) | ORAL | 0 refills | Status: DC

## 2021-09-03 MED ORDER — BACITRA-NEOMYCIN-POLYMYXIN-HC 1 % OP OINT: 1.0000 "application " | TOPICAL_OINTMENT | Freq: Three times a day (TID) | OPHTHALMIC | 0 refills | Status: DC

## 2021-09-03 MED ORDER — BLEPHAMIDE S.O.P. 10-0.2 % OP OINT: 1.0000 "application " | TOPICAL_OINTMENT | Freq: Four times a day (QID) | OPHTHALMIC | 0 refills | Status: DC

## 2021-09-03 NOTE — Telephone Encounter (Signed)
Requested medication (s) are due for refill today:   Yes prescribed today  Requested medication (s) are on the active medication list:   N/A  Future visit scheduled:   N/A    Last ordered: today  Returned because this is on backorder.  See pharmacy note.   Requested Prescriptions  Pending Prescriptions Disp Refills   BLEPHAMIDE S.O.P. ophthalmic ointment [Pharmacy Med Name: BLEPHAMIDE EYE OINTMENT] 3.5 g 0    Sig: Place 1 application into the left eye 4 (four) times daily.     Not Delegated - Ophthalmology:  Corticosteroid & Antimicrobial Combinations Failed - 09/03/2021  9:48 AM      Failed - This refill cannot be delegated      Passed - Valid encounter within last 12 months    Recent Outpatient Visits           Today Cellulitis of left upper eyelid   [place], [place]   5 months ago Essential (primary) hypertension   [facility] Juline Patch, MD   5 months ago Acute maxillary sinusitis, recurrence not specified   [facility] Juline Patch, MD   6 months ago Interstitial lung disease [facility])   [facility] Juline Patch, MD   6 months ago Cough   [facility] Juline Patch, MD       Future Appointments             In 3 weeks Juline Patch, MD [facility], [facility]

## 2021-09-03 NOTE — Progress Notes (Signed)
Date:  09/03/2021   Name:  Danielle Taylor   DOB:  [birth year]   MRN:  [id number]   Chief Complaint: Eye Problem (L) eye swollen and red. Is looking better today, but still swollen. Not effecting vision. )  Eye Problem  The left eye is affected. This is a new problem. The current episode started in the past 7 days (friday). The problem occurs constantly. The problem has been gradually improving. The pain is mild (burning). Associated symptoms include an eye discharge. Pertinent negatives include no blurred vision, fever, foreign body sensation or nausea.   Lab Results  Component Value Date   NA 138 03/26/2021   K 4.6 03/26/2021   CO2 22 03/26/2021   GLUCOSE 99 03/26/2021   BUN 22 03/26/2021   CREATININE 0.89 03/26/2021   CALCIUM 9.8 03/26/2021   EGFR 69 03/26/2021   GFRNONAA 64 09/25/2020   Lab Results  Component Value Date   CHOL 203 (H) 03/26/2021   HDL 68 03/26/2021   LDLCALC 115 (H) 03/26/2021   TRIG 114 03/26/2021   CHOLHDL 3.5 02/08/2017   Lab Results  Component Value Date   TSH 0.801 03/26/2021   Lab Results  Component Value Date   HGBA1C 6.3 (H) 03/11/2016   Lab Results  Component Value Date   WBC 11.0 (H) 02/26/2021   HGB 11.3 (L) 02/26/2021   HCT 35.8 (L) 02/26/2021   MCV 86.3 02/26/2021   PLT 387 02/26/2021   Lab Results  Component Value Date   ALT 8 09/25/2020   AST 9 09/25/2020   ALKPHOS 105 09/25/2020   BILITOT 0.4 09/25/2020   No results found for: 25OHVITD2, 25OHVITD3, VD25OH   Review of Systems  Constitutional:  Negative for chills and fever.  HENT:  Negative for drooling, ear discharge, ear pain and sore throat.   Eyes:  Positive for discharge. Negative for blurred vision.  Respiratory:  Negative for cough, shortness of breath and wheezing.   Cardiovascular:  Negative for chest pain, palpitations and leg swelling.  Gastrointestinal:  Negative for abdominal pain, blood in stool, constipation, diarrhea and nausea.  Endocrine:  Negative for polydipsia.  Genitourinary:  Negative for dysuria, frequency, hematuria and urgency.  Musculoskeletal:  Negative for back pain, myalgias and neck pain.  Skin:  Negative for rash.  Allergic/Immunologic: Negative for environmental allergies.  Neurological:  Negative for dizziness and headaches.  Hematological:  Does not bruise/bleed easily.  Psychiatric/Behavioral:  Negative for suicidal ideas. The patient is not nervous/anxious.    Patient Active Problem List   Diagnosis Date Noted   Dupuytren's contracture 09/12/2019   Mild left ventricular systolic dysfunction 06/25/1116   Anticoagulation adequate with anticoagulant therapy 10/28/2018   Complete heart block, transient ([place]) 10/28/2018   COPD (chronic obstructive pulmonary disease) ([place]) 10/28/2018   Penicillin allergy 10/28/2018   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   Lumbar radiculopathy 06/29/2018   Chronic low back pain with sciatica 02/15/2018   Reactive airway disease, mild intermittent, uncomplicated 35/67/0141   Chronic seasonal allergic rhinitis due to pollen 08/10/2017   Mixed hyperlipidemia 08/10/2017   Depression 08/10/2017   Class 1 obesity due to excess calories without serious comorbidity with body mass index (BMI) of 34.0 to 34.9 in adult 08/10/2017   Chronic cough 06/15/2017   Interstitial lung disease ([place]) 06/15/2017   Centrilobular emphysema ([place]) 04/02/2017   Lumbar disc disease 04/02/2017   Hepatic steatosis 04/02/2017   Atherosclerosis of coronary artery of native heart 04/02/2017   Cardiac pacemaker in situ  01/06/2017   Paroxysmal A-fib (HCC) 03/30/2016   Syncope and collapse 03/11/2016   Right leg weakness 03/11/2016   Essential hypertension 03/11/2016   Hyperglycemia 03/11/2016   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     Penicillins Other (See Comments)    Has patient had a PCN reaction causing immediate rash, facial/tongue/throat swelling, SOB or lightheadedness with hypotension: No Has patient had a PCN reaction causing severe rash involving mucus membranes or skin necrosis: No Has patient had a PCN reaction that required hospitalization No Has patient had a PCN reaction occurring within the last 10 years: No If all of the above answers are "NO", then may proceed with Cephalosporin use.     Past Surgical History:  Procedure Laterality Date   APPENDECTOMY     COLONOSCOPY  08/24/1998   COLONOSCOPY WITH PROPOFOL N/A 04/26/2015   Procedure: COLONOSCOPY WITH PROPOFOL;  Surgeon: Hulen Luster, MD;  Location: [facility] ENDOSCOPY;  Service: Gastroenterology;  Laterality: N/A;   COLONOSCOPY WITH PROPOFOL N/A 07/14/2021   Procedure: COLONOSCOPY WITH PROPOFOL;  Surgeon: [name], DO;  Location: [facility] ENDOSCOPY;  Service: Gastroenterology;  Laterality: N/A;   EYE SURGERY     fibroid tumors     fingers and wrist   FOOT SURGERY     TONSILLECTOMY     TRANSFORAMINAL LUMBAR INTERBODY FUSION (TLIF) WITH PEDICLE SCREW FIXATION 1 LEVEL N/A 06/29/2018   Procedure: TRANSFORAMINAL LUMBAR INTERBODY FUSION (TLIF) WITH PEDICLE SCREW FIXATION 1 LEVEL;  Surgeon: [name], MD;  Location: [facility] ORS;  Service: Neurosurgery;  Laterality: N/A;   VAGINAL HYSTERECTOMY      Social History   Tobacco Use   Smoking status: Former    Packs/day: 0.50    Years: 33.00    Pack  years: 16.50    Types: Cigarettes    Quit date: 2003    Years since quitting: 20.0   Smokeless tobacco: Never   Tobacco comments:    smoking cessation materials not required  Vaping Use   Vaping Use: Never used  Substance Use Topics   Alcohol use: Yes    Alcohol/week: 1.0 standard drink    Types: 1 Glasses of wine per week    Comment: occassional   Drug use: Never     Medication list has been reviewed and updated.  Current Meds  Medication Sig   acetaminophen (TYLENOL) 500 MG tablet Take 1,000 mg by mouth every 8 (eight) hours as needed (pain).   albuterol (PROVENTIL) (2.5 MG/3ML) 0.083% nebulizer solution Take 3 mLs (2.5 mg total) by nebulization every 6 (six) hours as needed for wheezing or shortness of breath.   citalopram (CELEXA) 40 MG tablet Take 1 tablet (40 mg total) by mouth daily.   Fluticasone-Umeclidin-Vilant (TRELEGY ELLIPTA) 100-62.5-25 MCG/ACT AEPB Inhale 1 Inhaler into the lungs daily.   gabapentin (NEURONTIN) 300 MG capsule Take 600 mg by mouth 3 (three) times daily. [place]   gemfibrozil (LOPID) 600 MG tablet Take 1 tablet (  600 mg total) by mouth daily.   ipratropium (ATROVENT) 0.02 % nebulizer solution Take 1.25 mLs (0.25 mg total) by nebulization every 6 (six) hours as needed for wheezing or shortness of breath.   levothyroxine (SYNTHROID) 112 MCG tablet Take 1 tablet (112 mcg total) by mouth daily.   loratadine (CLARITIN) 10 MG tablet Take 1 tablet (10 mg total) by mouth daily.   losartan (COZAAR) 25 MG tablet Take 2 tablets (50 mg total) by mouth daily.   metoprolol succinate (TOPROL-XL) 50 MG 24 hr tablet Take 1 tablet (50 mg total) by mouth daily. Take with or immediately following a meal.   montelukast (SINGULAIR) 10 MG tablet Take 1 tablet daily   Multiple Vitamins-Minerals (CENTRUM SILVER 50+WOMEN PO) Take 1 tablet by mouth daily.   nystatin cream (MYCOSTATIN) Apply 1 application topically 2 (two) times daily.   nystatin cream (MYCOSTATIN) Apply 1  application topically 2 (two) times daily.   rivaroxaban (XARELTO) 20 MG TABS tablet Take 20 mg by mouth daily with supper. Cardiologist [facility]   simvastatin (ZOCOR) 20 MG tablet Take 1 tablet (20 mg total) by mouth daily.   Current Facility-Administered Medications for the 09/03/21 encounter (Office Visit) with Juline Patch, MD  Medication   albuterol (PROVENTIL) (2.5 MG/3ML) 0.083% nebulizer solution 2.5 mg   ipratropium-albuterol (DUONEB) 0.5-2.5 (3) MG/3ML nebulizer solution 3 mL    PHQ 2/9 Scores 03/26/2021 11/06/2020 09/25/2020 04/08/2020  PHQ - 2 Score 0 0 0 0  PHQ- 9 Score 0 - 0 0    GAD 7 : Generalized Anxiety Score 03/26/2021 09/25/2020 04/08/2020 09/07/2019  Nervous, Anxious, on Edge 0 0 0 0  Control/stop worrying 0 0 0 0  Worry too much - different things 0 0 0 0  Trouble relaxing 0 0 0 0  Restless 0 0 0 0  Easily annoyed or irritable 0 0 0 0  Afraid - awful might happen 0 0 0 0  Total GAD 7 Score 0 0 0 0  Anxiety Difficulty - - Not difficult at all -    BP Readings from Last 3 Encounters:  09/03/21 130/80  07/14/21 138/73  03/26/21 128/80    Physical Exam Vitals and nursing note reviewed.  Constitutional:      Appearance: She is well-developed.  HENT:     Head: Normocephalic.     Right Ear: Tympanic membrane and external ear normal.     Left Ear: Tympanic membrane and external ear normal.     Nose: Nose normal.  Eyes:     General: Lids are everted, no foreign bodies appreciated. Vision grossly intact. Gaze aligned appropriately. No scleral icterus.       Left eye: No foreign body or hordeolum.     Conjunctiva/sclera: Conjunctivae normal.     Right eye: Right conjunctiva is not injected.     Left eye: Left conjunctiva is not injected.     Pupils: Pupils are equal, round, and reactive to light.     Comments: Crusting along eyelid/smallcyst noted  Neck:     Thyroid: No thyromegaly.     Vascular: No JVD.     Trachea: No tracheal deviation.  Cardiovascular:     Rate  and Rhythm: Normal rate and regular rhythm.     Heart sounds: Normal heart sounds. No murmur heard.   No friction rub. No gallop.  Pulmonary:     Effort: Pulmonary effort is normal. No respiratory distress.     Breath sounds: Normal breath sounds. No wheezing  or rales.  Abdominal:     General: Bowel sounds are normal.     Palpations: Abdomen is soft. There is no mass.     Tenderness: There is no abdominal tenderness. There is no guarding or rebound.  Musculoskeletal:        General: No tenderness. Normal range of motion.     Cervical back: Normal range of motion and neck supple.  Lymphadenopathy:     Cervical: No cervical adenopathy.  Skin:    General: Skin is warm.     Findings: No rash.  Neurological:     Mental Status: She is alert and oriented to person, place, and time.     Cranial Nerves: No cranial nerve deficit.     Deep Tendon Reflexes: Reflexes normal.  Psychiatric:        Mood and Affect: Mood is not anxious or depressed.    Wt Readings from Last 3 Encounters:  09/03/21 187 lb (84.8 kg)  07/14/21 185 lb (83.9 kg)  03/26/21 183 lb (83 kg)    BP 130/80    Pulse 80    Ht _0  (1.651 m)    Wt 187 lb (84.8 kg)    BMI 31.12 kg/m   Assessment and Plan:  1. Cellulitis of left upper eyelid New onset.  Persistent.  On examination there is crusting on the eyelid with erythema of the upper eyelid and a small sebaceous cyst noted.  There is no drainage from the cyst but this likely will need to be incised and drained and I have suggested that she call her ophthalmologist for evaluation.  In the meantime we will treat topically with sulfacetamide prednisone 1 application 4 times a day for 3 days and doxycycline 100 mg twice a day. - sulfacetaminde-prednisoLONE (BLEPHAMIDE S.O.P.) ophthalmic ointment; Place 1 application into the left eye 4 (four) times daily.  Dispense: 3.5 g; Refill: 0 - doxycycline (VIBRA-TABS) 100 MG tablet; Take 1 tablet (100 mg total) by mouth 2 (two) times  daily.  Dispense: 14 tablet; Refill: 0  2. Blepharitis of left upper eyelid, unspecified type As noted above - sulfacetaminde-prednisoLONE (BLEPHAMIDE S.O.P.) ophthalmic ointment; Place 1 application into the left eye 4 (four) times daily.  Dispense: 3.5 g; Refill: 0 - doxycycline (VIBRA-TABS) 100 MG tablet; Take 1 tablet (100 mg total) by mouth 2 (two) times daily.  Dispense: 14 tablet; Refill: 0

## 2021-09-03 NOTE — Progress Notes (Signed)
Called in new ointment in place of Blephamide

## 2021-09-08 DIAGNOSIS — R0609 Other forms of dyspnea: Secondary | ICD-10-CM | POA: Diagnosis not present

## 2021-09-08 DIAGNOSIS — R053 Chronic cough: Secondary | ICD-10-CM | POA: Diagnosis not present

## 2021-09-08 DIAGNOSIS — E669 Obesity, unspecified: Secondary | ICD-10-CM | POA: Diagnosis not present

## 2021-09-12 DIAGNOSIS — Z23 Encounter for immunization: Secondary | ICD-10-CM | POA: Diagnosis not present

## 2021-09-26 ENCOUNTER — Ambulatory Visit: Payer: Self-pay | Admitting: Family Medicine

## 2021-09-28 ENCOUNTER — Other Ambulatory Visit: Payer: Self-pay | Admitting: Family Medicine

## 2021-09-28 DIAGNOSIS — I10 Essential (primary) hypertension: Secondary | ICD-10-CM

## 2021-10-07 ENCOUNTER — Other Ambulatory Visit: Payer: Self-pay | Admitting: Family Medicine

## 2021-10-07 DIAGNOSIS — E782 Mixed hyperlipidemia: Secondary | ICD-10-CM

## 2021-10-09 ENCOUNTER — Encounter: Payer: Self-pay | Admitting: Family Medicine

## 2021-10-09 ENCOUNTER — Ambulatory Visit (INDEPENDENT_AMBULATORY_CARE_PROVIDER_SITE_OTHER): Payer: Medicare Other | Admitting: Family Medicine

## 2021-10-09 ENCOUNTER — Other Ambulatory Visit: Payer: Self-pay

## 2021-10-09 VITALS — BP 122/80 | HR 64 | Ht 65.0 in | Wt 196.0 lb

## 2021-10-09 DIAGNOSIS — E7849 Other hyperlipidemia: Secondary | ICD-10-CM | POA: Diagnosis not present

## 2021-10-09 DIAGNOSIS — J301 Allergic rhinitis due to pollen: Secondary | ICD-10-CM | POA: Diagnosis not present

## 2021-10-09 DIAGNOSIS — E782 Mixed hyperlipidemia: Secondary | ICD-10-CM

## 2021-10-09 DIAGNOSIS — E039 Hypothyroidism, unspecified: Secondary | ICD-10-CM | POA: Diagnosis not present

## 2021-10-09 DIAGNOSIS — F32A Depression, unspecified: Secondary | ICD-10-CM | POA: Diagnosis not present

## 2021-10-09 DIAGNOSIS — I10 Essential (primary) hypertension: Secondary | ICD-10-CM

## 2021-10-09 DIAGNOSIS — L821 Other seborrheic keratosis: Secondary | ICD-10-CM

## 2021-10-09 DIAGNOSIS — J4520 Mild intermittent asthma, uncomplicated: Secondary | ICD-10-CM

## 2021-10-09 LAB — POCT RAPID STREP A (OFFICE): Rapid Strep A Screen: NEGATIVE

## 2021-10-09 MED ORDER — MONTELUKAST SODIUM 10 MG PO TABS: ORAL_TABLET | ORAL | 1 refills | Status: DC

## 2021-10-09 MED ORDER — GEMFIBROZIL 600 MG PO TABS: 600.0000 mg | ORAL_TABLET | Freq: Every day | ORAL | 1 refills | Status: DC

## 2021-10-09 MED ORDER — CITALOPRAM HYDROBROMIDE 40 MG PO TABS: 40.0000 mg | ORAL_TABLET | Freq: Every day | ORAL | 1 refills | Status: DC

## 2021-10-09 MED ORDER — LOSARTAN POTASSIUM 25 MG PO TABS: 50.0000 mg | ORAL_TABLET | Freq: Every day | ORAL | 1 refills | Status: DC

## 2021-10-09 MED ORDER — LORATADINE 10 MG PO TABS: 10.0000 mg | ORAL_TABLET | Freq: Every day | ORAL | 1 refills | Status: DC

## 2021-10-09 MED ORDER — METOPROLOL SUCCINATE ER 50 MG PO TB24: 50.0000 mg | ORAL_TABLET | Freq: Every day | ORAL | 1 refills | Status: DC

## 2021-10-09 MED ORDER — LEVOTHYROXINE SODIUM 112 MCG PO TABS: 112.0000 ug | ORAL_TABLET | Freq: Every day | ORAL | 1 refills | Status: DC

## 2021-10-09 MED ORDER — SIMVASTATIN 20 MG PO TABS: 20.0000 mg | ORAL_TABLET | Freq: Every day | ORAL | 1 refills | Status: DC

## 2021-10-09 NOTE — Progress Notes (Signed)
Date:  10/09/2021   Name:  Danielle Taylor   DOB:  09/23/1945   MRN:  [id number]   Chief Complaint: Hypothyroidism, Allergic Rhinitis , Hyperlipidemia, Hypertension, Depression, and Sore Throat (Started yesterday)  Hyperlipidemia This is a chronic problem. The current episode started more than 1 year ago. The problem is controlled. Recent lipid tests were reviewed and are normal. Exacerbating diseases include hypothyroidism. She has no history of chronic renal disease. Pertinent negatives include no chest pain, focal sensory loss, focal weakness, leg pain, myalgias or shortness of breath. The current treatment provides moderate improvement of lipids. Risk factors for coronary artery disease include dyslipidemia and hypertension.  Hypertension This is a chronic problem. The current episode started more than 1 year ago. The problem has been gradually improving since onset. The problem is controlled. Pertinent negatives include no blurred vision, chest pain, headaches or shortness of breath. Risk factors for coronary artery disease include dyslipidemia. Past treatments include beta blockers and angiotensin blockers. The current treatment provides moderate improvement. There are no compliance problems.  There is no history of angina, kidney disease, CAD/MI, CVA, heart failure, left ventricular hypertrophy, PVD or retinopathy. Identifiable causes of hypertension include a thyroid problem. There is no history of chronic renal disease, a hypertension causing med or renovascular disease.  Depression        This is a chronic problem.  The current episode started more than 1 year ago.   The problem occurs intermittently.  Associated symptoms include no fatigue, no myalgias and no headaches.  Past treatments include SSRIs - Selective serotonin reuptake inhibitors.  Compliance with treatment is good.  Previous treatment provided moderate relief.  Past medical history includes hypothyroidism and thyroid  problem.   Sore Throat  This is a new problem. The current episode started in the past 7 days. There has been no fever. The pain is mild. Associated symptoms include coughing. Pertinent negatives include no abdominal pain, congestion, diarrhea, ear discharge, ear pain, headaches, shortness of breath, stridor or vomiting.  Thyroid Problem Presents for follow-up visit. Patient reports no anxiety, cold intolerance, constipation, depressed mood, diarrhea, fatigue, hair loss, heat intolerance, menstrual problem, weight gain or weight loss. The symptoms have been stable. Her past medical history is significant for hyperlipidemia. There is no history of heart failure.   Lab Results  Component Value Date   NA 138 03/26/2021   K 4.6 03/26/2021   CO2 22 03/26/2021   GLUCOSE 99 03/26/2021   BUN 22 03/26/2021   CREATININE 0.89 03/26/2021   CALCIUM 9.8 03/26/2021   EGFR 69 03/26/2021   GFRNONAA 64 09/25/2020   Lab Results  Component Value Date   CHOL 203 (H) 03/26/2021   HDL 68 03/26/2021   LDLCALC 115 (H) 03/26/2021   TRIG 114 03/26/2021   CHOLHDL 3.5 02/08/2017   Lab Results  Component Value Date   TSH 0.801 03/26/2021   Lab Results  Component Value Date   HGBA1C 6.3 (H) 03/11/2016   Lab Results  Component Value Date   WBC 11.0 (H) 02/26/2021   HGB 11.3 (L) 02/26/2021   HCT 35.8 (L) 02/26/2021   MCV 86.3 02/26/2021   PLT 387 02/26/2021   Lab Results  Component Value Date   ALT 8 09/25/2020   AST 9 09/25/2020   ALKPHOS 105 09/25/2020   BILITOT 0.4 09/25/2020   No results found for: 25OHVITD2, 25OHVITD3, VD25OH   Review of Systems  Constitutional: Negative.  Negative for chills, fatigue, fever, unexpected  weight change, weight gain and weight loss.  HENT:  Negative for congestion, ear discharge, ear pain, rhinorrhea, sinus pressure, sneezing and sore throat.   Eyes:  Negative for blurred vision, photophobia, pain, discharge, redness and itching.  Respiratory:  Positive for  cough. Negative for shortness of breath, wheezing and stridor.   Cardiovascular:  Negative for chest pain.  Gastrointestinal:  Negative for abdominal pain, blood in stool, constipation, diarrhea, nausea and vomiting.  Endocrine: Negative for cold intolerance, heat intolerance, polydipsia, polyphagia and polyuria.  Genitourinary:  Negative for dysuria, flank pain, frequency, hematuria, menstrual problem, pelvic pain, urgency, vaginal bleeding and vaginal discharge.  Musculoskeletal:  Negative for arthralgias, back pain and myalgias.  Skin:  Negative for rash.  Allergic/Immunologic: Negative for environmental allergies and food allergies.  Neurological:  Negative for dizziness, focal weakness, weakness, light-headedness, numbness and headaches.  Hematological:  Negative for adenopathy. Does not bruise/bleed easily.  Psychiatric/Behavioral:  Positive for depression. Negative for dysphoric mood. The patient is not nervous/anxious.    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   Lumbar radiculopathy 06/29/2018   Chronic low back pain with sciatica 02/15/2018   Reactive airway disease, mild intermittent, uncomplicated 40/81/4481   Chronic seasonal allergic rhinitis due to pollen 08/10/2017   Mixed hyperlipidemia 08/10/2017   Depression 08/10/2017   Class 1 obesity due to excess calories without serious comorbidity with body mass index (BMI) of 34.0 to 34.9 in adult 08/10/2017   Chronic cough 06/15/2017   Interstitial lung disease ([place]) 06/15/2017   Centrilobular emphysema ([place]) 04/02/2017   Lumbar disc disease 04/02/2017   Hepatic steatosis  04/02/2017   Atherosclerosis of coronary artery of native heart 04/02/2017   Cardiac pacemaker in situ 01/06/2017   Paroxysmal A-fib ([place]) 03/30/2016   Syncope and collapse 03/11/2016   Right leg weakness 03/11/2016   Essential hypertension 03/11/2016   Hyperglycemia 03/11/2016   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     Penicillins Other (See Comments)    Has patient had a PCN reaction causing immediate rash, facial/tongue/throat swelling, SOB or lightheadedness with hypotension: No Has patient had a PCN reaction causing severe rash involving mucus membranes or skin necrosis: No Has patient had a PCN reaction that required hospitalization No Has patient had a PCN reaction occurring within the last 10 years: No If all of the above answers are "NO", then may proceed with Cephalosporin use.     Past Surgical History:  Procedure Laterality Date   APPENDECTOMY     COLONOSCOPY  08/24/1998   COLONOSCOPY WITH PROPOFOL N/A 04/26/2015   Procedure: COLONOSCOPY WITH PROPOFOL;  Surgeon: Hulen Luster, MD;  Location: [facility] ENDOSCOPY;  Service: Gastroenterology;  Laterality:  N/A;   COLONOSCOPY WITH PROPOFOL N/A 07/14/2021   Procedure: COLONOSCOPY WITH PROPOFOL;  Surgeon: [name], DO;  Location: [facility] ENDOSCOPY;  Service: Gastroenterology;  Laterality: N/A;   EYE SURGERY     fibroid  tumors     fingers and wrist   FOOT SURGERY     TONSILLECTOMY     TRANSFORAMINAL LUMBAR INTERBODY FUSION (TLIF) WITH PEDICLE SCREW FIXATION 1 LEVEL N/A 06/29/2018   Procedure: TRANSFORAMINAL LUMBAR INTERBODY FUSION (TLIF) WITH PEDICLE SCREW FIXATION 1 LEVEL;  Surgeon: [name], MD;  Location: [facility] ORS;  Service: Neurosurgery;  Laterality: N/A;   VAGINAL HYSTERECTOMY      Social History   Tobacco Use   Smoking status: Former    Packs/day: 0.50    Years: 33.00    Pack years: 16.50    Types: Cigarettes    Quit date: 2003    Years since quitting: 20.1   Smokeless tobacco: Never   Tobacco comments:    smoking cessation materials not required  Vaping Use   Vaping Use: Never used  Substance Use Topics   Alcohol use: Yes    Alcohol/week: 1.0 standard drink    Types: 1 Glasses of wine per week    Comment: occassional   Drug use: Never     Medication list has been reviewed and updated.  Current Meds  Medication Sig   acetaminophen (TYLENOL) 500 MG tablet Take 1,000 mg by mouth every 8 (eight) hours as needed (pain).   albuterol (PROVENTIL) (2.5 MG/3ML) 0.083% nebulizer solution Take 3 mLs (2.5 mg total) by nebulization every 6 (six) hours as needed for wheezing or shortness of breath.   bacitracin-neomycin-polymyxin-hydrocortisone (CORTISPORIN) 1 % ophthalmic ointment Place 1 application into the left eye 3 (three) times daily.   citalopram (CELEXA) 40 MG tablet Take 1 tablet (40 mg total) by mouth daily.   Fluticasone-Umeclidin-Vilant (TRELEGY ELLIPTA) 100-62.5-25 MCG/ACT AEPB Inhale 1 Inhaler into the lungs daily.   gabapentin (NEURONTIN) 300 MG capsule Take 600 mg by mouth 3 (three) times daily. [place]   gemfibrozil (LOPID) 600 MG tablet Take 1 tablet (600 mg total) by mouth daily.   ipratropium (ATROVENT) 0.02 % nebulizer solution Take 1.25 mLs (0.25 mg total) by nebulization every 6 (six) hours as needed for wheezing or shortness of breath.   levothyroxine  (SYNTHROID) 112 MCG tablet Take 1 tablet (112 mcg total) by mouth daily.   loratadine (CLARITIN) 10 MG tablet Take 1 tablet (10 mg total) by mouth daily.   losartan (COZAAR) 25 MG tablet Take 2 tablets (50 mg total) by mouth daily.   metoprolol succinate (TOPROL-XL) 50 MG 24 hr tablet TAKE 1 TABLET BY MOUTH DAILY. TAKE WITH OR IMMEDIATELY FOLLOWING A MEAL.   montelukast (SINGULAIR) 10 MG tablet Take 1 tablet daily   Multiple Vitamins-Minerals (CENTRUM SILVER 50+WOMEN PO) Take 1 tablet by mouth daily.   nystatin cream (MYCOSTATIN) Apply 1 application topically 2 (two) times daily.   nystatin cream (MYCOSTATIN) Apply 1 application topically 2 (two) times daily.   rivaroxaban (XARELTO) 20 MG TABS tablet Take 20 mg by mouth daily with supper. Cardiologist [facility]   simvastatin (ZOCOR) 20 MG tablet TAKE 1 TABLET BY MOUTH EVERY DAY   Current Facility-Administered Medications for the 10/09/21 encounter (Office Visit) with Juline Patch, MD  Medication   albuterol (PROVENTIL) (2.5 MG/3ML) 0.083% nebulizer solution 2.5 mg   ipratropium-albuterol (DUONEB) 0.5-2.5 (3) MG/3ML nebulizer solution 3 mL    PHQ 2/9 Scores 10/09/2021 03/26/2021 11/06/2020 09/25/2020  PHQ - 2 Score 0 0 0 0  PHQ- 9 Score 0 0 - 0    GAD 7 : Generalized Anxiety Score 10/09/2021 03/26/2021 09/25/2020 04/08/2020  Nervous, Anxious, on Edge 0 0 0 0  Control/stop worrying 0 0 0 0  Worry too much - different things 0 0 0 0  Trouble relaxing 0 0 0 0  Restless 0 0 0 0  Easily annoyed or irritable 0 0 0 0  Afraid - awful might happen 0 0 0 0  Total GAD 7 Score 0 0 0 0  Anxiety Difficulty Not difficult at all - - Not difficult at all    BP Readings from Last 3 Encounters:  10/09/21 122/80  09/03/21 130/80  07/14/21 138/73    Physical Exam Vitals and nursing note reviewed. Exam conducted with a chaperone present.  Constitutional:      General: She is not in acute distress.    Appearance: She is not diaphoretic.  HENT:     Head:  Normocephalic and atraumatic.     Right Ear: External ear normal.     Left Ear: External ear normal.     Nose: Nose normal.  Eyes:     General:        Right eye: No discharge.        Left eye: No discharge.     Conjunctiva/sclera: Conjunctivae normal.     Pupils: Pupils are equal, round, and reactive to light.  Neck:     Thyroid: No thyromegaly.     Vascular: No JVD.  Cardiovascular:     Rate and Rhythm: Normal rate and regular rhythm.     Heart sounds: Normal heart sounds. No murmur heard.   No friction rub. No gallop.  Pulmonary:     Effort: Pulmonary effort is normal.     Breath sounds: Normal breath sounds.  Abdominal:     General: Bowel sounds are normal.     Palpations: Abdomen is soft. There is no mass.     Tenderness: There is no abdominal tenderness. There is no guarding.  Musculoskeletal:        General: Normal range of motion.     Cervical back: Normal range of motion and neck supple.  Lymphadenopathy:     Cervical: No cervical adenopathy.  Skin:    General: Skin is warm and dry.     Capillary Refill: Capillary refill takes less than 2 seconds.     Comments: Waxy/pigmented  Neurological:     Mental Status: She is alert.     Deep Tendon Reflexes: Reflexes are normal and symmetric.    Wt Readings from Last 3 Encounters:  10/09/21 196 lb (88.9 kg)  09/03/21 187 lb (84.8 kg)  07/14/21 185 lb (83.9 kg)    BP 122/80    Pulse 64    Ht 5' 5"  (1.651 m)    Wt 196 lb (88.9 kg)    BMI 32.62 kg/m   Assessment and Plan:  1. Essential (primary) hypertension Chronic.  Controlled.  Stable.  Blood pressure 122/80 continue losartan 25 mg 2 tablets once a day and metoprolol XL 50 mg once a day.  Will check CMP for electrolytes and GFR. - losartan (COZAAR) 25 MG tablet; Take 2 tablets (50 mg total) by mouth daily.  Dispense: 180 tablet; Refill: 1 - metoprolol succinate (TOPROL-XL) 50 MG 24 hr tablet; Take 1 tablet (50 mg total) by mouth daily. TAKE WITH OR IMMEDIATELY  FOLLOWING A MEAL.  Dispense: 90  tablet; Refill: 1 - Comprehensive Metabolic Panel (CMET)  2. Familial multiple lipoprotein-type hyperlipidemia Chronic.  Controlled.  Stable.  Continue gemfibrozil 600 mg 1 tablet daily.  Will check lipid panel for current status of LDL. - gemfibrozil (LOPID) 600 MG tablet; Take 1 tablet (600 mg total) by mouth daily.  Dispense: 90 tablet; Refill: 1 - Lipid Panel With LDL/HDL Ratio  3. Mixed hyperlipidemia Chronic.  Controlled.  Stable.  Continue simvastatin 20 mg once a day. - gemfibrozil (LOPID) 600 MG tablet; Take 1 tablet (600 mg total) by mouth daily.  Dispense: 90 tablet; Refill: 1 - simvastatin (ZOCOR) 20 MG tablet; Take 1 tablet (20 mg total) by mouth daily.  Dispense: 90 tablet; Refill: 1 - Lipid Panel With LDL/HDL Ratio  4. Depression, unspecified depression type Chronic.  Controlled.  Stable.  PHQ 0 gad score 0 continue citalopram 40 mg once a day. - citalopram (CELEXA) 40 MG tablet; Take 1 tablet (40 mg total) by mouth daily.  Dispense: 90 tablet; Refill: 1  5. Hypothyroidism, unspecified type Chronic.  Controlled.  Stable.  Pending TSH we will likely continue levothyroxine 112 mcg daily. - levothyroxine (SYNTHROID) 112 MCG tablet; Take 1 tablet (112 mcg total) by mouth daily.  Dispense: 90 tablet; Refill: 1 - TSH  6. Chronic seasonal allergic rhinitis due to pollen New onset.  Episodic worsening of chronic seasonal rhinitis most likely secondary to the increased pollen count from tree pollens.  Will initiate loratadine 10 mg once a day and Singulair 10 mg once a day.  Will check rapid strep. - loratadine (CLARITIN) 10 MG tablet; Take 1 tablet (10 mg total) by mouth daily.  Dispense: 90 tablet; Refill: 1 - montelukast (SINGULAIR) 10 MG tablet; Take 1 tablet daily  Dispense: 90 tablet; Refill: 1 - POCT rapid strep A  7. Reactive airway disease, mild intermittent, uncomplicated Chronic.  Controlled.  Stable.  Continue Singulair 10 mg once a  day. - montelukast (SINGULAIR) 10 MG tablet; Take 1 tablet daily  Dispense: 90 tablet; Refill: 1 - POCT rapid strep A  8. Seborrheic keratoses Patient was in [place] and her husband noticed there was a dark spot on her back.  This appears to be a seborrheic keratosis we will refer to dermatology and that she is #worst) that she has risk for atypia of her multiple nevus this 1 in particular appears to be benign but it needs to be ultimately rechecked as well. - Ambulatory referral to Dermatology   9.  Viral pharyngitis.  Strep test is negative throat is erythematous status post cruise.  Have suggested since we have a negative test that we continue with over-the-counter preparations unless runs fever or persistent in symptomatology.

## 2021-10-10 LAB — LIPID PANEL WITH LDL/HDL RATIO
Cholesterol, Total: 190 mg/dL (ref 100–199)
HDL: 64 mg/dL (ref >39)
LDL Chol Calc (NIH): 106 mg/dL — ABNORMAL HIGH (ref 0–99)
LDL/HDL Ratio: 1.7 ratio (ref 0.0–3.2)
Triglycerides: 114 mg/dL (ref 0–149)
VLDL Cholesterol Cal: 20 mg/dL (ref 5–40)

## 2021-10-10 LAB — COMPREHENSIVE METABOLIC PANEL
ALT: 17 IU/L (ref 0–32)
AST: 19 IU/L (ref 0–40)
Albumin/Globulin Ratio: 1.9 (ref 1.2–2.2)
Albumin: 4.2 g/dL (ref 3.7–4.7)
Alkaline Phosphatase: 80 IU/L (ref 44–121)
BUN/Creatinine Ratio: 21 (ref 12–28)
BUN: 20 mg/dL (ref 8–27)
Bilirubin Total: 0.5 mg/dL (ref 0.0–1.2)
CO2: 24 mmol/L (ref 20–29)
Calcium: 9.2 mg/dL (ref 8.7–10.3)
Chloride: 104 mmol/L (ref 96–106)
Creatinine, Ser: 0.94 mg/dL (ref 0.57–1.00)
Globulin, Total: 2.2 g/dL (ref 1.5–4.5)
Glucose: 84 mg/dL (ref 70–99)
Potassium: 4.5 mmol/L (ref 3.5–5.2)
Sodium: 140 mmol/L (ref 134–144)
Total Protein: 6.4 g/dL (ref 6.0–8.5)
eGFR: 64 mL/min/{1.73_m2} (ref >59)

## 2021-10-10 LAB — TSH: TSH: 0.732 u[IU]/mL (ref 0.450–4.500)

## 2021-11-10 ENCOUNTER — Ambulatory Visit: Payer: Medicare Other

## 2021-11-17 ENCOUNTER — Ambulatory Visit (INDEPENDENT_AMBULATORY_CARE_PROVIDER_SITE_OTHER): Payer: Medicare Other

## 2021-11-17 DIAGNOSIS — Z0000 Encounter for general adult medical examination without abnormal findings: Secondary | ICD-10-CM | POA: Diagnosis not present

## 2021-11-17 DIAGNOSIS — Z1231 Encounter for screening mammogram for malignant neoplasm of breast: Secondary | ICD-10-CM

## 2021-11-17 NOTE — Patient Instructions (Signed)
Danielle Taylor , ?Thank you for taking time to come for your Medicare Wellness Visit. I appreciate your ongoing commitment to your health goals. Please review the following plan we discussed and let me know if I can assist you in the future.  ? ?Screening recommendations/referrals: ?Colonoscopy: done 07/14/21. Repeat 06/2026 ?Mammogram: done 4/26/222. Please call [phone] to schedule your mammogram.  ?Bone Density: done 12/17/20 ?Recommended yearly ophthalmology/optometry visit for glaucoma screening and checkup ?Recommended yearly dental visit for hygiene and checkup ? ?Vaccinations: ?Influenza vaccine: done 05/06/21 ?Pneumococcal vaccine: done 11/15/17 ?Tdap vaccine: done 06/08/20 ?Shingles vaccine: Shingrix discussed. Please contact your pharmacy for coverage information.  ?Covid-19: done 10/14/19, 11/07/19, 08/13/20, 03/28/21 & 09/12/21 ? ? ?Conditions/risks identified: Recommend increasing physical activity as tolerated ? ?Next appointment: Follow up in one year for your annual wellness visit  ? ? ?Preventive Care 12 Years and Older, Female ?Preventive care refers to lifestyle choices and visits with your health care provider that can promote health and wellness. ?What does preventive care include? ?A yearly physical exam. This is also called an annual well check. ?Dental exams once or twice a year. ?Routine eye exams. Ask your health care provider how often you should have your eyes checked. ?Personal lifestyle choices, including: ?Daily care of your teeth and gums. ?Regular physical activity. ?Eating a healthy diet. ?Avoiding tobacco and drug use. ?Limiting alcohol use. ?Practicing safe sex. ?Taking low-dose aspirin every day. ?Taking vitamin and mineral supplements as recommended by your health care provider. ?What happens during an annual well check? ?The services and screenings done by your health care provider during your annual well check will depend on your age, overall health, lifestyle risk factors, and  family history of disease. ?Counseling  ?Your health care provider may ask you questions about your: ?Alcohol use. ?Tobacco use. ?Drug use. ?Emotional well-being. ?Home and relationship well-being. ?Sexual activity. ?Eating habits. ?History of falls. ?Memory and ability to understand (cognition). ?Work and work Statistician. ?Reproductive health. ?Screening  ?You may have the following tests or measurements: ?Height, weight, and BMI. ?Blood pressure. ?Lipid and cholesterol levels. These may be checked every 5 years, or more frequently if you are over 13 years old. ?Skin check. ?Lung cancer screening. You may have this screening every year starting at age 38 if you have a 30-pack-year history of smoking and currently smoke or have quit within the past 15 years. ?Fecal occult blood test (FOBT) of the stool. You may have this test every year starting at age 63. ?Flexible sigmoidoscopy or colonoscopy. You may have a sigmoidoscopy every 5 years or a colonoscopy every 10 years starting at age 72. ?Hepatitis C blood test. ?Hepatitis B blood test. ?Sexually transmitted disease (STD) testing. ?Diabetes screening. This is done by checking your blood sugar (glucose) after you have not eaten for a while (fasting). You may have this done every 1-3 years. ?Bone density scan. This is done to screen for osteoporosis. You may have this done starting at age 23. ?Mammogram. This may be done every 1-2 years. Talk to your health care provider about how often you should have regular mammograms. ?Talk with your health care provider about your test results, treatment options, and if necessary, the need for more tests. ?Vaccines  ?Your health care provider may recommend certain vaccines, such as: ?Influenza vaccine. This is recommended every year. ?Tetanus, diphtheria, and acellular pertussis (Tdap, Td) vaccine. You may need a Td booster every 10 years. ?Zoster vaccine. You may need this after age 42. ?Pneumococcal 13-valent conjugate  (  PCV13) vaccine. One dose is recommended after age 61. ?Pneumococcal polysaccharide (PPSV23) vaccine. One dose is recommended after age 86. ?Talk to your health care provider about which screenings and vaccines you need and how often you need them. ?This information is not intended to replace advice given to you by your health care provider. Make sure you discuss any questions you have with your health care provider. ?Document Released: 09/06/2015 Document Revised: 04/29/2016 Document Reviewed: 06/11/2015 ?Elsevier Interactive Patient Education ? 2017 [place]. ? ?Fall Prevention in the Home ?Falls can cause injuries. They can happen to people of all ages. There are many things you can do to make your home safe and to help prevent falls. ?What can I do on the outside of my home? ?Regularly fix the edges of walkways and driveways and fix any cracks. ?Remove anything that might make you trip as you walk through a door, such as a raised step or threshold. ?Trim any bushes or trees on the path to your home. ?Use bright outdoor lighting. ?Clear any walking paths of anything that might make someone trip, such as rocks or tools. ?Regularly check to see if handrails are loose or broken. Make sure that both sides of any steps have handrails. ?Any raised decks and porches should have guardrails on the edges. ?Have any leaves, snow, or ice cleared regularly. ?Use sand or salt on walking paths during winter. ?Clean up any spills in your garage right away. This includes oil or grease spills. ?What can I do in the bathroom? ?Use night lights. ?Install grab bars by the toilet and in the tub and shower. Do not use towel bars as grab bars. ?Use non-skid mats or decals in the tub or shower. ?If you need to sit down in the shower, use a plastic, non-slip stool. ?Keep the floor dry. Clean up any water that spills on the floor as soon as it happens. ?Remove soap buildup in the tub or shower regularly. ?Attach bath mats securely with  double-sided non-slip rug tape. ?Do not have throw rugs and other things on the floor that can make you trip. ?What can I do in the bedroom? ?Use night lights. ?Make sure that you have a light by your bed that is easy to reach. ?Do not use any sheets or blankets that are too big for your bed. They should not hang down onto the floor. ?Have a firm chair that has side arms. You can use this for support while you get dressed. ?Do not have throw rugs and other things on the floor that can make you trip. ?What can I do in the kitchen? ?Clean up any spills right away. ?Avoid walking on wet floors. ?Keep items that you use a lot in easy-to-reach places. ?If you need to reach something above you, use a strong step stool that has a grab bar. ?Keep electrical cords out of the way. ?Do not use floor polish or wax that makes floors slippery. If you must use wax, use non-skid floor wax. ?Do not have throw rugs and other things on the floor that can make you trip. ?What can I do with my stairs? ?Do not leave any items on the stairs. ?Make sure that there are handrails on both sides of the stairs and use them. Fix handrails that are broken or loose. Make sure that handrails are as long as the stairways. ?Check any carpeting to make sure that it is firmly attached to the stairs. Fix any carpet that is loose  or worn. ?Avoid having throw rugs at the top or bottom of the stairs. If you do have throw rugs, attach them to the floor with carpet tape. ?Make sure that you have a light switch at the top of the stairs and the bottom of the stairs. If you do not have them, ask someone to add them for you. ?What else can I do to help prevent falls? ?Wear shoes that: ?Do not have high heels. ?Have rubber bottoms. ?Are comfortable and fit you well. ?Are closed at the toe. Do not wear sandals. ?If you use a stepladder: ?Make sure that it is fully opened. Do not climb a closed stepladder. ?Make sure that both sides of the stepladder are locked  into place. ?Ask someone to hold it for you, if possible. ?Clearly mark and make sure that you can see: ?Any grab bars or handrails. ?First and last steps. ?Where the edge of each step is. ?Use tools that help

## 2021-11-17 NOTE — Progress Notes (Signed)
? ?Subjective:  ? Danielle Taylor is a 76 y.o. female who presents for Medicare Annual (Subsequent) preventive examination. ? ?Virtual Visit via Telephone Note ? ?I connected with  Danielle Taylor [facility] on 11/17/21 at 10:00 AM EDT by telephone and verified that I am speaking with the correct person using two identifiers. ? ?Location: ?Patient: home ?Provider: [facility] ?Persons participating in the virtual visit: patient/Nurse Health Advisor ?  ?I discussed the limitations, risks, security and privacy concerns of performing an evaluation and management service by telephone and the availability of in person appointments. The patient expressed understanding and agreed to proceed. ? ?Interactive audio and video telecommunications were attempted between this nurse and patient, however failed, due to patient having technical difficulties OR patient did not have access to video capability.  We continued and completed visit with audio only. ? ?Some vital signs may be absent or patient reported.  ? ?Clemetine Marker, LPN ? ? ?Review of Systems    ? ?Cardiac Risk Factors include: advanced age (>32mn, >>63women);hypertension;dyslipidemia;obesity (BMI >30kg/m2) ? ?   ?Objective:  ?  ?There were no vitals filed for this visit. ?There is no height or weight on file to calculate BMI. ? ? ?  11/17/2021  ? 10:08 AM 07/14/2021  ?  7:54 AM 11/06/2020  ? 10:17 AM 01/25/2019  ? 11:47 AM 06/29/2018  ?  5:00 PM 06/23/2018  ?  1:42 PM 11/15/2017  ? 11:31 AM  ?Advanced Directives  ?Does Patient Have a Medical Advance Directive? Yes Yes Yes Yes Yes Yes No  ?Type of [place]amed[place]of [place] will [place] will [place] will Living will;Healthcare Power of [place] will   ?Does patient want to make changes to medical advance directive?     No - Patient declined    ?Copy of [place]  Chart? Yes - validated most recent copy scanned in chart (See row information)  Yes - validated most recent copy scanned in chart (See row information) Yes - validated most recent copy scanned in chart (See row information) No - copy requested No - copy requested   ?Would patient like information on creating a medical advance directive?       Yes (MAU/Ambulatory/Procedural Areas - Information given)  ? ? ?Current Medications (verified) ?Outpatient Encounter Medications as of 11/17/2021  ?Medication Sig  ? acetaminophen (TYLENOL) 500 MG tablet Take 1,000 mg by mouth every 8 (eight) hours as needed (pain).  ? albuterol (PROVENTIL) (2.5 MG/3ML) 0.083% nebulizer solution Take 3 mLs (2.5 mg total) by nebulization every 6 (six) hours as needed for wheezing or shortness of breath.  ? citalopram (CELEXA) 40 MG tablet Take 1 tablet (40 mg total) by mouth daily.  ? Fluticasone-Umeclidin-Vilant (TRELEGY ELLIPTA) 100-62.5-25 MCG/ACT AEPB Inhale 1 Inhaler into the lungs daily.  ? gabapentin (NEURONTIN) 300 MG capsule Take 600 mg by mouth 3 (three) times daily. [place]  ? gemfibrozil (LOPID) 600 MG tablet Take 1 tablet (600 mg total) by mouth daily.  ? HYDROcodone bit-homatropine (HYCODAN) 5-1.5 MG/5ML syrup Take 5 mLs by mouth every 6 (six) hours as needed.  ? levothyroxine (SYNTHROID) 112 MCG tablet Take 1 tablet (112 mcg total) by mouth daily.  ? loratadine (CLARITIN) 10 MG tablet Take 1 tablet (10 mg total) by mouth daily.  ? losartan (COZAAR) 25 MG tablet Take 2 tablets (50 mg total) by mouth daily.  ? metoprolol succinate (TOPROL-XL)  50 MG 24 hr tablet Take 1 tablet (50 mg total) by mouth daily. TAKE WITH OR IMMEDIATELY FOLLOWING A MEAL.  ? montelukast (SINGULAIR) 10 MG tablet Take 1 tablet daily  ? Multiple Vitamins-Minerals (CENTRUM SILVER 50+WOMEN PO) Take 1 tablet by mouth daily.  ? nystatin cream (MYCOSTATIN) Apply 1 application topically 2 (two) times daily.  ? predniSONE (DELTASONE) 5 MG tablet Take 5 mg by mouth  daily.  ? rivaroxaban (XARELTO) 20 MG TABS tablet Take 20 mg by mouth daily with supper. Cardiologist [facility]  ? simvastatin (ZOCOR) 20 MG tablet Take 1 tablet (20 mg total) by mouth daily.  ? [DISCONTINUED] bacitracin-neomycin-polymyxin-hydrocortisone (CORTISPORIN) 1 % ophthalmic ointment Place 1 application into the left eye 3 (three) times daily.  ? [DISCONTINUED] ipratropium (ATROVENT) 0.02 % nebulizer solution Take 1.25 mLs (0.25 mg total) by nebulization every 6 (six) hours as needed for wheezing or shortness of breath.  ? [DISCONTINUED] nystatin cream (MYCOSTATIN) Apply 1 application topically 2 (two) times daily.  ? ?Facility-Administered Encounter Medications as of 11/17/2021  ?Medication  ? albuterol (PROVENTIL) (2.5 MG/3ML) 0.083% nebulizer solution 2.5 mg  ? ipratropium-albuterol (DUONEB) 0.5-2.5 (3) MG/3ML nebulizer solution 3 mL  ? ? ?Allergies (verified) ?Baclofen and Penicillins  ? ?History: ?Past Medical History:  ?Diagnosis Date  ? Allergy   ? Chronic gastritis   ? Chronic low back pain with sciatica   ? COPD (chronic obstructive pulmonary disease) ([place])   ? Depression   ? Diverticulosis   ? Dysrhythmia   ? Complete Heart Block  ? Gastritis, chronic   ? GERD (gastroesophageal reflux disease)   ? Graves disease   ? H/O hepatitis   ? Hallux valgus of right foot   ? Hammer toe   ? History of hiatal hernia   ? History of shingles   ? Hyperlipidemia   ? Hypertension   ? Hypothyroidism   ? Incontinence   ? bladder and bowel  ? Paroxysmal atrial fibrillation (HCC)   ? Presence of permanent cardiac pacemaker   ? 2012  ? Stroke [facility])   ? ?Past Surgical History:  ?Procedure Laterality Date  ? APPENDECTOMY    ? COLONOSCOPY  08/24/1998  ? COLONOSCOPY WITH PROPOFOL N/A 04/26/2015  ? Procedure: COLONOSCOPY WITH PROPOFOL;  Surgeon: Hulen Luster, MD;  Location: [facility] ENDOSCOPY;  Service: Gastroenterology;  Laterality: N/A;  ? COLONOSCOPY WITH PROPOFOL N/A 07/14/2021  ? Procedure: COLONOSCOPY WITH PROPOFOL;  Surgeon:  [name], DO;  [facility] END[facility]COPY;  Service: Gastroenterology;  Laterality: N/A;  ? EYE SURGERY    ? fibroid tumors    ? fingers and wrist  ? FOOT SURGERY    ? TONSILLECTOMY    ? TRANSFORAMINAL LUMBAR INTERBODY FUSION (TLIF) WITH PEDICLE SCREW FIXATION 1 LEVEL N/A 06/29/2018  ? Procedure: TRANSFORAMINAL LUMBAR INTERBODY FUSION (TLIF) WITH PEDICLE SCREW FIXATION 1 LEVEL;  Surgeon: [name], MD;  Location: [facility] ORS;  Service: Neurosurgery;  Laterality: N/A;  ? VAGINAL HYSTERECTOMY    ? ?Family History  ?Problem Relation Age of Onset  ? Dementia Mother   ? Heart disease Father   ? Diabetes Sister   ? Breast cancer Neg Hx   ? ?Social History  ? ?Socioeconomic History  ? Marital status: Married  ?  Spouse name: Not on file  ? Number of children: 2  ? Years of education: Not on file  ? Highest education level: 12th grade  ?Occupational History  ? Occupation: Retired  ?Tobacco Use  ? Smoking status:  Former  ?  Packs/day: 0.50  ?  Years: 33.00  ?  Pack years: 16.50  ?  Types: Cigarettes  ?  Quit date: 2003  ?  Years since quitting: 20.2  ? Smokeless tobacco: Never  ? Tobacco comments:  ?  smoking cessation materials not required  ?Vaping Use  ? Vaping Use: Never used  ?Substance and Sexual Activity  ? Alcohol use: Yes  ?  Alcohol/week: 1.0 standard drink  ?  Types: 1 Glasses of wine per week  ?  Comment: occassional  ? Drug use: Never  ? Sexual activity: Not Currently  ?Other Topics Concern  ? Not on file  ?Social History Narrative  ? Not on file  ? ?Social Determinants of Health  ? ?Financial Resource Strain: Low Risk   ? Difficulty of Paying Living Expenses: Not hard at all  ?Food Insecurity: No Food Insecurity  ? Worried About Charity fundraiser in the Last Year: Never true  ? Ran Out of Food in the Last Year: Never true  ?Transportation Needs: No Transportation Needs  ? Lack of Transportation (Medical): No  ? Lack of Transportation (Non-Medical): No  ?Physical Activity: Inactive  ? Days of  Exercise per Week: 0 days  ? Minutes of Exercise per Session: 0 min  ?Stress: No Stress Concern Present  ? Feeling of Stress : Not at all  ?Social Connections: Moderately Integrated  ? Frequency of Communicat

## 2021-11-27 ENCOUNTER — Telehealth: Payer: Self-pay

## 2021-11-27 NOTE — Telephone Encounter (Signed)
Scheduled mammo for [place] on May 17th @ 130- pt notified ?

## 2021-12-19 DIAGNOSIS — R053 Chronic cough: Secondary | ICD-10-CM | POA: Diagnosis not present

## 2021-12-19 DIAGNOSIS — R0602 Shortness of breath: Secondary | ICD-10-CM | POA: Diagnosis not present

## 2022-01-07 ENCOUNTER — Inpatient Hospital Stay: Admission: RE | Admit: 2022-01-07 | Payer: Medicare Other | Source: Ambulatory Visit

## 2022-01-16 DIAGNOSIS — I442 Atrioventricular block, complete: Secondary | ICD-10-CM | POA: Diagnosis not present

## 2022-01-16 DIAGNOSIS — Z45018 Encounter for adjustment and management of other part of cardiac pacemaker: Secondary | ICD-10-CM | POA: Diagnosis not present

## 2022-01-16 DIAGNOSIS — Z7901 Long term (current) use of anticoagulants: Secondary | ICD-10-CM | POA: Diagnosis not present

## 2022-01-16 DIAGNOSIS — I498 Other specified cardiac arrhythmias: Secondary | ICD-10-CM | POA: Diagnosis not present

## 2022-01-16 DIAGNOSIS — Z950 Presence of cardiac pacemaker: Secondary | ICD-10-CM | POA: Diagnosis not present

## 2022-01-16 DIAGNOSIS — I4891 Unspecified atrial fibrillation: Secondary | ICD-10-CM | POA: Diagnosis not present

## 2022-02-13 DIAGNOSIS — Z950 Presence of cardiac pacemaker: Secondary | ICD-10-CM | POA: Diagnosis not present

## 2022-02-25 DIAGNOSIS — R053 Chronic cough: Secondary | ICD-10-CM | POA: Diagnosis not present

## 2022-02-25 DIAGNOSIS — R7981 Abnormal blood-gas level: Secondary | ICD-10-CM | POA: Diagnosis not present

## 2022-02-25 DIAGNOSIS — J432 Centrilobular emphysema: Secondary | ICD-10-CM | POA: Diagnosis not present

## 2022-03-04 DIAGNOSIS — R053 Chronic cough: Secondary | ICD-10-CM | POA: Diagnosis not present

## 2022-03-14 ENCOUNTER — Other Ambulatory Visit: Payer: Self-pay | Admitting: Family Medicine

## 2022-03-14 DIAGNOSIS — B3789 Other sites of candidiasis: Secondary | ICD-10-CM

## 2022-03-14 DIAGNOSIS — R21 Rash and other nonspecific skin eruption: Secondary | ICD-10-CM

## 2022-04-09 ENCOUNTER — Ambulatory Visit: Payer: Medicare Other | Admitting: Family Medicine

## 2022-04-13 ENCOUNTER — Encounter: Payer: Self-pay | Admitting: Family Medicine

## 2022-04-13 ENCOUNTER — Telehealth (INDEPENDENT_AMBULATORY_CARE_PROVIDER_SITE_OTHER): Payer: Medicare Other | Admitting: Family Medicine

## 2022-04-13 VITALS — Ht 65.0 in

## 2022-04-13 DIAGNOSIS — J849 Interstitial pulmonary disease, unspecified: Secondary | ICD-10-CM | POA: Diagnosis not present

## 2022-04-13 DIAGNOSIS — R051 Acute cough: Secondary | ICD-10-CM

## 2022-04-13 DIAGNOSIS — U071 COVID-19: Secondary | ICD-10-CM | POA: Diagnosis not present

## 2022-04-13 MED ORDER — MOLNUPIRAVIR EUA 200MG CAPSULE: 4.0000 | ORAL_CAPSULE | Freq: Two times a day (BID) | ORAL | 0 refills | Status: AC

## 2022-04-13 MED ORDER — GUAIFENESIN-CODEINE 100-10 MG/5ML PO SYRP: 5.0000 mL | ORAL_SOLUTION | Freq: Three times a day (TID) | ORAL | 0 refills | Status: DC | PRN

## 2022-04-13 NOTE — Progress Notes (Signed)
Date:  04/13/2022   Name:  Danielle Taylor   DOB:  03-Dec-1945   MRN:  [id number]   Chief Complaint: Covid Positive (X 2 days, Positive yesterday with at home test, cough green mucous,runny nose, headache, body aches, unknown fever, nausea)  I, Elvera Lennox my office connected with this patient, , by telephone at the patient's Danielle Taylor at her home.  I verified that I am speaking with the correct person using two identifiers. This visit was conducted via telephone due to the Covid-19 outbreak from my office at [facility] in [place], [place]. I discussed the limitations, risks, security and privacy concerns of performing an evaluation and management service by telephone. I also discussed with the patient that there may be a patient responsible charge related to this service. The patient expressed understanding and agreed to proceed.      Lab Results  Component Value Date   NA 140 10/09/2021   K 4.5 10/09/2021   CO2 24 10/09/2021   GLUCOSE 84 10/09/2021   BUN 20 10/09/2021   CREATININE 0.94 10/09/2021   CALCIUM 9.2 10/09/2021   EGFR 64 10/09/2021   GFRNONAA 64 09/25/2020   Lab Results  Component Value Date   CHOL 190 10/09/2021   HDL 64 10/09/2021   LDLCALC 106 (H) 10/09/2021   TRIG 114 10/09/2021   CHOLHDL 3.5 02/08/2017   Lab Results  Component Value Date   TSH 0.732 10/09/2021   Lab Results  Component Value Date   HGBA1C 6.3 (H) 03/11/2016   Lab Results  Component Value Date   WBC 11.0 (H) 02/26/2021   HGB 11.3 (L) 02/26/2021   HCT 35.8 (L) 02/26/2021   MCV 86.3 02/26/2021   PLT 387 02/26/2021   Lab Results  Component Value Date   ALT 17 10/09/2021   AST 19 10/09/2021   ALKPHOS 80 10/09/2021   BILITOT 0.5 10/09/2021   No results found for: "25OHVITD2", "25OHVITD3", "VD25OH"   Review of Systems  Constitutional:  Positive for chills and diaphoresis. Negative for fever.  HENT:  Positive for congestion, postnasal drip, rhinorrhea, sinus  pressure and sneezing.   Respiratory:  Positive for cough and shortness of breath. Negative for choking, chest tightness and wheezing.     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   Lumbar radiculopathy 06/29/2018   Chronic low back pain with sciatica 02/15/2018   Reactive airway disease, mild intermittent, uncomplicated 92/08/69   Chronic seasonal allergic rhinitis due to pollen 08/10/2017   Mixed hyperlipidemia 08/10/2017   Depression 08/10/2017   Class 1 obesity due to excess calories without serious comorbidity with body mass index (BMI) of 34.0 to 34.9 in adult 08/10/2017   Chronic cough 06/15/2017   Interstitial lung disease ([place]) 06/15/2017   Centrilobular emphysema ([place]) 04/02/2017   Lumbar disc disease 04/02/2017   Hepatic steatosis 04/02/2017   Atherosclerosis of coronary artery of native heart 04/02/2017   Cardiac pacemaker in situ 01/06/2017   Paroxysmal A-fib ([place]) 03/30/2016   Syncope and collapse 03/11/2016   Right leg weakness 03/11/2016   Essential hypertension 03/11/2016   Hyperglycemia 03/11/2016   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     Penicillins Other (See Comments)    Has patient had a PCN reaction causing immediate rash, facial/tongue/throat swelling, SOB or lightheadedness with hypotension: No Has patient had a PCN reaction causing severe rash involving mucus membranes or skin necrosis: No Has patient had a PCN reaction that required hospitalization No Has patient had a PCN reaction occurring within the last 10 years: No If all of the above answers are "NO", then may proceed with Cephalosporin use.     Past Surgical History:  Procedure Laterality Date   APPENDECTOMY     COLONOSCOPY  08/24/1998   COLONOSCOPY WITH PROPOFOL N/A 04/26/2015   Procedure: COLONOSCOPY WITH PROPOFOL;  Surgeon: Hulen Luster, MD;  Location: [facility] ENDOSCOPY;  Service: Gastroenterology;  Laterality: N/A;   COLONOSCOPY WITH PROPOFOL N/A 07/14/2021   Procedure: COLONOSCOPY WITH PROPOFOL;  Surgeon: [name], DO;  Location: [facility] ENDOSCOPY;  Service: Gastroenterology;  Laterality: N/A;   EYE SURGERY     fibroid tumors     fingers and wrist   FOOT SURGERY     TONSILLECTOMY     TRANSFORAMINAL LUMBAR INTERBODY FUSION (TLIF) WITH PEDICLE SCREW FIXATION 1 LEVEL N/A 06/29/2018   Procedure: TRANSFORAMINAL LUMBAR INTERBODY FUSION (TLIF) WITH PEDICLE SCREW FIXATION 1 LEVEL;  Surgeon: [name], MD;  Location: [facility] ORS;  Service: Neurosurgery;  Laterality: N/A;   VAGINAL HYSTERECTOMY      Social History   Tobacco Use   Smoking status: Former    Packs/day: 0.50    Years: 33.00    Total pack years: 16.50    Types: Cigarettes    Quit date: 2003    Years since quitting: 20.6   Smokeless tobacco: Never   Tobacco comments:    smoking cessation materials not required  Vaping Use   Vaping Use: Never used  Substance Use Topics   Alcohol use: Yes     Alcohol/week: 1.0 standard drink of alcohol    Types: 1 Glasses of wine per week    Comment: occassional   Drug use: Never     Medication list has been reviewed and updated.  Current Meds  Medication Sig   acetaminophen (TYLENOL) 500 MG tablet Take 1,000 mg by mouth every 8 (eight) hours as needed (pain).   albuterol (PROVENTIL) (2.5 MG/3ML) 0.083% nebulizer solution Take 3 mLs (2.5 mg total) by nebulization every 6 (six) hours as needed for wheezing or shortness of breath.   citalopram (CELEXA) 40 MG tablet Take 1 tablet (40 mg total) by mouth daily.   Fluticasone-Umeclidin-Vilant (TRELEGY ELLIPTA) 100-62.5-25 MCG/ACT AEPB Inhale 1 Inhaler into the lungs daily.   gabapentin (NEURONTIN) 300 MG capsule Take 600 mg by mouth 3 (three) times daily. [place]   gemfibrozil (LOPID) 600 MG tablet Take 1 tablet (600 mg total) by mouth daily.   HYDROcodone bit-homatropine (HYCODAN) 5-1.5 MG/5ML syrup Take 5 mLs by mouth every 6 (six) hours as needed.   levothyroxine (SYNTHROID) 112 MCG tablet  Take 1 tablet (112 mcg total) by mouth daily.   loratadine (CLARITIN) 10 MG tablet Take 1 tablet (10 mg total) by mouth daily.   losartan (COZAAR) 25 MG tablet Take 2 tablets (50 mg total) by mouth daily.   metoprolol succinate (TOPROL-XL) 50 MG 24 hr tablet Take 1 tablet (50 mg total) by mouth daily. TAKE WITH OR IMMEDIATELY FOLLOWING A MEAL.   montelukast (SINGULAIR) 10 MG tablet Take 1 tablet daily   Multiple Vitamins-Minerals (CENTRUM SILVER 50+WOMEN PO) Take 1 tablet by mouth daily.   nystatin cream (MYCOSTATIN) APPLY TO AFFECTED AREA TWICE A DAY   predniSONE (DELTASONE) 5 MG tablet Take 5 mg by mouth daily.   rivaroxaban (XARELTO) 20 MG TABS tablet Take 20 mg by mouth daily with supper. Cardiologist [facility]   simvastatin (ZOCOR) 20 MG tablet Take 1 tablet (20 mg total) by mouth daily.   Current Facility-Administered Medications for the 04/13/22 encounter (Video Visit) with Juline Patch, MD  Medication    albuterol (PROVENTIL) (2.5 MG/3ML) 0.083% nebulizer solution 2.5 mg   ipratropium-albuterol (DUONEB) 0.5-2.5 (3) MG/3ML nebulizer solution 3 mL       04/13/2022    2:38 PM 10/09/2021   10:27 AM 03/26/2021   10:09 AM 09/25/2020   10:44 AM  GAD 7 : Generalized Anxiety Score  Nervous, Anxious, on Edge 0 0 0 0  Control/stop worrying 0 0 0 0  Worry too much - different things 0 0 0 0  Trouble relaxing 0 0 0 0  Restless 0 0 0 0  Easily annoyed or irritable 0 0 0 0  Afraid - awful might happen 0 0 0 0  Total GAD 7 Score 0 0 0 0  Anxiety Difficulty Not difficult at all Not difficult at all         04/13/2022    2:38 PM 11/17/2021   10:07 AM 10/09/2021   10:27 AM  Depression screen PHQ 2/9  Decreased Interest 0 0 0  Down, Depressed, Hopeless 0 0 0  PHQ - 2 Score 0 0 0  Altered sleeping 0 0 0  Tired, decreased energy 0 0 0  Change in appetite 0 0 0  Feeling bad or failure about yourself  0 0 0  Trouble concentrating 0 0 0  Moving slowly or fidgety/restless 0 0 0  Suicidal thoughts 0 0 0  PHQ-9 Score 0 0 0  Difficult doing work/chores Not difficult at all Not difficult at all Not difficult at all    BP Readings from Last 3 Encounters:  10/09/21 122/80  09/03/21 130/80  07/14/21 138/73    Physical Exam Nursing note reviewed.     Wt Readings from Last 3 Encounters:  10/09/21 196 lb (88.9 kg)  09/03/21 187 lb (84.8 kg)  07/14/21 185 lb (83.9 kg)    Ht 5' 5"  (1.651 m)   BMI 32.62 kg/m   Assessment and Plan:  1. COVID New onset.  Patient tested positive yesterday for COVID.  Manifested as cough and congestion.  Patient had available some maneuver.  That spouse was taking that she took a dose beginning yesterday evening and likely this morning.  We will proceed with her own prescription that she will continue at 200 mg 4 to tablets twice a day 5 days. - molnupiravir EUA (LAGEVRIO) 200 mg CAPS capsule; Take 4 capsules (800 mg total) by mouth 2 (two) times daily for 5 days.   Dispense: 40 capsule; Refill: 0  2. Acute cough New onset  cough.  Persistent.  Will treat with Robitussin-AC teaspoon to 8 hours as needed cough. - guaiFENesin-codeine (ROBITUSSIN AC) 100-10 MG/5ML syrup; Take 5 mLs by mouth 3 (three) times daily as needed for cough.  Dispense: 118 mL; Refill: 0  3. Interstitial lung disease ([place]) .  Chronic.  Relatively stable.  Patient is followed by Dr. Raul Del pulmonary.  Patient has mild to moderate COPD with likely some aspect of interstitial lung disease.  Patient does have oxygen at home and she has been encouraged to wear.  Patient should continue her Trelegy and albuterol but we will hold on prednisone right now and that lungs improved to 94% on oxygen if this starts decreasing then patient has been instructed to go to the emergency room  I spent 10 minutes with this patient, More than 50% of that time was spent in face to face education, counseling and care coordination.  Otilio Miu, MD

## 2022-04-14 ENCOUNTER — Ambulatory Visit: Payer: Medicare Other | Admitting: Family Medicine

## 2022-04-19 ENCOUNTER — Other Ambulatory Visit: Payer: Self-pay | Admitting: Family Medicine

## 2022-04-19 DIAGNOSIS — J301 Allergic rhinitis due to pollen: Secondary | ICD-10-CM

## 2022-04-19 DIAGNOSIS — J4520 Mild intermittent asthma, uncomplicated: Secondary | ICD-10-CM

## 2022-04-20 NOTE — Telephone Encounter (Signed)
Requested Prescriptions  Pending Prescriptions Disp Refills  . montelukast (SINGULAIR) 10 MG tablet [Pharmacy Med Name: MONTELUKAST SOD 10 MG TABLET] 90 tablet 1    Sig: TAKE 1 TABLET BY MOUTH EVERY DAY     Pulmonology:  Leukotriene Inhibitors Passed - 04/19/2022  9:44 AM      Passed - Valid encounter within last 12 months    Recent Outpatient Visits          1 week ago [facility] [place], Deanna C, MD   6 months ago Essential (primary) hypertension   [facility] [place], Deanna C, MD   7 months ago Cellulitis of left upper eyelid   [facility] [place], Deanna C, MD   1 year ago Essential (primary) hypertension   [facility] [place], Deanna C, MD   1 year ago Acute maxillary sinusitis, recurrence not specified   [facility] [place], [place], MD      Future Appointments            In 1 week Juline Patch, MD [facility], [facility]

## 2022-04-22 DIAGNOSIS — Z961 Presence of intraocular lens: Secondary | ICD-10-CM | POA: Diagnosis not present

## 2022-04-29 ENCOUNTER — Ambulatory Visit (INDEPENDENT_AMBULATORY_CARE_PROVIDER_SITE_OTHER): Payer: Medicare Other | Admitting: Family Medicine

## 2022-04-29 ENCOUNTER — Encounter: Payer: Self-pay | Admitting: Family Medicine

## 2022-04-29 VITALS — BP 120/80 | HR 78 | Ht 65.0 in | Wt 190.0 lb

## 2022-04-29 DIAGNOSIS — Z23 Encounter for immunization: Secondary | ICD-10-CM | POA: Diagnosis not present

## 2022-04-29 DIAGNOSIS — E782 Mixed hyperlipidemia: Secondary | ICD-10-CM | POA: Diagnosis not present

## 2022-04-29 DIAGNOSIS — J4520 Mild intermittent asthma, uncomplicated: Secondary | ICD-10-CM | POA: Diagnosis not present

## 2022-04-29 DIAGNOSIS — E039 Hypothyroidism, unspecified: Secondary | ICD-10-CM | POA: Diagnosis not present

## 2022-04-29 DIAGNOSIS — F32A Depression, unspecified: Secondary | ICD-10-CM

## 2022-04-29 DIAGNOSIS — J301 Allergic rhinitis due to pollen: Secondary | ICD-10-CM | POA: Diagnosis not present

## 2022-04-29 DIAGNOSIS — I10 Essential (primary) hypertension: Secondary | ICD-10-CM | POA: Diagnosis not present

## 2022-04-29 DIAGNOSIS — E7849 Other hyperlipidemia: Secondary | ICD-10-CM

## 2022-04-29 MED ORDER — LORATADINE 10 MG PO TABS: 10.0000 mg | ORAL_TABLET | Freq: Every day | ORAL | 1 refills | Status: DC

## 2022-04-29 MED ORDER — LEVOTHYROXINE SODIUM 112 MCG PO TABS: 112.0000 ug | ORAL_TABLET | Freq: Every day | ORAL | 1 refills | Status: DC

## 2022-04-29 MED ORDER — MONTELUKAST SODIUM 10 MG PO TABS: ORAL_TABLET | ORAL | 1 refills | Status: DC

## 2022-04-29 MED ORDER — GEMFIBROZIL 600 MG PO TABS: 600.0000 mg | ORAL_TABLET | Freq: Every day | ORAL | 1 refills | Status: DC

## 2022-04-29 MED ORDER — METOPROLOL SUCCINATE ER 50 MG PO TB24: 50.0000 mg | ORAL_TABLET | Freq: Every day | ORAL | 1 refills | Status: DC

## 2022-04-29 MED ORDER — LOSARTAN POTASSIUM 25 MG PO TABS: 50.0000 mg | ORAL_TABLET | Freq: Every day | ORAL | 1 refills | Status: DC

## 2022-04-29 MED ORDER — SIMVASTATIN 20 MG PO TABS: 20.0000 mg | ORAL_TABLET | Freq: Every day | ORAL | 1 refills | Status: DC

## 2022-04-29 MED ORDER — CITALOPRAM HYDROBROMIDE 40 MG PO TABS: 40.0000 mg | ORAL_TABLET | Freq: Every day | ORAL | 1 refills | Status: DC

## 2022-04-29 NOTE — Progress Notes (Signed)
Date:  04/29/2022   Name:  Danielle Taylor   DOB:  Dec 22, 1945   MRN:  [id number]   Chief Complaint: Allergic Rhinitis , Hyperlipidemia, Hypertension, Hypothyroidism, Depression, and Flu Vaccine  Hyperlipidemia This is a chronic problem. The current episode started more than 1 year ago. The problem is controlled. Recent lipid tests were reviewed and are normal. Pertinent negatives include no chest pain, myalgias or shortness of breath. Current antihyperlipidemic treatment includes statins. The current treatment provides mild improvement of lipids. There are no compliance problems.  Risk factors for coronary artery disease include dyslipidemia and hypertension.  Hypertension This is a chronic problem. The problem has been gradually improving since onset. The problem is controlled. Pertinent negatives include no blurred vision, chest pain, headaches, palpitations or shortness of breath. There are no associated agents to hypertension. There are no known risk factors for coronary artery disease. Past treatments include angiotensin blockers and beta blockers. The current treatment provides moderate improvement. There are no compliance problems.  Identifiable causes of hypertension include a thyroid problem.  Depression        This is a chronic problem.  The current episode started more than 1 year ago.   The problem has been gradually improving since onset.  Associated symptoms include no decreased concentration, no fatigue, no helplessness, no hopelessness, does not have insomnia, not irritable, no restlessness, no decreased interest, no appetite change, no body aches, no myalgias, no headaches, no indigestion, not sad and no suicidal ideas.  Past treatments include SSRIs - Selective serotonin reuptake inhibitors.  Compliance with treatment is good.  Previous treatment provided mild relief.  Past medical history includes thyroid problem.   Thyroid Problem Presents for follow-up visit. Patient  reports no constipation, fatigue or palpitations. The symptoms have been stable. Her past medical history is significant for hyperlipidemia.    Lab Results  Component Value Date   NA 140 10/09/2021   K 4.5 10/09/2021   CO2 24 10/09/2021   GLUCOSE 84 10/09/2021   BUN 20 10/09/2021   CREATININE 0.94 10/09/2021   CALCIUM 9.2 10/09/2021   EGFR 64 10/09/2021   GFRNONAA 64 09/25/2020   Lab Results  Component Value Date   CHOL 190 10/09/2021   HDL 64 10/09/2021   LDLCALC 106 (H) 10/09/2021   TRIG 114 10/09/2021   CHOLHDL 3.5 02/08/2017   Lab Results  Component Value Date   TSH 0.732 10/09/2021   Lab Results  Component Value Date   HGBA1C 6.3 (H) 03/11/2016   Lab Results  Component Value Date   WBC 11.0 (H) 02/26/2021   HGB 11.3 (L) 02/26/2021   HCT 35.8 (L) 02/26/2021   MCV 86.3 02/26/2021   PLT 387 02/26/2021   Lab Results  Component Value Date   ALT 17 10/09/2021   AST 19 10/09/2021   ALKPHOS 80 10/09/2021   BILITOT 0.5 10/09/2021   No results found for: "25OHVITD2", "25OHVITD3", "VD25OH"   Review of Systems  Constitutional:  Negative for appetite change and fatigue.  HENT:  Negative for trouble swallowing.   Eyes:  Negative for blurred vision.  Respiratory:  Negative for shortness of breath and wheezing.   Cardiovascular:  Negative for chest pain, palpitations and leg swelling.  Gastrointestinal:  Negative for blood in stool and constipation.  Endocrine: Negative for polydipsia and polyuria.  Musculoskeletal:  Negative for arthralgias and myalgias.  Neurological:  Negative for headaches.  Psychiatric/Behavioral:  Positive for depression. Negative for decreased concentration and suicidal ideas. The  patient does not have insomnia.     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   Lumbar radiculopathy 06/29/2018   Chronic low back pain with sciatica 02/15/2018   Reactive airway disease, mild intermittent, uncomplicated 24/26/8341   Chronic seasonal allergic rhinitis due to pollen 08/10/2017   Mixed hyperlipidemia 08/10/2017   Depression 08/10/2017   Class 1 obesity due to excess calories without serious comorbidity with body mass index (BMI) of 34.0 to 34.9 in adult 08/10/2017   Chronic cough 06/15/2017   Interstitial lung disease ([place]) 06/15/2017   Centrilobular emphysema ([place]) 04/02/2017   Lumbar disc disease 04/02/2017   Hepatic steatosis 04/02/2017   Atherosclerosis of coronary artery of native heart 04/02/2017   Cardiac pacemaker in situ 01/06/2017   Paroxysmal A-fib ([place]) 03/30/2016   Syncope and collapse 03/11/2016   Right leg weakness 03/11/2016   Essential hypertension 03/11/2016   Hyperglycemia 03/11/2016   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     Penicillins Other (See Comments)    Has patient had a PCN reaction causing immediate rash, facial/tongue/throat swelling, SOB or lightheadedness with hypotension: No Has patient had a  PCN reaction causing severe rash involving mucus membranes or skin necrosis: No Has patient had a PCN reaction that required hospitalization No Has patient had a PCN reaction occurring within the last 10 years: No If all of the above answers are "NO", then may proceed with Cephalosporin use.     Past Surgical History:  Procedure Laterality Date   APPENDECTOMY     COLONOSCOPY  08/24/1998   COLONOSCOPY WITH PROPOFOL N/A 04/26/2015   Procedure: COLONOSCOPY WITH PROPOFOL;  Surgeon: Hulen Luster, MD;  Location[facility] ENDOSCOPY;  Service: Gastroenterology;  Laterality: N/A;   COLONOSCOPY WITH PROPOFOL N/A 07/14/2021   Procedure: COLONOSCOPY WITH PROPOFOL;  Surgeon: [name], DO;  Location[facility] ENDOSCOPY;  Service: Gastroenterology;  Laterality: N/A;   EYE SURGERY     fibroid tumors     fingers and wrist   FOOT SURGERY     TONSILLECTOMY     TRANSFORAMINAL LUMBAR INTERBODY FUSION (TLIF) WITH PEDICLE SCREW FIXATION 1 LEVEL N/A 06/29/2018   Procedure: TRANSFORAMINAL LUMBAR INTERBODY FUSION (TLIF) WITH PEDICLE SCREW FIXATION 1 LEVEL;  Surgeon: [name], MD;  Location: [facility] ORS;  Service: Neurosurgery;  Laterality: N/A;   VAGINAL HYSTERECTOMY      Social History   Tobacco Use   Smoking status: Former    Packs/day: 0.50    Years: 33.00    Total pack years: 16.50    Types: Cigarettes    Quit date: 2003    Years since quitting: 20.6  Smokeless tobacco: Never   Tobacco comments:    smoking cessation materials not required  Vaping Use   Vaping Use: Never used  Substance Use Topics   Alcohol use: Yes    Alcohol/week: 1.0 standard drink of alcohol    Types: 1 Glasses of wine per week    Comment: occassional   Drug use: Never     Medication list has been reviewed and updated.  Current Meds  Medication Sig   acetaminophen (TYLENOL) 500 MG tablet Take 1,000 mg by mouth every 8 (eight) hours as needed (pain).   albuterol (PROVENTIL) (2.5 MG/3ML) 0.083% nebulizer  solution Take 3 mLs (2.5 mg total) by nebulization every 6 (six) hours as needed for wheezing or shortness of breath.   citalopram (CELEXA) 40 MG tablet Take 1 tablet (40 mg total) by mouth daily.   Fluticasone-Umeclidin-Vilant (TRELEGY ELLIPTA) 100-62.5-25 MCG/ACT AEPB Inhale 1 Inhaler into the lungs daily.   gabapentin (NEURONTIN) 300 MG capsule Take 600 mg by mouth 3 (three) times daily. [place]   gemfibrozil (LOPID) 600 MG tablet Take 1 tablet (600 mg total) by mouth daily.   levothyroxine (SYNTHROID) 112 MCG tablet Take 1 tablet (112 mcg total) by mouth daily.   loratadine (CLARITIN) 10 MG tablet Take 1 tablet (10 mg total) by mouth daily.   losartan (COZAAR) 25 MG tablet Take 2 tablets (50 mg total) by mouth daily.   metoprolol succinate (TOPROL-XL) 50 MG 24 hr tablet Take 1 tablet (50 mg total) by mouth daily. TAKE WITH OR IMMEDIATELY FOLLOWING A MEAL.   montelukast (SINGULAIR) 10 MG tablet TAKE 1 TABLET BY MOUTH EVERY DAY   Multiple Vitamins-Minerals (CENTRUM SILVER 50+WOMEN PO) Take 1 tablet by mouth daily.   nystatin cream (MYCOSTATIN) APPLY TO AFFECTED AREA TWICE A DAY   predniSONE (DELTASONE) 5 MG tablet Take 5 mg by mouth daily.   rivaroxaban (XARELTO) 20 MG TABS tablet Take 20 mg by mouth daily with supper. Cardiologist [facility]   simvastatin (ZOCOR) 20 MG tablet Take 1 tablet (20 mg total) by mouth daily.   Current Facility-Administered Medications for the 04/29/22 encounter (Office Visit) with Juline Patch, MD  Medication   albuterol (PROVENTIL) (2.5 MG/3ML) 0.083% nebulizer solution 2.5 mg   ipratropium-albuterol (DUONEB) 0.5-2.5 (3) MG/3ML nebulizer solution 3 mL       04/29/2022    9:48 AM 04/13/2022    2:38 PM 10/09/2021   10:27 AM 03/26/2021   10:09 AM  GAD 7 : Generalized Anxiety Score  Nervous, Anxious, on Edge 0 0 0 0  Control/stop worrying 0 0 0 0  Worry too much - different things 0 0 0 0  Trouble relaxing 0 0 0 0  Restless 0 0 0 0  Easily annoyed or irritable 0  0 0 0  Afraid - awful might happen 0 0 0 0  Total GAD 7 Score 0 0 0 0  Anxiety Difficulty Not difficult at all Not difficult at all Not difficult at all        04/29/2022    9:48 AM 04/13/2022    2:38 PM 11/17/2021   10:07 AM  Depression screen PHQ 2/9  Decreased Interest 0 0 0  Down, Depressed, Hopeless 0 0 0  PHQ - 2 Score 0 0 0  Altered sleeping 0 0 0  Tired, decreased energy 0 0 0  Change in appetite 0 0 0  Feeling bad or failure about yourself  0 0 0  Trouble concentrating 0 0 0  Moving slowly or fidgety/restless 0 0 0  Suicidal thoughts 0 0 0  PHQ-9 Score 0 0 0  Difficult doing work/chores Not difficult at all Not difficult at all Not difficult at all    BP Readings from Last 3 Encounters:  04/29/22 120/80  10/09/21 122/80  09/03/21 130/80    Physical Exam Vitals and nursing note reviewed. Exam conducted with a chaperone present.  Constitutional:      General: She is not irritable.She is not in acute distress.    Appearance: She is not diaphoretic.  HENT:     Head: Normocephalic and atraumatic.     Right Ear: Tympanic membrane and external ear normal.     Left Ear: Tympanic membrane and external ear normal.     Nose: Nose normal.     Mouth/Throat:     Mouth: Mucous membranes are moist.  Eyes:     General:        Right eye: No discharge.        Left eye: No discharge.     Conjunctiva/sclera: Conjunctivae normal.     Pupils: Pupils are equal, round, and reactive to light.  Neck:     Thyroid: No thyroid mass, thyromegaly or thyroid tenderness.     Vascular: No JVD.  Cardiovascular:     Rate and Rhythm: Normal rate and regular rhythm.     Heart sounds: Normal heart sounds, S1 normal and S2 normal. No murmur heard.    No systolic murmur is present.     No diastolic murmur is present.     No friction rub. No gallop. No S3 or S4 sounds.  Pulmonary:     Effort: Pulmonary effort is normal.     Breath sounds: Normal breath sounds. No wheezing or rhonchi.   Abdominal:     General: Bowel sounds are normal.     Palpations: Abdomen is soft. There is no mass.     Tenderness: There is no abdominal tenderness. There is no guarding.  Musculoskeletal:        General: Normal range of motion.     Cervical back: Normal range of motion and neck supple.  Lymphadenopathy:     Cervical: No cervical adenopathy.  Skin:    General: Skin is warm and dry.  Neurological:     Mental Status: She is alert.     Wt Readings from Last 3 Encounters:  04/29/22 190 lb (86.2 kg)  10/09/21 196 lb (88.9 kg)  09/03/21 187 lb (84.8 kg)    BP 120/80   Pulse 78   Ht 5' 5"  (1.651 m)   Wt 190 lb (86.2 kg)   BMI 31.62 kg/m   Assessment and Plan:  1. Depression, unspecified depression type Chronic.  Persistent.  Stable.  PHQ is 0 Gad score is 0 continue citalopram 40 mg once a day. - citalopram (CELEXA) 40 MG tablet; Take 1 tablet (40 mg total) by mouth daily.  Dispense: 90 tablet; Refill: 1  2. Mixed hyperlipidemia Chronic.  Controlled.  Stable.  Will check lipid panel for current status of LDL.  Continuance of gemfibrozil 600 mg once a day and simvastatin 20 mg once a day. - gemfibrozil (LOPID) 600 MG tablet; Take 1 tablet (600 mg total) by mouth daily.  Dispense: 90 tablet; Refill: 1 - simvastatin (ZOCOR) 20 MG tablet; Take 1 tablet (20 mg total) by mouth daily.  Dispense: 90 tablet; Refill: 1 - Lipid Panel With LDL/HDL Ratio  3. Familial multiple lipoprotein-type hyperlipidemia As  noted above - gemfibrozil (LOPID) 600 MG tablet; Take 1 tablet (600 mg total) by mouth daily.  Dispense: 90 tablet; Refill: 1  4. Hypothyroidism, unspecified type Chronic.  Controlled.  Stable.  Denies fatigue.  Check thyroid panel with TSH and if appropriate will continue levothyroxine 112 mcg daily. - levothyroxine (SYNTHROID) 112 MCG tablet; Take 1 tablet (112 mcg total) by mouth daily.  Dispense: 90 tablet; Refill: 1 - Thyroid Panel With TSH  5. Chronic seasonal allergic  rhinitis due to pollen Chronic.  Controlled.  Stable.  Continue loratadine and Singulair 10 mg once a day. - loratadine (CLARITIN) 10 MG tablet; Take 1 tablet (10 mg total) by mouth daily.  Dispense: 90 tablet; Refill: 1 - montelukast (SINGULAIR) 10 MG tablet; TAKE 1 TABLET BY MOUTH EVERY DAY  Dispense: 90 tablet; Refill: 1  6. Essential (primary) hypertension Chronic.  Controlled.  Stable.  Blood pressure 120/80.  Asymptomatic.  Tolerating medications well.  Continue losartan 25 mg 2 tablets daily and metoprolol XL 50 mg once a day. - losartan (COZAAR) 25 MG tablet; Take 2 tablets (50 mg total) by mouth daily.  Dispense: 180 tablet; Refill: 1 - metoprolol succinate (TOPROL-XL) 50 MG 24 hr tablet; Take 1 tablet (50 mg total) by mouth daily. TAKE WITH OR IMMEDIATELY FOLLOWING A MEAL.  Dispense: 90 tablet; Refill: 1  7. Reactive airway disease, mild intermittent, uncomplicated Chronic.  Controlled.  Stable.  Continue Singulair 10 mg as needed. - montelukast (SINGULAIR) 10 MG tablet; TAKE 1 TABLET BY MOUTH EVERY DAY  Dispense: 90 tablet; Refill: 1    Otilio Miu, MD

## 2022-04-30 LAB — THYROID PANEL WITH TSH
Free Thyroxine Index: 1.9 (ref 1.2–4.9)
T3 Uptake Ratio: 29 % (ref 24–39)
T4, Total: 6.6 ug/dL (ref 4.5–12.0)
TSH: 2.58 u[IU]/mL (ref 0.450–4.500)

## 2022-04-30 LAB — LIPID PANEL WITH LDL/HDL RATIO
Cholesterol, Total: 189 mg/dL (ref 100–199)
HDL: 49 mg/dL (ref >39)
LDL Chol Calc (NIH): 109 mg/dL — ABNORMAL HIGH (ref 0–99)
LDL/HDL Ratio: 2.2 ratio (ref 0.0–3.2)
Triglycerides: 179 mg/dL — ABNORMAL HIGH (ref 0–149)
VLDL Cholesterol Cal: 31 mg/dL (ref 5–40)

## 2022-05-28 DIAGNOSIS — J449 Chronic obstructive pulmonary disease, unspecified: Secondary | ICD-10-CM | POA: Diagnosis not present

## 2022-05-28 DIAGNOSIS — Z950 Presence of cardiac pacemaker: Secondary | ICD-10-CM | POA: Diagnosis not present

## 2022-05-28 DIAGNOSIS — Z8709 Personal history of other diseases of the respiratory system: Secondary | ICD-10-CM | POA: Diagnosis not present

## 2022-05-28 DIAGNOSIS — J411 Mucopurulent chronic bronchitis: Secondary | ICD-10-CM | POA: Diagnosis not present

## 2022-05-28 DIAGNOSIS — R059 Cough, unspecified: Secondary | ICD-10-CM | POA: Diagnosis not present

## 2022-05-28 DIAGNOSIS — J9801 Acute bronchospasm: Secondary | ICD-10-CM | POA: Diagnosis not present

## 2022-07-22 DIAGNOSIS — Z23 Encounter for immunization: Secondary | ICD-10-CM | POA: Diagnosis not present

## 2022-07-28 ENCOUNTER — Ambulatory Visit
Admission: RE | Admit: 2022-07-28 | Discharge: 2022-07-28 | Disposition: A | Discharge status: Home / Self Care | Payer: Medicare Other | Source: Ambulatory Visit | Attending: Family Medicine | Admitting: Family Medicine

## 2022-07-28 ENCOUNTER — Other Ambulatory Visit
Admission: RE | Admit: 2022-07-28 | Discharge: 2022-07-28 | Disposition: A | Discharge status: Home / Self Care | Payer: Medicare Other | Source: Home / Self Care | Attending: Family Medicine | Admitting: Family Medicine

## 2022-07-28 ENCOUNTER — Encounter: Payer: Self-pay | Admitting: Family Medicine

## 2022-07-28 ENCOUNTER — Ambulatory Visit (INDEPENDENT_AMBULATORY_CARE_PROVIDER_SITE_OTHER): Payer: Medicare Other | Admitting: Family Medicine

## 2022-07-28 ENCOUNTER — Ambulatory Visit
Admission: RE | Admit: 2022-07-28 | Discharge: 2022-07-28 | Disposition: A | Discharge status: Home / Self Care | Payer: Medicare Other | Attending: Family Medicine | Admitting: Family Medicine

## 2022-07-28 VITALS — BP 128/78 | HR 76 | Ht 65.0 in | Wt 190.0 lb

## 2022-07-28 DIAGNOSIS — J432 Centrilobular emphysema: Secondary | ICD-10-CM | POA: Diagnosis not present

## 2022-07-28 DIAGNOSIS — J849 Interstitial pulmonary disease, unspecified: Secondary | ICD-10-CM | POA: Diagnosis not present

## 2022-07-28 DIAGNOSIS — R051 Acute cough: Secondary | ICD-10-CM

## 2022-07-28 DIAGNOSIS — J411 Mucopurulent chronic bronchitis: Secondary | ICD-10-CM

## 2022-07-28 DIAGNOSIS — R059 Cough, unspecified: Secondary | ICD-10-CM | POA: Diagnosis not present

## 2022-07-28 LAB — CBC WITH DIFFERENTIAL/PLATELET
Abs Immature Granulocytes: 0.08 10*3/uL — ABNORMAL HIGH (ref 0.00–0.07)
Basophils Absolute: 0.1 10*3/uL (ref 0.0–0.1)
Basophils Relative: 1 %
Eosinophils Absolute: 0.2 10*3/uL (ref 0.0–0.5)
Eosinophils Relative: 1 %
HCT: 41.9 % (ref 36.0–46.0)
Hemoglobin: 13.4 g/dL (ref 12.0–15.0)
Immature Granulocytes: 1 %
Lymphocytes Relative: 10 %
Lymphs Abs: 1.5 10*3/uL (ref 0.7–4.0)
MCH: 30 pg (ref 26.0–34.0)
MCHC: 32 g/dL (ref 30.0–36.0)
MCV: 93.7 fL (ref 80.0–100.0)
Monocytes Absolute: 0.8 10*3/uL (ref 0.1–1.0)
Monocytes Relative: 6 %
Neutro Abs: 12.4 10*3/uL — ABNORMAL HIGH (ref 1.7–7.7)
Neutrophils Relative %: 81 %
Platelets: 309 10*3/uL (ref 150–400)
RBC: 4.47 MIL/uL (ref 3.87–5.11)
RDW: 15.6 % — ABNORMAL HIGH (ref 11.5–15.5)
WBC: 15.1 10*3/uL — ABNORMAL HIGH (ref 4.0–10.5)
nRBC: 0 % (ref 0.0–0.2)

## 2022-07-28 MED ORDER — GUAIFENESIN-CODEINE 100-10 MG/5ML PO SYRP: 5.0000 mL | ORAL_SOLUTION | Freq: Three times a day (TID) | ORAL | 0 refills | Status: DC | PRN

## 2022-07-28 MED ORDER — LEVOFLOXACIN 500 MG PO TABS: 500.0000 mg | ORAL_TABLET | Freq: Every day | ORAL | 0 refills | Status: AC

## 2022-07-28 NOTE — Progress Notes (Signed)
Date:  07/28/2022   Name:  Danielle Taylor   DOB:  10/19/1945   MRN:  [id number]   Chief Complaint: Cough (And congestion- had chest xray on 10/5 with Dr Raul Del- per pt he said it was "ok". After breathing treatment- some light green production)  Cough This is a new problem. The current episode started in the past 7 days. The problem has been waxing and waning. The cough is Productive of purulent sputum. Associated symptoms include a fever and shortness of breath. Pertinent negatives include no chills, nasal congestion or postnasal drip. Nothing aggravates the symptoms. She has tried oral steroids, leukotriene antagonists and OTC cough suppressant for the symptoms. The treatment provided mild relief. Her past medical history is significant for COPD.    Lab Results  Component Value Date   NA 140 10/09/2021   K 4.5 10/09/2021   CO2 24 10/09/2021   GLUCOSE 84 10/09/2021   BUN 20 10/09/2021   CREATININE 0.94 10/09/2021   CALCIUM 9.2 10/09/2021   EGFR 64 10/09/2021   GFRNONAA 64 09/25/2020   Lab Results  Component Value Date   CHOL 189 04/29/2022   HDL 49 04/29/2022   LDLCALC 109 (H) 04/29/2022   TRIG 179 (H) 04/29/2022   CHOLHDL 3.5 02/08/2017   Lab Results  Component Value Date   TSH 2.580 04/29/2022   Lab Results  Component Value Date   HGBA1C 6.3 (H) 03/11/2016   Lab Results  Component Value Date   WBC 11.0 (H) 02/26/2021   HGB 11.3 (L) 02/26/2021   HCT 35.8 (L) 02/26/2021   MCV 86.3 02/26/2021   PLT 387 02/26/2021   Lab Results  Component Value Date   ALT 17 10/09/2021   AST 19 10/09/2021   ALKPHOS 80 10/09/2021   BILITOT 0.5 10/09/2021   No results found for: "25OHVITD2", "25OHVITD3", "VD25OH"   Review of Systems  Constitutional:  Positive for fever. Negative for chills.  HENT:  Negative for postnasal drip.   Respiratory:  Positive for cough and shortness of breath.     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   Lumbar radiculopathy 06/29/2018   Chronic low back pain with sciatica 02/15/2018   Reactive airway disease, mild intermittent, uncomplicated 47/42/5956   Chronic seasonal allergic rhinitis due to pollen 08/10/2017   Mixed hyperlipidemia 08/10/2017   Depression 08/10/2017   Class 1 obesity due to excess calories without serious comorbidity with body mass index (BMI) of 34.0 to 34.9 in adult 08/10/2017   Chronic cough 06/15/2017   Interstitial lung disease ([place]) 06/15/2017   Centrilobular emphysema ([place]) 04/02/2017   Lumbar disc disease 04/02/2017   Hepatic steatosis 04/02/2017   Atherosclerosis of coronary artery of native heart 04/02/2017   Cardiac pacemaker in situ 01/06/2017   Paroxysmal A-fib ([place]) 03/30/2016   Syncope and collapse 03/11/2016   Right leg weakness 03/11/2016   Essential hypertension 03/11/2016   Hyperglycemia 03/11/2016   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     Penicillins Other (See Comments)     Has patient had a PCN reaction causing immediate rash, facial/tongue/throat swelling, SOB or lightheadedness with hypotension: No Has patient had a PCN reaction causing severe rash involving mucus membranes or skin necrosis: No Has patient had a PCN reaction that required hospitalization No Has patient had a PCN reaction occurring within the last 10 years: No If all of the above answers are "NO", then may proceed with Cephalosporin use.     Past Surgical History:  Procedure Laterality Date   APPENDECTOMY     COLONOSCOPY  08/24/1998   COLONOSCOPY WITH PROPOFOL N/A 04/26/2015   Procedure: COLONOSCOPY WITH PROPOFOL;  Surgeon: Hulen Luster, MD;  Location: [facility] ENDOSCOPY;  Service: Gastroenterology;  Laterality: N/A;   COLONOSCOPY WITH PROPOFOL N/A 07/14/2021   Procedure: COLONOSCOPY WITH PROPOFOL;  Surgeon: [name], DO;  Location: [facility] ENDOSCOPY;  Service: Gastroenterology;  Laterality: N/A;   EYE SURGERY     fibroid tumors     fingers and wrist   FOOT SURGERY     TONSILLECTOMY     TRANSFORAMINAL LUMBAR INTERBODY FUSION (TLIF) WITH PEDICLE SCREW FIXATION 1 LEVEL N/A 06/29/2018   Procedure: TRANSFORAMINAL LUMBAR INTERBODY FUSION (TLIF) WITH PEDICLE SCREW FIXATION 1 LEVEL;  Surgeon: [name], MD;  Location: [facility] ORS;  Service: Neurosurgery;  Laterality: N/A;   VAGINAL HYSTERECTOMY      Social History   Tobacco Use   Smoking status: Former    Packs/day: 0.50    Years: 33.00    Total pack years: 16.50    Types: Cigarettes    Quit date: 2003    Years since quitting: 20.9   Smokeless tobacco: Never   Tobacco comments:    smoking cessation materials not required  Vaping Use   Vaping Use: Never used  Substance Use Topics   Alcohol use: Yes    Alcohol/week: 1.0 standard drink of alcohol    Types: 1 Glasses of wine per week    Comment: occassional   Drug use: Never     Medication list has been reviewed and updated.  Current Meds  Medication Sig    acetaminophen (TYLENOL) 500 MG tablet Take 1,000 mg by mouth every 8 (eight) hours as needed (pain).   albuterol (PROVENTIL) (2.5 MG/3ML) 0.083% nebulizer solution Take 3 mLs (2.5 mg total) by nebulization every 6 (six) hours as needed for wheezing or shortness of breath.   citalopram (CELEXA) 40 MG tablet Take 1 tablet (40 mg total) by mouth daily.   Fluticasone-Umeclidin-Vilant (TRELEGY ELLIPTA) 100-62.5-25 MCG/ACT AEPB Inhale 1 Inhaler into the lungs daily.   gabapentin (NEURONTIN) 300 MG capsule Take 600 mg by mouth 3 (three) times daily. [place]   gemfibrozil (LOPID) 600 MG tablet Take 1 tablet (600 mg total) by mouth daily.   levothyroxine (SYNTHROID) 112 MCG tablet Take 1 tablet (112 mcg total) by mouth daily.   loratadine (CLARITIN) 10 MG tablet Take 1 tablet (10 mg total) by mouth daily.   losartan (COZAAR) 25 MG tablet Take 2 tablets (50 mg total) by mouth daily.   metoprolol succinate (TOPROL-XL) 50 MG 24 hr tablet Take  1 tablet (50 mg total) by mouth daily. TAKE WITH OR IMMEDIATELY FOLLOWING A MEAL.   montelukast (SINGULAIR) 10 MG tablet TAKE 1 TABLET BY MOUTH EVERY DAY   Multiple Vitamins-Minerals (CENTRUM SILVER 50+WOMEN PO) Take 1 tablet by mouth daily.   nystatin cream (MYCOSTATIN) APPLY TO AFFECTED AREA TWICE A DAY   predniSONE (DELTASONE) 10 MG tablet Take 10 mg by mouth daily.   rivaroxaban (XARELTO) 20 MG TABS tablet Take 20 mg by mouth daily with supper. Cardiologist [facility]   simvastatin (ZOCOR) 20 MG tablet Take 1 tablet (20 mg total) by mouth daily.   Current Facility-Administered Medications for the 07/28/22 encounter (Office Visit) with Juline Patch, MD  Medication   albuterol (PROVENTIL) (2.5 MG/3ML) 0.083% nebulizer solution 2.5 mg   ipratropium-albuterol (DUONEB) 0.5-2.5 (3) MG/3ML nebulizer solution 3 mL       07/28/2022    9:23 AM 04/29/2022    9:48 AM 04/13/2022    2:38 PM 10/09/2021   10:27 AM  GAD 7 : Generalized Anxiety Score  Nervous, Anxious, on Edge 0  0 0 0  Control/stop worrying 0 0 0 0  Worry too much - different things 0 0 0 0  Trouble relaxing 0 0 0 0  Restless 0 0 0 0  Easily annoyed or irritable 0 0 0 0  Afraid - awful might happen 0 0 0 0  Total GAD 7 Score 0 0 0 0  Anxiety Difficulty Not difficult at all Not difficult at all Not difficult at all Not difficult at all       07/28/2022    9:23 AM 04/29/2022    9:48 AM 04/13/2022    2:38 PM  Depression screen PHQ 2/9  Decreased Interest 0 0 0  Down, Depressed, Hopeless 0 0 0  PHQ - 2 Score 0 0 0  Altered sleeping 0 0 0  Tired, decreased energy 0 0 0  Change in appetite 0 0 0  Feeling bad or failure about yourself  0 0 0  Trouble concentrating 0 0 0  Moving slowly or fidgety/restless 0 0 0  Suicidal thoughts 0 0 0  PHQ-9 Score 0 0 0  Difficult doing work/chores Not difficult at all Not difficult at all Not difficult at all    BP Readings from Last 3 Encounters:  07/28/22 128/78  04/29/22 120/80  10/09/21 122/80    Physical Exam Vitals and nursing note reviewed. Exam conducted with a chaperone present.  Constitutional:      General: She is not in acute distress.    Appearance: She is not diaphoretic.  HENT:     Head: Normocephalic and atraumatic.     Right Ear: Tympanic membrane and external ear normal.     Left Ear: Tympanic membrane and external ear normal.     Nose: Nose normal.     Right Sinus: No maxillary sinus tenderness or frontal sinus tenderness.     Left Sinus: No maxillary sinus tenderness or frontal sinus tenderness.  Eyes:     General:        Right eye: No discharge.        Left eye: No discharge.     Conjunctiva/sclera: Conjunctivae normal.     Pupils: Pupils are equal, round, and reactive to light.  Neck:     Thyroid: No thyromegaly.     Vascular: No JVD.  Cardiovascular:     Rate and Rhythm: Normal rate and regular rhythm.     Heart sounds: Normal  heart sounds, S1 normal and S2 normal. No murmur heard.    No systolic murmur is present.      No diastolic murmur is present.     No friction rub. No gallop. No S3 or S4 sounds.  Pulmonary:     Effort: Pulmonary effort is normal.     Breath sounds: Normal breath sounds. No wheezing, rhonchi or rales.  Abdominal:     General: Bowel sounds are normal.     Palpations: Abdomen is soft. There is no mass.     Tenderness: There is no abdominal tenderness. There is no guarding.  Musculoskeletal:        General: Normal range of motion.     Cervical back: Normal range of motion and neck supple.  Lymphadenopathy:     Cervical: No cervical adenopathy.  Skin:    General: Skin is warm and dry.  Neurological:     Mental Status: She is alert.     Deep Tendon Reflexes: Reflexes are normal and symmetric.     Wt Readings from Last 3 Encounters:  07/28/22 190 lb (86.2 kg)  04/29/22 190 lb (86.2 kg)  10/09/21 196 lb (88.9 kg)    BP 128/78   Pulse 76   Ht _0  (1.651 m)   Wt 190 lb (86.2 kg)   SpO2 95%   BMI 31.62 kg/m   Assessment and Plan:  1. Bronchitis, mucopurulent recurrent (HCC) Chronic.  Episodic.  Patient has recurrent bronchitis of her mucopurulent nature.  She is followed by pulmonary.  We will obtain a chest x-ray and on pulmonary review before the report is available it does not look like that there is a pneumonia but we will wait for the final review in the meantime we will initiate levofloxacin 500 mg once a day and will treat with Robitussin AC a teaspoon every 6-8 hours as needed cough.  CBC was done and it was noted that patient has an elevated white count of 15,100.  There is a slight shift of the neutrophils.  This may reflect the current dosing of steroid that she is on for pulmonary. - DG Chest 2 View; Future - guaiFENesin-codeine (ROBITUSSIN AC) 100-10 MG/5ML syrup; Take 5 mLs by mouth 3 (three) times daily as needed for cough.  Dispense: 118 mL; Refill: 0 - levofloxacin (LEVAQUIN) 500 MG tablet; Take 1 tablet (500 mg total) by mouth daily for 7 days.  Dispense:  7 tablet; Refill: 0  2. Centrilobular emphysema (HCC) Chronic.  Followed by pulmonary.  Will check pulse ox is 95%.  There are breath sounds in all areas.  I do think that there may be an exacerbation of her lung disease and that we will treat for the infection and if necessary refer. - DG Chest 2 View; Future - levofloxacin (LEVAQUIN) 500 MG tablet; Take 1 tablet (500 mg total) by mouth daily for 7 days.  Dispense: 7 tablet; Refill: 0  3. Interstitial lung disease ([place]) .  Followed by pulmonary.  We will obtain chest x-ray to make sure there is been no significant progression. - DG Chest 2 View; Future - levofloxacin (LEVAQUIN) 500 MG tablet; Take 1 tablet (500 mg total) by mouth daily for 7 days.  Dispense: 7 tablet; Refill: 0  4. Acute cough Does have acute cough particularly bothersome at night.  I would suggest that we take Mucinex DM during the day and Robitussin AC at night. - DG Chest 2 View; Future - guaiFENesin-codeine (ROBITUSSIN AC) 100-10 MG/5ML  syrup; Take 5 mLs by mouth 3 (three) times daily as needed for cough.  Dispense: 118 mL; Refill: 0    Otilio Miu, MD

## 2022-07-29 ENCOUNTER — Telehealth: Payer: Self-pay | Admitting: Family Medicine

## 2022-07-29 NOTE — Telephone Encounter (Signed)
Copied from [place] [id number]. Topic: General - Other >> Jul 29, 2022  9:32 AM Cyndi Bender wrote: Reason for CRM: Pt requests that [name] return her call to go over lab results. Cb# [phone]

## 2022-07-30 ENCOUNTER — Telehealth: Payer: Self-pay | Admitting: Family Medicine

## 2022-07-30 NOTE — Telephone Encounter (Signed)
Copied from [place] [id number]. Topic: General - Other >> Jul 29, 2022  9:32 AM Cyndi Bender wrote: Reason for CRM: Pt requests that [name] return her call to go over lab results. Cb# [phone] >> Jul 30, 2022 11:22 AM Chapman Fitch wrote: Pt was advised yesterday to call if she hasn't heard back from [name] about her chest xray / please advise / pt asked if [name] can give her a call

## 2022-08-28 ENCOUNTER — Encounter: Payer: Self-pay | Admitting: Family Medicine

## 2022-08-28 ENCOUNTER — Ambulatory Visit (INDEPENDENT_AMBULATORY_CARE_PROVIDER_SITE_OTHER): Payer: Medicare Other | Admitting: Family Medicine

## 2022-08-28 VITALS — BP 100/60 | HR 60 | Temp 97.7°F | Ht 65.0 in | Wt 188.0 lb

## 2022-08-28 DIAGNOSIS — R110 Nausea: Secondary | ICD-10-CM | POA: Diagnosis not present

## 2022-08-28 DIAGNOSIS — R1011 Right upper quadrant pain: Secondary | ICD-10-CM

## 2022-08-28 MED ORDER — ONDANSETRON HCL 4 MG PO TABS: 4.0000 mg | ORAL_TABLET | Freq: Three times a day (TID) | ORAL | 1 refills | Status: AC | PRN

## 2022-08-28 MED ORDER — PANTOPRAZOLE SODIUM 40 MG PO TBEC: 40.0000 mg | DELAYED_RELEASE_TABLET | Freq: Every day | ORAL | 3 refills | Status: DC

## 2022-08-28 NOTE — Progress Notes (Signed)
Date:  08/28/2022   Name:  Danielle Taylor   DOB:  1946-07-20   MRN:  [id number]   Chief Complaint: follow up vomiting (Started vomiting day before she went on cruise. Cruise ship gave her ondansetron and omeprazole pills after giving IV pantoprazole and ondansetron pt started to feel better. She refused hospital care at [facility])  Emesis  This is a new problem. The current episode started 1 to 4 weeks ago (approx 2 weeks ago). Episode frequency: no emesis now only nausea. The problem has been gradually improving. The emesis has an appearance of bile. There has been no fever. Associated symptoms include abdominal pain. Pertinent negatives include no chest pain, chills, coughing, diarrhea, fever or weight loss. Risk factors: none prior to cruise. Treatments tried: iv pantoprazole/omeprazole/zofran. The treatment provided mild relief.  Abdominal Pain This is a new problem. The current episode started 1 to 4 weeks ago. The problem occurs intermittently. The pain is located in the RUQ. The quality of the pain is colicky. The abdominal pain does not radiate. Associated symptoms include nausea and vomiting. Pertinent negatives include no constipation, diarrhea, fever, hematochezia, hematuria or weight loss. Associated symptoms comments: Dark stools on cruise/ now "darker brown". She has tried proton pump inhibitors for the symptoms. The treatment provided moderate relief.    Lab Results  Component Value Date   NA 140 10/09/2021   K 4.5 10/09/2021   CO2 24 10/09/2021   GLUCOSE 84 10/09/2021   BUN 20 10/09/2021   CREATININE 0.94 10/09/2021   CALCIUM 9.2 10/09/2021   EGFR 64 10/09/2021   GFRNONAA 64 09/25/2020   Lab Results  Component Value Date   CHOL 189 04/29/2022   HDL 49 04/29/2022   LDLCALC 109 (H) 04/29/2022   TRIG 179 (H) 04/29/2022   CHOLHDL 3.5 02/08/2017   Lab Results  Component Value Date   TSH 2.580 04/29/2022   Lab Results  Component Value Date   HGBA1C 6.3 (H)  03/11/2016   Lab Results  Component Value Date   WBC 15.1 (H) 07/28/2022   HGB 13.4 07/28/2022   HCT 41.9 07/28/2022   MCV 93.7 07/28/2022   PLT 309 07/28/2022   Lab Results  Component Value Date   ALT 17 10/09/2021   AST 19 10/09/2021   ALKPHOS 80 10/09/2021   BILITOT 0.5 10/09/2021   No results found for: "25OHVITD2", "25OHVITD3", "VD25OH"   Review of Systems  Constitutional:  Positive for fatigue. Negative for chills, fever and weight loss.  Respiratory:  Negative for cough, chest tightness, shortness of breath and wheezing.   Cardiovascular:  Negative for chest pain, palpitations and leg swelling.  Gastrointestinal:  Positive for abdominal pain, nausea and vomiting. Negative for abdominal distention, anal bleeding, blood in stool, constipation, diarrhea and hematochezia.  Genitourinary:  Negative for hematuria.    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   Lumbar radiculopathy 06/29/2018   Chronic low back pain with sciatica 02/15/2018   Reactive airway disease, mild intermittent, uncomplicated 29/92/4268   Chronic seasonal allergic rhinitis due to pollen 08/10/2017   Mixed hyperlipidemia 08/10/2017   Depression 08/10/2017   Class 1 obesity due to  excess calories without serious comorbidity with body mass index (BMI) of 34.0 to 34.9 in adult 08/10/2017   Chronic cough 06/15/2017   Interstitial lung disease ([place]) 06/15/2017   Centrilobular emphysema ([place]) 04/02/2017   Lumbar disc disease 04/02/2017   Hepatic steatosis 04/02/2017   Atherosclerosis of coronary artery of native  heart 04/02/2017   Cardiac pacemaker in situ 01/06/2017   Paroxysmal A-fib ([place]) 03/30/2016   Syncope and collapse 03/11/2016   Right leg weakness 03/11/2016   Essential hypertension 03/11/2016   Hyperglycemia 03/11/2016   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     Penicillins Other (See Comments)    Has patient had a PCN reaction causing immediate rash, facial/tongue/throat swelling, SOB or lightheadedness with hypotension: No Has patient had a PCN reaction causing severe rash involving mucus membranes or skin necrosis: No Has patient had a PCN reaction that required hospitalization No Has patient had a PCN reaction occurring within the last 10 years: No If all of the above answers are "NO", then may proceed with Cephalosporin use.     Past Surgical History:  Procedure Laterality Date   APPENDECTOMY     COLONOSCOPY  08/24/1998   COLONOSCOPY WITH PROPOFOL N/A 04/26/2015   Procedure: COLONOSCOPY WITH PROPOFOL;  Surgeon: Hulen Luster, MD;  Location[facility] ENDOSCOPY;  Service: Gastroenterology;  Laterality: N/A;   COLONOSCOPY WITH PROPOFOL N/A 07/14/2021   Procedure: COLONOSCOPY WITH PROPOFOL;  Surgeon: [name], DO;  Location: [facility] ENDOSCOPY;  Service: Gastroenterology;  Laterality: N/A;   EYE SURGERY     fibroid tumors     fingers and wrist   FOOT SURGERY     TONSILLECTOMY      TRANSFORAMINAL LUMBAR INTERBODY FUSION (TLIF) WITH PEDICLE SCREW FIXATION 1 LEVEL N/A 06/29/2018   Procedure: TRANSFORAMINAL LUMBAR INTERBODY FUSION (TLIF) WITH PEDICLE SCREW FIXATION 1 LEVEL;  Surgeon: [name], MD;  Location: [facility] ORS;  Service: Neurosurgery;  Laterality: N/A;   VAGINAL HYSTERECTOMY      Social History   Tobacco Use   Smoking status: Former    Packs/day: 0.50    Years: 33.00    Total pack years: 16.50    Types: Cigarettes    Quit date: 2003    Years since quitting: 21.0   Smokeless tobacco: Never   Tobacco comments:    smoking cessation materials not required  Vaping Use   Vaping Use: Never used  Substance Use Topics   Alcohol use: Yes    Alcohol/week: 1.0 standard drink of alcohol    Types: 1 Glasses of wine per week    Comment: occassional   Drug use: Never     Medication list has been reviewed and updated.  Current Meds  Medication Sig   acetaminophen (TYLENOL) 500 MG tablet Take 1,000 mg by mouth every 8 (eight) hours as needed (pain).   albuterol (PROVENTIL) (2.5 MG/3ML) 0.083% nebulizer solution Take 3 mLs (2.5 mg total) by nebulization every 6 (six) hours as  needed for wheezing or shortness of breath.   citalopram (CELEXA) 40 MG tablet Take 1 tablet (40 mg total) by mouth daily.   Fluticasone-Umeclidin-Vilant (TRELEGY ELLIPTA) 100-62.5-25 MCG/ACT AEPB Inhale 1 Inhaler into the lungs daily.   gabapentin (NEURONTIN) 300 MG capsule Take 600 mg by mouth 3 (three) times daily. [place]   gemfibrozil (LOPID) 600 MG tablet Take 1 tablet (600 mg total) by mouth daily.   levothyroxine (SYNTHROID) 112 MCG tablet Take 1 tablet (112 mcg total) by mouth daily.   loratadine (CLARITIN) 10 MG tablet Take 1 tablet (10 mg total) by mouth daily.   losartan (COZAAR) 25 MG tablet Take 2 tablets (50 mg total) by mouth daily.   metoprolol succinate (TOPROL-XL) 50 MG 24 hr tablet Take 1 tablet (50 mg total) by mouth daily. TAKE WITH OR IMMEDIATELY  FOLLOWING A MEAL.   montelukast (SINGULAIR) 10 MG tablet TAKE 1 TABLET BY MOUTH EVERY DAY   Multiple Vitamins-Minerals (CENTRUM SILVER 50+WOMEN PO) Take 1 tablet by mouth daily.   nystatin cream (MYCOSTATIN) APPLY TO AFFECTED AREA TWICE A DAY   omeprazole (PRILOSEC) 20 MG capsule Take 20 mg by mouth daily.   ondansetron (ZOFRAN) 4 MG tablet Take 4 mg by mouth every 8 (eight) hours as needed for nausea or vomiting.   rivaroxaban (XARELTO) 20 MG TABS tablet Take 20 mg by mouth daily with supper. Cardiologist [facility]   simvastatin (ZOCOR) 20 MG tablet Take 1 tablet (20 mg total) by mouth daily.   Current Facility-Administered Medications for the 08/28/22 encounter (Office Visit) with Juline Patch, MD  Medication   albuterol (PROVENTIL) (2.5 MG/3ML) 0.083% nebulizer solution 2.5 mg   ipratropium-albuterol (DUONEB) 0.5-2.5 (3) MG/3ML nebulizer solution 3 mL       07/28/2022    9:23 AM 04/29/2022    9:48 AM 04/13/2022    2:38 PM 10/09/2021   10:27 AM  GAD 7 : Generalized Anxiety Score  Nervous, Anxious, on Edge 0 0 0 0  Control/stop worrying 0 0 0 0  Worry too much - different things 0 0 0 0  Trouble relaxing 0 0 0 0  Restless 0 0 0 0  Easily annoyed or irritable 0 0 0 0  Afraid - awful might happen 0 0 0 0  Total GAD 7 Score 0 0 0 0  Anxiety Difficulty Not difficult at all Not difficult at all Not difficult at all Not difficult at all       07/28/2022    9:23 AM 04/29/2022    9:48 AM 04/13/2022    2:38 PM  Depression screen PHQ 2/9  Decreased Interest 0 0 0  Down, Depressed, Hopeless 0 0 0  PHQ - 2 Score 0 0 0  Altered sleeping 0 0 0  Tired, decreased energy 0 0 0  Change in appetite 0 0 0  Feeling bad or failure about yourself  0 0 0  Trouble concentrating 0 0 0  Moving slowly or fidgety/restless 0 0 0  Suicidal thoughts 0 0 0  PHQ-9 Score 0 0 0  Difficult doing work/chores Not difficult at all Not difficult at all Not difficult at all    BP Readings from Last 3 Encounters:   08/28/22 100/60  07/28/22 128/78  04/29/22 120/80    Physical Exam Constitutional:      Appearance: Normal appearance.  HENT:     Right Ear: Tympanic membrane normal.     Left Ear: Tympanic membrane normal.     Nose: Nose  normal.     Mouth/Throat:     Mouth: Mucous membranes are moist.     Pharynx: No pharyngeal swelling, oropharyngeal exudate, posterior oropharyngeal erythema or uvula swelling.     Tonsils: No tonsillar exudate.     Comments: Not pale Cardiovascular:     Heart sounds: S1 normal and S2 normal.     No systolic murmur is present.     No diastolic murmur is present.     No friction rub. No gallop. No S3 or S4 sounds.  Pulmonary:     Breath sounds: No wheezing, rhonchi or rales.  Abdominal:     General: Bowel sounds are normal. There is no distension.     Palpations: There is no hepatomegaly or splenomegaly.     Tenderness: There is abdominal tenderness in the right upper quadrant. There is no guarding or rebound.  Skin:    Coloration: Skin is not jaundiced.     Wt Readings from Last 3 Encounters:  08/28/22 188 lb (85.3 kg)  07/28/22 190 lb (86.2 kg)  04/29/22 190 lb (86.2 kg)    BP 100/60   Pulse 60   Temp 97.7 F (36.5 C) (Oral)   Ht '5\' 5"'$  (1.651 m)   Wt 188 lb (85.3 kg)   BMI 31.28 kg/m   Assessment and Plan:  1. Nausea New onset.  Episodic and intermittent.  Controlled on Zofran and omeprazole.  Earlier patient with nausea which is gradually improving but with we are now in the investigated phase.  First note that weight is stable at 188 only 2 pounds less current than her last noted weight of 190 pounds.  Apparently patient had elevated LFTs but we do not know the exact readings and that they were so elevated.  There is no icteric or jaundice noted.  Will check hepatic function, lipase, CBC and renal function panel.  Will initiate pantoprazole 40 mg once a day.  Patient will have Zofran to take on an as-needed basis and have encouraged not to  take on a regular basis.  Will obtain an ultrasound of the abdomen and the right upper quadrant to assess gallbladder pancreas. - Hepatic Function Panel (6) - Lipase - CBC with Differential/Platelet - Renal Function Panel - pantoprazole (PROTONIX) 40 MG tablet; Take 1 tablet (40 mg total) by mouth daily.  Dispense: 30 tablet; Refill: 3 - ondansetron (ZOFRAN) 4 MG tablet; Take 1 tablet (4 mg total) by mouth every 8 (eight) hours as needed for nausea or vomiting.  Dispense: 20 tablet; Refill: 1 - US Abdomen Limited RUQ (LIVER/GB); Future  2. Right upper quadrant abdominal pain New onset.  Patient has a colicky type discomfort in the right upper quadrant nonradiating.  As noted above there is some concerned about hepatic/pancreatic/upper GI.  We will obtain these lab values first and may add an H. pylori on Monday pending if this is not looking like a more gallbladder hepatic concern.  In the meantime we will initiate 40 mg daily of pantoprazole.  We will obtain Hemoccults x 3 over the weekend. - Hepatic Function Panel (6) - Lipase - CBC with Differential/Platelet - Renal Function Panel - pantoprazole (PROTONIX) 40 MG tablet; Take 1 tablet (40 mg total) by mouth daily.  Dispense: 30 tablet; Refill: 3 - ondansetron (ZOFRAN) 4 MG tablet; Take 1 tablet (4 mg total) by mouth every 8 (eight) hours as needed for nausea or vomiting.  Dispense: 20 tablet; Refill: 1 - US Abdomen Limited RUQ (LIVER/GB); Future  Otilio Miu, MD

## 2022-08-29 LAB — RENAL FUNCTION PANEL
Albumin: 3.8 g/dL (ref 3.8–4.8)
BUN/Creatinine Ratio: 20 (ref 12–28)
BUN: 17 mg/dL (ref 8–27)
CO2: 23 mmol/L (ref 20–29)
Calcium: 9.4 mg/dL (ref 8.7–10.3)
Chloride: 107 mmol/L — ABNORMAL HIGH (ref 96–106)
Creatinine, Ser: 0.87 mg/dL (ref 0.57–1.00)
Glucose: 108 mg/dL — ABNORMAL HIGH (ref 70–99)
Phosphorus: 2.6 mg/dL — ABNORMAL LOW (ref 3.0–4.3)
Potassium: 5.3 mmol/L — ABNORMAL HIGH (ref 3.5–5.2)
Sodium: 143 mmol/L (ref 134–144)
eGFR: 70 mL/min/{1.73_m2} (ref >59)

## 2022-08-29 LAB — HEPATIC FUNCTION PANEL (6)
ALT: 861 IU/L (ref 0–32)
AST: 70 IU/L — ABNORMAL HIGH (ref 0–40)
Alkaline Phosphatase: 144 IU/L — ABNORMAL HIGH (ref 44–121)
Bilirubin Total: 0.4 mg/dL (ref 0.0–1.2)
Bilirubin, Direct: 0.21 mg/dL (ref 0.00–0.40)

## 2022-08-29 LAB — CBC WITH DIFFERENTIAL/PLATELET
Basophils Absolute: 0 10*3/uL (ref 0.0–0.2)
Basos: 0 %
EOS (ABSOLUTE): 0.3 10*3/uL (ref 0.0–0.4)
Eos: 3 %
Hematocrit: 38.3 % (ref 34.0–46.6)
Hemoglobin: 12 g/dL (ref 11.1–15.9)
Immature Grans (Abs): 0.4 10*3/uL — ABNORMAL HIGH (ref 0.0–0.1)
Immature Granulocytes: 4 %
Lymphocytes Absolute: 1.9 10*3/uL (ref 0.7–3.1)
Lymphs: 21 %
MCH: 29.4 pg (ref 26.6–33.0)
MCHC: 31.3 g/dL — ABNORMAL LOW (ref 31.5–35.7)
MCV: 94 fL (ref 79–97)
Monocytes Absolute: 0.8 10*3/uL (ref 0.1–0.9)
Monocytes: 9 %
Neutrophils Absolute: 5.6 10*3/uL (ref 1.4–7.0)
Neutrophils: 63 %
Platelets: 432 10*3/uL (ref 150–450)
RBC: 4.08 x10E6/uL (ref 3.77–5.28)
RDW: 15 % (ref 11.7–15.4)
WBC: 9 10*3/uL (ref 3.4–10.8)

## 2022-08-29 LAB — LIPASE: Lipase: 88 U/L — ABNORMAL HIGH (ref 14–85)

## 2022-08-31 ENCOUNTER — Encounter: Payer: Self-pay | Admitting: Family Medicine

## 2022-08-31 ENCOUNTER — Ambulatory Visit (INDEPENDENT_AMBULATORY_CARE_PROVIDER_SITE_OTHER): Payer: Medicare Other | Admitting: Family Medicine

## 2022-08-31 ENCOUNTER — Emergency Department: Payer: Medicare Other

## 2022-08-31 ENCOUNTER — Other Ambulatory Visit: Payer: Self-pay

## 2022-08-31 ENCOUNTER — Emergency Department
Admission: EM | Admit: 2022-08-31 | Discharge: 2022-08-31 | Disposition: A | Discharge status: Home / Self Care | Payer: Medicare Other | Attending: Emergency Medicine | Admitting: Emergency Medicine

## 2022-08-31 VITALS — BP 110/84 | HR 100 | Temp 97.8°F | Ht 65.0 in | Wt 187.0 lb

## 2022-08-31 DIAGNOSIS — R748 Abnormal levels of other serum enzymes: Secondary | ICD-10-CM

## 2022-08-31 DIAGNOSIS — R0602 Shortness of breath: Secondary | ICD-10-CM

## 2022-08-31 DIAGNOSIS — Z8673 Personal history of transient ischemic attack (TIA), and cerebral infarction without residual deficits: Secondary | ICD-10-CM | POA: Diagnosis not present

## 2022-08-31 DIAGNOSIS — E039 Hypothyroidism, unspecified: Secondary | ICD-10-CM | POA: Diagnosis not present

## 2022-08-31 DIAGNOSIS — J849 Interstitial pulmonary disease, unspecified: Secondary | ICD-10-CM

## 2022-08-31 DIAGNOSIS — R1011 Right upper quadrant pain: Secondary | ICD-10-CM | POA: Insufficient documentation

## 2022-08-31 DIAGNOSIS — J449 Chronic obstructive pulmonary disease, unspecified: Secondary | ICD-10-CM | POA: Diagnosis not present

## 2022-08-31 DIAGNOSIS — J9811 Atelectasis: Secondary | ICD-10-CM | POA: Diagnosis not present

## 2022-08-31 DIAGNOSIS — I10 Essential (primary) hypertension: Secondary | ICD-10-CM | POA: Insufficient documentation

## 2022-08-31 DIAGNOSIS — R1012 Left upper quadrant pain: Secondary | ICD-10-CM

## 2022-08-31 LAB — COMPREHENSIVE METABOLIC PANEL
ALT: 322 U/L — ABNORMAL HIGH (ref 0–44)
AST: 35 U/L (ref 15–41)
Albumin: 3.5 g/dL (ref 3.5–5.0)
Alkaline Phosphatase: 88 U/L (ref 38–126)
Anion gap: 9 (ref 5–15)
BUN: 18 mg/dL (ref 8–23)
CO2: 27 mmol/L (ref 22–32)
Calcium: 9.2 mg/dL (ref 8.9–10.3)
Chloride: 102 mmol/L (ref 98–111)
Creatinine, Ser: 0.93 mg/dL (ref 0.44–1.00)
GFR, Estimated: >60 mL/min (ref >60)
Glucose, Bld: 103 mg/dL — ABNORMAL HIGH (ref 70–99)
Potassium: 4.2 mmol/L (ref 3.5–5.1)
Sodium: 138 mmol/L (ref 135–145)
Total Bilirubin: 0.8 mg/dL (ref 0.3–1.2)
Total Protein: 6.9 g/dL (ref 6.5–8.1)

## 2022-08-31 LAB — CBC
HCT: 38.2 % (ref 36.0–46.0)
Hemoglobin: 12 g/dL (ref 12.0–15.0)
MCH: 29.7 pg (ref 26.0–34.0)
MCHC: 31.4 g/dL (ref 30.0–36.0)
MCV: 94.6 fL (ref 80.0–100.0)
Platelets: 486 10*3/uL — ABNORMAL HIGH (ref 150–400)
RBC: 4.04 MIL/uL (ref 3.87–5.11)
RDW: 16 % — ABNORMAL HIGH (ref 11.5–15.5)
WBC: 11.4 10*3/uL — ABNORMAL HIGH (ref 4.0–10.5)
nRBC: 0 % (ref 0.0–0.2)

## 2022-08-31 LAB — URINALYSIS, ROUTINE W REFLEX MICROSCOPIC
Bilirubin Urine: NEGATIVE
Glucose, UA: NEGATIVE mg/dL
Hgb urine dipstick: NEGATIVE
Ketones, ur: 5 mg/dL — AB
Leukocytes,Ua: NEGATIVE
Nitrite: NEGATIVE
Protein, ur: 30 mg/dL — AB
Specific Gravity, Urine: 1.024 (ref 1.005–1.030)
pH: 6 (ref 5.0–8.0)

## 2022-08-31 LAB — LIPASE, BLOOD: Lipase: 66 U/L — ABNORMAL HIGH (ref 11–51)

## 2022-08-31 NOTE — ED Provider Notes (Signed)
[facility] Provider Note    Event Date/Time   First MD Initiated Contact with Patient 08/31/22 1359     (approximate)   History   Abnormal Lab   HPI  Danielle Taylor is a 77 y.o. female past medical history of COPD, depression, GERD, history of hepatitis C presents because of abnormal LFTs.  Patient was on a cruise last week.  Prior to leaving for the cruise she developed nausea vomiting.  She was initially seen on the boat and had elevated LFTs and they wanted her to go to the hospital but she ultimately did not.  When she returned she developed right upper quadrant pain rating around to the back.  She is no longer vomiting is not having diarrhea but is nauseous.  She has chronic cough from her COPD denies increasing dyspnea.  Denies fevers chills.  She went to see her primary care doctor to follow-up today and she told her to come to the emergency department.   Reviewed patient's CMP from 3 days ago which shows ALT 860 AST 70 alk phos 144 with normal T. bili.  This is improving.  Patient has been taking Tylenol since being on the cruise but she has been doing this as directed 2 pills every 4 hours.  She is not sure exactly of the dose.  Past Medical History:  Diagnosis Date   Allergy    Chronic gastritis    Chronic low back pain with sciatica    COPD (chronic obstructive pulmonary disease) (HCC)    Depression    Diverticulosis    Dysrhythmia    Complete Heart Block   Gastritis, chronic    GERD (gastroesophageal reflux disease)    Graves disease    H/O hepatitis    Hallux valgus of right foot    Hammer toe    History of hiatal hernia    History of shingles    Hyperlipidemia    Hypertension    Hypothyroidism    Incontinence    bladder and bowel   Paroxysmal atrial fibrillation (HCC)    Presence of permanent cardiac pacemaker    2012   Stroke [facility])     Patient Active Problem List   Diagnosis Date Noted   Osteoarthritis of left knee  12/23/2020   Dupuytren's contracture 09/12/2019   Mild left ventricular systolic dysfunction 87/86/7672   Anticoagulation adequate with anticoagulant therapy 10/28/2018   Complete heart block, transient ([place]) 10/28/2018   COPD (chronic obstructive pulmonary disease) ([place]) 10/28/2018   Penicillin allergy 10/28/2018   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   Lumbar radiculopathy 06/29/2018   Chronic low back pain with sciatica 02/15/2018   Reactive airway disease, mild intermittent, uncomplicated 09/47/0962   Chronic seasonal allergic rhinitis due to pollen 08/10/2017   Mixed hyperlipidemia 08/10/2017   Depression 08/10/2017   Class 1 obesity due to excess calories without serious comorbidity with body mass index (BMI) of 34.0 to 34.9 in adult 08/10/2017   Chronic cough 06/15/2017   Interstitial lung disease ([place]) 06/15/2017   Centrilobular emphysema ([place]) 04/02/2017   Lumbar disc disease 04/02/2017   Hepatic steatosis 04/02/2017   Atherosclerosis of coronary artery of native heart 04/02/2017   Cardiac pacemaker in situ 01/06/2017   Paroxysmal A-fib ([place]) 03/30/2016   Syncope and collapse 03/11/2016   Right leg weakness 03/11/2016   Essential hypertension 03/11/2016   Hyperglycemia 03/11/2016   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 05/15/2015   Reactive airway disease 03/04/2015  Hypothyroid 12/07/2014   Familial multiple lipoprotein-type hyperlipidemia 12/07/2014   Acute bronchitis 12/07/2014   Recurrent major depressive episodes ([place]) 12/07/2014   Essential (primary) hypertension 12/07/2014   H/O endocrine disorder 12/07/2014   Pre-operative examination 12/07/2014   History of depression 09/22/2012   Fitting or adjustment of cardiac pacemaker 12/02/2011   Chronic gastritis 07/24/1998     Physical Exam  Triage Vital Signs: ED Triage Vitals  Enc Vitals Group     BP 08/31/22 1103 (!) 123/93     Pulse Rate 08/31/22 1103 70     Resp  08/31/22 1103 (!) 24     Temp 08/31/22 1103 98 F (36.7 C)     Temp src --      SpO2 08/31/22 1103 (!) 86 %     Weight --      Height --      Head Circumference --      Peak Flow --      Pain Score 08/31/22 1018 7     Pain Loc --      Pain Edu? --      Excl. in [place]? --     Most recent vital signs: Vitals:   08/31/22 1103 08/31/22 1104  BP: (!) 123/93   Pulse: 70   Resp: (!) 24   Temp: 98 F (36.7 C)   SpO2: (!) 86% 92%     General: Awake, no distress.  CV:  Good peripheral perfusion.  Resp:  Normal effort.  Crackles at the left base, no increased work of breathing Abd:  No distention.  Soft, mild right upper and right mid abdominal tenderness without guarding Neuro:             Awake, Alert, Oriented x 3  Other:     ED Results / Procedures / Treatments  Labs (all labs ordered are listed, but only abnormal results are displayed) Labs Reviewed  LIPASE, BLOOD - Abnormal; Notable for the following components:      Result Value   Lipase 66 (*)    All other components within normal limits  COMPREHENSIVE METABOLIC PANEL - Abnormal; Notable for the following components:   Glucose, Bld 103 (*)    ALT 322 (*)    All other components within normal limits  CBC - Abnormal; Notable for the following components:   WBC 11.4 (*)    RDW 16.0 (*)    Platelets 486 (*)    All other components within normal limits  URINALYSIS, ROUTINE W REFLEX MICROSCOPIC - Abnormal; Notable for the following components:   Color, Urine YELLOW (*)    APPearance CLEAR (*)    Ketones, ur 5 (*)    Protein, ur 30 (*)    Bacteria, UA RARE (*)    All other components within normal limits     EKG     RADIOLOGY    PROCEDURES:  Critical Care performed: No  Procedures   MEDICATIONS ORDERED IN ED: Medications - No data to display   IMPRESSION / MDM / [place] / ED COURSE  I reviewed the triage vital signs and the nursing notes.                              Patient's  presentation is most consistent with acute complicated illness / injury requiring diagnostic workup.  Differential diagnosis includes, but is not limited to, cholecystitis, biliary colic, choledocholithiasis, hepatitis  The patient is a  77 year old female on 2 L chronically for COPD presenting with elevated LFTs.  She started having vomiting and diarrhea last week prior to the start of a cruise and had symptoms throughout the cruise.  Seen on the ship and was told she had elevated LFTs but did not want to go to the hospital.  Has followed up with her PCP since with LFTs 3 days ago showing ALT of 800 AST 70 and lipase of 88.  Apparently she followed up with her primary doctor today and without drawn repeat labs was told to come to the ED because of abnormal lung sounds.  Patient is denying any new cough or dyspnea.  She is on 92% on her home baseline 2 L.   She endorses right upper quadrant pain over the last several days.  Did not initially have pain while she had the nausea vomiting on her cruise.  On exam she has mild tenderness in the right upper quadrant but no guarding.  LFTs today are significantly improved ALT just 322 normal AST normal alk phos and T. bili.  Lipase just above upper limit of normal.  Overall this is reassuring.  Patient has been taking Tylenol but sounds like an appropriate dose and would not expect LFTs to downtrend if she were taking 2 Tylenol overdose.  Plan to obtain a right upper quadrant ultrasound.  If this is negative due to patient's persistent pain may need to get a CT of the abdomen and pelvis.  Will also obtain a chest x-ray as I did hear some crackles at the left base.   Ultrasound is negative.  Chest x-ray is clear.  On repeat assessment patient is not having any pleuritic pain I think PE is less likely.  Repeat abdominal exam she really has no tenderness in the right upper quadrant.  She is not tender anywhere else in the abdomen so feel that CT of the abdomen pelvis  is not likely to be useful.  I reviewed patient's labs from the cruise and her AST and ALT were so high that they were not able to be measured.  This is greatly improved currently so I feel that since things are moving in the correct direction and patient has a normal ultrasound that we do not necessarily need to do further workup today.  Discussed alcohol cessation and avoiding any Tylenol.  Did do prefers to get the CT and she agreed that we could hold off as long as things are improving.  Recommended continue to follow-up with her primary care so the AST can be trended to normal.  We discussed return precautions.   FINAL CLINICAL IMPRESSION(S) / ED DIAGNOSES   Final diagnoses:  RUQ pain     Rx / DC Orders   ED Discharge Orders     None        Note:  This document was prepared using Dragon voice recognition software and may include unintentional dictation errors.   Rada Hay, MD 08/31/22 1534

## 2022-08-31 NOTE — Progress Notes (Signed)
Date:  08/31/2022   Name:  Danielle Taylor   DOB:  September 28, 1945   MRN:  [id number]   Chief Complaint: Follow-up  Abdominal Pain This is a new problem. The current episode started 1 to 4 weeks ago (started 08/18/22). The onset quality is gradual. The problem has been gradually worsening. The pain is located in the LUQ and RUQ. The pain is at a severity of 8/10. The pain is moderate. The quality of the pain is colicky. The abdominal pain radiates to the back and right flank. Associated symptoms include nausea. Pertinent negatives include no diarrhea, fever, frequency, headaches, hematochezia, hematuria, melena or weight loss. She has tried nothing for the symptoms.  Shortness of Breath This is a new problem. The current episode started in the past 7 days. The problem occurs intermittently. Associated symptoms include abdominal pain and wheezing. Pertinent negatives include no chest pain, fever, headaches or leg swelling.    Lab Results  Component Value Date   NA 143 08/28/2022   K 5.3 (H) 08/28/2022   CO2 23 08/28/2022   GLUCOSE 108 (H) 08/28/2022   BUN 17 08/28/2022   CREATININE 0.87 08/28/2022   CALCIUM 9.4 08/28/2022   EGFR 70 08/28/2022   GFRNONAA 64 09/25/2020   Lab Results  Component Value Date   CHOL 189 04/29/2022   HDL 49 04/29/2022   LDLCALC 109 (H) 04/29/2022   TRIG 179 (H) 04/29/2022   CHOLHDL 3.5 02/08/2017   Lab Results  Component Value Date   TSH 2.580 04/29/2022   Lab Results  Component Value Date   HGBA1C 6.3 (H) 03/11/2016   Lab Results  Component Value Date   WBC 9.0 08/28/2022   HGB 12.0 08/28/2022   HCT 38.3 08/28/2022   MCV 94 08/28/2022   PLT 432 08/28/2022   Lab Results  Component Value Date   ALT 861 (HH) 08/28/2022   AST 70 (H) 08/28/2022   ALKPHOS 144 (H) 08/28/2022   BILITOT 0.4 08/28/2022   No results found for: "25OHVITD2", "25OHVITD3", "VD25OH"   Review of Systems  Constitutional:  Negative for fever and weight loss.   HENT:  Positive for sinus pressure. Negative for sinus pain and sneezing.   Respiratory:  Positive for cough, shortness of breath and wheezing. Negative for chest tightness.   Cardiovascular:  Negative for chest pain, palpitations and leg swelling.  Gastrointestinal:  Positive for abdominal pain and nausea. Negative for blood in stool, diarrhea, hematochezia and melena.  Genitourinary:  Negative for frequency and hematuria.  Neurological:  Negative for headaches.    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   Lumbar radiculopathy 06/29/2018   Chronic low back pain with sciatica 02/15/2018   Reactive airway disease, mild intermittent, uncomplicated 32/44/0102   Chronic seasonal allergic rhinitis due to pollen 08/10/2017   Mixed hyperlipidemia 08/10/2017   Depression 08/10/2017   Class 1 obesity due to excess calories without serious comorbidity with body mass index (BMI) of 34.0 to 34.9 in adult 08/10/2017   Chronic cough 06/15/2017   Interstitial lung disease ([place]) 06/15/2017   Centrilobular emphysema ([place]) 04/02/2017   Lumbar disc disease 04/02/2017   Hepatic steatosis  04/02/2017   Atherosclerosis of coronary artery of native heart 04/02/2017   Cardiac pacemaker in situ 01/06/2017   Paroxysmal A-fib ([place]) 03/30/2016   Syncope and collapse 03/11/2016   Right leg weakness 03/11/2016   Essential hypertension 03/11/2016   Hyperglycemia 03/11/2016   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     Penicillins Other (See Comments)    Has patient had a PCN reaction causing immediate rash, facial/tongue/throat swelling, SOB or lightheadedness with hypotension: No Has patient had a PCN reaction causing severe rash involving mucus membranes or skin necrosis: No Has patient had a PCN reaction that required hospitalization No Has patient had a PCN reaction occurring within the last 10 years: No If all of the above answers are "NO", then may proceed with Cephalosporin use.     Past Surgical History:  Procedure Laterality Date   APPENDECTOMY     COLONOSCOPY  08/24/1998   COLONOSCOPY WITH PROPOFOL N/A 04/26/2015   Procedure: COLONOSCOPY WITH PROPOFOL;  Surgeon: Hulen Luster, MD;  Location: [facility] ENDOSCOPY;  Service: Gastroenterology;  Laterality: N/A;   COLONOSCOPY WITH PROPOFOL N/A 07/14/2021   Procedure: COLONOSCOPY WITH PROPOFOL;  Surgeon: [name], DO;  Location: [facility] ENDOSCOPY;  Service: Gastroenterology;  Laterality: N/A;   EYE SURGERY     fibroid tumors     fingers and wrist   FOOT SURGERY     TONSILLECTOMY     TRANSFORAMINAL LUMBAR INTERBODY FUSION (TLIF) WITH PEDICLE SCREW FIXATION 1 LEVEL N/A 06/29/2018   Procedure: TRANSFORAMINAL LUMBAR INTERBODY FUSION (TLIF) WITH PEDICLE SCREW FIXATION 1 LEVEL;  Surgeon: [name], MD;  Location: [facility] ORS;  Service: Neurosurgery;  Laterality: N/A;   VAGINAL HYSTERECTOMY      Social History   Tobacco Use   Smoking  status: Former    Packs/day: 0.50    Years: 33.00    Total pack years: 16.50    Types: Cigarettes    Quit date: 2003    Years since quitting: 21.0   Smokeless tobacco: Never   Tobacco comments:    smoking cessation materials not required  Vaping Use   Vaping Use: Never used  Substance Use Topics   Alcohol use: Yes    Alcohol/week: 1.0 standard drink of alcohol    Types: 1 Glasses of wine per week    Comment: occassional   Drug use: Never     Medication list has been reviewed and updated.  Current Meds  Medication Sig   acetaminophen (TYLENOL) 500 MG tablet Take 1,000 mg by mouth every 8 (eight) hours as needed (pain).   albuterol (PROVENTIL) (2.5 MG/3ML) 0.083% nebulizer solution Take 3 mLs (2.5 mg total) by nebulization every 6 (six) hours as needed for wheezing or shortness of breath.   citalopram (CELEXA) 40 MG tablet Take 1 tablet (40 mg total) by mouth daily.   Fluticasone-Umeclidin-Vilant (TRELEGY ELLIPTA) 100-62.5-25 MCG/ACT AEPB Inhale 1 Inhaler into the lungs daily.   gabapentin (NEURONTIN) 300 MG capsule  Take 600 mg by mouth 3 (three) times daily. [place]   gemfibrozil (LOPID) 600 MG tablet Take 1 tablet (600 mg total) by mouth daily.   levothyroxine (SYNTHROID) 112 MCG tablet Take 1 tablet (112 mcg total) by mouth daily.   loratadine (CLARITIN) 10 MG tablet Take 1 tablet (10 mg total) by mouth daily.   losartan (COZAAR) 25 MG tablet Take 2 tablets (50 mg total) by mouth daily.   metoprolol succinate (TOPROL-XL) 50 MG 24 hr tablet Take 1 tablet (50 mg total) by mouth daily. TAKE WITH OR IMMEDIATELY FOLLOWING A MEAL.   montelukast (SINGULAIR) 10 MG tablet TAKE 1 TABLET BY MOUTH EVERY DAY   Multiple Vitamins-Minerals (CENTRUM SILVER 50+WOMEN PO) Take 1 tablet by mouth daily.   nystatin cream (MYCOSTATIN) APPLY TO AFFECTED AREA TWICE A DAY   omeprazole (PRILOSEC) 20 MG capsule Take 20 mg by mouth daily.   ondansetron (ZOFRAN) 4 MG tablet Take 1 tablet (4 mg total) by  mouth every 8 (eight) hours as needed for nausea or vomiting.   pantoprazole (PROTONIX) 40 MG tablet Take 1 tablet (40 mg total) by mouth daily.   rivaroxaban (XARELTO) 20 MG TABS tablet Take 20 mg by mouth daily with supper. Cardiologist [facility]   simvastatin (ZOCOR) 20 MG tablet Take 1 tablet (20 mg total) by mouth daily.   Current Facility-Administered Medications for the 08/31/22 encounter (Office Visit) with Juline Patch, MD  Medication   albuterol (PROVENTIL) (2.5 MG/3ML) 0.083% nebulizer solution 2.5 mg   ipratropium-albuterol (DUONEB) 0.5-2.5 (3) MG/3ML nebulizer solution 3 mL       07/28/2022    9:23 AM 04/29/2022    9:48 AM 04/13/2022    2:38 PM 10/09/2021   10:27 AM  GAD 7 : Generalized Anxiety Score  Nervous, Anxious, on Edge 0 0 0 0  Control/stop worrying 0 0 0 0  Worry too much - different things 0 0 0 0  Trouble relaxing 0 0 0 0  Restless 0 0 0 0  Easily annoyed or irritable 0 0 0 0  Afraid - awful might happen 0 0 0 0  Total GAD 7 Score 0 0 0 0  Anxiety Difficulty Not difficult at all Not difficult at all Not difficult at all Not difficult at all       07/28/2022    9:23 AM 04/29/2022    9:48 AM 04/13/2022    2:38 PM  Depression screen PHQ 2/9  Decreased Interest 0 0 0  Down, Depressed, Hopeless 0 0 0  PHQ - 2 Score 0 0 0  Altered sleeping 0 0 0  Tired, decreased energy 0 0 0  Change in appetite 0 0 0  Feeling bad or failure about yourself  0 0 0  Trouble concentrating 0 0 0  Moving slowly or fidgety/restless 0 0 0  Suicidal thoughts 0 0 0  PHQ-9 Score 0 0 0  Difficult doing work/chores Not difficult at all Not difficult at all Not difficult at all    BP Readings from Last 3 Encounters:  08/31/22 110/84  08/28/22 100/60  07/28/22 128/78    Physical Exam Vitals and nursing note reviewed. Exam conducted with a chaperone present.  Constitutional:      General: She is not in acute distress.    Appearance: She is not diaphoretic.  HENT:     Head:  Normocephalic.  Eyes:     General:        Right eye: No discharge.  Left eye: No discharge.     Conjunctiva/sclera: Conjunctivae normal.  Neck:     Thyroid: No thyromegaly.     Vascular: No JVD.  Cardiovascular:     Rate and Rhythm: Normal rate and regular rhythm.     Heart sounds: Normal heart sounds. No murmur heard.    No friction rub. No gallop.  Pulmonary:     Effort: Respiratory distress present.     Breath sounds: Examination of the left-lower field reveals decreased breath sounds. Decreased breath sounds, wheezing and rales present. No rhonchi.  Abdominal:     General: Bowel sounds are normal.     Palpations: Abdomen is soft. There is no mass.     Tenderness: There is no abdominal tenderness. There is no guarding.  Musculoskeletal:        General: Normal range of motion.     Cervical back: Neck supple.  Lymphadenopathy:     Cervical: No cervical adenopathy.  Skin:    General: Skin is warm and dry.  Neurological:     Mental Status: She is alert.     Wt Readings from Last 3 Encounters:  08/31/22 187 lb (84.8 kg)  08/28/22 188 lb (85.3 kg)  07/28/22 190 lb (86.2 kg)    BP 110/84   Pulse 100   Temp 97.8 F (36.6 C) (Oral)   Ht '5\' 5"'$  (1.651 m)   Wt 187 lb (84.8 kg)   SpO2 90% Comment: started at 82 after walking  BMI 31.12 kg/m   Assessment and Plan:  1. Abdominal pain, bilateral upper quadrant New onset this to be can 2 weeks ago shortly before boarding for a cruise.  While on the cruise patient had abdominal pain with extremely elevated liver function tests which were not available for review.  Patient was suggested to go to a port hospital to be evaluated CT but did not choose to do so.  Patient rested in room with oxygen given her underlying interstitial lung disease.  In the following time she was reevaluated upon return on Friday where tests were repeated and it was noted that she had an extremely elevated ALT of 861 with a slightly elevated AST of 70  slightly elevated lipase.  There was also a moderately elevated alkaline phosphatase with normal bilirubin.  The intensity of the pain has increased to 7 at 8/10 and now involves the left upper quadrant from the exam on Friday and also is radiating to the back.  Given the abnormal LFTs and particularly the ALT along with slight elevation of lipase we will refer to ER setting for evaluation possible CT.  Patient does have an upcoming ultrasound but I do not think it would be prudent to wait until Friday to do so given that there may be something more going on of her hepatic nature.  2. Abnormal transaminases As noted previous there is been elevated transaminases that was noted on a cruise ship of an unknown level but her husband states that the doctor says that they were not able to measure it because it was too high.  There is something hepatic going on that needs to be evaluated further on a urgent circumstance and seen that her pain has increased in intensity and now involves the back and left side we will refer to ER setting for evaluation.  3. Shortness of breath New onset is gradually increased shortness of breath upon arrival from cruise.  I have concerns that this may be of a COVID or  other infectious respiratory nature such as influenza or RSV.  Patient was negative for COVID on the cruise but given the circumstances of cruises at this time and the proximity of other individuals and her underlying lung condition for which she is followed by pulmonary I do have concerns of pneumonia I have decreased breath sounds in the left posterior lobe and suspect pulse ox at the patient's long finger nail of 83 percent/patient has long fingernails and I do not think that this is her true reading..  I do not think that this is a normal reading and we will either need to do an arterial blood glass to get probably a true reading of this or perhaps other means of measuring arterial oxygenation.  We will refer to ER  setting and likely will need a chest x-ray and a repeat CBC as that the white cell count was not elevated on reading on Friday.  4. Interstitial lung disease ([place]) Patient is followed by pulmonary at [facility] Dr. Raul Del for bronchitis with underlying COPD recurrence.  As noted above there is decreased breath sounds noted in the right posterior lobe which may be her baseline but needs further evaluation prior to starting a prednisone and increased nebulization.   Otilio Miu, MD

## 2022-08-31 NOTE — ED Notes (Signed)
Verbal understanding of discharge instructions.

## 2022-08-31 NOTE — Discharge Instructions (Addendum)
Your AST was 322 which is improved from when you are on the cruise and 3 days ago.  Your ultrasound of your gallbladder was normal.  Please avoid any Tylenol and avoid drinking alcohol until your liver function test normalized.  Please continue to follow-up with your primary care doctor.  If your pain is worsening you develop fevers or unable to eat or drink please return to the emergency department.

## 2022-08-31 NOTE — ED Notes (Signed)
Pt 86% on RA, placed on 2 L [place]

## 2022-08-31 NOTE — ED Triage Notes (Addendum)
Pt comes with c/o abnormal labs. Pt states she was having right upper quad pain prior to a cruise. Pt states pain has continued. Liver function and lipase were elevated. Pt sent here for CT scan.  Pt also states some back pain as well.

## 2022-09-01 ENCOUNTER — Other Ambulatory Visit: Payer: Self-pay

## 2022-09-01 ENCOUNTER — Telehealth: Payer: Self-pay

## 2022-09-01 DIAGNOSIS — R748 Abnormal levels of other serum enzymes: Secondary | ICD-10-CM

## 2022-09-01 DIAGNOSIS — R1011 Right upper quadrant pain: Secondary | ICD-10-CM

## 2022-09-01 NOTE — Telephone Encounter (Signed)
Transition Care Management Follow-up Telephone Call Date of discharge and from where: 09/01/2022 [facility] How have you been since you were released from the hospital? Still nauseated, but eating ok Any questions or concerns? No  Items Reviewed: Did the pt receive and understand the discharge instructions provided? Yes  Medications obtained and verified? No  Other? No  Any new allergies since your discharge? No  Dietary orders reviewed? Yes Do you have support at home? Yes   Home Care and Equipment/Supplies: none  Functional Questionnaire: (I = Independent and D = Dependent) ADLs: I  Bathing/Dressing- I  Meal Prep- I  Eating- I  Maintaining continence- I  Transferring/Ambulation- I  Managing Meds- I  Follow up appointments reviewed:  PCP Hospital f/u appt confirmed? Yes  Scheduled to see Otilio Miu on 09/10/2022 @ 3:00. [facility] f/u appt confirmed?  Making appt for ID   Are transportation arrangements needed? No  If their condition worsens, is the pt aware to call PCP or go to the Emergency Dept.? Yes Was the patient provided with contact information for the PCP's office or ED? Yes Was to pt encouraged to call back with questions or concerns? Yes

## 2022-09-01 NOTE — Progress Notes (Signed)
Placed ref to ID

## 2022-09-04 ENCOUNTER — Ambulatory Visit: Admission: RE | Admit: 2022-09-04 | Payer: Medicare Other | Source: Ambulatory Visit

## 2022-09-10 ENCOUNTER — Ambulatory Visit (INDEPENDENT_AMBULATORY_CARE_PROVIDER_SITE_OTHER): Payer: Medicare Other | Admitting: Family Medicine

## 2022-09-10 ENCOUNTER — Encounter: Payer: Self-pay | Admitting: Family Medicine

## 2022-09-10 VITALS — BP 124/79 | HR 70 | Ht 65.0 in | Wt 186.0 lb

## 2022-09-10 DIAGNOSIS — R7401 Elevation of levels of liver transaminase levels: Secondary | ICD-10-CM | POA: Diagnosis not present

## 2022-09-10 DIAGNOSIS — R110 Nausea: Secondary | ICD-10-CM | POA: Diagnosis not present

## 2022-09-10 NOTE — Progress Notes (Signed)
Date:  09/10/2022   Name:  Danielle Taylor   DOB:  Nov 15, 1945   MRN:  [id number]   Chief Complaint: Hospitalization Follow-up (RUQ pain- went to hospital on 08/31/22 and TOC placed on 09/01/22- still having nausea)  Patient is a 77 year old female who presents for a hepatic/abdomenal exam. The patient reports the following problems: significant elevated aminiases and midepigastric tenderness. Health maintenance has been reviewed up to date,    Abdominal Pain This is a new problem. The current episode started 1 to 4 weeks ago. The onset quality is gradual. The problem has been unchanged. The pain is located in the epigastric region. The quality of the pain is colicky. The abdominal pain radiates to the back. Associated symptoms include nausea. Pertinent negatives include no arthralgias, constipation, diarrhea, dysuria, fever, frequency, headaches, hematochezia, hematuria, melena, myalgias or vomiting. Nothing aggravates the pain. She has tried proton pump inhibitors for the symptoms. The treatment provided mild relief. Prior diagnostic workup includes ultrasound.    Lab Results  Component Value Date   NA 138 08/31/2022   K 4.2 08/31/2022   CO2 27 08/31/2022   GLUCOSE 103 (H) 08/31/2022   BUN 18 08/31/2022   CREATININE 0.93 08/31/2022   CALCIUM 9.2 08/31/2022   EGFR 70 08/28/2022   GFRNONAA >60 08/31/2022   Lab Results  Component Value Date   CHOL 189 04/29/2022   HDL 49 04/29/2022   LDLCALC 109 (H) 04/29/2022   TRIG 179 (H) 04/29/2022   CHOLHDL 3.5 02/08/2017   Lab Results  Component Value Date   TSH 2.580 04/29/2022   Lab Results  Component Value Date   HGBA1C 6.3 (H) 03/11/2016   Lab Results  Component Value Date   WBC 11.4 (H) 08/31/2022   HGB 12.0 08/31/2022   HCT 38.2 08/31/2022   MCV 94.6 08/31/2022   PLT 486 (H) 08/31/2022   Lab Results  Component Value Date   ALT 322 (H) 08/31/2022   AST 35 08/31/2022   ALKPHOS 88 08/31/2022   BILITOT 0.8 08/31/2022    No results found for: "25OHVITD2", "25OHVITD3", "VD25OH"   Review of Systems  Constitutional: Negative.  Negative for chills, fatigue, fever and unexpected weight change.  HENT:  Negative for congestion, ear discharge, ear pain, rhinorrhea, sinus pressure, sneezing and sore throat.   Respiratory:  Negative for cough, shortness of breath, wheezing and stridor.   Gastrointestinal:  Positive for abdominal pain and nausea. Negative for blood in stool, constipation, diarrhea, hematochezia, melena and vomiting.  Genitourinary:  Negative for dysuria, flank pain, frequency, hematuria, urgency and vaginal discharge.  Musculoskeletal:  Negative for arthralgias, back pain and myalgias.  Skin:  Negative for rash.  Neurological:  Negative for dizziness, weakness and headaches.  Hematological:  Negative for adenopathy. Does not bruise/bleed easily.  Psychiatric/Behavioral:  Negative for dysphoric mood. The patient is not nervous/anxious.     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   Lumbar radiculopathy 06/29/2018   Chronic low back pain with sciatica 02/15/2018   Reactive airway disease, mild intermittent, uncomplicated 26/83/4196   Chronic seasonal allergic rhinitis due to pollen 08/10/2017   Mixed hyperlipidemia 08/10/2017  Depression 08/10/2017   Class 1 obesity due to excess calories without serious comorbidity with body mass index (BMI) of 34.0 to 34.9 in adult 08/10/2017   Chronic cough 06/15/2017   Interstitial lung disease ([place]) 06/15/2017   Centrilobular emphysema ([place]) 04/02/2017   Lumbar  disc disease 04/02/2017   Hepatic steatosis 04/02/2017   Atherosclerosis of coronary artery of native heart 04/02/2017   Cardiac pacemaker in situ 01/06/2017   Paroxysmal A-fib ([place]) 03/30/2016   Syncope and collapse 03/11/2016   Right leg weakness 03/11/2016   Essential hypertension 03/11/2016   Hyperglycemia 03/11/2016   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     Penicillins Other (See Comments)    Has patient had a PCN reaction causing immediate rash, facial/tongue/throat swelling, SOB or lightheadedness with hypotension: No Has patient had a PCN reaction causing severe rash involving mucus membranes or skin necrosis: No Has patient had a PCN reaction that required hospitalization No Has patient had a PCN reaction occurring within the last 10 years: No If all of the above answers are "NO", then may proceed with Cephalosporin use.     Past Surgical History:  Procedure Laterality Date   APPENDECTOMY     COLONOSCOPY  08/24/1998   COLONOSCOPY WITH PROPOFOL N/A 04/26/2015   Procedure: COLONOSCOPY WITH PROPOFOL;  Surgeon: Hulen Luster, MD;  Location: [facility] ENDOSCOPY;  Service: Gastroenterology;  Laterality: N/A;   COLONOSCOPY WITH PROPOFOL N/A 07/14/2021   Procedure: COLONOSCOPY WITH PROPOFOL;  Surgeon: [name], DO;  Location: [facility] ENDOSCOPY;  Service: Gastroenterology;   Laterality: N/A;   EYE SURGERY     fibroid tumors     fingers and wrist   FOOT SURGERY     TONSILLECTOMY     TRANSFORAMINAL LUMBAR INTERBODY FUSION (TLIF) WITH PEDICLE SCREW FIXATION 1 LEVEL N/A 06/29/2018   Procedure: TRANSFORAMINAL LUMBAR INTERBODY FUSION (TLIF) WITH PEDICLE SCREW FIXATION 1 LEVEL;  Surgeon: [name], MD;  Location: [facility] ORS;  Service: Neurosurgery;  Laterality: N/A;   VAGINAL HYSTERECTOMY      Social History   Tobacco Use   Smoking status: Former    Packs/day: 0.50    Years: 33.00    Total pack years: 16.50    Types: Cigarettes    Quit date: 2003    Years since quitting: 21.0   Smokeless tobacco: Never   Tobacco comments:    smoking cessation materials not required  Vaping Use   Vaping Use: Never used  Substance Use Topics   Alcohol use: Yes    Alcohol/week: 1.0 standard drink of alcohol    Types: 1 Glasses of wine per week    Comment: occassional   Drug use: Never     Medication list has been reviewed and updated.  Current Meds  Medication Sig   acetaminophen (TYLENOL) 500 MG tablet Take 1,000 mg by mouth every 8 (eight) hours as needed (pain).   albuterol (PROVENTIL) (2.5 MG/3ML) 0.083% nebulizer solution Take 3 mLs (2.5  mg total) by nebulization every 6 (six) hours as needed for wheezing or shortness of breath.   citalopram (CELEXA) 40 MG tablet Take 1 tablet (40 mg total) by mouth daily.   Fluticasone-Umeclidin-Vilant (TRELEGY ELLIPTA) 100-62.5-25 MCG/ACT AEPB Inhale 1 Inhaler into the lungs daily.   gabapentin (NEURONTIN) 300 MG capsule Take 600 mg by mouth 3 (three) times daily. [place]   gemfibrozil (LOPID) 600 MG tablet Take 1 tablet (600 mg total) by mouth daily.   levothyroxine (SYNTHROID) 112 MCG tablet Take 1 tablet (112 mcg total) by mouth daily.   loratadine (CLARITIN) 10 MG tablet Take 1 tablet (10 mg total) by mouth daily.   losartan (COZAAR) 25 MG tablet Take 2 tablets (50 mg total) by mouth daily.   metoprolol  succinate (TOPROL-XL) 50 MG 24 hr tablet Take 1 tablet (50 mg total) by mouth daily. TAKE WITH OR IMMEDIATELY FOLLOWING A MEAL.   montelukast (SINGULAIR) 10 MG tablet TAKE 1 TABLET BY MOUTH EVERY DAY   Multiple Vitamins-Minerals (CENTRUM SILVER 50+WOMEN PO) Take 1 tablet by mouth daily.   nystatin cream (MYCOSTATIN) APPLY TO AFFECTED AREA TWICE A DAY   omeprazole (PRILOSEC) 20 MG capsule Take 20 mg by mouth daily.   ondansetron (ZOFRAN) 4 MG tablet Take 1 tablet (4 mg total) by mouth every 8 (eight) hours as needed for nausea or vomiting.   pantoprazole (PROTONIX) 40 MG tablet Take 1 tablet (40 mg total) by mouth daily.   rivaroxaban (XARELTO) 20 MG TABS tablet Take 20 mg by mouth daily with supper. Cardiologist [facility]   simvastatin (ZOCOR) 20 MG tablet Take 1 tablet (20 mg total) by mouth daily.   [DISCONTINUED] predniSONE (DELTASONE) 10 MG tablet Take 10 mg by mouth daily.   Current Facility-Administered Medications for the 09/10/22 encounter (Office Visit) with Juline Patch, MD  Medication   albuterol (PROVENTIL) (2.5 MG/3ML) 0.083% nebulizer solution 2.5 mg   ipratropium-albuterol (DUONEB) 0.5-2.5 (3) MG/3ML nebulizer solution 3 mL       09/10/2022    3:21 PM 07/28/2022    9:23 AM 04/29/2022    9:48 AM 04/13/2022    2:38 PM  GAD 7 : Generalized Anxiety Score  Nervous, Anxious, on Edge 0 0 0 0  Control/stop worrying 0 0 0 0  Worry too much - different things 0 0 0 0  Trouble relaxing 0 0 0 0  Restless 0 0 0 0  Easily annoyed or irritable 0 0 0 0  Afraid - awful might happen 0 0 0 0  Total GAD 7 Score 0 0 0 0  Anxiety Difficulty Not difficult at all Not difficult at all Not difficult at all Not difficult at all       09/10/2022    3:21 PM 07/28/2022    9:23 AM 04/29/2022    9:48 AM  Depression screen PHQ 2/9  Decreased Interest 0 0 0  Down, Depressed, Hopeless 0 0 0  PHQ - 2 Score 0 0 0  Altered sleeping 0 0 0  Tired, decreased energy 0 0 0  Change in appetite 0 0 0  Feeling  bad or failure about yourself  0 0 0  Trouble concentrating 0 0 0  Moving slowly or fidgety/restless 0 0 0  Suicidal thoughts 0 0 0  PHQ-9 Score 0 0 0  Difficult doing work/chores Not difficult at all Not difficult at all Not difficult at all    BP Readings from Last 3 Encounters:  09/10/22 124/79  08/31/22 Marland Kitchen)  123/93  08/31/22 110/84    Physical Exam Vitals and nursing note reviewed.  HENT:     Right Ear: Tympanic membrane and ear canal normal.     Left Ear: Tympanic membrane and ear canal normal.     Nose: Nose normal.  Cardiovascular:     Heart sounds: No murmur heard.    No friction rub. No gallop.  Pulmonary:     Breath sounds: No wheezing or rhonchi.  Abdominal:     Tenderness: There is abdominal tenderness in the epigastric area. There is guarding. There is no right CVA tenderness, left CVA tenderness or rebound.     Wt Readings from Last 3 Encounters:  09/10/22 186 lb (84.4 kg)  08/31/22 187 lb (84.8 kg)  08/28/22 188 lb (85.3 kg)    BP 124/79   Pulse 70   Ht '5\' 5"'$  (1.651 m)   Wt 186 lb (84.4 kg)   BMI 30.95 kg/m   Assessment and Plan:  1. Elevated transaminase level Patient returns today for follow-up of previously elevated transaminases on a cruise.  Further workup including screening for hepatitis and repeat hepatic function per Dr. Steva Ready.  Results seen the the next morning are negative we will proceed with a CT scan of the abdomen seen that she is continue to have tenderness in the epigastric and guarding.  Repeat of amylase lipase and hepatic function only positive for mild elevation of ALT. - Hepatitis B Surface AntiBODY - Hepatitis C Antibody - Hepatic function panel - Hepatitis A Ab, Total - H. pylori breath test  2. Nausea Patient continues to have nausea we will continue on occasional Zofran but primarily want her to continue her pantoprazole.  We will check a H. pylori breath test and as above we will be getting a CT of the abdomen and if  negative refer to gastroenterology/Dr. Alice Reichert for possible endoscopy. - H. pylori antibody, IgG - Lipase - Amylase - H. pylori breath test    Otilio Miu, MD

## 2022-09-11 ENCOUNTER — Encounter: Payer: Self-pay | Admitting: Family Medicine

## 2022-09-11 LAB — HEPATIC FUNCTION PANEL
ALT: 39 IU/L — ABNORMAL HIGH (ref 0–32)
AST: 23 IU/L (ref 0–40)
Albumin: 4.2 g/dL (ref 3.8–4.8)
Alkaline Phosphatase: 83 IU/L (ref 44–121)
Bilirubin Total: 1 mg/dL (ref 0.0–1.2)
Bilirubin, Direct: 0.3 mg/dL (ref 0.00–0.40)
Total Protein: 6.4 g/dL (ref 6.0–8.5)

## 2022-09-11 LAB — HEPATITIS B SURFACE ANTIBODY,QUALITATIVE: Hep B Surface Ab, Qual: NONREACTIVE

## 2022-09-11 LAB — H. PYLORI BREATH TEST: H pylori Breath Test: NEGATIVE

## 2022-09-11 LAB — AMYLASE: Amylase: 68 U/L (ref 31–110)

## 2022-09-11 LAB — LIPASE: Lipase: 41 U/L (ref 14–85)

## 2022-09-11 LAB — HEPATITIS A ANTIBODY, TOTAL: hep A Total Ab: NEGATIVE

## 2022-09-11 LAB — HEPATITIS C ANTIBODY: Hep C Virus Ab: NONREACTIVE

## 2022-09-17 ENCOUNTER — Telehealth: Payer: Self-pay

## 2022-09-17 MED ORDER — GABAPENTIN 300 MG PO CAPS: 600.0000 mg | ORAL_CAPSULE | Freq: Three times a day (TID) | ORAL | 2 refills | Status: DC

## 2022-09-17 NOTE — Telephone Encounter (Signed)
Per Dr Izora Ribas, we can refill for 90 days, but he would like her to transition this rx to her PCP if she is doing well.

## 2022-09-17 NOTE — Telephone Encounter (Signed)
Left detailed message on pt's personal voicemail (confirmed by name) that refill was sent and we would like her to transition refills to her PCP if she is doing well, but we are happy to see her any time.

## 2022-09-17 NOTE — Telephone Encounter (Signed)
We haven't received any refill requests from [facility]. The last time she was seen by our office was with Lonell Face in 2021.Marland KitchenMarland Kitchen

## 2022-09-17 NOTE — Telephone Encounter (Signed)
I spoke with [facility] and confirmed that her dose is '600mg'$  TID. They stated that the first refill request they sent was today. I sent a refill for 90 days per discussion with Dr Izora Ribas.

## 2022-09-17 NOTE — Telephone Encounter (Signed)
-----  Message from Peggyann Shoals sent at 09/17/2022 11:27 AM EST ----- Regarding: med refill Contact: [phone] Gabapentin--she does not have the bottle because she left it at [place] when she went to ask for the refill. [facility] [place] told her that they have sent a request to [place] since last week with no response.

## 2022-10-26 ENCOUNTER — Other Ambulatory Visit: Payer: Self-pay | Admitting: Family Medicine

## 2022-10-26 DIAGNOSIS — E039 Hypothyroidism, unspecified: Secondary | ICD-10-CM

## 2022-10-28 ENCOUNTER — Encounter: Payer: Self-pay | Admitting: Family Medicine

## 2022-10-28 ENCOUNTER — Ambulatory Visit (INDEPENDENT_AMBULATORY_CARE_PROVIDER_SITE_OTHER): Payer: Medicare Other | Admitting: Family Medicine

## 2022-10-28 DIAGNOSIS — I10 Essential (primary) hypertension: Secondary | ICD-10-CM | POA: Diagnosis not present

## 2022-10-28 DIAGNOSIS — E039 Hypothyroidism, unspecified: Secondary | ICD-10-CM

## 2022-10-28 DIAGNOSIS — E7849 Other hyperlipidemia: Secondary | ICD-10-CM | POA: Diagnosis not present

## 2022-10-28 DIAGNOSIS — J301 Allergic rhinitis due to pollen: Secondary | ICD-10-CM

## 2022-10-28 DIAGNOSIS — E782 Mixed hyperlipidemia: Secondary | ICD-10-CM

## 2022-10-28 DIAGNOSIS — F32A Depression, unspecified: Secondary | ICD-10-CM

## 2022-10-28 DIAGNOSIS — R1011 Right upper quadrant pain: Secondary | ICD-10-CM | POA: Diagnosis not present

## 2022-10-28 DIAGNOSIS — R110 Nausea: Secondary | ICD-10-CM | POA: Diagnosis not present

## 2022-10-28 DIAGNOSIS — J4520 Mild intermittent asthma, uncomplicated: Secondary | ICD-10-CM

## 2022-10-28 MED ORDER — LEVOTHYROXINE SODIUM 112 MCG PO TABS: 112.0000 ug | ORAL_TABLET | Freq: Every day | ORAL | 1 refills | Status: DC

## 2022-10-28 MED ORDER — METOPROLOL SUCCINATE ER 50 MG PO TB24: 50.0000 mg | ORAL_TABLET | Freq: Every day | ORAL | 1 refills | Status: DC

## 2022-10-28 MED ORDER — LORATADINE 10 MG PO TABS: 10.0000 mg | ORAL_TABLET | Freq: Every day | ORAL | 1 refills | Status: DC

## 2022-10-28 MED ORDER — VALSARTAN 160 MG PO TABS: 160.0000 mg | ORAL_TABLET | Freq: Every day | ORAL | 0 refills | Status: DC

## 2022-10-28 MED ORDER — GEMFIBROZIL 600 MG PO TABS: 600.0000 mg | ORAL_TABLET | Freq: Every day | ORAL | 1 refills | Status: DC

## 2022-10-28 MED ORDER — CITALOPRAM HYDROBROMIDE 40 MG PO TABS: 40.0000 mg | ORAL_TABLET | Freq: Every day | ORAL | 1 refills | Status: DC

## 2022-10-28 MED ORDER — SIMVASTATIN 20 MG PO TABS: 20.0000 mg | ORAL_TABLET | Freq: Every day | ORAL | 1 refills | Status: DC

## 2022-10-28 MED ORDER — MONTELUKAST SODIUM 10 MG PO TABS: ORAL_TABLET | ORAL | 1 refills | Status: DC

## 2022-10-28 NOTE — Progress Notes (Signed)
Date:  10/28/2022   Name:  Danielle Taylor   DOB:  04/17/1946   MRN:  [id number]   Chief Complaint: Depression, Hyperlipidemia, Hypothyroidism, Allergic Rhinitis , Hypertension, and Asthma  Depression        This is a chronic problem.  The current episode started more than 1 year ago.   The onset quality is gradual.   The problem occurs intermittently.  The problem has been gradually improving since onset.  Associated symptoms include no decreased concentration, no fatigue, no helplessness, no hopelessness, does not have insomnia, not irritable, no restlessness, no decreased interest, no appetite change, no body aches, no myalgias, no headaches, no indigestion, not sad and no suicidal ideas.     The symptoms are aggravated by nothing.  Past treatments include SSRIs - Selective serotonin reuptake inhibitors. Hyperlipidemia This is a chronic problem. The current episode started more than 1 year ago. The problem is controlled. Pertinent negatives include no chest pain, myalgias or shortness of breath. Current antihyperlipidemic treatment includes statins. The current treatment provides moderate improvement of lipids. There are no compliance problems.   Hypertension This is a chronic problem. The problem has been gradually worsening since onset. The problem is uncontrolled. Pertinent negatives include no chest pain, headaches, neck pain, palpitations or shortness of breath. There are no associated agents to hypertension. Risk factors for coronary artery disease include dyslipidemia. Past treatments include angiotensin blockers and beta blockers. The current treatment provides moderate improvement. There are no compliance problems.  There is no history of CAD/MI or CVA.  Asthma There is no cough, difficulty breathing, shortness of breath or wheezing. This is a chronic problem. The problem occurs intermittently. The problem has been gradually improving. Pertinent negatives include no appetite change,  chest pain, ear pain, fever, headaches, myalgias or sore throat. Her past medical history is significant for asthma.    Lab Results  Component Value Date   NA 138 08/31/2022   K 4.2 08/31/2022   CO2 27 08/31/2022   GLUCOSE 103 (H) 08/31/2022   BUN 18 08/31/2022   CREATININE 0.93 08/31/2022   CALCIUM 9.2 08/31/2022   EGFR 70 08/28/2022   GFRNONAA >60 08/31/2022   Lab Results  Component Value Date   CHOL 189 04/29/2022   HDL 49 04/29/2022   LDLCALC 109 (H) 04/29/2022   TRIG 179 (H) 04/29/2022   CHOLHDL 3.5 02/08/2017   Lab Results  Component Value Date   TSH 2.580 04/29/2022   Lab Results  Component Value Date   HGBA1C 6.3 (H) 03/11/2016   Lab Results  Component Value Date   WBC 11.4 (H) 08/31/2022   HGB 12.0 08/31/2022   HCT 38.2 08/31/2022   MCV 94.6 08/31/2022   PLT 486 (H) 08/31/2022   Lab Results  Component Value Date   ALT 39 (H) 09/10/2022   AST 23 09/10/2022   ALKPHOS 83 09/10/2022   BILITOT 1.0 09/10/2022   No results found for: "25OHVITD2", "25OHVITD3", "VD25OH"   Review of Systems  Constitutional:  Negative for appetite change, chills, fatigue and fever.  HENT:  Negative for drooling, ear discharge, ear pain and sore throat.   Respiratory:  Negative for cough, shortness of breath and wheezing.   Cardiovascular:  Negative for chest pain, palpitations and leg swelling.  Gastrointestinal:  Negative for abdominal pain, blood in stool, constipation, diarrhea and nausea.  Endocrine: Negative for polydipsia.  Genitourinary:  Negative for dysuria, frequency, hematuria and urgency.  Musculoskeletal:  Negative for back pain,  myalgias and neck pain.  Skin:  Negative for rash.  Allergic/Immunologic: Negative for environmental allergies.  Neurological:  Negative for dizziness and headaches.  Hematological:  Does not bruise/bleed easily.  Psychiatric/Behavioral:  Positive for depression. Negative for decreased concentration and suicidal ideas. The patient is  not nervous/anxious and does not have insomnia.     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   Lumbar radiculopathy 06/29/2018   Chronic low back pain with sciatica 02/15/2018   Reactive airway disease, mild intermittent, uncomplicated [id number]   Chronic seasonal allergic rhinitis due to pollen 08/10/2017   Mixed hyperlipidemia 08/10/2017   Depression 08/10/2017   Class 1 obesity due to excess calories without serious comorbidity with body mass index (BMI) of 34.0 to 34.9 in adult 08/10/2017   Chronic cough 06/15/2017   Interstitial lung disease ([place]) 06/15/2017   Centrilobular emphysema ([place]) 04/02/2017   Lumbar disc disease 04/02/2017   Hepatic steatosis 04/02/2017   Atherosclerosis of coronary artery of native heart 04/02/2017   Cardiac pacemaker in situ 01/06/2017   Paroxysmal A-fib ([place]) 03/30/2016   Syncope and collapse 03/11/2016   Right leg weakness 03/11/2016   Essential hypertension 03/11/2016   Hyperglycemia 03/11/2016   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     Penicillins Other (See Comments)    Has patient had a PCN reaction causing immediate rash, facial/tongue/throat swelling, SOB or lightheadedness with hypotension: No Has patient had a PCN reaction causing severe rash involving mucus membranes or skin necrosis: No Has patient had a PCN reaction that required hospitalization No Has patient had a PCN reaction occurring within the last 10 years: No If all of the above answers are "NO", then may proceed with Cephalosporin use.     Past Surgical History:  Procedure Laterality Date   APPENDECTOMY     COLONOSCOPY  08/24/1998   COLONOSCOPY WITH PROPOFOL N/A 04/26/2015   Procedure: COLONOSCOPY WITH PROPOFOL;  Surgeon: Hulen Luster, MD;  Location: [facility] ENDOSCOPY;  Service: Gastroenterology;  Laterality: N/A;   COLONOSCOPY WITH PROPOFOL N/A 07/14/2021   Procedure: COLONOSCOPY WITH PROPOFOL;  Surgeon: [name], DO;  Location: [facility] ENDOSCOPY;  Service: Gastroenterology;  Laterality: N/A;   EYE SURGERY     fibroid tumors     fingers and wrist   FOOT SURGERY     TONSILLECTOMY     TRANSFORAMINAL LUMBAR INTERBODY FUSION (TLIF) WITH PEDICLE SCREW FIXATION 1 LEVEL N/A 06/29/2018   Procedure: TRANSFORAMINAL LUMBAR INTERBODY FUSION (TLIF) WITH PEDICLE SCREW FIXATION 1 LEVEL;  Surgeon: [name], MD;  Location: [facility] ORS;  Service: Neurosurgery;  Laterality: N/A;   VAGINAL HYSTERECTOMY  Social History   Tobacco Use   Smoking status: Former    Packs/day: 0.50    Years: 33.00    Total pack years: 16.50    Types: Cigarettes    Quit date: 2003    Years since quitting: 21.1   Smokeless tobacco: Never   Tobacco comments:    smoking cessation materials not required  Vaping Use   Vaping Use: Never used  Substance Use Topics   Alcohol use: Yes    Alcohol/week: 1.0 standard drink of alcohol    Types: 1 Glasses of wine per week    Comment:  occassional   Drug use: Never     Medication list has been reviewed and updated.  Current Meds  Medication Sig   albuterol (PROVENTIL) (2.5 MG/3ML) 0.083% nebulizer solution Take 3 mLs (2.5 mg total) by nebulization every 6 (six) hours as needed for wheezing or shortness of breath.   citalopram (CELEXA) 40 MG tablet Take 1 tablet (40 mg total) by mouth daily.   Fluticasone-Umeclidin-Vilant (TRELEGY ELLIPTA) 100-62.5-25 MCG/ACT AEPB Inhale 1 Inhaler into the lungs daily.   gabapentin (NEURONTIN) 300 MG capsule Take 2 capsules (600 mg total) by mouth 3 (three) times daily.   gemfibrozil (LOPID) 600 MG tablet Take 1 tablet (600 mg total) by mouth daily.   levothyroxine (SYNTHROID) 112 MCG tablet TAKE 1 TABLET BY MOUTH EVERY DAY   loratadine (CLARITIN) 10 MG tablet Take 1 tablet (10 mg total) by mouth daily.   losartan (COZAAR) 25 MG tablet Take 2 tablets (50 mg total) by mouth daily.   metoprolol succinate (TOPROL-XL) 50 MG 24 hr tablet Take 1 tablet (50 mg total) by mouth daily. TAKE WITH OR IMMEDIATELY FOLLOWING A MEAL.   montelukast (SINGULAIR) 10 MG tablet TAKE 1 TABLET BY MOUTH EVERY DAY   Multiple Vitamins-Minerals (CENTRUM SILVER 50+WOMEN PO) Take 1 tablet by mouth daily.   nystatin cream (MYCOSTATIN) APPLY TO AFFECTED AREA TWICE A DAY   rivaroxaban (XARELTO) 20 MG TABS tablet Take 20 mg by mouth daily with supper. Cardiologist [facility]   simvastatin (ZOCOR) 20 MG tablet Take 1 tablet (20 mg total) by mouth daily.   [DISCONTINUED] acetaminophen (TYLENOL) 500 MG tablet Take 1,000 mg by mouth every 8 (eight) hours as needed (pain).   Current Facility-Administered Medications for the 10/28/22 encounter (Office Visit) with Juline Patch, MD  Medication   albuterol (PROVENTIL) (2.5 MG/3ML) 0.083% nebulizer solution 2.5 mg   ipratropium-albuterol (DUONEB) 0.5-2.5 (3) MG/3ML nebulizer solution 3 mL       10/28/2022   10:12 AM 09/10/2022    3:21 PM 07/28/2022    9:23 AM 04/29/2022    9:48 AM   GAD 7 : Generalized Anxiety Score  Nervous, Anxious, on Edge 0 0 0 0  Control/stop worrying 0 0 0 0  Worry too much - different things 0 0 0 0  Trouble relaxing 0 0 0 0  Restless 0 0 0 0  Easily annoyed or irritable 0 0 0 0  Afraid - awful might happen 0 0 0 0  Total GAD 7 Score 0 0 0 0  Anxiety Difficulty Not difficult at all Not difficult at all Not difficult at all Not difficult at all       10/28/2022   10:12 AM 09/10/2022    3:21 PM 07/28/2022    9:23 AM  Depression screen PHQ 2/9  Decreased Interest 0 0 0  Down, Depressed, Hopeless 0 0 0  PHQ - 2 Score 0  0 0  Altered sleeping 0 0 0  Tired, decreased energy 0 0 0  Change in appetite 0 0 0  Feeling bad or failure about yourself  0 0 0  Trouble concentrating 0 0 0  Moving slowly or fidgety/restless 0 0 0  Suicidal thoughts 0 0 0  PHQ-9 Score 0 0 0  Difficult doing work/chores Not difficult at all Not difficult at all Not difficult at all    BP Readings from Last 3 Encounters:  10/28/22 (!) 160/108  09/10/22 124/79  08/31/22 (!) 123/93    Physical Exam Constitutional:      General: She is not irritable. HENT:     Right Ear: Tympanic membrane normal.     Left Ear: Tympanic membrane normal.     Nose: Nose normal. No congestion.     Mouth/Throat:     Mouth: Mucous membranes are moist.     Pharynx: No oropharyngeal exudate or posterior oropharyngeal erythema.  Eyes:     Pupils: Pupils are equal, round, and reactive to light.  Cardiovascular:     Heart sounds: No murmur heard.    No gallop.  Pulmonary:     Breath sounds: No wheezing, rhonchi or rales.  Abdominal:     General: Abdomen is flat. Bowel sounds are normal.     Tenderness: There is no abdominal tenderness.  Musculoskeletal:     Cervical back: Normal range of motion and neck supple.     Wt Readings from Last 3 Encounters:  10/28/22 183 lb (83 kg)  09/10/22 186 lb (84.4 kg)  08/31/22 187 lb (84.8 kg)    BP (!) 160/108 (BP Location: Left Arm,  Cuff Size: Large)   Pulse 82   Ht '5\' 5"'$  (1.651 m)   Wt 183 lb (83 kg)   SpO2 92%   BMI 30.45 kg/m   Assessment and Plan:  1. Depression, unspecified depression type Chronic.  Controlled.  Stable.  PHQ was 0 GAD score 0 continue citalopram 40 mg once a day. - citalopram (CELEXA) 40 MG tablet; Take 1 tablet (40 mg total) by mouth daily.  Dispense: 90 tablet; Refill: 1  2. Mixed hyperlipidemia Chronic.  Controlled.  Stable.  Continue gemfibrozil 600 mg once a day and simvastatin 20 mg once a day.  Will check lipid panel for current status of LDL. - gemfibrozil (LOPID) 600 MG tablet; Take 1 tablet (600 mg total) by mouth daily.  Dispense: 90 tablet; Refill: 1 - simvastatin (ZOCOR) 20 MG tablet; Take 1 tablet (20 mg total) by mouth daily.  Dispense: 90 tablet; Refill: 1 - Lipid Panel With LDL/HDL Ratio  3. Familial multiple lipoprotein-type hyperlipidemia .  Controlled.  Stable.  Continue gemfibrozil 600 mg daily. - gemfibrozil (LOPID) 600 MG tablet; Take 1 tablet (600 mg total) by mouth daily.  Dispense: 90 tablet; Refill: 1 - Lipid Panel With LDL/HDL Ratio  4. Hypothyroidism, unspecified type Chronic.  Controlled.  Stable.  Pending thyroid panel with TSH.  Likely will will continue 112 mcg daily.  Will recheck in 6 months. - levothyroxine (SYNTHROID) 112 MCG tablet; Take 1 tablet (112 mcg total) by mouth daily.  Dispense: 90 tablet; Refill: 1 - Thyroid Panel With TSH  5. Chronic seasonal allergic rhinitis due to pollen .  Controlled.  Stable.  Continue loratadine 10 mg once a day and Singulair 10 mg once a day. - loratadine (CLARITIN) 10 MG tablet; Take 1 tablet (10 mg total) by mouth daily.  Dispense: 90 tablet; Refill: 1 -  montelukast (SINGULAIR) 10 MG tablet; TAKE 1 TABLET BY MOUTH EVERY DAY  Dispense: 90 tablet; Refill: 1  6. Essential (primary) hypertension Chronic.  Uncontrolled.  Stable.  Blood pressure 160/108.  Patient said that she took it her medicine about 2 hours  previous.  We will continue metoprolol XL 50 mg once a day.  We will discontinue losartan and replace it with valsartan 160 mg once a day.  We will recheck blood pressure in 4 weeks.  In the meantime we will check CMP for electrolytes and GFR. - metoprolol succinate (TOPROL-XL) 50 MG 24 hr tablet; Take 1 tablet (50 mg total) by mouth daily. TAKE WITH OR IMMEDIATELY FOLLOWING A MEAL.  Dispense: 90 tablet; Refill: 1 - Comprehensive Metabolic Panel (CMET) - valsartan (DIOVAN) 160 MG tablet; Take 1 tablet (160 mg total) by mouth daily.  Dispense: 90 tablet; Refill: 0  7. Reactive airway disease, mild intermittent, uncomplicated Chronic.  Controlled.  Stable.  Continue Singulair 10 mg once a day. - montelukast (SINGULAIR) 10 MG tablet; TAKE 1 TABLET BY MOUTH EVERY DAY  Dispense: 90 tablet; Refill: 1  8. Nausea Nausea currently controlled with no progression of symptoms  9. Right upper quadrant abdominal pain Right upper quadrant discomfort is currently controlled with no progression of symptoms.   Otilio Miu, MD

## 2022-10-29 ENCOUNTER — Ambulatory Visit: Payer: Self-pay

## 2022-10-29 ENCOUNTER — Other Ambulatory Visit: Payer: Self-pay

## 2022-10-29 DIAGNOSIS — E039 Hypothyroidism, unspecified: Secondary | ICD-10-CM

## 2022-10-29 LAB — LIPID PANEL WITH LDL/HDL RATIO
Cholesterol, Total: 152 mg/dL (ref 100–199)
HDL: 50 mg/dL (ref >39)
LDL Chol Calc (NIH): 86 mg/dL (ref 0–99)
LDL/HDL Ratio: 1.7 ratio (ref 0.0–3.2)
Triglycerides: 87 mg/dL (ref 0–149)
VLDL Cholesterol Cal: 16 mg/dL (ref 5–40)

## 2022-10-29 LAB — COMPREHENSIVE METABOLIC PANEL
ALT: 19 IU/L (ref 0–32)
AST: 22 IU/L (ref 0–40)
Albumin/Globulin Ratio: 2 (ref 1.2–2.2)
Albumin: 3.9 g/dL (ref 3.8–4.8)
Alkaline Phosphatase: 129 IU/L — ABNORMAL HIGH (ref 44–121)
BUN/Creatinine Ratio: 25 (ref 12–28)
BUN: 19 mg/dL (ref 8–27)
Bilirubin Total: 0.6 mg/dL (ref 0.0–1.2)
CO2: 21 mmol/L (ref 20–29)
Calcium: 9.5 mg/dL (ref 8.7–10.3)
Chloride: 108 mmol/L — ABNORMAL HIGH (ref 96–106)
Creatinine, Ser: 0.76 mg/dL (ref 0.57–1.00)
Globulin, Total: 2 g/dL (ref 1.5–4.5)
Glucose: 102 mg/dL — ABNORMAL HIGH (ref 70–99)
Potassium: 4.9 mmol/L (ref 3.5–5.2)
Sodium: 143 mmol/L (ref 134–144)
Total Protein: 5.9 g/dL — ABNORMAL LOW (ref 6.0–8.5)
eGFR: 82 mL/min/{1.73_m2} (ref >59)

## 2022-10-29 LAB — THYROID PANEL WITH TSH
Free Thyroxine Index: 2.6 (ref 1.2–4.9)
T3 Uptake Ratio: 29 % (ref 24–39)
T4, Total: 8.8 ug/dL (ref 4.5–12.0)
TSH: 0.099 u[IU]/mL — ABNORMAL LOW (ref 0.450–4.500)

## 2022-10-29 MED ORDER — LEVOTHYROXINE SODIUM 100 MCG PO TABS: 100.0000 ug | ORAL_TABLET | Freq: Every day | ORAL | 3 refills | Status: DC

## 2022-10-29 NOTE — Progress Notes (Signed)
PC to pt, left detailed message per DPR. Sent new RX in.

## 2022-10-29 NOTE — Telephone Encounter (Signed)
3rd attempt, Patient called, left VM to return the call to the office to discuss medication with a nurse. Unable to reach patient after 3 attempts by [facility] NT, routing to the provider for resolution per protocol.

## 2022-10-29 NOTE — Telephone Encounter (Signed)
Patient called, left VM to return the call to the office to discuss medication with a nurse.  Summary: Medication Advice   Pt is calling to ask TSH was low why was the script for levothyroxine (SYNTHROID) 100 MCG tablet [id number] lowered? Please advise

## 2022-10-29 NOTE — Telephone Encounter (Signed)
2nd attempt, Patient called, left VM to return the call to the office to discuss medication with a nurse.

## 2022-11-20 DIAGNOSIS — Z950 Presence of cardiac pacemaker: Secondary | ICD-10-CM | POA: Diagnosis not present

## 2022-11-23 DIAGNOSIS — Z950 Presence of cardiac pacemaker: Secondary | ICD-10-CM | POA: Diagnosis not present

## 2022-11-25 ENCOUNTER — Ambulatory Visit (INDEPENDENT_AMBULATORY_CARE_PROVIDER_SITE_OTHER): Payer: Medicare Other | Admitting: Family Medicine

## 2022-11-25 ENCOUNTER — Encounter: Payer: Self-pay | Admitting: Family Medicine

## 2022-11-25 VITALS — BP 120/72 | HR 76 | Ht 65.0 in | Wt 184.0 lb

## 2022-11-25 DIAGNOSIS — I10 Essential (primary) hypertension: Secondary | ICD-10-CM | POA: Diagnosis not present

## 2022-11-25 MED ORDER — VALSARTAN 160 MG PO TABS: 160.0000 mg | ORAL_TABLET | Freq: Every day | ORAL | 1 refills | Status: DC

## 2022-11-25 MED ORDER — METOPROLOL SUCCINATE ER 50 MG PO TB24: 50.0000 mg | ORAL_TABLET | Freq: Every day | ORAL | 1 refills | Status: DC

## 2022-11-25 NOTE — Progress Notes (Signed)
Date:  11/25/2022   Name:  Danielle Taylor   DOB:  Apr 16, 1946   MRN:  [id number]   Chief Complaint: No chief complaint on file.  Hypertension This is a chronic problem. The current episode started more than 1 year ago. The problem has been gradually improving since onset. The problem is controlled. Pertinent negatives include no anxiety, blurred vision, chest pain, headaches, neck pain, orthopnea, palpitations, PND or shortness of breath. There are no associated agents to hypertension. There are no known risk factors for coronary artery disease. Past treatments include angiotensin blockers and beta blockers. The current treatment provides moderate improvement. There are no compliance problems.  There is no history of CAD/MI or CVA. There is no history of chronic renal disease, a hypertension causing med or renovascular disease.    Lab Results  Component Value Date   NA 143 10/28/2022   K 4.9 10/28/2022   CO2 21 10/28/2022   GLUCOSE 102 (H) 10/28/2022   BUN 19 10/28/2022   CREATININE 0.76 10/28/2022   CALCIUM 9.5 10/28/2022   EGFR 82 10/28/2022   GFRNONAA >60 08/31/2022   Lab Results  Component Value Date   CHOL 152 10/28/2022   HDL 50 10/28/2022   LDLCALC 86 10/28/2022   TRIG 87 10/28/2022   CHOLHDL 3.5 02/08/2017   Lab Results  Component Value Date   TSH 0.099 (L) 10/28/2022   Lab Results  Component Value Date   HGBA1C 6.3 (H) 03/11/2016   Lab Results  Component Value Date   WBC 11.4 (H) 08/31/2022   HGB 12.0 08/31/2022   HCT 38.2 08/31/2022   MCV 94.6 08/31/2022   PLT 486 (H) 08/31/2022   Lab Results  Component Value Date   ALT 19 10/28/2022   AST 22 10/28/2022   ALKPHOS 129 (H) 10/28/2022   BILITOT 0.6 10/28/2022   No results found for: "25OHVITD2", "25OHVITD3", "VD25OH"   Review of Systems  Eyes:  Negative for blurred vision.  Respiratory:  Negative for shortness of breath.   Cardiovascular:  Negative for chest pain, palpitations, orthopnea and  PND.  Musculoskeletal:  Negative for neck pain.  Neurological:  Negative for headaches.    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   Lumbar radiculopathy 06/29/2018   Chronic low back pain with sciatica 02/15/2018   Reactive airway disease, mild intermittent, uncomplicated [id number]   Chronic seasonal allergic rhinitis due to pollen 08/10/2017   Mixed hyperlipidemia 08/10/2017   Depression 08/10/2017   Class 1 obesity due to excess calories without serious comorbidity with body mass index (BMI) of 34.0 to 34.9 in adult 08/10/2017   Chronic cough 06/15/2017   Interstitial lung disease 06/15/2017   Centrilobular emphysema 04/02/2017   Lumbar disc disease 04/02/2017   Hepatic steatosis 04/02/2017   Atherosclerosis of coronary artery of native heart 04/02/2017   Cardiac pacemaker in situ 01/06/2017   Paroxysmal A-fib 03/30/2016   Syncope and collapse 03/11/2016   Right leg weakness 03/11/2016   Essential hypertension 03/11/2016   Hyperglycemia 03/11/2016   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     Penicillins Other (See Comments)    Has patient had a PCN reaction causing immediate rash, facial/tongue/throat swelling, SOB or lightheadedness with hypotension: No Has patient had a PCN reaction causing severe rash involving mucus membranes or skin necrosis: No Has patient had a PCN reaction that required hospitalization No Has patient had a PCN reaction occurring within the last 10 years: No If all of the above answers are "NO", then may proceed with Cephalosporin use.     Past Surgical History:  Procedure Laterality Date   APPENDECTOMY     COLONOSCOPY  08/24/1998   COLONOSCOPY WITH PROPOFOL N/A 04/26/2015   Procedure: COLONOSCOPY WITH PROPOFOL;  Surgeon: Hulen Luster, MD;  Location: [facility] ENDOSCOPY;  Service: Gastroenterology;  Laterality: N/A;   COLONOSCOPY WITH PROPOFOL N/A 07/14/2021   Procedure: COLONOSCOPY WITH PROPOFOL;  Surgeon: [name], DO;  Location: [facility] ENDOSCOPY;  Service: Gastroenterology;  Laterality: N/A;   EYE SURGERY     fibroid tumors     fingers and wrist   FOOT SURGERY     TONSILLECTOMY     TRANSFORAMINAL LUMBAR INTERBODY FUSION (TLIF) WITH PEDICLE SCREW FIXATION 1 LEVEL N/A 06/29/2018   Procedure: TRANSFORAMINAL LUMBAR INTERBODY FUSION (TLIF) WITH PEDICLE SCREW FIXATION 1 LEVEL;  Surgeon: [name], MD;  Location: [facility] ORS;  Service: Neurosurgery;  Laterality: N/A;   VAGINAL HYSTERECTOMY      Social History   Tobacco Use   Smoking status: Former    Packs/day: 0.50    Years: 33.00    Additional pack years: 0.00    Total pack years: 16.50    Types: Cigarettes    Quit date: 2003    Years since quitting: 21.2   Smokeless tobacco: Never   Tobacco comments:    smoking cessation materials not required  Vaping Use   Vaping Use: Never used  Substance Use Topics   Alcohol use: Yes    Alcohol/week: 1.0 standard drink of alcohol     Types: 1 Glasses of wine per week    Comment: occassional   Drug use: Never     Medication list has been reviewed and updated.  No outpatient medications have been marked as taking for the 11/25/22 encounter (Office Visit) with Juline Patch, MD.   Current Facility-Administered Medications for the 11/25/22 encounter (Office Visit) with Juline Patch, MD  Medication   albuterol (PROVENTIL) (2.5 MG/3ML) 0.083% nebulizer solution 2.5 mg   ipratropium-albuterol (DUONEB) 0.5-2.5 (3) MG/3ML nebulizer solution 3 mL       10/28/2022   10:12 AM 09/10/2022    3:21 PM 07/28/2022    9:23 AM 04/29/2022    9:48 AM  GAD 7 : Generalized Anxiety Score  Nervous, Anxious, on Edge 0 0 0 0  Control/stop worrying 0 0 0 0  Worry too much - different things 0 0 0 0  Trouble relaxing 0 0 0 0  Restless 0 0 0 0  Easily annoyed or irritable 0 0 0 0  Afraid - awful might happen 0 0 0 0  Total  GAD 7 Score 0 0 0 0  Anxiety Difficulty Not difficult at all Not difficult at all Not difficult at all Not difficult at all       10/28/2022   10:12 AM 09/10/2022    3:21 PM 07/28/2022    9:23 AM  Depression screen PHQ 2/9  Decreased Interest 0 0 0  Down, Depressed, Hopeless 0 0 0  PHQ - 2 Score 0 0 0  Altered sleeping 0 0 0  Tired, decreased energy 0 0 0  Change in appetite 0 0 0  Feeling bad or failure about yourself  0 0 0  Trouble concentrating 0 0 0  Moving slowly or fidgety/restless 0 0 0  Suicidal thoughts 0 0 0  PHQ-9 Score 0 0 0  Difficult doing work/chores Not difficult at all Not difficult at all Not difficult at all    BP Readings from Last 3 Encounters:  10/28/22 (!) 160/108  09/10/22 124/79  08/31/22 (!) 123/93    Physical Exam Vitals and nursing note reviewed.  Constitutional:      Appearance: She is well-developed.  HENT:     Head: Normocephalic.     Right Ear: Tympanic membrane and external ear normal.     Left Ear: Tympanic membrane and external ear normal.     Nose: Nose normal.      Mouth/Throat:     Mouth: Mucous membranes are moist.  Eyes:     General: Lids are everted, no foreign bodies appreciated. No scleral icterus.       Left eye: No foreign body or hordeolum.     Conjunctiva/sclera: Conjunctivae normal.     Right eye: Right conjunctiva is not injected.     Left eye: Left conjunctiva is not injected.     Pupils: Pupils are equal, round, and reactive to light.  Neck:     Thyroid: No thyromegaly.     Vascular: No JVD.     Trachea: No tracheal deviation.  Cardiovascular:     Rate and Rhythm: Normal rate and regular rhythm.     Heart sounds: Normal heart sounds. No murmur heard.    No friction rub. No gallop.  Pulmonary:     Effort: Pulmonary effort is normal. No respiratory distress.     Breath sounds: Normal breath sounds. No wheezing, rhonchi or rales.  Abdominal:     General: Bowel sounds are normal.     Palpations: Abdomen is soft. There is no mass.     Tenderness: There is no abdominal tenderness. There is no guarding or rebound.  Musculoskeletal:        General: No tenderness. Normal range of motion.     Cervical back: Normal range of motion and neck supple.  Lymphadenopathy:     Cervical: No cervical adenopathy.  Skin:    General: Skin is warm.     Findings: No bruising, erythema or rash.  Neurological:     Mental Status: She is alert and oriented to person, place, and time.     Cranial Nerves: No cranial nerve deficit.     Deep Tendon Reflexes: Reflexes normal.  Psychiatric:        Mood and Affect: Mood is not anxious or depressed.     Wt Readings from Last 3 Encounters:  11/25/22 184 lb (83.5 kg)  10/28/22 183 lb (83 kg)  09/10/22 186 lb (84.4 kg)    Ht 5\' 5"  (1.651 m)   Wt 184 lb (83.5 kg)   BMI 30.62  kg/m   Assessment and Plan:  1. Essential (primary) hypertension Chronic.  Controlled.  Stable.  Blood pressure 120/72.  Continue metoprolol XL 50 mg once a day and valsartan 160 mg once a day.  Will recheck in 6 months.   Review of CMP electrolytes are acceptable as well as GFR. - metoprolol succinate (TOPROL-XL) 50 MG 24 hr tablet; Take 1 tablet (50 mg total) by mouth daily. TAKE WITH OR IMMEDIATELY FOLLOWING A MEAL.  Dispense: 90 tablet; Refill: 1 - valsartan (DIOVAN) 160 MG tablet; Take 1 tablet (160 mg total) by mouth daily.  Dispense: 90 tablet; Refill: 1    Otilio Miu, MD

## 2022-11-26 ENCOUNTER — Ambulatory Visit (INDEPENDENT_AMBULATORY_CARE_PROVIDER_SITE_OTHER): Payer: Medicare Other

## 2022-11-26 VITALS — Ht 65.0 in | Wt 184.0 lb

## 2022-11-26 DIAGNOSIS — Z0000 Encounter for general adult medical examination without abnormal findings: Secondary | ICD-10-CM | POA: Diagnosis not present

## 2022-11-26 DIAGNOSIS — Z1231 Encounter for screening mammogram for malignant neoplasm of breast: Secondary | ICD-10-CM

## 2022-11-26 NOTE — Progress Notes (Signed)
I connected with  Danielle Taylor [facility] on 11/26/22 by a audio enabled telemedicine application and verified that I am speaking with the correct person using two identifiers.  Patient Location: Home  Provider Location: Office/Clinic  I discussed the limitations of evaluation and management by telemedicine. The patient expressed understanding and agreed to proceed.  Subjective:   Danielle Taylor is a 77 y.o. female who presents for Medicare Annual (Subsequent) preventive examination.  Review of Systems     Cardiac Risk Factors include: advanced age (>44men, >38 women);hypertension;dyslipidemia     Objective:    There were no vitals filed for this visit. There is no height or weight on file to calculate BMI.     11/26/2022    9:05 AM 08/31/2022   10:19 AM 11/17/2021   10:08 AM 07/14/2021    7:54 AM 11/06/2020   10:17 AM 01/25/2019   11:47 AM 06/29/2018    5:00 PM  Advanced Directives  Does Patient Have a Medical Advance Directive? Yes No Yes Yes Yes Yes Yes  Type of Paramedic of [place];Living will  [place];Living will [place];Living will [place];Living will Living will;Healthcare Power of [place]  Does patient want to make changes to medical advance directive? No - Patient declined      No - Patient declined  Copy of [place] in Chart? Yes - validated most recent copy scanned in chart (See row information)  Yes - validated most recent copy scanned in chart (See row information)  Yes - validated most recent copy scanned in chart (See row information) Yes - validated most recent copy scanned in chart (See row information) No - copy requested    Current Medications (verified) Outpatient Encounter Medications as of 11/26/2022  Medication Sig   albuterol (PROVENTIL) (2.5 MG/3ML) 0.083% nebulizer solution Take 3 mLs (2.5 mg total) by nebulization  every 6 (six) hours as needed for wheezing or shortness of breath.   citalopram (CELEXA) 40 MG tablet Take 1 tablet (40 mg total) by mouth daily.   Fluticasone-Umeclidin-Vilant (TRELEGY ELLIPTA) 100-62.5-25 MCG/ACT AEPB Inhale 1 Inhaler into the lungs daily.   gabapentin (NEURONTIN) 300 MG capsule Take 2 capsules (600 mg total) by mouth 3 (three) times daily.   gemfibrozil (LOPID) 600 MG tablet Take 1 tablet (600 mg total) by mouth daily.   levothyroxine (SYNTHROID) 100 MCG tablet Take 1 tablet (100 mcg total) by mouth daily.   levothyroxine (SYNTHROID) 112 MCG tablet Take 1 tablet (112 mcg total) by mouth daily.   loratadine (CLARITIN) 10 MG tablet Take 1 tablet (10 mg total) by mouth daily.   metoprolol succinate (TOPROL-XL) 50 MG 24 hr tablet Take 1 tablet (50 mg total) by mouth daily. TAKE WITH OR IMMEDIATELY FOLLOWING A MEAL.   montelukast (SINGULAIR) 10 MG tablet TAKE 1 TABLET BY MOUTH EVERY DAY   Multiple Vitamins-Minerals (CENTRUM SILVER 50+WOMEN PO) Take 1 tablet by mouth daily.   nystatin cream (MYCOSTATIN) APPLY TO AFFECTED AREA TWICE A DAY   omeprazole (PRILOSEC) 20 MG capsule Take 20 mg by mouth daily.   pantoprazole (PROTONIX) 40 MG tablet Take 1 tablet (40 mg total) by mouth daily.   rivaroxaban (XARELTO) 20 MG TABS tablet Take 20 mg by mouth daily with supper. Cardiologist [facility]   simvastatin (ZOCOR) 20 MG tablet Take 1 tablet (20 mg total) by mouth daily.   valsartan (DIOVAN) 160 MG tablet Take 1 tablet (160  mg total) by mouth daily.   ondansetron (ZOFRAN) 4 MG tablet Take 1 tablet (4 mg total) by mouth every 8 (eight) hours as needed for nausea or vomiting. (Patient not taking: Reported on 11/25/2022)   Facility-Administered Encounter Medications as of 11/26/2022  Medication   albuterol (PROVENTIL) (2.5 MG/3ML) 0.083% nebulizer solution 2.5 mg   ipratropium-albuterol (DUONEB) 0.5-2.5 (3) MG/3ML nebulizer solution 3 mL    Allergies (verified) Baclofen and Penicillins    History: Past Medical History:  Diagnosis Date   Allergy    Chronic gastritis    Chronic low back pain with sciatica    COPD (chronic obstructive pulmonary disease)    Depression    Diverticulosis    Dysrhythmia    Complete Heart Block   Gastritis, chronic    GERD (gastroesophageal reflux disease)    Graves disease    H/O hepatitis    Hallux valgus of right foot    Hammer toe    History of hiatal hernia    History of shingles    Hyperlipidemia    Hypertension    Hypothyroidism    Incontinence    bladder and bowel   Paroxysmal atrial fibrillation    Presence of permanent cardiac pacemaker    2012   Stroke    Past Surgical History:  Procedure Laterality Date   APPENDECTOMY     COLONOSCOPY  08/24/1998   COLONOSCOPY WITH PROPOFOL N/A 04/26/2015   Procedure: COLONOSCOPY WITH PROPOFOL;  Surgeon: Hulen Luster, MD;  Location: [facility] ENDOSCOPY;  Service: Gastroenterology;  Laterality: N/A;   COLONOSCOPY WITH PROPOFOL N/A 07/14/2021   Procedure: COLONOSCOPY WITH PROPOFOL;  Surgeon: [name], DO;  Location[facility] END[facility]COPY;  Service: Gastroenterology;  Laterality: N/A;   EYE SURGERY     fibroid tumors     fingers and wrist   FOOT SURGERY     TONSILLECTOMY     TRANSFORAMINAL LUMBAR INTERBODY FUSION (TLIF) WITH PEDICLE SCREW FIXATION 1 LEVEL N/A 06/29/2018   Procedure: TRANSFORAMINAL LUMBAR INTERBODY FUSION (TLIF) WITH PEDICLE SCREW FIXATION 1 LEVEL;  Surgeon: [name], MD;  Location: [facility] ORS;  Service: Neurosurgery;  Laterality: N/A;   VAGINAL HYSTERECTOMY     Family History  Problem Relation Age of Onset   Dementia Mother    Heart disease Father    Diabetes Sister    Breast cancer Neg Hx    Social History   Socioeconomic History   Marital status: Married    Spouse name: Not on file   Number of children: 2   Years of education: Not on file   Highest education level: 12th grade  Occupational History   Occupation: Retired  Tobacco Use   Smoking  status: Former    Packs/day: 0.50    Years: 33.00    Additional pack years: 0.00    Total pack years: 16.50    Types: Cigarettes    Quit date: 2003    Years since quitting: 21.2   Smokeless tobacco: Never   Tobacco comments:    smoking cessation materials not required  Vaping Use   Vaping Use: Never used  Substance and Sexual Activity   Alcohol use: Yes    Alcohol/week: 1.0 standard drink of alcohol    Types: 1 Glasses of wine per week    Comment: occassional   Drug use: Never   Sexual activity: Not Currently  Other Topics Concern   Not on file  Social History Narrative   Not on file   Social Determinants  of Health   Financial Resource Strain: Low Risk  (11/26/2022)   Overall Financial Resource Strain (CARDIA)    Difficulty of Paying Living Expenses: Not hard at all  Food Insecurity: No Food Insecurity (11/26/2022)   Hunger Vital Sign    Worried About Running Out of Food in the Last Year: Never true    Ran Out of Food in the Last Year: Never true  Transportation Needs: No Transportation Needs (11/26/2022)   PRAPARE - Hydrologist (Medical): No    Lack of Transportation (Non-Medical): No  Physical Activity: Inactive (11/26/2022)   Exercise Vital Sign    Days of Exercise per Week: 0 days    Minutes of Exercise per Session: 0 min  Stress: No Stress Concern Present (11/26/2022)   [place]    Feeling of Stress : Not at all  Social Connections: Moderately Integrated (11/26/2022)   Social Connection and Isolation Panel [NHANES]    Frequency of Communication with Friends and Family: More than three times a week    Frequency of Social Gatherings with Friends and Family: More than three times a week    Attends Religious Services: More than 4 times per year    Active Member of [employer] or Organizations: No    Attends Music therapist: Never    Marital Status: Married    Tobacco  Counseling Counseling given: Not Answered Tobacco comments: smoking cessation materials not required   Clinical Intake:  Pre-visit preparation completed: Yes  Pain : No/denies pain     Nutritional Risks: None Diabetes: No  How often do you need to have someone help you when you read instructions, pamphlets, or other written materials from your doctor or pharmacy?: 1 - Never  Diabetic?no  Interpreter Needed?: No  Information entered by :: Kirke Shaggy, LPN   Activities of Daily Living    11/26/2022    9:06 AM  In your present state of health, do you have any difficulty performing the following activities:  Hearing? 0  Vision? 0  Difficulty concentrating or making decisions? 0  Walking or climbing stairs? 1  Dressing or bathing? 0  Doing errands, shopping? 0  Preparing Food and eating ? N  Using the Toilet? N  In the past six months, have you accidently leaked urine? N  Do you have problems with loss of bowel control? N  Managing your Medications? N  Managing your Finances? N  Housekeeping or managing your Housekeeping? N    Patient Care Team: Juline Patch, MD as PCP - General (Family Medicine) March Rummage, MD as Consulting Physician (Cardiology) Erby Pian, MD as Referring Physician (Specialist) [name], DO as Consulting Physician (Gastroenterology)  Indicate any recent Medical Services you may have received from other than [facility] providers in the past year (date may be approximate).     Assessment:   This is a routine wellness examination for Denesa.  Hearing/Vision screen Hearing Screening - Comments:: No aids Vision Screening - Comments:: Wears glasses- Dr.Kim  Dietary issues and exercise activities discussed: Current Exercise Habits: The patient does not participate in regular exercise at present   Goals Addressed             This Visit's Progress    DIET - EAT MORE FRUITS AND VEGETABLES         Depression  Screen    11/26/2022    9:03 AM 11/25/2022  10:23 AM 10/28/2022   10:12 AM 09/10/2022    3:21 PM 07/28/2022    9:23 AM 04/29/2022    9:48 AM 04/13/2022    2:38 PM  PHQ 2/9 Scores  PHQ - 2 Score 0 0 0 0 0 0 0  PHQ- 9 Score 0 0 0 0 0 0 0    Fall Risk    11/26/2022    9:06 AM 10/28/2022   10:12 AM 09/10/2022    3:20 PM 07/28/2022    9:23 AM 04/29/2022    9:47 AM  Fall Risk   Falls in the past year? 0 0 0 0 1  Number falls in past yr: 0 0 0 0 0  Injury with Fall? 0 0 0 0 0  Risk for fall due to : No Fall Risks No Fall Risks Impaired balance/gait No Fall Risks Impaired balance/gait  Follow up Falls prevention discussed;Falls evaluation completed Falls evaluation completed Falls prevention discussed;Falls evaluation completed Falls evaluation completed Falls evaluation completed    FALL RISK PREVENTION PERTAINING TO THE HOME:  Any stairs in or around the home? Yes  If so, are there any without handrails? No  Home free of loose throw rugs in walkways, pet beds, electrical cords, etc? Yes  Adequate lighting in your home to reduce risk of falls? Yes   ASSISTIVE DEVICES UTILIZED TO PREVENT FALLS:  Life alert? No  Use of a cane, walker or w/c? Yes - cane Grab bars in the bathroom? Yes  Shower chair or bench in shower? No  Elevated toilet seat or a handicapped toilet? No    Cognitive Function:        11/26/2022    9:11 AM 01/25/2019   11:50 AM 11/15/2017   11:35 AM  6CIT Screen  What Year? 0 points 0 points 0 points  What month? 0 points 0 points 0 points  What time? 0 points 0 points 0 points  Count back from 20 0 points 0 points 0 points  Months in reverse 0 points 0 points 0 points  Repeat phrase 0 points 0 points 0 points  Total Score 0 points 0 points 0 points    Immunizations Immunization History  Administered Date(s) Administered   COVID-19, mRNA, vaccine(Comirnaty)12 years and older 07/22/2022   Fluad Quad(high Dose 65+) 05/12/2019, 05/06/2021   Influenza Nasal 06/08/2017    Influenza, High Dose Seasonal PF 06/11/2017, 05/03/2018, 05/25/2020   Influenza, Seasonal, Injecte, Preservative Fre 05/28/2011, 06/21/2012   Influenza,inj,Quad PF,6+ Mos 07/15/2015, 05/28/2016, 04/29/2022   Influenza-Unspecified 05/24/2014, 07/15/2015, 05/28/2016   PFIZER Comirnaty(Gray Top)Covid-19 Tri-Sucrose Vaccine 03/28/2021   PFIZER(Purple Top)SARS-COV-2 Vaccination 10/14/2019, 11/07/2019, 08/13/2020   PPD Test 04/05/2017   Pfizer Covid-19 Vaccine Bivalent Booster 41yrs & up 09/12/2021   Pneumococcal Conjugate-13 10/22/2014   Pneumococcal Polysaccharide-23 11/15/2017   Tdap 06/08/2020    TDAP status: Up to date  Flu Vaccine status: Up to date  Pneumococcal vaccine status: Up to date  Covid-19 vaccine status: Completed vaccines  Qualifies for Shingles Vaccine? Yes   Zostavax completed No   Shingrix Completed?: No.    Education has been provided regarding the importance of this vaccine. Patient has been advised to call insurance company to determine out of pocket expense if they have not yet received this vaccine. Advised may also receive vaccine at local pharmacy or Health Dept. Verbalized acceptance and understanding.  Screening Tests Health Maintenance  Topic Date Due   COVID-19 Vaccine (7 - 2023-24 season) 12/11/2022 (Originally 09/16/2022)   Zoster Vaccines-  Shingrix (1 of 2) 01/28/2023 (Originally 07/03/1965)   MAMMOGRAM  11/25/2023 (Originally 12/17/2021)   INFLUENZA VACCINE  03/25/2023   Medicare Annual Wellness (AWV)  11/26/2023   COLONOSCOPY (Pts 45-17yrs Insurance coverage will need to be confirmed)  07/14/2026   DTaP/Tdap/Td (2 - Td or Tdap) 06/08/2030   Pneumonia Vaccine 54+ Years old  Completed   DEXA SCAN  Completed   Hepatitis C Screening  Completed   HPV VACCINES  Aged Out    Health Maintenance  There are no preventive care reminders to display for this patient.  Colorectal cancer screening: Type of screening: Colonoscopy. Completed 07/14/21. Repeat  every 5 years  Mammogram status: Completed 12/17/20. Repeat every year  Bone Density status: Completed 12/17/20. Results reflect: Bone density results: NORMAL. Repeat every 5 years.  Lung Cancer Screening: (Low Dose CT Chest recommended if Age 50-80 years, 30 pack-year currently smoking OR have quit w/in 15years.) does not qualify.   Additional Screening:  Hepatitis C Screening: does qualify; Completed 09/10/22  Vision Screening: Recommended annual ophthalmology exams for early detection of glaucoma and other disorders of the eye. Is the patient up to date with their annual eye exam?  Yes  Who is the provider or what is the name of the office in which the patient attends annual eye exams? Dr.Kim If pt is not established with a provider, would they like to be referred to a provider to establish care? No .   Dental Screening: Recommended annual dental exams for proper oral hygiene  Community Resource Referral / Chronic Care Management: CRR required this visit?  No   CCM required this visit?  No      Plan:     I have personally reviewed and noted the following in the patient's chart:   Medical and social history Use of alcohol, tobacco or illicit drugs  Current medications and supplements including opioid prescriptions. Patient is not currently taking opioid prescriptions. Functional ability and status Nutritional status Physical activity Advanced directives List of other physicians Hospitalizations, surgeries, and ER visits in previous 12 months Vitals Screenings to include cognitive, depression, and falls Referrals and appointments  In addition, I have reviewed and discussed with patient certain preventive protocols, quality metrics, and best practice recommendations. A written personalized care plan for preventive services as well as general preventive health recommendations were provided to patient.     Dionisio David, LPN   [id number]   Nurse Notes: none

## 2022-11-26 NOTE — Patient Instructions (Signed)
Danielle Taylor , Thank you for taking time to come for your Medicare Wellness Visit. I appreciate your ongoing commitment to your health goals. Please review the following plan we discussed and let me know if I can assist you in the future.   These are the goals we discussed:  Goals      DIET - EAT MORE FRUITS AND VEGETABLES     DIET - INCREASE WATER INTAKE     Recommend to drink at least 6-8 8oz glasses of water per day.     Patient Stated     Resolve issues with bowel and bladder that occurred after lumbar surgery.      Pharmacy Care Plan        This is a list of the screening recommended for you and due dates:  Health Maintenance  Topic Date Due   COVID-19 Vaccine (7 - 2023-24 season) 12/11/2022*   Zoster (Shingles) Vaccine (1 of 2) 01/28/2023*   Mammogram  11/25/2023*   Flu Shot  03/25/2023   Medicare Annual Wellness Visit  11/26/2023   Colon Cancer Screening  07/14/2026   DTaP/Tdap/Td vaccine (2 - Td or Tdap) 06/08/2030   Pneumonia Vaccine  Completed   DEXA scan (bone density measurement)  Completed   Hepatitis C Screening: USPSTF Recommendation to screen - Ages 33-79 yo.  Completed   HPV Vaccine  Aged Out  *Topic was postponed. The date shown is not the original due date.    Advanced directives: yes  Conditions/risks identified: none  Next appointment: Follow up in one year for your annual wellness visit 12/01/23 @ 3:00 pm by phone   Preventive Care 65 Years and Older, Female Preventive care refers to lifestyle choices and visits with your health care provider that can promote health and wellness. What does preventive care include? A yearly physical exam. This is also called an annual well check. Dental exams once or twice a year. Routine eye exams. Ask your health care provider how often you should have your eyes checked. Personal lifestyle choices, including: Daily care of your teeth and gums. Regular physical activity. Eating a healthy diet. Avoiding tobacco  and drug use. Limiting alcohol use. Practicing safe sex. Taking low-dose aspirin every day. Taking vitamin and mineral supplements as recommended by your health care provider. What happens during an annual well check? The services and screenings done by your health care provider during your annual well check will depend on your age, overall health, lifestyle risk factors, and family history of disease. Counseling  Your health care provider may ask you questions about your: Alcohol use. Tobacco use. Drug use. Emotional well-being. Home and relationship well-being. Sexual activity. Eating habits. History of falls. Memory and ability to understand (cognition). Work and work Statistician. Reproductive health. Screening  You may have the following tests or measurements: Height, weight, and BMI. Blood pressure. Lipid and cholesterol levels. These may be checked every 5 years, or more frequently if you are over 42 years old. Skin check. Lung cancer screening. You may have this screening every year starting at age 30 if you have a 30-pack-year history of smoking and currently smoke or have quit within the past 15 years. Fecal occult blood test (FOBT) of the stool. You may have this test every year starting at age 60. Flexible sigmoidoscopy or colonoscopy. You may have a sigmoidoscopy every 5 years or a colonoscopy every 10 years starting at age 72. Hepatitis C blood test. Hepatitis B blood test. Sexually transmitted disease (STD) testing.  Diabetes screening. This is done by checking your blood sugar (glucose) after you have not eaten for a while (fasting). You may have this done every 1-3 years. Bone density scan. This is done to screen for osteoporosis. You may have this done starting at age 59. Mammogram. This may be done every 1-2 years. Talk to your health care provider about how often you should have regular mammograms. Talk with your health care provider about your test results,  treatment options, and if necessary, the need for more tests. Vaccines  Your health care provider may recommend certain vaccines, such as: Influenza vaccine. This is recommended every year. Tetanus, diphtheria, and acellular pertussis (Tdap, Td) vaccine. You may need a Td booster every 10 years. Zoster vaccine. You may need this after age 21. Pneumococcal 13-valent conjugate (PCV13) vaccine. One dose is recommended after age 10. Pneumococcal polysaccharide (PPSV23) vaccine. One dose is recommended after age 56. Talk to your health care provider about which screenings and vaccines you need and how often you need them. This information is not intended to replace advice given to you by your health care provider. Make sure you discuss any questions you have with your health care provider. Document Released: 09/06/2015 Document Revised: 04/29/2016 Document Reviewed: 06/11/2015 Elsevier Interactive Patient Education  2017 [place] Prevention in the Home Falls can cause injuries. They can happen to people of all ages. There are many things you can do to make your home safe and to help prevent falls. What can I do on the outside of my home? Regularly fix the edges of walkways and driveways and fix any cracks. Remove anything that might make you trip as you walk through a door, such as a raised step or threshold. Trim any bushes or trees on the path to your home. Use bright outdoor lighting. Clear any walking paths of anything that might make someone trip, such as rocks or tools. Regularly check to see if handrails are loose or broken. Make sure that both sides of any steps have handrails. Any raised decks and porches should have guardrails on the edges. Have any leaves, snow, or ice cleared regularly. Use sand or salt on walking paths during winter. Clean up any spills in your garage right away. This includes oil or grease spills. What can I do in the bathroom? Use night  lights. Install grab bars by the toilet and in the tub and shower. Do not use towel bars as grab bars. Use non-skid mats or decals in the tub or shower. If you need to sit down in the shower, use a plastic, non-slip stool. Keep the floor dry. Clean up any water that spills on the floor as soon as it happens. Remove soap buildup in the tub or shower regularly. Attach bath mats securely with double-sided non-slip rug tape. Do not have throw rugs and other things on the floor that can make you trip. What can I do in the bedroom? Use night lights. Make sure that you have a light by your bed that is easy to reach. Do not use any sheets or blankets that are too big for your bed. They should not hang down onto the floor. Have a firm chair that has side arms. You can use this for support while you get dressed. Do not have throw rugs and other things on the floor that can make you trip. What can I do in the kitchen? Clean up any spills right away. Avoid walking on wet floors.  Keep items that you use a lot in easy-to-reach places. If you need to reach something above you, use a strong step stool that has a grab bar. Keep electrical cords out of the way. Do not use floor polish or wax that makes floors slippery. If you must use wax, use non-skid floor wax. Do not have throw rugs and other things on the floor that can make you trip. What can I do with my stairs? Do not leave any items on the stairs. Make sure that there are handrails on both sides of the stairs and use them. Fix handrails that are broken or loose. Make sure that handrails are as long as the stairways. Check any carpeting to make sure that it is firmly attached to the stairs. Fix any carpet that is loose or worn. Avoid having throw rugs at the top or bottom of the stairs. If you do have throw rugs, attach them to the floor with carpet tape. Make sure that you have a light switch at the top of the stairs and the bottom of the stairs. If  you do not have them, ask someone to add them for you. What else can I do to help prevent falls? Wear shoes that: Do not have high heels. Have rubber bottoms. Are comfortable and fit you well. Are closed at the toe. Do not wear sandals. If you use a stepladder: Make sure that it is fully opened. Do not climb a closed stepladder. Make sure that both sides of the stepladder are locked into place. Ask someone to hold it for you, if possible. Clearly mark and make sure that you can see: Any grab bars or handrails. First and last steps. Where the edge of each step is. Use tools that help you move around (mobility aids) if they are needed. These include: Canes. Walkers. Scooters. Crutches. Turn on the lights when you go into a dark area. Replace any light bulbs as soon as they burn out. Set up your furniture so you have a clear path. Avoid moving your furniture around. If any of your floors are uneven, fix them. If there are any pets around you, be aware of where they are. Review your medicines with your doctor. Some medicines can make you feel dizzy. This can increase your chance of falling. Ask your doctor what other things that you can do to help prevent falls. This information is not intended to replace advice given to you by your health care provider. Make sure you discuss any questions you have with your health care provider. Document Released: 06/06/2009 Document Revised: 01/16/2016 Document Reviewed: 09/14/2014 Elsevier Interactive Patient Education  2017 [employer].

## 2022-12-02 ENCOUNTER — Telehealth: Payer: Self-pay | Admitting: Neurosurgery

## 2022-12-02 NOTE — Telephone Encounter (Signed)
Last rx was sent for 600mg  three times a day, message this morning states "300mg  x2 a day". Left message for pt to return call to confirm dosage before we send rx to [facility].

## 2022-12-02 NOTE — Telephone Encounter (Signed)
Patient is needing a refill on Gabapentin 300mg  x2 aday. At her last refill she was told that any further refills would need to come from her PCP. Her PCP Dr.Jones does not feel comfortable prescribing over 100mg  of gabapentin so the patient was told to ask Dr.Yarbrough again. [facility]

## 2022-12-03 MED ORDER — GABAPENTIN 300 MG PO CAPS: 600.0000 mg | ORAL_CAPSULE | Freq: Three times a day (TID) | ORAL | 3 refills | Status: AC

## 2022-12-03 NOTE — Telephone Encounter (Signed)
Pt Called, stated that she is taking two pills 3x a day

## 2022-12-03 NOTE — Telephone Encounter (Signed)
Please let her know I sent in 90 day supply with refills to get her through 1 year. Thanks

## 2022-12-09 NOTE — Telephone Encounter (Signed)
Patient was notified of medication refill for 1 year.

## 2022-12-24 DIAGNOSIS — Z7901 Long term (current) use of anticoagulants: Secondary | ICD-10-CM | POA: Diagnosis not present

## 2022-12-24 DIAGNOSIS — I442 Atrioventricular block, complete: Secondary | ICD-10-CM | POA: Diagnosis not present

## 2022-12-24 DIAGNOSIS — Z45018 Encounter for adjustment and management of other part of cardiac pacemaker: Secondary | ICD-10-CM | POA: Diagnosis not present

## 2022-12-24 DIAGNOSIS — I480 Paroxysmal atrial fibrillation: Secondary | ICD-10-CM | POA: Diagnosis not present

## 2023-01-11 ENCOUNTER — Other Ambulatory Visit: Payer: Self-pay

## 2023-01-11 DIAGNOSIS — E039 Hypothyroidism, unspecified: Secondary | ICD-10-CM | POA: Diagnosis not present

## 2023-01-12 ENCOUNTER — Other Ambulatory Visit: Payer: Self-pay

## 2023-01-12 LAB — THYROID PANEL WITH TSH
Free Thyroxine Index: 1.3 (ref 1.2–4.9)
T3 Uptake Ratio: 23 % — ABNORMAL LOW (ref 24–39)
T4, Total: 5.8 ug/dL (ref 4.5–12.0)
TSH: 6.98 u[IU]/mL — ABNORMAL HIGH (ref 0.450–4.500)

## 2023-02-19 DIAGNOSIS — I498 Other specified cardiac arrhythmias: Secondary | ICD-10-CM | POA: Diagnosis not present

## 2023-02-19 DIAGNOSIS — I10 Essential (primary) hypertension: Secondary | ICD-10-CM | POA: Diagnosis not present

## 2023-02-19 DIAGNOSIS — J432 Centrilobular emphysema: Secondary | ICD-10-CM | POA: Diagnosis not present

## 2023-02-19 DIAGNOSIS — Z950 Presence of cardiac pacemaker: Secondary | ICD-10-CM | POA: Diagnosis not present

## 2023-02-19 DIAGNOSIS — I2510 Atherosclerotic heart disease of native coronary artery without angina pectoris: Secondary | ICD-10-CM | POA: Diagnosis not present

## 2023-02-19 DIAGNOSIS — J449 Chronic obstructive pulmonary disease, unspecified: Secondary | ICD-10-CM | POA: Diagnosis not present

## 2023-02-19 DIAGNOSIS — Z45018 Encounter for adjustment and management of other part of cardiac pacemaker: Secondary | ICD-10-CM | POA: Diagnosis not present

## 2023-02-19 DIAGNOSIS — I442 Atrioventricular block, complete: Secondary | ICD-10-CM | POA: Diagnosis not present

## 2023-02-19 DIAGNOSIS — Z7901 Long term (current) use of anticoagulants: Secondary | ICD-10-CM | POA: Diagnosis not present

## 2023-02-19 DIAGNOSIS — I480 Paroxysmal atrial fibrillation: Secondary | ICD-10-CM | POA: Diagnosis not present

## 2023-04-02 ENCOUNTER — Encounter: Payer: Self-pay | Admitting: Family Medicine

## 2023-04-02 ENCOUNTER — Ambulatory Visit: Payer: Medicare Other | Admitting: Family Medicine

## 2023-04-02 VITALS — BP 130/76 | HR 88 | Temp 99.2°F | Ht 65.0 in | Wt 182.0 lb

## 2023-04-02 DIAGNOSIS — U071 COVID-19: Secondary | ICD-10-CM | POA: Diagnosis not present

## 2023-04-02 MED ORDER — MOLNUPIRAVIR EUA 200MG CAPSULE
4.0000 | ORAL_CAPSULE | Freq: Two times a day (BID) | ORAL | 0 refills | Status: AC
Start: 2023-04-02 — End: 2023-04-07

## 2023-04-02 NOTE — Progress Notes (Signed)
Date:  04/02/2023   Name:  Danielle Taylor   DOB:  1945/09/08   MRN:  [id number]   Chief Complaint: Covid Positive (Symptoms started Tuesday with cough, aches- got worse yesterday. Chills and fever, fatigue)  Fever  This is a chronic problem. The current episode started more than 1 year ago. The problem occurs intermittently. The problem has been waxing and waning. The maximum temperature noted was 99 to 99.9 F. Associated symptoms include congestion, coughing, headaches, muscle aches, nausea and a sore throat. Pertinent negatives include no chest pain, diarrhea, ear pain, vomiting or wheezing. The treatment provided mild relief.    Lab Results  Component Value Date   NA 143 10/28/2022   K 4.9 10/28/2022   CO2 21 10/28/2022   GLUCOSE 102 (H) 10/28/2022   BUN 19 10/28/2022   CREATININE 0.76 10/28/2022   CALCIUM 9.5 10/28/2022   EGFR 82 10/28/2022   GFRNONAA >60 08/31/2022   Lab Results  Component Value Date   CHOL 152 10/28/2022   HDL 50 10/28/2022   LDLCALC 86 10/28/2022   TRIG 87 10/28/2022   CHOLHDL 3.5 02/08/2017   Lab Results  Component Value Date   TSH 6.980 (H) 01/11/2023   Lab Results  Component Value Date   HGBA1C 6.3 (H) 03/11/2016   Lab Results  Component Value Date   WBC 11.4 (H) 08/31/2022   HGB 12.0 08/31/2022   HCT 38.2 08/31/2022   MCV 94.6 08/31/2022   PLT 486 (H) 08/31/2022   Lab Results  Component Value Date   ALT 19 10/28/2022   AST 22 10/28/2022   ALKPHOS 129 (H) 10/28/2022   BILITOT 0.6 10/28/2022   No results found for: "25OHVITD2", "25OHVITD3", "VD25OH"   Review of Systems  Constitutional:  Positive for chills and fever.  HENT:  Positive for congestion, postnasal drip, rhinorrhea, sinus pressure and sore throat. Negative for ear pain, nosebleeds and sinus pain.   Respiratory:  Positive for cough. Negative for chest tightness, shortness of breath and wheezing.   Cardiovascular:  Negative for chest pain.  Gastrointestinal:   Positive for nausea. Negative for diarrhea and vomiting.  Neurological:  Positive for headaches.    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   Lumbar radiculopathy 06/29/2018   Chronic low back pain with sciatica 02/15/2018   Reactive airway disease, mild intermittent, uncomplicated 08/10/2017   Chronic seasonal allergic rhinitis due to pollen 08/10/2017   Mixed hyperlipidemia 08/10/2017   Depression 08/10/2017   Class 1 obesity due to excess calories without serious comorbidity with body mass index (BMI) of 34.0 to 34.9 in adult 08/10/2017   Chronic cough 06/15/2017   Interstitial lung disease (HCC) 06/15/2017   Centrilobular emphysema (HCC) 04/02/2017   Lumbar disc disease 04/02/2017   Hepatic steatosis 04/02/2017   Atherosclerosis of coronary artery of native heart 04/02/2017   Cardiac pacemaker in situ 01/06/2017   Paroxysmal A-fib (HCC) 03/30/2016   Syncope and collapse 03/11/2016   Right leg weakness 03/11/2016   Essential hypertension 03/11/2016   Hyperglycemia 03/11/2016   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     Penicillins Other (See Comments)    Has patient had a PCN reaction causing immediate rash, facial/tongue/throat swelling, SOB or lightheadedness with hypotension: No Has patient had a PCN reaction causing severe rash involving mucus membranes or skin necrosis: No Has patient had a PCN reaction that required hospitalization No Has patient had a PCN reaction occurring within the last 10 years: No If all of the above answers are "NO", then may proceed with Cephalosporin use.     Past Surgical History:  Procedure Laterality Date   APPENDECTOMY     COLONOSCOPY  08/24/1998   COLONOSCOPY WITH PROPOFOL N/A 04/26/2015   Procedure: COLONOSCOPY WITH PROPOFOL;  Surgeon: Wallace Cullens, MD;  Location: [facility] ENDOSCOPY;  Service: Gastroenterology;  Laterality: N/A;   COLONOSCOPY WITH PROPOFOL N/A 07/14/2021   Procedure: COLONOSCOPY WITH PROPOFOL;  Surgeon: Jaynie Collins, DO;  Location: [facility] ENDOSCOPY;  Service: Gastroenterology;  Laterality: N/A;   EYE SURGERY     fibroid tumors     fingers and wrist   FOOT SURGERY     TONSILLECTOMY     TRANSFORAMINAL LUMBAR INTERBODY FUSION (TLIF) WITH PEDICLE SCREW FIXATION 1 LEVEL N/A 06/29/2018   Procedure: TRANSFORAMINAL LUMBAR INTERBODY FUSION (TLIF) WITH PEDICLE SCREW FIXATION 1 LEVEL;  Surgeon: Venetia Night, MD;  Location: [facility] ORS;  Service: Neurosurgery;  Laterality: N/A;   VAGINAL HYSTERECTOMY      Social History   Tobacco Use   Smoking status: Former    Current packs/day: 0.00    Average packs/day: 0.5 packs/day for 33.0 years (16.5 ttl pk-yrs)    Types: Cigarettes    Start date: 86    Quit date: 2003    Years since quitting: 21.6   Smokeless tobacco: Never   Tobacco comments:    smoking cessation materials not required  Vaping Use   Vaping status: Never Used  Substance Use Topics   Alcohol use:  Yes    Alcohol/week: 1.0 standard drink of alcohol    Types: 1 Glasses of wine per week    Comment: occassional   Drug use: Never     Medication list has been reviewed and updated.  Current Meds  Medication Sig   albuterol (PROVENTIL) (2.5 MG/3ML) 0.083% nebulizer solution Take 3 mLs (2.5 mg total) by nebulization every 6 (six) hours as needed for wheezing or shortness of breath.   citalopram (CELEXA) 40 MG tablet Take 1 tablet (40 mg total) by mouth daily.   Fluticasone-Umeclidin-Vilant (TRELEGY ELLIPTA) 100-62.5-25 MCG/ACT AEPB Inhale 1 Inhaler into the lungs daily.   gabapentin (NEURONTIN) 300 MG capsule Take 2 capsules (600 mg total) by mouth 3 (three) times daily.   gemfibrozil (LOPID) 600 MG tablet Take 1 tablet (600 mg total) by mouth daily.   levothyroxine (SYNTHROID) 112 MCG tablet Take 1 tablet (112 mcg total) by mouth daily.   loratadine (CLARITIN) 10 MG tablet Take 1 tablet (10 mg total) by mouth daily.   metoprolol succinate (TOPROL-XL) 50 MG 24 hr tablet Take 1 tablet (50 mg total) by  mouth daily. TAKE WITH OR IMMEDIATELY FOLLOWING A MEAL.   montelukast (SINGULAIR) 10 MG tablet TAKE 1 TABLET BY MOUTH EVERY DAY   Multiple Vitamins-Minerals (CENTRUM SILVER 50+WOMEN PO) Take 1 tablet by mouth daily.   nystatin cream (MYCOSTATIN) APPLY TO AFFECTED AREA TWICE A DAY   omeprazole (PRILOSEC) 20 MG capsule Take 20 mg by mouth daily.   pantoprazole (PROTONIX) 40 MG tablet Take 1 tablet (40 mg total) by mouth daily.   rivaroxaban (XARELTO) 20 MG TABS tablet Take 20 mg by mouth daily with supper. Cardiologist [facility]   simvastatin (ZOCOR) 20 MG tablet Take 1 tablet (20 mg total) by mouth daily.   valsartan (DIOVAN) 160 MG tablet Take 1 tablet (160 mg total) by mouth daily.   Current Facility-Administered Medications for the 04/02/23 encounter (Office Visit) with Duanne Limerick, MD  Medication   albuterol (PROVENTIL) (2.5 MG/3ML) 0.083% nebulizer solution 2.5 mg   ipratropium-albuterol  (DUONEB) 0.5-2.5 (3) MG/3ML nebulizer solution 3 mL       11/25/2022   10:23 AM 10/28/2022   10:12 AM 09/10/2022    3:21 PM 07/28/2022    9:23 AM  GAD 7 : Generalized Anxiety Score  Nervous, Anxious, on Edge 0 0 0 0  Control/stop worrying 0 0 0 0  Worry too much - different things 0 0 0 0  Trouble relaxing 0 0 0 0  Restless 0 0 0 0  Easily annoyed or irritable 0 0 0 0  Afraid - awful might happen 0 0 0 0  Total GAD 7 Score 0 0 0 0  Anxiety Difficulty Not difficult at all Not difficult at all Not difficult at all Not difficult at all       11/26/2022    9:03 AM 11/25/2022   10:23 AM 10/28/2022   10:12 AM  Depression screen PHQ 2/9  Decreased Interest 0 0 0  Down, Depressed, Hopeless 0 0 0  PHQ - 2 Score 0 0 0  Altered sleeping 0 0 0  Tired, decreased energy 0 0 0  Change in appetite 0 0 0  Feeling bad or failure about yourself  0 0 0  Trouble concentrating 0 0 0  Moving slowly or fidgety/restless 0 0 0  Suicidal thoughts 0 0 0  PHQ-9 Score 0 0 0  Difficult doing work/chores Not difficult at all Not difficult at all Not difficult at all    BP Readings from Last 3 Encounters:  04/02/23 130/76  11/25/22 120/72  10/28/22 (!) 160/108    Physical Exam Vitals and nursing note reviewed. Exam conducted with a chaperone present.  Constitutional:      General: She is not in acute distress.    Appearance: She is not diaphoretic.  HENT:     Head: Normocephalic and atraumatic.     Right Ear: Tympanic membrane and external ear normal.     Left Ear: Tympanic membrane and external ear normal.     Nose: Nose normal. No congestion or rhinorrhea.     Mouth/Throat:     Mouth: Mucous membranes are moist.  Eyes:     General:        Right eye: No discharge.        Left eye: No discharge.     Conjunctiva/sclera: Conjunctivae normal.     Pupils: Pupils are equal, round, and reactive to light.  Neck:     Thyroid: No thyromegaly.     Vascular: No JVD.  Cardiovascular:     Rate  and Rhythm:  Normal rate and regular rhythm.     Heart sounds: Normal heart sounds. No murmur heard.    No friction rub. No gallop.  Pulmonary:     Effort: Pulmonary effort is normal.     Breath sounds: Normal breath sounds. No wheezing, rhonchi or rales.  Chest:     Chest wall: No tenderness.  Abdominal:     General: Bowel sounds are normal.     Palpations: Abdomen is soft. There is no mass.     Tenderness: There is no abdominal tenderness. There is no left CVA tenderness, guarding or rebound.  Musculoskeletal:        General: Normal range of motion.     Cervical back: Normal range of motion and neck supple.  Lymphadenopathy:     Cervical: No cervical adenopathy.  Skin:    General: Skin is warm and dry.     Capillary Refill: Capillary refill takes less than 2 seconds.  Neurological:     Mental Status: She is alert.     Deep Tendon Reflexes: Reflexes are normal and symmetric.     Wt Readings from Last 3 Encounters:  04/02/23 182 lb (82.6 kg)  11/26/22 184 lb (83.5 kg)  11/25/22 184 lb (83.5 kg)    BP 130/76   Pulse 88   Temp 99.2 F (37.3 C) (Oral)   Ht 5\' 5"  (1.651 m)   Wt 182 lb (82.6 kg)   BMI 30.29 kg/m   Assessment and Plan:  1. COVID New onset.  Persistent.  Although not worsening patient has chills fever fatigue myalgias and upper respiratory congestion which she was symptomatically controlled but we will proceed given the multitude of medications she is on that would cross-react with Paxlovid initiate molnupiravir 200 mg 800 mg (4 capsules) twice a day for 5 days.  Patient has been instructed if symptoms should worsen that she may need to seek further medical attention at an emergency room setting. - molnupiravir EUA (LAGEVRIO) 200 mg CAPS capsule; Take 4 capsules (800 mg total) by mouth 2 (two) times daily for 5 days.  Dispense: 40 capsule; Refill: 0    Elizabeth Sauer, MD

## 2023-04-02 NOTE — Patient Instructions (Signed)

## 2023-04-08 DIAGNOSIS — U071 COVID-19: Secondary | ICD-10-CM

## 2023-04-08 HISTORY — DX: COVID-19: U07.1

## 2023-04-20 DIAGNOSIS — R0602 Shortness of breath: Secondary | ICD-10-CM | POA: Diagnosis not present

## 2023-04-20 DIAGNOSIS — R053 Chronic cough: Secondary | ICD-10-CM | POA: Diagnosis not present

## 2023-04-20 DIAGNOSIS — R059 Cough, unspecified: Secondary | ICD-10-CM | POA: Diagnosis not present

## 2023-04-20 DIAGNOSIS — U071 COVID-19: Secondary | ICD-10-CM | POA: Diagnosis not present

## 2023-04-24 ENCOUNTER — Other Ambulatory Visit: Payer: Self-pay | Admitting: Family Medicine

## 2023-04-24 DIAGNOSIS — F32A Depression, unspecified: Secondary | ICD-10-CM

## 2023-04-24 DIAGNOSIS — E7849 Other hyperlipidemia: Secondary | ICD-10-CM

## 2023-04-24 DIAGNOSIS — E782 Mixed hyperlipidemia: Secondary | ICD-10-CM

## 2023-04-24 DIAGNOSIS — J189 Pneumonia, unspecified organism: Secondary | ICD-10-CM

## 2023-04-24 HISTORY — DX: Pneumonia, unspecified organism: J18.9

## 2023-04-25 ENCOUNTER — Inpatient Hospital Stay
Admission: EM | Admit: 2023-04-25 | Discharge: 2023-04-30 | DRG: 871 | Disposition: A | Discharge status: Home Health | Payer: Medicare Other | Attending: Osteopathic Medicine | Admitting: Osteopathic Medicine

## 2023-04-25 ENCOUNTER — Other Ambulatory Visit: Payer: Self-pay

## 2023-04-25 ENCOUNTER — Emergency Department: Payer: Medicare Other

## 2023-04-25 DIAGNOSIS — E6609 Other obesity due to excess calories: Secondary | ICD-10-CM | POA: Diagnosis present

## 2023-04-25 DIAGNOSIS — J4489 Other specified chronic obstructive pulmonary disease: Secondary | ICD-10-CM | POA: Diagnosis not present

## 2023-04-25 DIAGNOSIS — G030 Nonpyogenic meningitis: Secondary | ICD-10-CM | POA: Diagnosis present

## 2023-04-25 DIAGNOSIS — Z880 Allergy status to penicillin: Secondary | ICD-10-CM

## 2023-04-25 DIAGNOSIS — J432 Centrilobular emphysema: Secondary | ICD-10-CM | POA: Diagnosis present

## 2023-04-25 DIAGNOSIS — A4189 Other specified sepsis: Secondary | ICD-10-CM | POA: Diagnosis present

## 2023-04-25 DIAGNOSIS — E7849 Other hyperlipidemia: Secondary | ICD-10-CM | POA: Diagnosis not present

## 2023-04-25 DIAGNOSIS — Z8673 Personal history of transient ischemic attack (TIA), and cerebral infarction without residual deficits: Secondary | ICD-10-CM

## 2023-04-25 DIAGNOSIS — Z8616 Personal history of COVID-19: Secondary | ICD-10-CM

## 2023-04-25 DIAGNOSIS — M5416 Radiculopathy, lumbar region: Secondary | ICD-10-CM | POA: Diagnosis not present

## 2023-04-25 DIAGNOSIS — R410 Disorientation, unspecified: Secondary | ICD-10-CM

## 2023-04-25 DIAGNOSIS — E872 Acidosis, unspecified: Secondary | ICD-10-CM | POA: Diagnosis not present

## 2023-04-25 DIAGNOSIS — Z9049 Acquired absence of other specified parts of digestive tract: Secondary | ICD-10-CM

## 2023-04-25 DIAGNOSIS — Z950 Presence of cardiac pacemaker: Secondary | ICD-10-CM | POA: Diagnosis not present

## 2023-04-25 DIAGNOSIS — E860 Dehydration: Secondary | ICD-10-CM | POA: Diagnosis present

## 2023-04-25 DIAGNOSIS — E876 Hypokalemia: Secondary | ICD-10-CM | POA: Diagnosis not present

## 2023-04-25 DIAGNOSIS — G8929 Other chronic pain: Secondary | ICD-10-CM | POA: Diagnosis not present

## 2023-04-25 DIAGNOSIS — I10 Essential (primary) hypertension: Secondary | ICD-10-CM | POA: Diagnosis not present

## 2023-04-25 DIAGNOSIS — Z882 Allergy status to sulfonamides status: Secondary | ICD-10-CM

## 2023-04-25 DIAGNOSIS — G9341 Metabolic encephalopathy: Secondary | ICD-10-CM | POA: Diagnosis not present

## 2023-04-25 DIAGNOSIS — Z792 Long term (current) use of antibiotics: Secondary | ICD-10-CM

## 2023-04-25 DIAGNOSIS — A419 Sepsis, unspecified organism: Principal | ICD-10-CM | POA: Diagnosis present

## 2023-04-25 DIAGNOSIS — R32 Unspecified urinary incontinence: Secondary | ICD-10-CM | POA: Diagnosis not present

## 2023-04-25 DIAGNOSIS — I2510 Atherosclerotic heart disease of native coronary artery without angina pectoris: Secondary | ICD-10-CM | POA: Diagnosis present

## 2023-04-25 DIAGNOSIS — Z8249 Family history of ischemic heart disease and other diseases of the circulatory system: Secondary | ICD-10-CM

## 2023-04-25 DIAGNOSIS — I700 Atherosclerosis of aorta: Secondary | ICD-10-CM | POA: Diagnosis not present

## 2023-04-25 DIAGNOSIS — Z79890 Hormone replacement therapy (postmenopausal): Secondary | ICD-10-CM

## 2023-04-25 DIAGNOSIS — K219 Gastro-esophageal reflux disease without esophagitis: Secondary | ICD-10-CM | POA: Diagnosis present

## 2023-04-25 DIAGNOSIS — R6520 Severe sepsis without septic shock: Secondary | ICD-10-CM | POA: Diagnosis not present

## 2023-04-25 DIAGNOSIS — D649 Anemia, unspecified: Secondary | ICD-10-CM | POA: Diagnosis present

## 2023-04-25 DIAGNOSIS — E890 Postprocedural hypothyroidism: Secondary | ICD-10-CM | POA: Diagnosis present

## 2023-04-25 DIAGNOSIS — I480 Paroxysmal atrial fibrillation: Secondary | ICD-10-CM | POA: Diagnosis present

## 2023-04-25 DIAGNOSIS — Z79899 Other long term (current) drug therapy: Secondary | ICD-10-CM

## 2023-04-25 DIAGNOSIS — U071 COVID-19: Secondary | ICD-10-CM | POA: Diagnosis present

## 2023-04-25 DIAGNOSIS — Z7952 Long term (current) use of systemic steroids: Secondary | ICD-10-CM

## 2023-04-25 DIAGNOSIS — E782 Mixed hyperlipidemia: Secondary | ICD-10-CM | POA: Diagnosis present

## 2023-04-25 DIAGNOSIS — R5381 Other malaise: Secondary | ICD-10-CM | POA: Diagnosis not present

## 2023-04-25 DIAGNOSIS — F32A Depression, unspecified: Secondary | ICD-10-CM | POA: Diagnosis not present

## 2023-04-25 DIAGNOSIS — J849 Interstitial pulmonary disease, unspecified: Secondary | ICD-10-CM | POA: Diagnosis present

## 2023-04-25 DIAGNOSIS — R509 Fever, unspecified: Secondary | ICD-10-CM | POA: Diagnosis not present

## 2023-04-25 DIAGNOSIS — R4182 Altered mental status, unspecified: Principal | ICD-10-CM | POA: Diagnosis present

## 2023-04-25 DIAGNOSIS — I517 Cardiomegaly: Secondary | ICD-10-CM | POA: Diagnosis not present

## 2023-04-25 DIAGNOSIS — J4520 Mild intermittent asthma, uncomplicated: Secondary | ICD-10-CM | POA: Diagnosis present

## 2023-04-25 DIAGNOSIS — Z87891 Personal history of nicotine dependence: Secondary | ICD-10-CM

## 2023-04-25 DIAGNOSIS — D72829 Elevated white blood cell count, unspecified: Secondary | ICD-10-CM | POA: Diagnosis not present

## 2023-04-25 DIAGNOSIS — E039 Hypothyroidism, unspecified: Secondary | ICD-10-CM | POA: Diagnosis present

## 2023-04-25 DIAGNOSIS — J449 Chronic obstructive pulmonary disease, unspecified: Secondary | ICD-10-CM | POA: Diagnosis present

## 2023-04-25 DIAGNOSIS — Z7901 Long term (current) use of anticoagulants: Secondary | ICD-10-CM

## 2023-04-25 DIAGNOSIS — J45909 Unspecified asthma, uncomplicated: Secondary | ICD-10-CM | POA: Diagnosis present

## 2023-04-25 DIAGNOSIS — Z6832 Body mass index (BMI) 32.0-32.9, adult: Secondary | ICD-10-CM | POA: Diagnosis not present

## 2023-04-25 DIAGNOSIS — Z90710 Acquired absence of both cervix and uterus: Secondary | ICD-10-CM | POA: Diagnosis not present

## 2023-04-25 LAB — CBC WITH DIFFERENTIAL/PLATELET
Abs Immature Granulocytes: 0.06 10*3/uL (ref 0.00–0.07)
Basophils Absolute: 0 10*3/uL (ref 0.0–0.1)
Basophils Relative: 0 %
Eosinophils Absolute: 0 10*3/uL (ref 0.0–0.5)
Eosinophils Relative: 0 %
HCT: 19.1 % — ABNORMAL LOW (ref 36.0–46.0)
Hemoglobin: 5.7 g/dL — ABNORMAL LOW (ref 12.0–15.0)
Immature Granulocytes: 1 %
Lymphocytes Relative: 10 %
Lymphs Abs: 1.1 10*3/uL (ref 0.7–4.0)
MCH: 25 pg — ABNORMAL LOW (ref 26.0–34.0)
MCHC: 29.8 g/dL — ABNORMAL LOW (ref 30.0–36.0)
MCV: 83.8 fL (ref 80.0–100.0)
Monocytes Absolute: 0.8 10*3/uL (ref 0.1–1.0)
Monocytes Relative: 7 %
Neutro Abs: 9.2 10*3/uL — ABNORMAL HIGH (ref 1.7–7.7)
Neutrophils Relative %: 82 %
Platelets: 411 10*3/uL — ABNORMAL HIGH (ref 150–400)
RBC: 2.28 MIL/uL — ABNORMAL LOW (ref 3.87–5.11)
RDW: 17.9 % — ABNORMAL HIGH (ref 11.5–15.5)
WBC: 11.2 10*3/uL — ABNORMAL HIGH (ref 4.0–10.5)
nRBC: 1.2 % — ABNORMAL HIGH (ref 0.0–0.2)

## 2023-04-25 LAB — LACTIC ACID, PLASMA
Lactic Acid, Venous: 2.9 mmol/L (ref 0.5–1.9)
Lactic Acid, Venous: 2.3 mmol/L (ref 0.5–1.9)

## 2023-04-25 LAB — COMPREHENSIVE METABOLIC PANEL
ALT: 29 U/L (ref 0–44)
AST: 40 U/L (ref 15–41)
Albumin: 3.7 g/dL (ref 3.5–5.0)
Alkaline Phosphatase: 80 U/L (ref 38–126)
Anion gap: 12 (ref 5–15)
BUN: 23 mg/dL (ref 8–23)
CO2: 17 mmol/L — ABNORMAL LOW (ref 22–32)
Calcium: 8.8 mg/dL — ABNORMAL LOW (ref 8.9–10.3)
Chloride: 113 mmol/L — ABNORMAL HIGH (ref 98–111)
Creatinine, Ser: 0.97 mg/dL (ref 0.44–1.00)
GFR, Estimated: >60 mL/min (ref >60)
Glucose, Bld: 114 mg/dL — ABNORMAL HIGH (ref 70–99)
Potassium: 3.5 mmol/L (ref 3.5–5.1)
Sodium: 142 mmol/L (ref 135–145)
Total Bilirubin: 1 mg/dL (ref 0.3–1.2)
Total Protein: 6.7 g/dL (ref 6.5–8.1)

## 2023-04-25 LAB — URINALYSIS, W/ REFLEX TO CULTURE (INFECTION SUSPECTED)
Bacteria, UA: NONE SEEN
Bacteria, UA: NONE SEEN
Bilirubin Urine: NEGATIVE
Glucose, UA: NEGATIVE mg/dL
Glucose, UA: NEGATIVE mg/dL
Hgb urine dipstick: NEGATIVE
Hgb urine dipstick: NEGATIVE
Ketones, ur: 40 mg/dL — AB
Ketones, ur: 15 mg/dL — AB
Leukocytes,Ua: NEGATIVE
Leukocytes,Ua: NEGATIVE
Nitrite: NEGATIVE
Nitrite: NEGATIVE
Protein, ur: NEGATIVE mg/dL
Specific Gravity, Urine: 1.025 (ref 1.005–1.030)
Specific Gravity, Urine: 1.015 (ref 1.005–1.030)
pH: 5.5 (ref 5.0–8.0)
pH: 5.5 (ref 5.0–8.0)

## 2023-04-25 LAB — TSH: TSH: 5.79 u[IU]/mL — ABNORMAL HIGH (ref 0.350–4.500)

## 2023-04-25 LAB — RETICULOCYTES
Immature Retic Fract: 22 % — ABNORMAL HIGH (ref 2.3–15.9)
RBC.: 2.26 MIL/uL — ABNORMAL LOW (ref 3.87–5.11)
Retic Count, Absolute: 68.8 10*3/uL (ref 19.0–186.0)
Retic Ct Pct: 2.8 % (ref 0.4–3.1)

## 2023-04-25 LAB — HEMOGLOBIN AND HEMATOCRIT, BLOOD
HCT: 25.3 % — ABNORMAL LOW (ref 36.0–46.0)
Hemoglobin: 7.9 g/dL — ABNORMAL LOW (ref 12.0–15.0)

## 2023-04-25 LAB — PREPARE RBC (CROSSMATCH)

## 2023-04-25 LAB — SARS CORONAVIRUS 2 BY RT PCR: SARS Coronavirus 2 by RT PCR: POSITIVE — AB

## 2023-04-25 LAB — MRSA NEXT GEN BY PCR, NASAL: MRSA by PCR Next Gen: NOT DETECTED

## 2023-04-25 LAB — GLUCOSE, CAPILLARY: Glucose-Capillary: 111 mg/dL — ABNORMAL HIGH (ref 70–99)

## 2023-04-25 MED ORDER — PANTOPRAZOLE INFUSION (NEW) - SIMPLE MED
8.0000 mg/h | INTRAVENOUS | Status: DC
  Administered 2023-04-25 – 2023-04-27 (×4): 8 mg/h via INTRAVENOUS
  Filled 2023-04-25 (×4): qty 100

## 2023-04-25 MED ORDER — VANCOMYCIN HCL 2000 MG/400ML IV SOLN
2000.0000 mg | Freq: Once | INTRAVENOUS | Status: AC
  Administered 2023-04-25: 2000 mg via INTRAVENOUS
  Filled 2023-04-25: qty 400

## 2023-04-25 MED ORDER — PANTOPRAZOLE 80MG IVPB - SIMPLE MED
80.0000 mg | Freq: Once | INTRAVENOUS | Status: AC
  Administered 2023-04-25: 80 mg via INTRAVENOUS
  Filled 2023-04-25: qty 100

## 2023-04-25 MED ORDER — IPRATROPIUM-ALBUTEROL 20-100 MCG/ACT IN AERS
1.0000 | INHALATION_SPRAY | RESPIRATORY_TRACT | Status: DC
  Administered 2023-04-26 – 2023-04-30 (×23): 1 via RESPIRATORY_TRACT
  Filled 2023-04-25: qty 4

## 2023-04-25 MED ORDER — POLYETHYLENE GLYCOL 3350 17 G PO PACK: 17.0000 g | PACK | Freq: Every day | ORAL | Status: DC | PRN

## 2023-04-25 MED ORDER — METRONIDAZOLE 500 MG/100ML IV SOLN
500.0000 mg | Freq: Once | INTRAVENOUS | Status: DC
  Administered 2023-04-25: 500 mg via INTRAVENOUS
  Filled 2023-04-25: qty 100

## 2023-04-25 MED ORDER — DROPERIDOL 2.5 MG/ML IJ SOLN
2.5000 mg | Freq: Once | INTRAMUSCULAR | Status: AC
  Administered 2023-04-25: 2.5 mg via INTRAVENOUS

## 2023-04-25 MED ORDER — LORAZEPAM 2 MG/ML IJ SOLN: 2.0000 mg | Freq: Once | INTRAMUSCULAR | Status: DC | PRN

## 2023-04-25 MED ORDER — SODIUM CHLORIDE 0.9 % IV SOLN
1.0000 g | Freq: Once | INTRAVENOUS | Status: AC
  Administered 2023-04-25: 1 g via INTRAVENOUS
  Filled 2023-04-25: qty 10

## 2023-04-25 MED ORDER — SODIUM CHLORIDE 0.9 % IV SOLN
500.0000 mg | Freq: Once | INTRAVENOUS | Status: AC
  Administered 2023-04-25: 500 mg via INTRAVENOUS
  Filled 2023-04-25: qty 5

## 2023-04-25 MED ORDER — IOHEXOL 300 MG/ML  SOLN
100.0000 mL | Freq: Once | INTRAMUSCULAR | Status: AC | PRN
  Administered 2023-04-25: 100 mL via INTRAVENOUS

## 2023-04-25 MED ORDER — ORAL CARE MOUTH RINSE: 15.0000 mL | OROMUCOSAL | Status: DC | PRN

## 2023-04-25 MED ORDER — ENOXAPARIN SODIUM 60 MG/0.6ML IJ SOSY: 0.5000 mg/kg | PREFILLED_SYRINGE | INTRAMUSCULAR | Status: DC

## 2023-04-25 MED ORDER — ACETAMINOPHEN 325 MG PO TABS: 650.0000 mg | ORAL_TABLET | Freq: Once | ORAL | Status: DC

## 2023-04-25 MED ORDER — VANCOMYCIN HCL 1250 MG/250ML IV SOLN: 1250.0000 mg | INTRAVENOUS | Status: DC

## 2023-04-25 MED ORDER — LORAZEPAM 2 MG/ML IJ SOLN
2.0000 mg | Freq: Once | INTRAMUSCULAR | Status: AC
  Administered 2023-04-25: 2 mg via INTRAVENOUS
  Filled 2023-04-25: qty 1

## 2023-04-25 MED ORDER — SODIUM CHLORIDE 0.9 % IV SOLN: 1.0000 g | Freq: Three times a day (TID) | INTRAVENOUS | Status: DC

## 2023-04-25 MED ORDER — IPRATROPIUM-ALBUTEROL 0.5-2.5 (3) MG/3ML IN SOLN: 3.0000 mL | RESPIRATORY_TRACT | Status: DC

## 2023-04-25 MED ORDER — DEXTROSE 5 % IV SOLN: 50.0000 mg/kg/d | Freq: Four times a day (QID) | INTRAVENOUS | Status: DC

## 2023-04-25 MED ORDER — LEVOTHYROXINE SODIUM 100 MCG PO TABS: 100.0000 ug | ORAL_TABLET | Freq: Every day | ORAL | Status: DC

## 2023-04-25 MED ORDER — VANCOMYCIN HCL 1500 MG/300ML IV SOLN
1500.0000 mg | INTRAVENOUS | Status: DC
  Administered 2023-04-26: 1500 mg via INTRAVENOUS
  Filled 2023-04-25: qty 300

## 2023-04-25 MED ORDER — LACTATED RINGERS IV BOLUS
1500.0000 mL | Freq: Once | INTRAVENOUS | Status: AC
  Administered 2023-04-25: 1500 mL via INTRAVENOUS

## 2023-04-25 MED ORDER — DEXMEDETOMIDINE HCL IN NACL 400 MCG/100ML IV SOLN
0.0000 ug/kg/h | INTRAVENOUS | Status: DC
  Administered 2023-04-25: 1.1 ug/kg/h via INTRAVENOUS
  Administered 2023-04-25: 0.9 ug/kg/h via INTRAVENOUS
  Administered 2023-04-26 (×2): 1.1 ug/kg/h via INTRAVENOUS
  Administered 2023-04-26: 0.7 ug/kg/h via INTRAVENOUS
  Filled 2023-04-25 (×4): qty 100

## 2023-04-25 MED ORDER — ACETAMINOPHEN 325 MG RE SUPP
650.0000 mg | Freq: Once | RECTAL | Status: AC
  Administered 2023-04-25: 650 mg via RECTAL
  Filled 2023-04-25: qty 2

## 2023-04-25 MED ORDER — SODIUM CHLORIDE 0.9 % IV SOLN
2.0000 g | Freq: Two times a day (BID) | INTRAVENOUS | Status: DC
  Administered 2023-04-25: 2 g via INTRAVENOUS
  Filled 2023-04-25 (×2): qty 12.5

## 2023-04-25 MED ORDER — CHLORHEXIDINE GLUCONATE CLOTH 2 % EX PADS
6.0000 | MEDICATED_PAD | Freq: Every day | CUTANEOUS | Status: DC
  Administered 2023-04-27 – 2023-04-29 (×3): 6 via TOPICAL

## 2023-04-25 MED ORDER — LORAZEPAM 2 MG/ML IJ SOLN
1.0000 mg | Freq: Once | INTRAMUSCULAR | Status: AC
  Administered 2023-04-25: 1 mg via INTRAMUSCULAR
  Filled 2023-04-25: qty 1

## 2023-04-25 MED ORDER — DEXMEDETOMIDINE HCL IN NACL 400 MCG/100ML IV SOLN
0.0000 ug/kg/h | INTRAVENOUS | Status: DC
  Administered 2023-04-25: 0.4 ug/kg/h via INTRAVENOUS
  Filled 2023-04-25: qty 100

## 2023-04-25 MED ORDER — DROPERIDOL 2.5 MG/ML IJ SOLN
2.5000 mg | Freq: Once | INTRAMUSCULAR | Status: AC
  Administered 2023-04-25: 2.5 mg via INTRAVENOUS
  Filled 2023-04-25: qty 2

## 2023-04-25 MED ORDER — DROPERIDOL 2.5 MG/ML IJ SOLN
2.5000 mg | Freq: Once | INTRAMUSCULAR | Status: DC
  Filled 2023-04-25: qty 2

## 2023-04-25 MED ORDER — DOCUSATE SODIUM 100 MG PO CAPS: 100.0000 mg | ORAL_CAPSULE | Freq: Two times a day (BID) | ORAL | Status: DC | PRN

## 2023-04-25 MED ORDER — DEXMEDETOMIDINE HCL IN NACL 400 MCG/100ML IV SOLN
INTRAVENOUS | Status: AC
  Filled 2023-04-25: qty 100

## 2023-04-25 MED ORDER — SODIUM CHLORIDE 0.9 % IV SOLN
10.0000 mL/h | Freq: Once | INTRAVENOUS | Status: AC
  Administered 2023-04-25: 10 mL/h via INTRAVENOUS

## 2023-04-25 MED ORDER — PANTOPRAZOLE SODIUM 40 MG IV SOLR: 40.0000 mg | Freq: Two times a day (BID) | INTRAVENOUS | Status: DC

## 2023-04-25 MED ORDER — DEXAMETHASONE SODIUM PHOSPHATE 10 MG/ML IJ SOLN
10.0000 mg | Freq: Four times a day (QID) | INTRAMUSCULAR | Status: DC
  Administered 2023-04-25 – 2023-04-28 (×11): 10 mg via INTRAVENOUS
  Filled 2023-04-25 (×13): qty 1

## 2023-04-25 NOTE — ED Triage Notes (Signed)
Pt to ED [facility] from home Pt had covid 2 weeks ago, had cxr yesterday, has PNA confusion since last night cbg 131 500cc fluids, 16g R AC IV placed by medic 100% RA, HR 90  A&O to self only Restless, will not stay still, thrashing in bed  Tachypneic, lungs sound clear   EDP at bedside

## 2023-04-25 NOTE — ED Notes (Signed)
LR bolus of still infusing. Pt somewhat sedated now, sleeping intermittently and still waking and moving, thrashing in bed. Son and husband at bedside. Have not been able to pick up RBC for transfusion yet. This RN until now has been one to one with this pt, attempting IV access, and finally facilitating CT scan .

## 2023-04-25 NOTE — ED Notes (Signed)
Bringing pt tp CT.

## 2023-04-25 NOTE — Consult Note (Addendum)
Pharmacy Antibiotic Note  Danielle Taylor is a 77 y.o. female admitted on 04/25/2023 with sepsis. PMH significant for familial HLD, HTN, depression, obesity, COPD, AF. Patient has listed allergy to penicillins but has tolerated ceftriaxone. Infectious workup ongoing. Pharmacy has been consulted for vancomycin, cefepime dosing.  Plan: Day 1 of antibiotics Give vancomycin 2000 mg IV x1 followed by 1500 mg IV Q24H. Goal AUC 400-550. Expected AUC: 522.8 Expected Css min: 13.1 SCr used: 0.97  Weight used: IBW, Vd used: 0.72 (BMI 32.2) Start cefepime 2 g Q12H Continue to monitor renal function and follow culture results   Height: 5\' 6"  (167.6 cm) Weight: 90.7 kg (200 lb) IBW/kg (Calculated) : 59.3  Temp (24hrs), Avg:98.6 F (37 C), Min:97 F (36.1 C), Max:100.1 F (37.8 C)  Recent Labs  Lab 04/25/23 1000 04/25/23 1200  WBC 11.2*  --   CREATININE 0.97  --   LATICACIDVEN 2.9* 2.3*    Estimated Creatinine Clearance: 57.8 mL/min (by C-G formula based on SCr of 0.97 mg/dL).    Allergies  Allergen Reactions   Baclofen Other (See Comments)    Weakness, confusion, tremors.     Penicillins Other (See Comments)    Has patient had a PCN reaction causing immediate rash, facial/tongue/throat swelling, SOB or lightheadedness with hypotension: No Has patient had a PCN reaction causing severe rash involving mucus membranes or skin necrosis: No Has patient had a PCN reaction that required hospitalization No Has patient had a PCN reaction occurring within the last 10 years: No If all of the above answers are "NO", then may proceed with Cephalosporin use.     Antimicrobials this admission: 9/1 Azithromycin x1  9/1 Ceftriaxone x1 9/1 Metronidazole x1 9/1 Vancomycin >>  9/1 Cefepime >>  Dose adjustments this admission: N/A  Microbiology results: 9/1 BCx: IP 9/1 MRSA PCR: negative 9/1 COVID PCR: positive  Thank you for allowing pharmacy to be a part of this patient's  care.  Celene Squibb, PharmD Clinical Pharmacist 04/25/2023 2:49 PM

## 2023-04-25 NOTE — H&P (Signed)
NAME:  Analecia Ytuarte, MRN:  [id number], DOB:  01/21/1946, LOS: 0 ADMISSION DATE:  04/25/2023, CONSULTATION DATE:  04/25/23  REFERRING MD:  ED, CHIEF COMPLAINT:  AMS  Brief Pt Description / Synopsis:   AMS  History of Present Illness:  77 year old female with CHB s/p BiV PPM, pAF on xarelto, depression, COPD (on triple inhaler), TLIF spine surgery resulting in urinary incontinence, Graves, and stroke who was last well known per husband on Friday. Since Saturday, patient has been restless and confused. She is thrashing around and difficult to control. Last night, she continued to wet her bed (more than normal) and seemingly became more agitated. She moved from bed to bed and continued to leak/spill urine. He does note history of COVID pneumonia last month and was being treated concurrently with doxycycline and prednisone. Otherwise, she was normal up until Friday night.   ED Course: Initial Vital Signs:  Today's Vitals   04/25/23 1330 04/25/23 1345 04/25/23 1351 04/25/23 1411  BP: (!) 120/46     Pulse: 69 65    Resp: (!) 24 (!) 21    Temp:    (!) 97 F (36.1 C)  TempSrc:    Axillary  SpO2: 96% 100% 96%   Weight:      Height:       Body mass index is 32.28 kg/m.  Significant Labs: Lactic Acid, Venous    Component Value Date/Time   LATICACIDVEN 2.3 (HH) 04/25/2023 1200   Had to be started on precedex gtt because patient continued to thrash around and was noted to have a prolonged Qtc. Initially given droxidopa.   Imaging Chest X-ray>> No acute findings  Medications Administered:  Rocephin, azithro, flagyl, and started on precedex gtt Received 2mg  ativan Received 2.5mg  droperidol  Pertinent  Medical History   Patient Active Problem List   Diagnosis Date Noted   Altered mental status 04/25/2023   Osteoarthritis of left knee 12/23/2020   Dupuytren's contracture 09/12/2019   Mild left ventricular systolic dysfunction 05/25/2019   Anticoagulation adequate with  anticoagulant therapy 10/28/2018   Complete heart block, transient (HCC) 10/28/2018   COPD (chronic obstructive pulmonary disease) (HCC) 10/28/2018   Penicillin allergy 10/28/2018   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   Lumbar radiculopathy 06/29/2018   Chronic low back pain with sciatica 02/15/2018   Reactive airway disease, mild intermittent, uncomplicated 08/10/2017   Chronic seasonal allergic rhinitis due to pollen 08/10/2017   Mixed hyperlipidemia 08/10/2017   Depression 08/10/2017   Class 1 obesity due to excess calories without serious comorbidity with body mass index (BMI) of 34.0 to 34.9 in adult 08/10/2017   Chronic cough 06/15/2017   Interstitial lung disease (HCC) 06/15/2017   Centrilobular emphysema (HCC) 04/02/2017   Lumbar disc disease 04/02/2017   Hepatic steatosis 04/02/2017   Atherosclerosis of coronary artery of native heart 04/02/2017   Cardiac pacemaker in situ 01/06/2017   Paroxysmal A-fib (HCC) 03/30/2016   Syncope and collapse 03/11/2016   Right leg weakness 03/11/2016   Essential hypertension 03/11/2016   Hyperglycemia 03/11/2016   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 05/15/2015   Reactive airway disease 03/04/2015   Hypothyroid 12/07/2014   Familial multiple lipoprotein-type hyperlipidemia 12/07/2014   Acute bronchitis 12/07/2014   Recurrent major depressive episodes (HCC) 12/07/2014   Essential (primary) hypertension 12/07/2014   H/O endocrine disorder 12/07/2014   Pre-operative examination 12/07/2014   History of depression 09/22/2012   Fitting or adjustment of cardiac pacemaker 12/02/2011   Chronic gastritis 07/24/1998  Micro Data:   Cultures pending  Antimicrobials:   Anti-infectives (From admission, onward)    Start     Dose/Rate Route Frequency Ordered Stop   04/26/23 1000  vancomycin (VANCOREADY) IVPB 1250 mg/250 mL        1,250 mg 166.7 mL/hr over 90 Minutes Intravenous Every 24 hours  04/25/23 1451     04/25/23 1100  vancomycin (VANCOREADY) IVPB 2000 mg/400 mL        2,000 mg 200 mL/hr over 120 Minutes Intravenous  Once 04/25/23 1047     04/25/23 1045  metroNIDAZOLE (FLAGYL) IVPB 500 mg  Status:  Discontinued        500 mg 100 mL/hr over 60 Minutes Intravenous  Once 04/25/23 1039 04/25/23 1408   04/25/23 1000  cefTRIAXone (ROCEPHIN) 1 g in sodium chloride 0.9 % 100 mL IVPB        1 g 200 mL/hr over 30 Minutes Intravenous  Once 04/25/23 0955 04/25/23 1044   04/25/23 1000  azithromycin (ZITHROMAX) 500 mg in sodium chloride 0.9 % 250 mL IVPB        500 mg 250 mL/hr over 60 Minutes Intravenous  Once 04/25/23 0955 04/25/23 1317        Significant Hospital Events: Including procedures, antibiotic start and stop dates in addition to other pertinent events   9/1: admitted to ICU for AMS  Interim History / Subjective:  24/s: brought in by husband for over 24 hours of increasing confusion, thrashing around, and worsening urinary incontinence  Objective   Blood pressure (!) 120/46, pulse 65, temperature (!) 97 F (36.1 C), temperature source Axillary, resp. rate (!) 21, height 5\' 6"  (1.676 m), weight 90.7 kg, SpO2 96%.        Intake/Output Summary (Last 24 hours) at 04/25/2023 1432 Last data filed at 04/25/2023 1414 Gross per 24 hour  Intake 1800 ml  Output --  Net 1800 ml   Filed Weights   04/25/23 0944  Weight: 90.7 kg    Examination: General: elderly obese female who is difficult to arouse HENT: snoring Lungs: diminished breath sounds in BL bases Cardiovascular: irregularly irregular Abdomen: distended Extremities: no peripheral edema Neuro: difficult to arouse GU: urinary incontinence  Resolved Hospital Problem list   none  Assessment & Plan:   #AMS - unclear cause - remains on precedex gtt  - NCHCT negative - consider LP once DOAC washout  - continue vanc and cefepime given slight increase in WBC - rule out COVID pneumonia - on vanc and  cefepime for possible infectious cause of AMS - consider LP tomorrow/last dose of xarelto was 8/31 AM  #pAF #Prolonged QTc - given anemia, we will hold xarelto - SCDs for now - monitor on continuous telemetry  #Anemia #History of hemorrhoids #Colonoscopy in 07/2021 (Dr. Timothy Lasso patient) - profound drop in Hgb without occult signs of bleeding - transfuse 2u prbc - SCDs for now - check CBC after transfusion  #History of Graves - check TSH - TSH was profoundly elevated in May - continue synthroid  #HTN - hold home meds  #HLD - hold home sttain  #COPD - hold home inhalers while patient is being rule out for COVID   Best Practice (right click and "Reselect all SmartList Selections" daily)   Diet/type: NPO DVT prophylaxis: SCD GI prophylaxis: PPI Lines: N/A Foley:  Yes, and it is still needed Code Status:  full code Last date of multidisciplinary goals of care discussion [husband]  Labs   CBC: Recent Labs  Lab 04/25/23 1000  WBC 11.2*  NEUTROABS 9.2*  HGB 5.7*  HCT 19.1*  MCV 83.8  PLT 411*    Basic Metabolic Panel: Recent Labs  Lab 04/25/23 1000  NA 142  K 3.5  CL 113*  CO2 17*  GLUCOSE 114*  BUN 23  CREATININE 0.97  CALCIUM 8.8*   GFR: Estimated Creatinine Clearance: 57.8 mL/min (by C-G formula based on SCr of 0.97 mg/dL). Recent Labs  Lab 04/25/23 1000 04/25/23 1200  WBC 11.2*  --   LATICACIDVEN 2.9* 2.3*    Liver Function Tests: Recent Labs  Lab 04/25/23 1000  AST 40  ALT 29  ALKPHOS 80  BILITOT 1.0  PROT 6.7  ALBUMIN 3.7   No results for input(s): "LIPASE", "AMYLASE" in the last 168 hours. No results for input(s): "AMMONIA" in the last 168 hours.  ABG No results found for: "PHART", "PCO2ART", "PO2ART", "HCO3", "TCO2", "ACIDBASEDEF", "O2SAT"   Coagulation Profile: No results for input(s): "INR", "PROTIME" in the last 168 hours.  Cardiac Enzymes: No results for input(s): "CKTOTAL", "CKMB", "CKMBINDEX", "TROPONINI" in the  last 168 hours.  HbA1C: Hgb A1c MFr Bld  Date/Time Value Ref Range Status  03/11/2016 02:28 PM 6.3 (H) 4.0 - 6.0 % Final    CBG: No results for input(s): "GLUCAP" in the last 168 hours.  CT Head Wo Contrast CLINICAL DATA:  Mental status change, unknown cause.  EXAM: CT HEAD WITHOUT CONTRAST  TECHNIQUE: Contiguous axial images were obtained from the base of the skull through the vertex without intravenous contrast.  RADIATION DOSE REDUCTION: This exam was performed according to the departmental dose-optimization program which includes automated exposure control, adjustment of the mA and/or kV according to patient size and/or use of iterative reconstruction technique.  COMPARISON:  CT head 03/11/2016.  FINDINGS: Brain: Remote left frontal infarct. No evidence of acute large vascular territory infarct, acute hemorrhage, mass lesion, midline shift or hydrocephalus.  Vascular: No hyperdense vessel identified.  Skull: No acute fracture no evidence.  Sinuses/Orbits: Bilateral maxillary and right sphenoid sinus air-fluid levels with scattered paranasal sinus mucosal thickening. No acute orbital findings.  Other: No mastoid effusions.  IMPRESSION: 1. No evidence of acute intracranial abnormality. 2. Paranasal sinus disease, detailed above.  Electronically Signed   By: Feliberto Harts M.D.   On: 04/25/2023 13:42 CT CHEST ABDOMEN PELVIS W CONTRAST CLINICAL DATA:  COVID 2 weeks ago. Pneumonia. Concern for sepsis. Confusion. Tachypneic.  EXAM: CT CHEST, ABDOMEN, AND PELVIS WITH CONTRAST  TECHNIQUE: Multidetector CT imaging of the chest, abdomen and pelvis was performed following the standard protocol during bolus administration of intravenous contrast.  RADIATION DOSE REDUCTION: This exam was performed according to the departmental dose-optimization program which includes automated exposure control, adjustment of the mA and/or kV according to patient size and/or  use of iterative reconstruction technique.  CONTRAST:  OMNIPAQUE IOHEXOL 300 MG/ML  SOLN  COMPARISON:  None Available.  FINDINGS: CT CHEST FINDINGS  CT CHEST FINDINGS  Cardiovascular: No significant vascular findings. Normal heart size. No pericardial effusion.  Mediastinum/Nodes: No axillary or supraclavicular adenopathy. No mediastinal or hilar adenopathy. No pericardial fluid. Esophagus normal.  Lungs/Pleura: No pneumonia. Mild basilar atelectasis. Mild centrilobular emphysema pattern in the upper lobes. There is respiratory motion which does degrade evaluation of the lungs.  Musculoskeletal: No aggressive osseous lesion.  CT ABDOMEN AND PELVIS FINDINGS  Hepatobiliary: No focal hepatic lesion. No biliary ductal dilatation. Gallbladder is normal. Common bile duct is normal.  Pancreas: Pancreas is normal. No ductal  dilatation. No pancreatic inflammation.  Spleen: Normal spleen  Adrenals/urinary tract: Adrenal glands and kidneys are normal. The ureters and bladder normal.  Stomach/Bowel: Stomach, small bowel, appendix, and cecum are normal. The colon and rectosigmoid colon are normal.  Vascular/Lymphatic: Abdominal aorta is normal caliber with atherosclerotic calcification. There is no retroperitoneal or periportal lymphadenopathy. No pelvic lymphadenopathy.  Reproductive: Post hysterectomy.  Adnexa unremarkable  Other: No free fluid.  Musculoskeletal: Severe degenerative change of the lumbar spine. Posterior lumbar fusion at L4-L5. No evidence of bone infection or inflammation.  IMPRESSION: CHEST:  1. No evidence of pneumonia. 2. Mild basilar atelectasis.  PELVIS:  No acute findings in the abdomen pelvis.  Aortic Atherosclerosis (ICD10-I70.0) and Emphysema (ICD10-J43.9).  Electronically Signed   By: Genevive Bi M.D.   On: 04/25/2023 13:24 DG Chest Port 1 View CLINICAL DATA:  Possible sepsis. COVID 2 weeks ago. Pneumonia since last  night  EXAM: PORTABLE CHEST 1 VIEW  COMPARISON:  08/31/2022  FINDINGS: Pacer again identified.  Numerous leads and wires project over the chest. Patient rotated right. Midline trachea. Mild cardiomegaly. No right pleural effusion or pneumothorax. The left costophrenic angle is poorly evaluated secondary to overlying soft tissues in this obese patient.  No congestive failure. Suboptimal evaluation of the left lung base secondary to overlying breast tissues. Otherwise, no lobar consolidation.  IMPRESSION: Cardiomegaly, without congestive failure.  Suboptimal evaluation of the inferior left hemithorax secondary to AP portable technique and patient body habitus. No convincing evidence of acute process. If concern of left lower lobe airspace disease or pleural fluid, recommend PA and lateral radiographs.  Electronically Signed   By: Jeronimo Greaves M.D.   On: 04/25/2023 10:57     Review of Systems:   Unable to obtain as patient is difficult to arouse.    Past Medical History:  She,  has a past medical history of Allergy, Chronic gastritis, Chronic low back pain with sciatica, COPD (chronic obstructive pulmonary disease) (HCC), COVID-19 (04/08/2023), Depression, Diverticulosis, Dysrhythmia, Gastritis, chronic, GERD (gastroesophageal reflux disease), Graves disease, H/O hepatitis, Hallux valgus of right foot, Hammer toe, History of hiatal hernia, History of shingles, Hyperlipidemia, Hypertension, Hypothyroidism, Incontinence, Paroxysmal atrial fibrillation (HCC), Pneumonia (04/24/2023), Presence of permanent cardiac pacemaker, and Stroke (HCC).   Surgical History:   Past Surgical History:  Procedure Laterality Date   APPENDECTOMY     COLONOSCOPY  08/24/1998   COLONOSCOPY WITH PROPOFOL N/A 04/26/2015   Procedure: COLONOSCOPY WITH PROPOFOL;  Surgeon: Wallace Cullens, MD;  Location[facility]COPY;  Service: Gastroenterology;  Laterality: N/A;   COLONOSCOPY WITH PROPOFOL N/A 07/14/2021    Procedure: COLONOSCOPY WITH PROPOFOL;  Surgeon: Jaynie Collins, DO;  Location: [facility] ENDOSCOPY;  Service: Gastroenterology;  Laterality: N/A;   EYE SURGERY     fibroid tumors     fingers and wrist   FOOT SURGERY     TONSILLECTOMY     TRANSFORAMINAL LUMBAR INTERBODY FUSION (TLIF) WITH PEDICLE SCREW FIXATION 1 LEVEL N/A 06/29/2018   Procedure: TRANSFORAMINAL LUMBAR INTERBODY FUSION (TLIF) WITH PEDICLE SCREW FIXATION 1 LEVEL;  Surgeon: Venetia Night, MD;  Location: [facility] ORS;  Service: Neurosurgery;  Laterality: N/A;   VAGINAL HYSTERECTOMY       Social History:   reports that she quit smoking about 21 years ago. Her smoking use included cigarettes. She started smoking about 54 years ago. She has a 16.5 pack-year smoking history. She has never used smokeless tobacco. She reports current alcohol use of about 1.0 standard drink of alcohol per week.  She reports that she does not use drugs.   Family History:  Her family history includes Dementia in her mother; Diabetes in her sister; Heart disease in her father. There is no history of Breast cancer.   Allergies Allergies  Allergen Reactions   Baclofen Other (See Comments)    Weakness, confusion, tremors.     Penicillins Other (See Comments)    Has patient had a PCN reaction causing immediate rash, facial/tongue/throat swelling, SOB or lightheadedness with hypotension: No Has patient had a PCN reaction causing severe rash involving mucus membranes or skin necrosis: No Has patient had a PCN reaction that required hospitalization No Has patient had a PCN reaction occurring within the last 10 years: No If all of the above answers are "NO", then may proceed with Cephalosporin use.      Home Medications  Prior to Admission medications   Medication Sig Start Date End Date Taking? Authorizing Provider  albuterol (PROVENTIL) (2.5 MG/3ML) 0.083% nebulizer solution Take 3 mLs (2.5 mg total) by nebulization every 6 (six) hours as needed  for wheezing or shortness of breath. 02/26/21  Yes Duanne Limerick, MD  chlorpheniramine-HYDROcodone (TUSSIONEX) 10-8 MG/5ML Take 5 mLs by mouth every 12 (twelve) hours as needed for cough. 04/20/23  Yes [provider]  citalopram (CELEXA) 40 MG tablet Take 1 tablet (40 mg total) by mouth daily. 10/28/22  Yes Duanne Limerick, MD  doxycycline (VIBRAMYCIN) 100 MG capsule Take 100 mg by mouth 2 (two) times daily. 04/20/23 04/30/23 Yes [provider]  Fluticasone-Umeclidin-Vilant (TRELEGY ELLIPTA) 100-62.5-25 MCG/ACT AEPB Inhale 1 Inhaler into the lungs daily.   Yes [provider]  gabapentin (NEURONTIN) 300 MG capsule Take 2 capsules (600 mg total) by mouth 3 (three) times daily. 12/03/22 11/28/23 Yes Venetia Night, MD  gemfibrozil (LOPID) 600 MG tablet Take 1 tablet (600 mg total) by mouth daily. 10/28/22  Yes Duanne Limerick, MD  levothyroxine (SYNTHROID) 112 MCG tablet Take 1 tablet (112 mcg total) by mouth daily. 10/28/22  Yes Duanne Limerick, MD  loratadine (CLARITIN) 10 MG tablet Take 1 tablet (10 mg total) by mouth daily. 10/28/22  Yes Duanne Limerick, MD  metoprolol succinate (TOPROL-XL) 50 MG 24 hr tablet Take 1 tablet (50 mg total) by mouth daily. TAKE WITH OR IMMEDIATELY FOLLOWING A MEAL. 11/25/22  Yes Elizabeth Sauer C, MD  montelukast (SINGULAIR) 10 MG tablet TAKE 1 TABLET BY MOUTH EVERY DAY 10/28/22  Yes Duanne Limerick, MD  Multiple Vitamins-Minerals (CENTRUM SILVER 50+WOMEN PO) Take 1 tablet by mouth daily.   Yes [provider]  nystatin cream (MYCOSTATIN) APPLY TO AFFECTED AREA TWICE A DAY 03/16/22  Yes Duanne Limerick, MD  omeprazole (PRILOSEC) 20 MG capsule Take 20 mg by mouth daily.   Yes [provider]  ondansetron (ZOFRAN) 4 MG tablet Take 1 tablet (4 mg total) by mouth every 8 (eight) hours as needed for nausea or vomiting. 08/28/22  Yes Duanne Limerick, MD  predniSONE (DELTASONE) 10 MG tablet Take 10 mg by mouth daily with breakfast. 04/20/23  Yes  [provider]  rivaroxaban (XARELTO) 20 MG TABS tablet Take 20 mg by mouth daily with supper. Cardiologist [facility]   Yes [provider]  simvastatin (ZOCOR) 20 MG tablet Take 1 tablet (20 mg total) by mouth daily. 10/28/22  Yes Duanne Limerick, MD  valsartan (DIOVAN) 160 MG tablet Take 1 tablet (160 mg total) by mouth daily. 11/25/22  Yes Duanne Limerick, MD  pantoprazole (PROTONIX) 40 MG tablet Take 1 tablet (40 mg total) by mouth daily. Patient not taking: Reported on 04/25/2023 08/28/22   Duanne Limerick, MD     Critical care time: 56 minutes    Rhea Bleacher MD Pulmonary & Critical Care Medicine

## 2023-04-25 NOTE — Progress Notes (Signed)
There was consult for a PIV access. Lt. UA was infiltrated by blood transfusion as well as lower arm. Assessed on Rt. UA and there is no suitable veins for a PIV access. Informed patient's RN and MD regarding this matter. Recommended central line if need access right away. HS [employer]

## 2023-04-25 NOTE — ED Notes (Signed)
Pt is still very agitated after various meds to sedate. Son at bedside. New IV was obtained, bolus and zithromax restarted.

## 2023-04-25 NOTE — ED Notes (Signed)
ICU MD and NP at bedside  

## 2023-04-25 NOTE — ED Notes (Signed)
Now pt awake and struggling. Per Modesto Charon, ok to titrate precedex more frequently. Still needs scans.

## 2023-04-25 NOTE — ED Notes (Signed)
Pt to CT and back. EDP Mod[place] Charon at bedside.

## 2023-04-25 NOTE — ED Notes (Signed)
Called CT, ok to bring pt in 3-4 min to CT 1. Pt still struggling and thrashing in bed. Have titrated precedex up and Dr Modesto Charon has come back to see pt as well.

## 2023-04-25 NOTE — ED Notes (Signed)
Pt has ready ICU bed now, defer going to pick up blood.

## 2023-04-25 NOTE — ED Notes (Signed)
Pt still thrashing in bed. EDP advised may need more meds. Will not stay still, CT came and saw pt, they stated pt will need sedation for CT study.

## 2023-04-25 NOTE — Consult Note (Signed)
PHARMACY -  BRIEF ANTIBIOTIC NOTE   Pharmacy has received consult(s) for Vancomycin from an ED provider.  The patient's profile has been reviewed for ht/wt/allergies/indication/available labs.    One time order(s) placed for Vancomycin 2000 mg   Further antibiotics/pharmacy consults should be ordered by admitting physician if indicated.                       Thank you, Sharen Hones, PharmD, BCPS Clinical Pharmacist   04/25/2023  10:47 AM

## 2023-04-25 NOTE — ED Notes (Signed)
Pt had wet the bed. Bed changed and purewick placed. Pt woke up and thrashing in bed again.

## 2023-04-25 NOTE — ED Notes (Signed)
Pt now sleeping

## 2023-04-25 NOTE — ED Notes (Signed)
Pt now has expiratory wheezing.

## 2023-04-25 NOTE — Consult Note (Signed)
PHARMACIST - PHYSICIAN COMMUNICATION  CONCERNING:  Enoxaparin (Lovenox) for DVT Prophylaxis    RECOMMENDATION: Patient was prescribed enoxaprin 40mg  q24 hours for VTE prophylaxis.   Filed Weights   04/25/23 0944  Weight: 90.7 kg (200 lb)    Body mass index is 32.28 kg/m.  Estimated Creatinine Clearance: 57.8 mL/min (by C-G formula based on SCr of 0.97 mg/dL).   Based on [facility] policy patient is candidate for enoxaparin 0.5mg /kg TBW SQ every 24 hours based on BMI being >30.  DESCRIPTION: Pharmacy has adjusted enoxaparin dose per [facility] policy.  Patient is now receiving enoxaparin 45 mg every 24 hours   Celene Squibb, PharmD Clinical Pharmacist 04/25/2023 2:11 PM

## 2023-04-25 NOTE — ED Provider Notes (Addendum)
[facility] Provider Note    Event Date/Time   First MD Initiated Contact with Patient 04/25/23 [phone]     (approximate)   History   Altered Mental Status   HPI  Danielle Taylor is a 77 y.o. female   Past medical history of COPD, chronic gastritis, chronic lower back pain, hypertension hyperlipidemia, paroxysmal atrial fibrillation on Xarelto, COVID infection last month, recently started on prednisone last week, who presents to the emergency department with altered mental status and fever.  Has been dealing with COVID infection and prescribed doxycycline and prednisone last week per patient's husband.  She been complaining of intermittent abdominal pain, and worsening altered mental status and confusion over the last 2 days.  Her cough is gotten worse and she is febrile.  Denies GI bleeding though patient's husband states that she has intermittent hemorrhoids and rectal bleeding from her hemorrhoids.  Reportedly had a minor fall last week and head strike, per husband who is at bedside.  Lives at home with her husband.   Independent Historian contributed to assessment above: Her husband provides most of the collateral information given the patient's altered mental status       Physical Exam   Triage Vital Signs: ED Triage Vitals [04/25/23 0944]  Encounter Vitals Group     BP      Systolic BP Percentile      Diastolic BP Percentile      Pulse      Resp      Temp      Temp src      SpO2      Weight 200 lb (90.7 kg)     Height 5\' 6"  (1.676 m)     Head Circumference      Peak Flow      Pain Score      Pain Loc      Pain Education      Exclude from Growth Chart     Most recent vital signs: Vitals:   04/25/23 1015 04/25/23 1220  BP: (!) 115/49 (!) 135/59  Pulse: 82 88  Resp: 13 (!) 21  Temp:    SpO2: 96% 98%    General: Awake, no distress.  CV:  Good peripheral perfusion.  Resp:  Normal effort.  Abd:  No distention.   Other:  Acutely altered, disoriented, maintaining airway, thrashing in bed.  Mild tenderness to palpation in the abdomen diffusely without rigidity or guarding.  Clear lungs without focality or wheezing.  She appears pale.  Her rectal exam shows yellow stool without blood or melena.  Her neck is supple with full range of motion she is moving all extremities spontaneously.  No signs of trauma.   ED Results / Procedures / Treatments   Labs (all labs ordered are listed, but only abnormal results are displayed) Labs Reviewed  COMPREHENSIVE METABOLIC PANEL - Abnormal; Notable for the following components:      Result Value   Chloride 113 (*)    CO2 17 (*)    Glucose, Bld 114 (*)    Calcium 8.8 (*)    All other components within normal limits  LACTIC ACID, PLASMA - Abnormal; Notable for the following components:   Lactic Acid, Venous 2.9 (*)    All other components within normal limits  LACTIC ACID, PLASMA - Abnormal; Notable for the following components:   Lactic Acid, Venous 2.3 (*)    All other components within normal limits  CBC WITH DIFFERENTIAL/PLATELET -  Abnormal; Notable for the following components:   WBC 11.2 (*)    RBC 2.28 (*)    Hemoglobin 5.7 (*)    HCT 19.1 (*)    MCH 25.0 (*)    MCHC 29.8 (*)    RDW 17.9 (*)    Platelets 411 (*)    nRBC 1.2 (*)    Neutro Abs 9.2 (*)    All other components within normal limits  URINALYSIS, W/ REFLEX TO CULTURE (INFECTION SUSPECTED) - Abnormal; Notable for the following components:   APPearance CLEAR (*)    Bilirubin Urine SMALL (*)    Ketones, ur 40 (*)    Protein, ur TRACE (*)    All other components within normal limits  CULTURE, BLOOD (ROUTINE X 2)  CULTURE, BLOOD (ROUTINE X 2)  CBG MONITORING, ED  PREPARE RBC (CROSSMATCH)  TYPE AND SCREEN     I ordered and reviewed the above labs they are notable for a lactic is elevated at 2.9 white blood cell count of 11.2 and hemoglobin of 5.7  EKG  ED ECG REPORT I, Pilar Jarvis,  the attending physician, personally viewed and interpreted this ECG.   Date: 04/25/2023  EKG Time: 1015  Rate: 83  Rhythm: sinus  Axis: nl  Intervals: rbbb; low voltage  ST&T Change: no stemi    RADIOLOGY I independently reviewed and interpreted chest x-ray and see lower lobe opacities concerning for pneumonia I also reviewed radiologist's formal read.   PROCEDURES:  Critical Care performed: Yes, see critical care procedure note(s)  .Critical Care  Performed by: Pilar Jarvis, MD Authorized by: Pilar Jarvis, MD   Critical care provider statement:    Critical care time (minutes):  30   Critical care was time spent personally by me on the following activities:  Development of treatment plan with patient or surrogate, discussions with consultants, evaluation of patient's response to treatment, examination of patient, ordering and review of laboratory studies, ordering and review of radiographic studies, ordering and performing treatments and interventions, pulse oximetry, re-evaluation of patient's condition and review of old charts    MEDICATIONS ORDERED IN ED: Medications  metroNIDAZOLE (FLAGYL) IVPB 500 mg (has no administration in time range)  0.9 %  sodium chloride infusion (has no administration in time range)  vancomycin (VANCOREADY) IVPB 2000 mg/400 mL (has no administration in time range)  dexmedetomidine (PRECEDEX) 400 MCG/100ML (4 mcg/mL) infusion (1 mcg/kg/hr  90.7 kg Intravenous Restarted 04/25/23 1244)  cefTRIAXone (ROCEPHIN) 1 g in sodium chloride 0.9 % 100 mL IVPB (0 g Intravenous Stopped 04/25/23 1044)  azithromycin (ZITHROMAX) 500 mg in sodium chloride 0.9 % 250 mL IVPB ( Intravenous Restarted 04/25/23 1202)  lactated ringers bolus 1,500 mL ( Intravenous Restarted 04/25/23 1201)  LORazepam (ATIVAN) injection 1 mg (1 mg Intramuscular Given 04/25/23 1006)  droperidol (INAPSINE) 2.5 MG/ML injection 2.5 mg (2.5 mg Intravenous Given 04/25/23 1029)  iohexol (OMNIPAQUE) 300 MG/ML  solution 100 mL (100 mLs Intravenous Contrast Given 04/25/23 1249)  droperidol (INAPSINE) 2.5 MG/ML injection 2.5 mg (2.5 mg Intravenous Given 04/25/23 1143)  LORazepam (ATIVAN) injection 2 mg (2 mg Intravenous Given 04/25/23 1144)  acetaminophen (TYLENOL) suppository 650 mg (650 mg Rectal Given 04/25/23 1238)  pantoprazole (PROTONIX) 80 mg /NS 100 mL IVPB (0 mg Intravenous Stopped 04/25/23 1243)    External physician / consultants:  I spoke with hospitalist for admission and regarding care plan for this patient.   IMPRESSION / MDM / ASSESSMENT AND PLAN / ED COURSE  I reviewed the  triage vital signs and the nursing notes.                                Patient's presentation is most consistent with acute presentation with potential threat to life or bodily function.  Differential diagnosis includes, but is not limited to, sepsis, respiratory infection, intra-abdominal infection, urinary tract infection, acute blood loss anemia, GI bleeding, adverse effect of medication   The patient is on the cardiac monitor to evaluate for evidence of arrhythmia and/or significant heart rate changes.  MDM:    Acutely altered mental status in this patient has been dealing with respiratory infection COVID, worsening respiratory infectious symptoms as well over the last several days associated with altered mental status.  I am concerned for sepsis given her fever, respiratory rate, leukocytosis and lactic acidosis.  She got 30 cc/kg sepsis bolus and initially started with respiratory infectious coverage with IV ceftriaxone and azithromycin.  However after collateral information from husband revealed some abdominal discomfort over the last several days, and reassessment of abdomen exam causes wincing, as well as 1 lactic came back elevated I broadened coverage to include IV Flagyl and IV vancomycin.  He also notes that 1 week ago she had a fall and head strike so I wonder about ICH so ordered a CT head.     Ordered a  pan scan CT chest abdomen pelvis given her respiratory infection recently and chest x-ray poor penetration due to body habitus to further assess for evidence of bacterial pneumonia, as well as abdomen and pelvis to assess for intra-abdominal infection in the setting of abdominal tenderness.  I considered meningitis as well and this altered patient with low-grade fever but in the setting of known respiratory infection worsening, anemia, I think acute metabolic encephalopathy is more likely at this time.  Also given her ongoing agitation, thrashing, LP is not feasible at this time until patient is much more sedated  Notably her hemoglobin is in the fives from a normal months ago.  No GI bleeding reported, no trauma reported, and rectal exam shows no signs of blood or melena.  She is on Xarelto but given her hemodynamic stability we will hold on reversal at this time and instead give 2 units of packed red blood cells, ongoing monitoring, give Protonix with her history of chronic gastritis and recent prednisone.     The patient had a borderline prolonged QTc at 500.  She was requiring sedation to facilitate workup, IV placement, CT scanning, and got a total of 4 mg of Ativan and 5 mg of droperidol and despite this was still agitated, flailing, thrashing, and CT scan was delayed due to her acute agitation.  I started her on a Precedex infusion for sedation to facilitate workup and CT scanning.  Will review the results of her CT scanning, trial off of Precedex and try intermittent IV sedation and see how she does and determine disposition level of care.      FINAL CLINICAL IMPRESSION(S) / ED DIAGNOSES   Final diagnoses:  Altered mental status, unspecified altered mental status type  Anemia, unspecified type  Sepsis, due to unspecified organism, unspecified whether acute organ dysfunction present [facility])     Rx / DC Orders   ED Discharge Orders     None        Note:  This document was prepared  using Dragon voice recognition software and may include unintentional dictation errors.  Pilar Jarvis, MD 04/25/23 1158    Pilar Jarvis, MD 04/25/23 1250    Pilar Jarvis, MD 04/25/23 1250    Pilar Jarvis, MD 04/25/23 [phone]

## 2023-04-26 DIAGNOSIS — R4182 Altered mental status, unspecified: Secondary | ICD-10-CM | POA: Diagnosis not present

## 2023-04-26 DIAGNOSIS — A419 Sepsis, unspecified organism: Secondary | ICD-10-CM | POA: Diagnosis not present

## 2023-04-26 LAB — TYPE AND SCREEN
ABO/RH(D): O POS
Antibody Screen: NEGATIVE
Unit division: 0
Unit division: 0

## 2023-04-26 LAB — CBC
HCT: 26.2 % — ABNORMAL LOW (ref 36.0–46.0)
Hemoglobin: 8.3 g/dL — ABNORMAL LOW (ref 12.0–15.0)
MCH: 26.3 pg (ref 26.0–34.0)
MCHC: 31.7 g/dL (ref 30.0–36.0)
MCV: 83.2 fL (ref 80.0–100.0)
Platelets: 313 10*3/uL (ref 150–400)
RBC: 3.15 MIL/uL — ABNORMAL LOW (ref 3.87–5.11)
RDW: 17.1 % — ABNORMAL HIGH (ref 11.5–15.5)
WBC: 7.9 10*3/uL (ref 4.0–10.5)
nRBC: 1.7 % — ABNORMAL HIGH (ref 0.0–0.2)

## 2023-04-26 LAB — BASIC METABOLIC PANEL
Anion gap: 9 (ref 5–15)
BUN: 17 mg/dL (ref 8–23)
CO2: 22 mmol/L (ref 22–32)
Calcium: 8.3 mg/dL — ABNORMAL LOW (ref 8.9–10.3)
Chloride: 109 mmol/L (ref 98–111)
Creatinine, Ser: 0.81 mg/dL (ref 0.44–1.00)
GFR, Estimated: >60 mL/min (ref >60)
Glucose, Bld: 153 mg/dL — ABNORMAL HIGH (ref 70–99)
Potassium: 3.5 mmol/L (ref 3.5–5.1)
Sodium: 140 mmol/L (ref 135–145)

## 2023-04-26 LAB — BPAM RBC
Blood Product Expiration Date: 202410032359
Blood Product Expiration Date: 202410032359
ISSUE DATE / TIME: 202409011457
ISSUE DATE / TIME: 202409011755
Unit Type and Rh: 5100
Unit Type and Rh: 5100

## 2023-04-26 LAB — PHOSPHORUS: Phosphorus: 3.8 mg/dL (ref 2.5–4.6)

## 2023-04-26 LAB — MAGNESIUM: Magnesium: 2 mg/dL (ref 1.7–2.4)

## 2023-04-26 MED ORDER — VANCOMYCIN HCL 750 MG/150ML IV SOLN
750.0000 mg | Freq: Two times a day (BID) | INTRAVENOUS | Status: DC
  Administered 2023-04-27 – 2023-04-28 (×3): 750 mg via INTRAVENOUS
  Filled 2023-04-26 (×3): qty 150

## 2023-04-26 MED ORDER — SODIUM CHLORIDE 0.9 % IV SOLN
2.0000 g | Freq: Two times a day (BID) | INTRAVENOUS | Status: DC
  Administered 2023-04-26 – 2023-04-27 (×3): 2 g via INTRAVENOUS
  Filled 2023-04-26 (×4): qty 20

## 2023-04-26 MED ORDER — DEXMEDETOMIDINE HCL IN NACL 400 MCG/100ML IV SOLN
0.0000 ug/kg/h | INTRAVENOUS | Status: DC
  Administered 2023-04-26: 0.4 ug/kg/h via INTRAVENOUS
  Administered 2023-04-27: 1 ug/kg/h via INTRAVENOUS
  Administered 2023-04-27: 0.7 ug/kg/h via INTRAVENOUS
  Administered 2023-04-27: 0.6 ug/kg/h via INTRAVENOUS
  Administered 2023-04-27: 1 ug/kg/h via INTRAVENOUS
  Filled 2023-04-26 (×6): qty 100

## 2023-04-26 MED ORDER — SULFAMETHOXAZOLE-TRIMETHOPRIM 400-80 MG/5ML IV SOLN
200.0000 mg | Freq: Three times a day (TID) | INTRAVENOUS | Status: DC
  Administered 2023-04-26 – 2023-04-27 (×4): 200 mg via INTRAVENOUS
  Filled 2023-04-26 (×5): qty 12.5

## 2023-04-26 MED ORDER — MORPHINE SULFATE (PF) 2 MG/ML IV SOLN: 2.0000 mg | Freq: Once | INTRAVENOUS | Status: AC

## 2023-04-26 MED ORDER — DEXTROSE 5 % IV SOLN
10.0000 mg/kg | Freq: Three times a day (TID) | INTRAVENOUS | Status: DC
  Administered 2023-04-26 – 2023-04-28 (×7): 690 mg via INTRAVENOUS
  Filled 2023-04-26 (×8): qty 13.8

## 2023-04-26 MED ORDER — HALOPERIDOL LACTATE 5 MG/ML IJ SOLN: 5.0000 mg | Freq: Once | INTRAMUSCULAR | Status: AC

## 2023-04-26 MED ORDER — LACTATED RINGERS IV SOLN: INTRAVENOUS | Status: DC

## 2023-04-26 MED ORDER — MORPHINE SULFATE (PF) 2 MG/ML IV SOLN
INTRAVENOUS | Status: AC
  Administered 2023-04-26: 2 mg via INTRAVENOUS
  Filled 2023-04-26: qty 1

## 2023-04-26 MED ORDER — SODIUM CHLORIDE 0.9 % IV SOLN
2.0000 g | Freq: Three times a day (TID) | INTRAVENOUS | Status: DC
  Administered 2023-04-26: 2 g via INTRAVENOUS
  Filled 2023-04-26: qty 12.5

## 2023-04-26 MED ORDER — HALOPERIDOL LACTATE 5 MG/ML IJ SOLN
INTRAMUSCULAR | Status: AC
  Administered 2023-04-26: 5 mg via INTRAVENOUS
  Filled 2023-04-26: qty 1

## 2023-04-26 NOTE — Consult Note (Signed)
Pharmacy Antibiotic Note  Danielle Taylor is a 77 y.o. female admitted on 04/25/2023 with sepsis. PMH significant for familial HLD, HTN, depression, obesity, COPD, AF. Patient has listed allergy to penicillins but has tolerated ceftriaxone. Infectious workup ongoing. Pharmacy has been consulted for vancomycin, cefepime, and acyclovir dosing.   Plan: Day 2 of antibiotics Scr 0.81 today with estimated CrCl 66.4 mL/min Adjust vancomycin to 750 mg Q12H  Goal AUC 400-550 Expected AUC 481.6 Expected Cmin 15.2 Scr used 0.81, IBW, Vd 0.72 Start ceftriaxone 2 gm Q12H Start sulfamethoxazole/trimethoprim 200 mg Q8H Start acyclovir 690 mg Q8H Continue to monitor renal function and follow culture results  Height: 5\' 6"  (167.6 cm) Weight: 83.6 kg (184 lb 4.9 oz) IBW/kg (Calculated) : 59.3  Temp (24hrs), Avg:97.4 F (36.3 C), Min:96.3 F (35.7 C), Max:98.8 F (37.1 C)  Recent Labs  Lab 04/25/23 1000 04/25/23 1200 04/26/23 0328  WBC 11.2*  --  7.9  CREATININE 0.97  --  0.81  LATICACIDVEN 2.9* 2.3*  --     Estimated Creatinine Clearance: 66.4 mL/min (by C-G formula based on SCr of 0.81 mg/dL).    Allergies  Allergen Reactions   Baclofen Other (See Comments)    Weakness, confusion, tremors.     Penicillins Other (See Comments)    Has patient had a PCN reaction causing immediate rash, facial/tongue/throat swelling, SOB or lightheadedness with hypotension: No Has patient had a PCN reaction causing severe rash involving mucus membranes or skin necrosis: No Has patient had a PCN reaction that required hospitalization No Has patient had a PCN reaction occurring within the last 10 years: No If all of the above answers are "NO", then may proceed with Cephalosporin use.    Antimicrobials this admission: 9/1 Azithromycin x1  9/1 Ceftriaxone x1 9/1 Metronidazole x1 9/1 Cefepime >> 9/2 9/1 Vancomycin >>  9/2 Acyclovir >>  9/2 Sulfamethoxazole/trimethoprim >>   Dose adjustments this  admission:  Microbiology results: 9/1 BCx: NG < 24H 9/1 MRSA PCR: negative 9/1 COVID PCR: positive 9/2 Lumbar puncture: ordered  Thank you for allowing pharmacy to be a part of this patient's care.  Littie Deeds, PharmD PGY1 Pharmacy Resident 04/26/2023 9:49 AM

## 2023-04-26 NOTE — Progress Notes (Signed)
Patient's watch was removed from her left upper extremity d/t swelling and medical equipment and given to her husband, Verdean Brando, to be brought home. Melissa Cobb RN was present at this time.

## 2023-04-26 NOTE — Consult Note (Signed)
NEURO HOSPITALIST CONSULT NOTE   Requesting physician: Dr. Arville Lime  Reason for Consult: Altered mental status  History obtained from:  Husband and Chart     HPI:                                                                                                                                          Danielle Taylor is a 77 y.o. female with a PMHx of COPD, HTN, pAF (on Xarelto), HLD, chronic gastritis, chronic LBP with sciatica, recent treatment for Covid PNA (diagnosed last month, recently prescribed doxycycline and prednisone last week per patient's husband) who remains Covid positive, Graves disease, hypothyroidism, bladder and bowel incontinence, prior stroke and third degree atrioventricular block who presented to the ED on Sunday morning via EMS with altered mental status and fever. Last known normal was Friday night. She had been complaining of intermittent abdominal pain together with worsening altered mental status and confusion over the past 2 days. Her cough had gotten worse. She was alert and oriented to self only on arrival, tachypneic, febrile, restless and thrashing in her bed. Abdominal exam by EDP revealed tenderness. Lactate was elevated and leukocytosis was noted on CBC. ABX were started. Hgb was low (in the fives) relative to a normal value months ago. No GI bleeding was reported. She was administered 2 units of PRBCs. Xarelto was stopped. Overall impression by EDP was that her AMS was most likely secondary to metabolic encephalopathy, with meningitis also possible but lower on the DDx. LP was not possible in the ED due to her agitation. She was started on Precedex.   CT head revealed a small, remote left frontal lobe cortically-based infarct. No acute abnormalities were seen.  She is being empirically treated for possible bacterial meningitis. LP is pending.   Husband states that she is much better today than yesterday, in terms of her mentation and breathing.    Past Medical History:  Diagnosis Date   Allergy    Chronic gastritis    Chronic low back pain with sciatica    COPD (chronic obstructive pulmonary disease) (HCC)    COVID-19 04/08/2023   Depression    Diverticulosis    Dysrhythmia    Complete Heart Block   Gastritis, chronic    GERD (gastroesophageal reflux disease)    Graves disease    H/O hepatitis    Hallux valgus of right foot    Hammer toe    History of hiatal hernia    History of shingles    Hyperlipidemia    Hypertension    Hypothyroidism    Incontinence    bladder and bowel   Paroxysmal atrial fibrillation (HCC)    Pneumonia 04/24/2023   Presence of permanent cardiac pacemaker    2012   Stroke (  [facility])     Past Surgical History:  Procedure Laterality Date   APPENDECTOMY     COLONOSCOPY  08/24/1998   COLONOSCOPY WITH PROPOFOL N/A 04/26/2015   Procedure: COLONOSCOPY WITH PROPOFOL;  Surgeon: Wallace Cullens, MD;  Location: [facility] ENDOSCOPY;  Service: Gastroenterology;  Laterality: N/A;   COLONOSCOPY WITH PROPOFOL N/A 07/14/2021   Procedure: COLONOSCOPY WITH PROPOFOL;  Surgeon: Jaynie Collins, DO;  Location: [facility] ENDOSCOPY;  Service: Gastroenterology;  Laterality: N/A;   EYE SURGERY     fibroid tumors     fingers and wrist   FOOT SURGERY     TONSILLECTOMY     TRANSFORAMINAL LUMBAR INTERBODY FUSION (TLIF) WITH PEDICLE SCREW FIXATION 1 LEVEL N/A 06/29/2018   Procedure: TRANSFORAMINAL LUMBAR INTERBODY FUSION (TLIF) WITH PEDICLE SCREW FIXATION 1 LEVEL;  Surgeon: Venetia Night, MD;  Location: [facility] ORS;  Service: Neurosurgery;  Laterality: N/A;   VAGINAL HYSTERECTOMY      Family History  Problem Relation Age of Onset   Dementia Mother    Heart disease Father    Diabetes Sister    Breast cancer Neg Hx             Social History:  reports that she quit smoking about 21 years ago. Her smoking use included cigarettes. She started smoking about 54 years ago. She has a 16.5 pack-year smoking history. She has  never used smokeless tobacco. She reports current alcohol use of about 1.0 standard drink of alcohol per week. She reports that she does not use drugs.  Allergies  Allergen Reactions   Baclofen Other (See Comments)    Weakness, confusion, tremors.     Penicillins Other (See Comments)    Has patient had a PCN reaction causing immediate rash, facial/tongue/throat swelling, SOB or lightheadedness with hypotension: No Has patient had a PCN reaction causing severe rash involving mucus membranes or skin necrosis: No Has patient had a PCN reaction that required hospitalization No Has patient had a PCN reaction occurring within the last 10 years: No If all of the above answers are "NO", then may proceed with Cephalosporin use.     MEDICATIONS:                                                                                                                     Prior to Admission:  Facility-Administered Medications Prior to Admission  Medication Dose Route Frequency Provider Last Rate Last Admin   albuterol (PROVENTIL) (2.5 MG/3ML) 0.083% nebulizer solution 2.5 mg  2.5 mg Nebulization Once Jones, Deanna C, MD       ipratropium-albuterol (DUONEB) 0.5-2.5 (3) MG/3ML nebulizer solution 3 mL  3 mL Nebulization Q6H Jones, Deanna C, MD       Medications Prior to Admission  Medication Sig Dispense Refill Last Dose   albuterol (PROVENTIL) (2.5 MG/3ML) 0.083% nebulizer solution Take 3 mLs (2.5 mg total) by nebulization every 6 (six) hours as needed for wheezing or shortness of breath. 150 mL 1 unk   chlorpheniramine-HYDROcodone (TUSSIONEX) 10-8  MG/5ML Take 5 mLs by mouth every 12 (twelve) hours as needed for cough.   unk   citalopram (CELEXA) 40 MG tablet Take 1 tablet (40 mg total) by mouth daily. 90 tablet 1 04/24/2023   doxycycline (VIBRAMYCIN) 100 MG capsule Take 100 mg by mouth 2 (two) times daily.   04/24/2023   Fluticasone-Umeclidin-Vilant (TRELEGY ELLIPTA) 100-62.5-25 MCG/ACT AEPB Inhale 1 Inhaler into  the lungs daily.   04/24/2023   gabapentin (NEURONTIN) 300 MG capsule Take 2 capsules (600 mg total) by mouth 3 (three) times daily. 540 capsule 3 04/24/2023   gemfibrozil (LOPID) 600 MG tablet Take 1 tablet (600 mg total) by mouth daily. 90 tablet 1 04/24/2023   levothyroxine (SYNTHROID) 112 MCG tablet Take 1 tablet (112 mcg total) by mouth daily. 90 tablet 1 04/24/2023   loratadine (CLARITIN) 10 MG tablet Take 1 tablet (10 mg total) by mouth daily. 90 tablet 1 04/24/2023   metoprolol succinate (TOPROL-XL) 50 MG 24 hr tablet Take 1 tablet (50 mg total) by mouth daily. TAKE WITH OR IMMEDIATELY FOLLOWING A MEAL. 90 tablet 1 04/24/2023   montelukast (SINGULAIR) 10 MG tablet TAKE 1 TABLET BY MOUTH EVERY DAY 90 tablet 1 04/24/2023   Multiple Vitamins-Minerals (CENTRUM SILVER 50+WOMEN PO) Take 1 tablet by mouth daily.   04/24/2023   nystatin cream (MYCOSTATIN) APPLY TO AFFECTED AREA TWICE A DAY 30 g 0 unk   omeprazole (PRILOSEC) 20 MG capsule Take 20 mg by mouth daily.   04/24/2023   ondansetron (ZOFRAN) 4 MG tablet Take 1 tablet (4 mg total) by mouth every 8 (eight) hours as needed for nausea or vomiting. 20 tablet 1 unk   predniSONE (DELTASONE) 10 MG tablet Take 10 mg by mouth daily with breakfast.   04/24/2023   rivaroxaban (XARELTO) 20 MG TABS tablet Take 20 mg by mouth daily with supper. Cardiologist [facility]   04/24/2023   simvastatin (ZOCOR) 20 MG tablet Take 1 tablet (20 mg total) by mouth daily. 90 tablet 1 04/24/2023   valsartan (DIOVAN) 160 MG tablet Take 1 tablet (160 mg total) by mouth daily. 90 tablet 1 04/24/2023   pantoprazole (PROTONIX) 40 MG tablet Take 1 tablet (40 mg total) by mouth daily. (Patient not taking: Reported on 04/25/2023) 30 tablet 3 Not Taking   Scheduled:  Chlorhexidine Gluconate Cloth  6 each Topical Q0600   dexamethasone (DECADRON) injection  10 mg Intravenous Q6H   Ipratropium-Albuterol  1 puff Inhalation Q4H   levothyroxine  100 mcg Oral Q0600   [START ON 04/29/2023] pantoprazole   40 mg Intravenous Q12H   Continuous:  ceFEPime (MAXIPIME) IV 2 g (04/26/23 0750)   dexmedetomidine (PRECEDEX) IV infusion 1 mcg/kg/hr (04/26/23 0700)   pantoprazole 8 mg/hr (04/26/23 0750)   vancomycin       ROS:  Unable to obtain due to AMS.    Blood pressure (!) 164/84, pulse 64, temperature 97.7 F (36.5 C), resp. rate 17, height 5\' 6"  (1.676 m), weight 83.6 kg, SpO2 94%.   General Examination:                                                                                                       Physical Exam HEENT- [place]/AT. No nuchal rigidity in flexion/extension or neck rotation. Skin temp to forehead is normal, without diaphoresis.  Lungs- Respirations unlabored Extremities- Warm and well-perfused  Neurological Examination Mental Status: Sedated on Precedex at 0.6 micrograms/kg/hr. Oriented to her name, presence of her husband in the room, "hospital",  and year, but not the city, day of the week or month. Replies to questions with short phrases. Some verbal perseveration is noted when testing orientation. No dysfluency noted on limited exam. No significant dysarthria in the context of her drowsiness .Follows simple commands. Cranial Nerves: II: PERRL. III,IV, VI: Tracks to left and right with conjugate EOM. No nystagmus.  V: FT sensation equal bilaterally VII: Smile symmetric VIII: Hearing intact to voice IX,X: No hypophonia or hoarseness XI: Symmetric XII: Midline tongue extension Motor: BUE 4-/5 biceps, triceps and grip. No asymmetry noted.  BLE: Extends partially at knees but is poorly cooperative, precluding formal strength testing.  Sensory: FT intact x 4. No extinction to DSS. Deep Tendon Reflexes: 1+ and symmetric bilateral brachioradialis Cerebellar: No ataxia with mitt-finger-nose bilaterally Gait: Unable to assess    Lab  Results: Basic Metabolic Panel: Recent Labs  Lab 04/25/23 1000 04/26/23 0328  NA 142 140  K 3.5 3.5  CL 113* 109  CO2 17* 22  GLUCOSE 114* 153*  BUN 23 17  CREATININE 0.97 0.81  CALCIUM 8.8* 8.3*  MG  --  2.0  PHOS  --  3.8    CBC: Recent Labs  Lab 04/25/23 1000 04/25/23 2228 04/26/23 0328  WBC 11.2*  --  7.9  NEUTROABS 9.2*  --   --   HGB 5.7* 7.9* 8.3*  HCT 19.1* 25.3* 26.2*  MCV 83.8  --  83.2  PLT 411*  --  313    Cardiac Enzymes: No results for input(s): "CKTOTAL", "CKMB", "CKMBINDEX", "TROPONINI" in the last 168 hours.  Lipid Panel: No results for input(s): "CHOL", "TRIG", "HDL", "CHOLHDL", "VLDL", "LDLCALC" in the last 168 hours.  Imaging: CT Head Wo Contrast  Result Date: 04/25/2023 CLINICAL DATA:  Mental status change, unknown cause. EXAM: CT HEAD WITHOUT CONTRAST TECHNIQUE: Contiguous axial images were obtained from the base of the skull through the vertex without intravenous contrast. RADIATION DOSE REDUCTION: This exam was performed according to the departmental dose-optimization program which includes automated exposure control, adjustment of the mA and/or kV according to patient size and/or use of iterative reconstruction technique. COMPARISON:  CT head 03/11/2016. FINDINGS: Brain: Remote left frontal infarct. No evidence of acute large vascular territory infarct, acute hemorrhage, mass lesion, midline shift or hydrocephalus. Vascular: No hyperdense vessel identified. Skull: No acute fracture no evidence. Sinuses/Orbits: Bilateral maxillary and right sphenoid sinus air-fluid  levels with scattered paranasal sinus mucosal thickening. No acute orbital findings. Other: No mastoid effusions. IMPRESSION: 1. No evidence of acute intracranial abnormality. 2. Paranasal sinus disease, detailed above. Electronically Signed   By: Feliberto Harts M.D.   On: 04/25/2023 13:42   CT CHEST ABDOMEN PELVIS W CONTRAST  Result Date: 04/25/2023 CLINICAL DATA:  COVID 2 weeks ago.  Pneumonia. Concern for sepsis. Confusion. Tachypneic. EXAM: CT CHEST, ABDOMEN, AND PELVIS WITH CONTRAST TECHNIQUE: Multidetector CT imaging of the chest, abdomen and pelvis was performed following the standard protocol during bolus administration of intravenous contrast. RADIATION DOSE REDUCTION: This exam was performed according to the departmental dose-optimization program which includes automated exposure control, adjustment of the mA and/or kV according to patient size and/or use of iterative reconstruction technique. CONTRAST:  OMNIPAQUE IOHEXOL 300 MG/ML  SOLN COMPARISON:  None Available. FINDINGS: CT CHEST FINDINGS CT CHEST FINDINGS Cardiovascular: No significant vascular findings. Normal heart size. No pericardial effusion. Mediastinum/Nodes: No axillary or supraclavicular adenopathy. No mediastinal or hilar adenopathy. No pericardial fluid. Esophagus normal. Lungs/Pleura: No pneumonia. Mild basilar atelectasis. Mild centrilobular emphysema pattern in the upper lobes. There is respiratory motion which does degrade evaluation of the lungs. Musculoskeletal: No aggressive osseous lesion. CT ABDOMEN AND PELVIS FINDINGS Hepatobiliary: No focal hepatic lesion. No biliary ductal dilatation. Gallbladder is normal. Common bile duct is normal. Pancreas: Pancreas is normal. No ductal dilatation. No pancreatic inflammation. Spleen: Normal spleen Adrenals/urinary tract: Adrenal glands and kidneys are normal. The ureters and bladder normal. Stomach/Bowel: Stomach, small bowel, appendix, and cecum are normal. The colon and rectosigmoid colon are normal. Vascular/Lymphatic: Abdominal aorta is normal caliber with atherosclerotic calcification. There is no retroperitoneal or periportal lymphadenopathy. No pelvic lymphadenopathy. Reproductive: Post hysterectomy.  Adnexa unremarkable Other: No free fluid. Musculoskeletal: Severe degenerative change of the lumbar spine. Posterior lumbar fusion at L4-L5. No evidence of bone  infection or inflammation. IMPRESSION: CHEST: 1. No evidence of pneumonia. 2. Mild basilar atelectasis. PELVIS: No acute findings in the abdomen pelvis. Aortic Atherosclerosis (ICD10-I70.0) and Emphysema (ICD10-J43.9). Electronically Signed   By: Genevive Bi M.D.   On: 04/25/2023 13:24   DG Chest Port 1 View  Result Date: 04/25/2023 CLINICAL DATA:  Possible sepsis. COVID 2 weeks ago. Pneumonia since last night EXAM: PORTABLE CHEST 1 VIEW COMPARISON:  08/31/2022 FINDINGS: Pacer again identified. Numerous leads and wires project over the chest. Patient rotated right. Midline trachea. Mild cardiomegaly. No right pleural effusion or pneumothorax. The left costophrenic angle is poorly evaluated secondary to overlying soft tissues in this obese patient. No congestive failure. Suboptimal evaluation of the left lung base secondary to overlying breast tissues. Otherwise, no lobar consolidation. IMPRESSION: Cardiomegaly, without congestive failure. Suboptimal evaluation of the inferior left hemithorax secondary to AP portable technique and patient body habitus. No convincing evidence of acute process. If concern of left lower lobe airspace disease or pleural fluid, recommend PA and lateral radiographs. Electronically Signed   By: Jeronimo Greaves M.D.   On: 04/25/2023 10:57     Assessment: 77 y.o. female with a PMHx of COPD, HTN, pAF (on Xarelto), HLD, chronic gastritis, chronic LBP with sciatica, recent treatment for Covid PNA (diagnosed last month, recently prescribed doxycycline and prednisone last week per patient's husband) who remains Covid positive, Graves disease, hypothyroidism, bladder and bowel incontinence, prior stroke and third degree atrioventricular block who presented to the ED on Sunday morning via EMS with altered mental status and fever. Last known normal was Friday night. She had been complaining of intermittent  abdominal pain together with worsening altered mental status and confusion over the  past 2 days. Her cough had gotten worse. She was alert and oriented to self only on arrival, tachypneic, febrile, restless and thrashing in her bed. Abdominal exam by EDP revealed tenderness. Lactate was elevated and leukocytosis was noted on CBC. ABX were started. Hgb was low (in the fives) relative to a normal value months ago. No GI bleeding was reported. She was administered 2 units of PRBCs. Xarelto was stopped. Overall impression by EDP was that her AMS was most likely secondary to metabolic encephalopathy, with meningitis also possible but lower on the DDx. LP was not possible in the ED due to her agitation. She was started on Precedex. CT head revealed a small, remote left frontal lobe cortically-based infarct. No acute abnormalities were seen. She is being empirically treated for possible bacterial meningitis. LP is pending. Husband states that she is much better today than yesterday, in terms of her mentation and breathing.  - Exam reveals a sedated patient (on Precedex at 0.6 micrograms/kg/hr) who can answer questions with short phrase and follow some commands. No neck stiffness. - CT head: Small, remote left frontal lobe cortically-based infarct. No acute abnormalities are seen. No significant cerebral atrophy for age.  - Unable to obtain MRI due to pacemaker.  - Rapid partial improvement in mentation with ABX is faster than would be expected with a meningitis. Also her exam shows no nuchal rigidity. Her AMS is most likely secondary to a metabolic encephalopathy in the setting of infection and worsened respiratory function.    Recommendations: - Can discontinue empiric acyclovir  - No strong indication for LP from a Neurology standpoint, given that her pneumonia is the most likely etiology for her AMS  - Most likely can modify her ABX regimen to cover her PNA and discontinue the empiric meningitis coverage. Would touch base with ID to determine if they agree or if they would recommend LP and  continued empiric ABX coverage for now.  - Continue to monitor for further improvement.   45 minutes spent in the neurological evaluation and management of this critically ill patient.   Electronically signed: Dr. Caryl Pina 04/26/2023, 8:48 AM

## 2023-04-26 NOTE — Progress Notes (Addendum)
NAME:  Danielle Taylor, MRN:  [id number], DOB:  Apr 08, 1946, LOS: 1 ADMISSION DATE:  04/25/2023, CONSULTATION DATE:  04/26/23  REFERRING MD:  ED, CHIEF COMPLAINT:  AMS  Brief Pt Description / Synopsis:   AMS  History of Present Illness:  77 year old female with CHB s/p BiV PPM, pAF on xarelto, depression, COPD (on triple inhaler), TLIF spine surgery resulting in urinary incontinence, Graves, and stroke who was last well known per husband on Friday. Since Saturday, patient has been restless and confused. She is thrashing around and difficult to control. Last night, she continued to wet her bed (more than normal) and seemingly became more agitated. She moved from bed to bed and continued to leak/spill urine. He does note history of COVID pneumonia last month and was being treated concurrently with doxycycline and prednisone. Otherwise, she was normal up until Friday night.   ED Course: Initial Vital Signs:  Today's Vitals   04/26/23 0500 04/26/23 0600 04/26/23 0700 04/26/23 0800  BP: (!) 169/81 (!) 157/80 (!) 167/75 (!) 164/84  Pulse: 60 73 68 64  Resp: (!) 22 (!) 41 20 17  Temp: 98.8 F (37.1 C) 98.4 F (36.9 C) 97.9 F (36.6 C) 97.7 F (36.5 C)  TempSrc:      SpO2: 100% 95% 93% 94%  Weight: 83.6 kg     Height:      PainSc:       Body mass index is 29.75 kg/m.  Significant Labs: Lactic Acid, Venous    Component Value Date/Time   LATICACIDVEN 2.3 (HH) 04/25/2023 1200   Had to be started on precedex gtt because patient continued to thrash around and was noted to have a prolonged Qtc. Initially given droxidopa.   Imaging Chest X-ray>> No acute findings  Medications Administered:  Rocephin, azithro, flagyl, and started on precedex gtt Received 2mg  ativan Received 2.5mg  droperidol  Pertinent  Medical History   Patient Active Problem List   Diagnosis Date Noted   Altered mental status 04/25/2023   Osteoarthritis of left knee 12/23/2020   Dupuytren's contracture  09/12/2019   Mild left ventricular systolic dysfunction 05/25/2019   Anticoagulation adequate with anticoagulant therapy 10/28/2018   Complete heart block, transient (HCC) 10/28/2018   COPD (chronic obstructive pulmonary disease) (HCC) 10/28/2018   Penicillin allergy 10/28/2018   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   Lumbar radiculopathy 06/29/2018   Chronic low back pain with sciatica 02/15/2018   Reactive airway disease, mild intermittent, uncomplicated 08/10/2017   Chronic seasonal allergic rhinitis due to pollen 08/10/2017   Mixed hyperlipidemia 08/10/2017   Depression 08/10/2017   Class 1 obesity due to excess calories without serious comorbidity with body mass index (BMI) of 34.0 to 34.9 in adult 08/10/2017   Chronic cough 06/15/2017   Interstitial lung disease (HCC) 06/15/2017   Centrilobular emphysema (HCC) 04/02/2017   Lumbar disc disease 04/02/2017   Hepatic steatosis 04/02/2017   Atherosclerosis of coronary artery of native heart 04/02/2017   Cardiac pacemaker in situ 01/06/2017   Paroxysmal A-fib (HCC) 03/30/2016   Syncope and collapse 03/11/2016   Right leg weakness 03/11/2016   Essential hypertension 03/11/2016   Hyperglycemia 03/11/2016   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 05/15/2015   Reactive airway disease 03/04/2015   Hypothyroid 12/07/2014   Familial multiple lipoprotein-type hyperlipidemia 12/07/2014   Acute bronchitis 12/07/2014   Recurrent major depressive episodes (HCC) 12/07/2014   Essential (primary) hypertension 12/07/2014   H/O endocrine disorder 12/07/2014   Pre-operative examination 12/07/2014  History of depression 09/22/2012   Fitting or adjustment of cardiac pacemaker 12/02/2011   Chronic gastritis 07/24/1998   Micro Data:   Cultures pending  Antimicrobials:   Anti-infectives (From admission, onward)    Start     Dose/Rate Route Frequency Ordered Stop   04/26/23 1000  vancomycin  (VANCOREADY) IVPB 1250 mg/250 mL  Status:  Discontinued        1,250 mg 166.7 mL/hr over 90 Minutes Intravenous Every 24 hours 04/25/23 1451 04/25/23 1801   04/26/23 1000  vancomycin (VANCOREADY) IVPB 1500 mg/300 mL        1,500 mg 150 mL/hr over 120 Minutes Intravenous Every 24 hours 04/25/23 1801     04/26/23 0800  ceFEPIme (MAXIPIME) 2 g in sodium chloride 0.9 % 100 mL IVPB        2 g 200 mL/hr over 30 Minutes Intravenous Every 8 hours 04/26/23 0700     04/25/23 2200  meropenem (MERREM) 1 g in sodium chloride 0.9 % 100 mL IVPB  Status:  Discontinued        1 g 200 mL/hr over 30 Minutes Intravenous Every 8 hours 04/25/23 1746 04/25/23 1749   04/25/23 1900  ceFEPIme (MAXIPIME) 2 g in sodium chloride 0.9 % 100 mL IVPB  Status:  Discontinued        2 g 200 mL/hr over 30 Minutes Intravenous 2 times daily 04/25/23 1801 04/26/23 0700   04/25/23 1845  chloramphenicol (CHLOROMYCETIN) 1,050 mg in dextrose 5 % 50 mL IVPB  Status:  Discontinued        50 mg/kg/day  83.6 kg 121 mL/hr over 30 Minutes Intravenous Every 6 hours 04/25/23 1746 04/25/23 1749   04/25/23 1100  vancomycin (VANCOREADY) IVPB 2000 mg/400 mL        2,000 mg 200 mL/hr over 120 Minutes Intravenous  Once 04/25/23 1047 04/25/23 1815   04/25/23 1045  metroNIDAZOLE (FLAGYL) IVPB 500 mg  Status:  Discontinued        500 mg 100 mL/hr over 60 Minutes Intravenous  Once 04/25/23 1039 04/25/23 1408   04/25/23 1000  cefTRIAXone (ROCEPHIN) 1 g in sodium chloride 0.9 % 100 mL IVPB        1 g 200 mL/hr over 30 Minutes Intravenous  Once 04/25/23 0955 04/25/23 1044   04/25/23 1000  azithromycin (ZITHROMAX) 500 mg in sodium chloride 0.9 % 250 mL IVPB        500 mg 250 mL/hr over 60 Minutes Intravenous  Once 04/25/23 0955 04/25/23 1317        Significant Hospital Events: Including procedures, antibiotic start and stop dates in addition to other pertinent events   9/1: admitted to ICU for AMS, started on precedex 9/2: remains on precedex  gtt; received 2u prbc  Interim History / Subjective:  Resting comfortably on precedex  Objective   Blood pressure (!) 164/84, pulse 64, temperature 97.7 F (36.5 C), resp. rate 17, height 5\' 6"  (1.676 m), weight 83.6 kg, SpO2 94%.        Intake/Output Summary (Last 24 hours) at 04/26/2023 0856 Last data filed at 04/26/2023 0700 Gross per 24 hour  Intake 3756.48 ml  Output 1665 ml  Net 2091.48 ml   Filed Weights   04/25/23 0944 04/25/23 1500 04/26/23 0500  Weight: 90.7 kg 83.6 kg 83.6 kg    Examination: General: elderly obese female who is difficult to arouse HENT: snoring Lungs: diminished breath sounds in BL bases Cardiovascular: irregularly irregular Abdomen: distended Extremities: no peripheral edema  Neuro: difficult to arouse GU: urinary incontinence  Resolved Hospital Problem list   none  Assessment & Plan:   #AMS - unclear cause - remains on precedex gtt  - NCHCT negative - consider LP once DOAC washout  - on vanc and cefepime for possible infectious cause of AMS; empirically treating for bacterial meningitis - consider LP today as it has been 48 hours since last dose of xarelto - neuro consult placed - unable to get MRI based on PPM - starting acyclovir 9/2  #pAF #Prolonged QTc - given anemia, we will hold xarelto - SCDs for now - monitor on continuous telemetry  #Anemia #History of hemorrhoids #Colonoscopy in 07/2021 (Dr. Timothy Lasso patient) - profound drop in Hgb without occult signs of bleeding - transfused 2u prbc - SCDs for now - H/H showing appropriate bump  #History of Graves - TSH remains high; 5.790 - continue synthroid  #HTN - hold home meds  #HLD - hold home sttain  #COPD - duonebs as needed  #History of COVID  - no evidence of pneumonia on CT  - unlikely cause of continued persistent AMS   Best Practice (right click and "Reselect all SmartList Selections" daily)   Diet/type: NPO DVT prophylaxis: SCD GI prophylaxis:  PPI Lines: N/A Foley:  Yes, and it is still needed Code Status:  full code Last date of multidisciplinary goals of care discussion [husband]  Labs   CBC: Recent Labs  Lab 04/25/23 1000 04/25/23 2228 04/26/23 0328  WBC 11.2*  --  7.9  NEUTROABS 9.2*  --   --   HGB 5.7* 7.9* 8.3*  HCT 19.1* 25.3* 26.2*  MCV 83.8  --  83.2  PLT 411*  --  313    Basic Metabolic Panel: Recent Labs  Lab 04/25/23 1000 04/26/23 0328  NA 142 140  K 3.5 3.5  CL 113* 109  CO2 17* 22  GLUCOSE 114* 153*  BUN 23 17  CREATININE 0.97 0.81  CALCIUM 8.8* 8.3*  MG  --  2.0  PHOS  --  3.8   GFR: Estimated Creatinine Clearance: 66.4 mL/min (by C-G formula based on SCr of 0.81 mg/dL). Recent Labs  Lab 04/25/23 1000 04/25/23 1200 04/26/23 0328  WBC 11.2*  --  7.9  LATICACIDVEN 2.9* 2.3*  --     Liver Function Tests: Recent Labs  Lab 04/25/23 1000  AST 40  ALT 29  ALKPHOS 80  BILITOT 1.0  PROT 6.7  ALBUMIN 3.7   No results for input(s): "LIPASE", "AMYLASE" in the last 168 hours. No results for input(s): "AMMONIA" in the last 168 hours.  ABG No results found for: "PHART", "PCO2ART", "PO2ART", "HCO3", "TCO2", "ACIDBASEDEF", "O2SAT"   Coagulation Profile: No results for input(s): "INR", "PROTIME" in the last 168 hours.  Cardiac Enzymes: No results for input(s): "CKTOTAL", "CKMB", "CKMBINDEX", "TROPONINI" in the last 168 hours.  HbA1C: Hgb A1c MFr Bld  Date/Time Value Ref Range Status  03/11/2016 02:28 PM 6.3 (H) 4.0 - 6.0 % Final    CBG: Recent Labs  Lab 04/25/23 1508  GLUCAP 111*    CT Head Wo Contrast CLINICAL DATA:  Mental status change, unknown cause.  EXAM: CT HEAD WITHOUT CONTRAST  TECHNIQUE: Contiguous axial images were obtained from the base of the skull through the vertex without intravenous contrast.  RADIATION DOSE REDUCTION: This exam was performed according to the departmental dose-optimization program which includes automated exposure control,  adjustment of the mA and/or kV according to patient size and/or use of iterative  reconstruction technique.  COMPARISON:  CT head 03/11/2016.  FINDINGS: Brain: Remote left frontal infarct. No evidence of acute large vascular territory infarct, acute hemorrhage, mass lesion, midline shift or hydrocephalus.  Vascular: No hyperdense vessel identified.  Skull: No acute fracture no evidence.  Sinuses/Orbits: Bilateral maxillary and right sphenoid sinus air-fluid levels with scattered paranasal sinus mucosal thickening. No acute orbital findings.  Other: No mastoid effusions.  IMPRESSION: 1. No evidence of acute intracranial abnormality. 2. Paranasal sinus disease, detailed above.  Electronically Signed   By: Feliberto Harts M.D.   On: 04/25/2023 13:42 CT CHEST ABDOMEN PELVIS W CONTRAST CLINICAL DATA:  COVID 2 weeks ago. Pneumonia. Concern for sepsis. Confusion. Tachypneic.  EXAM: CT CHEST, ABDOMEN, AND PELVIS WITH CONTRAST  TECHNIQUE: Multidetector CT imaging of the chest, abdomen and pelvis was performed following the standard protocol during bolus administration of intravenous contrast.  RADIATION DOSE REDUCTION: This exam was performed according to the departmental dose-optimization program which includes automated exposure control, adjustment of the mA and/or kV according to patient size and/or use of iterative reconstruction technique.  CONTRAST:  OMNIPAQUE IOHEXOL 300 MG/ML  SOLN  COMPARISON:  None Available.  FINDINGS: CT CHEST FINDINGS  CT CHEST FINDINGS  Cardiovascular: No significant vascular findings. Normal heart size. No pericardial effusion.  Mediastinum/Nodes: No axillary or supraclavicular adenopathy. No mediastinal or hilar adenopathy. No pericardial fluid. Esophagus normal.  Lungs/Pleura: No pneumonia. Mild basilar atelectasis. Mild centrilobular emphysema pattern in the upper lobes. There is respiratory motion which does degrade  evaluation of the lungs.  Musculoskeletal: No aggressive osseous lesion.  CT ABDOMEN AND PELVIS FINDINGS  Hepatobiliary: No focal hepatic lesion. No biliary ductal dilatation. Gallbladder is normal. Common bile duct is normal.  Pancreas: Pancreas is normal. No ductal dilatation. No pancreatic inflammation.  Spleen: Normal spleen  Adrenals/urinary tract: Adrenal glands and kidneys are normal. The ureters and bladder normal.  Stomach/Bowel: Stomach, small bowel, appendix, and cecum are normal. The colon and rectosigmoid colon are normal.  Vascular/Lymphatic: Abdominal aorta is normal caliber with atherosclerotic calcification. There is no retroperitoneal or periportal lymphadenopathy. No pelvic lymphadenopathy.  Reproductive: Post hysterectomy.  Adnexa unremarkable  Other: No free fluid.  Musculoskeletal: Severe degenerative change of the lumbar spine. Posterior lumbar fusion at L4-L5. No evidence of bone infection or inflammation.  IMPRESSION: CHEST:  1. No evidence of pneumonia. 2. Mild basilar atelectasis.  PELVIS:  No acute findings in the abdomen pelvis.  Aortic Atherosclerosis (ICD10-I70.0) and Emphysema (ICD10-J43.9).  Electronically Signed   By: Genevive Bi M.D.   On: 04/25/2023 13:24 DG Chest Port 1 View CLINICAL DATA:  Possible sepsis. COVID 2 weeks ago. Pneumonia since last night  EXAM: PORTABLE CHEST 1 VIEW  COMPARISON:  08/31/2022  FINDINGS: Pacer again identified.  Numerous leads and wires project over the chest. Patient rotated right. Midline trachea. Mild cardiomegaly. No right pleural effusion or pneumothorax. The left costophrenic angle is poorly evaluated secondary to overlying soft tissues in this obese patient.  No congestive failure. Suboptimal evaluation of the left lung base secondary to overlying breast tissues. Otherwise, no lobar consolidation.  IMPRESSION: Cardiomegaly, without congestive failure.  Suboptimal  evaluation of the inferior left hemithorax secondary to AP portable technique and patient body habitus. No convincing evidence of acute process. If concern of left lower lobe airspace disease or pleural fluid, recommend PA and lateral radiographs.  Electronically Signed   By: Jeronimo Greaves M.D.   On: 04/25/2023 10:57     Review of Systems:  Unable to obtain as patient is difficult to arouse.    Past Medical History:  She,  has a past medical history of Allergy, Chronic gastritis, Chronic low back pain with sciatica, COPD (chronic obstructive pulmonary disease) (HCC), COVID-19 (04/08/2023), Depression, Diverticulosis, Dysrhythmia, Gastritis, chronic, GERD (gastroesophageal reflux disease), Graves disease, H/O hepatitis, Hallux valgus of right foot, Hammer toe, History of hiatal hernia, History of shingles, Hyperlipidemia, Hypertension, Hypothyroidism, Incontinence, Paroxysmal atrial fibrillation (HCC), Pneumonia (04/24/2023), Presence of permanent cardiac pacemaker, and Stroke (HCC).   Surgical History:   Past Surgical History:  Procedure Laterality Date   APPENDECTOMY     COLONOSCOPY  08/24/1998   COLONOSCOPY WITH PROPOFOL N/A 04/26/2015   Procedure: COLONOSCOPY WITH PROPOFOL;  Surgeon: Wallace Cullens, MD;  Location: [facility] ENDOSCOPY;  Service: Gastroenterology;  Laterality: N/A;   COLONOSCOPY WITH PROPOFOL N/A 07/14/2021   Procedure: COLONOSCOPY WITH PROPOFOL;  Surgeon: Jaynie Collins, DO;  Location: [facility] ENDOSCOPY;  Service: Gastroenterology;  Laterality: N/A;   EYE SURGERY     fibroid tumors     fingers and wrist   FOOT SURGERY     TONSILLECTOMY     TRANSFORAMINAL LUMBAR INTERBODY FUSION (TLIF) WITH PEDICLE SCREW FIXATION 1 LEVEL N/A 06/29/2018   Procedure: TRANSFORAMINAL LUMBAR INTERBODY FUSION (TLIF) WITH PEDICLE SCREW FIXATION 1 LEVEL;  Surgeon: Venetia Night, MD;  Location: [facility] ORS;  Service: Neurosurgery;  Laterality: N/A;   VAGINAL HYSTERECTOMY       Social  History:   reports that she quit smoking about 21 years ago. Her smoking use included cigarettes. She started smoking about 54 years ago. She has a 16.5 pack-year smoking history. She has never used smokeless tobacco. She reports current alcohol use of about 1.0 standard drink of alcohol per week. She reports that she does not use drugs.   Family History:  Her family history includes Dementia in her mother; Diabetes in her sister; Heart disease in her father. There is no history of Breast cancer.   Allergies Allergies  Allergen Reactions   Baclofen Other (See Comments)    Weakness, confusion, tremors.     Penicillins Other (See Comments)    Has patient had a PCN reaction causing immediate rash, facial/tongue/throat swelling, SOB or lightheadedness with hypotension: No Has patient had a PCN reaction causing severe rash involving mucus membranes or skin necrosis: No Has patient had a PCN reaction that required hospitalization No Has patient had a PCN reaction occurring within the last 10 years: No If all of the above answers are "NO", then may proceed with Cephalosporin use.      Home Medications  Prior to Admission medications   Medication Sig Start Date End Date Taking? Authorizing Provider  albuterol (PROVENTIL) (2.5 MG/3ML) 0.083% nebulizer solution Take 3 mLs (2.5 mg total) by nebulization every 6 (six) hours as needed for wheezing or shortness of breath. 02/26/21  Yes Duanne Limerick, MD  chlorpheniramine-HYDROcodone (TUSSIONEX) 10-8 MG/5ML Take 5 mLs by mouth every 12 (twelve) hours as needed for cough. 04/20/23  Yes [provider]  citalopram (CELEXA) 40 MG tablet Take 1 tablet (40 mg total) by mouth daily. 10/28/22  Yes Duanne Limerick, MD  doxycycline (VIBRAMYCIN) 100 MG capsule Take 100 mg by mouth 2 (two) times daily. 04/20/23 04/30/23 Yes [provider]  Fluticasone-Umeclidin-Vilant (TRELEGY ELLIPTA) 100-62.5-25 MCG/ACT AEPB Inhale 1 Inhaler into the lungs daily.    Yes [provider]  gabapentin (NEURONTIN) 300 MG capsule Take 2 capsules (600 mg total) by mouth 3 (  three) times daily. 12/03/22 11/28/23 Yes Venetia Night, MD  gemfibrozil (LOPID) 600 MG tablet Take 1 tablet (600 mg total) by mouth daily. 10/28/22  Yes Duanne Limerick, MD  levothyroxine (SYNTHROID) 112 MCG tablet Take 1 tablet (112 mcg total) by mouth daily. 10/28/22  Yes Duanne Limerick, MD  loratadine (CLARITIN) 10 MG tablet Take 1 tablet (10 mg total) by mouth daily. 10/28/22  Yes Duanne Limerick, MD  metoprolol succinate (TOPROL-XL) 50 MG 24 hr tablet Take 1 tablet (50 mg total) by mouth daily. TAKE WITH OR IMMEDIATELY FOLLOWING A MEAL. 11/25/22  Yes Elizabeth Sauer C, MD  montelukast (SINGULAIR) 10 MG tablet TAKE 1 TABLET BY MOUTH EVERY DAY 10/28/22  Yes Duanne Limerick, MD  Multiple Vitamins-Minerals (CENTRUM SILVER 50+WOMEN PO) Take 1 tablet by mouth daily.   Yes [provider]  nystatin cream (MYCOSTATIN) APPLY TO AFFECTED AREA TWICE A DAY 03/16/22  Yes Duanne Limerick, MD  omeprazole (PRILOSEC) 20 MG capsule Take 20 mg by mouth daily.   Yes [provider]  ondansetron (ZOFRAN) 4 MG tablet Take 1 tablet (4 mg total) by mouth every 8 (eight) hours as needed for nausea or vomiting. 08/28/22  Yes Duanne Limerick, MD  predniSONE (DELTASONE) 10 MG tablet Take 10 mg by mouth daily with breakfast. 04/20/23  Yes [provider]  rivaroxaban (XARELTO) 20 MG TABS tablet Take 20 mg by mouth daily with supper. Cardiologist [facility]   Yes [provider]  simvastatin (ZOCOR) 20 MG tablet Take 1 tablet (20 mg total) by mouth daily. 10/28/22  Yes Duanne Limerick, MD  valsartan (DIOVAN) 160 MG tablet Take 1 tablet (160 mg total) by mouth daily. 11/25/22  Yes Duanne Limerick, MD  pantoprazole (PROTONIX) 40 MG tablet Take 1 tablet (40 mg total) by mouth daily. Patient not taking: Reported on 04/25/2023 08/28/22   Duanne Limerick, MD     Critical care time: 75 minutes    Rhea Bleacher  MD Pulmonary & Critical Care Medicine

## 2023-04-27 ENCOUNTER — Inpatient Hospital Stay: Payer: Medicare Other | Admitting: Radiology

## 2023-04-27 ENCOUNTER — Telehealth: Payer: Self-pay

## 2023-04-27 DIAGNOSIS — R4182 Altered mental status, unspecified: Secondary | ICD-10-CM | POA: Diagnosis not present

## 2023-04-27 DIAGNOSIS — G9341 Metabolic encephalopathy: Secondary | ICD-10-CM

## 2023-04-27 DIAGNOSIS — R509 Fever, unspecified: Secondary | ICD-10-CM

## 2023-04-27 DIAGNOSIS — U071 COVID-19: Secondary | ICD-10-CM | POA: Diagnosis not present

## 2023-04-27 HISTORY — PX: IR LUMBAR PUNCTURE: IMG944

## 2023-04-27 LAB — MENINGITIS/ENCEPHALITIS PANEL (CSF)

## 2023-04-27 LAB — BASIC METABOLIC PANEL
Anion gap: 8 (ref 5–15)
BUN: 17 mg/dL (ref 8–23)
CO2: 22 mmol/L (ref 22–32)
Calcium: 8.2 mg/dL — ABNORMAL LOW (ref 8.9–10.3)
Chloride: 106 mmol/L (ref 98–111)
Creatinine, Ser: 0.81 mg/dL (ref 0.44–1.00)
GFR, Estimated: >60 mL/min (ref >60)
Glucose, Bld: 144 mg/dL — ABNORMAL HIGH (ref 70–99)
Potassium: 3.3 mmol/L — ABNORMAL LOW (ref 3.5–5.1)
Sodium: 136 mmol/L (ref 135–145)

## 2023-04-27 LAB — THYROID PANEL WITH TSH
Free Thyroxine Index: 1.5 (ref 1.2–4.9)
T3 Uptake Ratio: 27 % (ref 24–39)
T4, Total: 5.6 ug/dL (ref 4.5–12.0)
TSH: 9.12 u[IU]/mL — ABNORMAL HIGH (ref 0.450–4.500)

## 2023-04-27 LAB — T4, FREE: Free T4: 0.85 ng/dL (ref 0.61–1.12)

## 2023-04-27 LAB — PHOSPHORUS: Phosphorus: 3.1 mg/dL (ref 2.5–4.6)

## 2023-04-27 LAB — CSF CELL COUNT WITH DIFFERENTIAL
Eosinophils, CSF: 0 %
Eosinophils, CSF: 0 %
Lymphs, CSF: 64 %
Lymphs, CSF: 64 %
Monocyte-Macrophage-Spinal Fluid: 19 %
Monocyte-Macrophage-Spinal Fluid: 30 %
RBC Count, CSF: 312 /mm3 — ABNORMAL HIGH (ref 0–3)
RBC Count, CSF: 400 /mm3 — ABNORMAL HIGH (ref 0–3)
Segmented Neutrophils-CSF: 17 %
Segmented Neutrophils-CSF: 6 %
Tube #: 1
Tube #: 3
WBC, CSF: 176 /mm3 (ref 0–5)
WBC, CSF: 50 /mm3 (ref 0–5)

## 2023-04-27 LAB — GLUCOSE, CAPILLARY: Glucose-Capillary: 146 mg/dL — ABNORMAL HIGH (ref 70–99)

## 2023-04-27 LAB — CBC
HCT: 24.7 % — ABNORMAL LOW (ref 36.0–46.0)
Hemoglobin: 7.9 g/dL — ABNORMAL LOW (ref 12.0–15.0)
MCH: 25.7 pg — ABNORMAL LOW (ref 26.0–34.0)
MCHC: 32 g/dL (ref 30.0–36.0)
MCV: 80.5 fL (ref 80.0–100.0)
Platelets: 266 10*3/uL (ref 150–400)
RBC: 3.07 MIL/uL — ABNORMAL LOW (ref 3.87–5.11)
RDW: 16.8 % — ABNORMAL HIGH (ref 11.5–15.5)
WBC: 7.7 10*3/uL (ref 4.0–10.5)
nRBC: 1.6 % — ABNORMAL HIGH (ref 0.0–0.2)

## 2023-04-27 LAB — VITAMIN B12: Vitamin B-12: 637 pg/mL (ref 180–914)

## 2023-04-27 LAB — VITAMIN D 25 HYDROXY (VIT D DEFICIENCY, FRACTURES): Vit D, 25-Hydroxy: 37.43 ng/mL (ref 30–100)

## 2023-04-27 LAB — IRON AND TIBC
Iron: 16 ug/dL — ABNORMAL LOW (ref 28–170)
Saturation Ratios: 5 % — ABNORMAL LOW (ref 10.4–31.8)
TIBC: 357 ug/dL (ref 250–450)
UIBC: 341 ug/dL

## 2023-04-27 LAB — MAGNESIUM: Magnesium: 1.9 mg/dL (ref 1.7–2.4)

## 2023-04-27 LAB — FOLATE: Folate: 19.4 ng/mL (ref >5.9)

## 2023-04-27 LAB — PROTEIN AND GLUCOSE, CSF
Glucose, CSF: 79 mg/dL — ABNORMAL HIGH (ref 40–70)
Total  Protein, CSF: 41 mg/dL (ref 15–45)

## 2023-04-27 MED ORDER — PANTOPRAZOLE SODIUM 40 MG IV SOLR
40.0000 mg | Freq: Two times a day (BID) | INTRAVENOUS | Status: DC
  Administered 2023-04-27 – 2023-04-30 (×7): 40 mg via INTRAVENOUS
  Filled 2023-04-27 (×7): qty 10

## 2023-04-27 MED ORDER — GABAPENTIN 100 MG PO CAPS
200.0000 mg | ORAL_CAPSULE | Freq: Three times a day (TID) | ORAL | Status: DC
  Administered 2023-04-27 – 2023-04-29 (×7): 200 mg via ORAL
  Filled 2023-04-27 (×7): qty 2

## 2023-04-27 MED ORDER — PROCHLORPERAZINE EDISYLATE 10 MG/2ML IJ SOLN
10.0000 mg | Freq: Once | INTRAMUSCULAR | Status: AC
  Administered 2023-04-27: 10 mg via INTRAVENOUS
  Filled 2023-04-27: qty 2

## 2023-04-27 MED ORDER — POTASSIUM CHLORIDE 10 MEQ/100ML IV SOLN
10.0000 meq | INTRAVENOUS | Status: DC
  Administered 2023-04-27: 10 meq via INTRAVENOUS
  Filled 2023-04-27 (×2): qty 100

## 2023-04-27 MED ORDER — POTASSIUM CHLORIDE 20 MEQ PO PACK
40.0000 meq | PACK | Freq: Once | ORAL | Status: AC
  Administered 2023-04-27: 40 meq via ORAL
  Filled 2023-04-27: qty 2

## 2023-04-27 MED ORDER — CITALOPRAM HYDROBROMIDE 20 MG PO TABS
40.0000 mg | ORAL_TABLET | Freq: Every day | ORAL | Status: DC
  Administered 2023-04-27 – 2023-04-30 (×4): 40 mg via ORAL
  Filled 2023-04-27 (×4): qty 2

## 2023-04-27 MED ORDER — LEVOTHYROXINE SODIUM 112 MCG PO TABS
112.0000 ug | ORAL_TABLET | Freq: Every day | ORAL | Status: DC
  Administered 2023-04-28 – 2023-04-30 (×3): 112 ug via ORAL
  Filled 2023-04-27 (×3): qty 1

## 2023-04-27 MED ORDER — LIDOCAINE HCL 1 % IJ SOLN
INTRAMUSCULAR | Status: AC
  Filled 2023-04-27: qty 20

## 2023-04-27 MED ORDER — MELATONIN 5 MG PO TABS
5.0000 mg | ORAL_TABLET | Freq: Every day | ORAL | Status: DC
  Administered 2023-04-27 – 2023-04-28 (×2): 5 mg via ORAL
  Filled 2023-04-27 (×3): qty 1

## 2023-04-27 NOTE — Progress Notes (Signed)
Critical results for CSF labs received. Notified Dr. Larinda Buttery of results.

## 2023-04-27 NOTE — Progress Notes (Signed)
HPI  This is a case of a 77 year old female patient with past medical history of COPD, depression, hypothyroidism, paroxysmal A-fib on Xarelto, transient complete heart block status post Medtronic Revo MRI dual-chamber pacemaker GERD hyperlipidemia and spondylolisthesis of lumbar region status post lumbar fusion  in 2020 at [facility] presenting to [facility] on 09/01 with altered mental status, agitated admitted to ICU on Precedex drip.  Per her husband was at bedside she was last known normal on Friday 08/30 she woke up Saturday feeling drowsy combative and having urinary incontinence and mentally not clear and therefore they decided to bring her to the emergency department.  Prior to that about a week ago she was being treated for COPD exacerbation with tetracycline and prednisone as outpatient.  In the ED she was found to have a low-grade fever up to 100.4 with significantly altered mental status and therefore this raises suspicion for meningitis for which she was started on antibiotic therapy with ceftriaxone vancomycin acyclovir and dexamethasone.  Her CT chest was clear.  Her UA did not show any sign of infection.  This morning per husband he he feels she is much better mental status was better.  Physical exam GEN no acute distress alert and oriented HEENT supple neck, reactive pupils CVS normal S1, normal S2, regular rate and rhythm Lungs clear bilateral air entry Abdomen soft nontender nondistended positive bowel sound Extremities warm well-perfused no edema  Labs and imaging were reviewed.  Imaging  CT head without contrast on 09/1 without any evidence of intracranial abnormality  CT abdomen and pelvis and chest 09/1 without any evidence of pneumonia nor intraabdominal process.   Assessment and plan This is a case of a 77 year old female patient with past medical history of COPD, depression, hypothyroidism, paroxysmal A-fib on Xarelto,  transient complete heart block status post Medtronic Revo MRI dual-chamber pacemaker GERD hyperlipidemia and spondylolisthesis of lumbar region status post lumbar fusion  in 2020 at [facility] presenting to [facility] on 09/01 with altered mental status, agitated admitted to ICU on Precedex drip.  #Acute encephalopathy with unclear reason.  Empirically managed for meningitis.  CT head without any intracranial acute process.  CT chest without any signs of pneumonia and UA without any signs of infectious process.  Pending LP. #History of COPD on triple therapy #History of paroxysmal A-fib, on Xarelto on hold. #Anemia on presentation with hemoglobin 5.7 with good response to transfusion.  Unclear if underlying GI bleed will consider GI consult once mental status improves. #History of complete heart block status post dual-chamber pacemaker placement follows at [facility]. #History of spondylolisthesis of lumbar region status post lumbar fusion in 2020 complicated by urinary incontinence and peripheral neuropathy on gabapentin #Depression on Celexa  #GERD   Neuro/ID: Pending LP. Precedx for agitation, currently weaning down. C/w Citalopram and reduced dose gapapentin. Continue with empirc coverage for meningitis. Appreciate Neuro recs. Appreciate ID recs.  CVS: Holding Xarelto pending LP. Holding Toprol since admission, will consider restarting in the am.  Lungs: Will start duonebs scheduled in the am if cant tolerate inhaler. Goal Spo2 89-92  GI: On IV PPI 40mg  BID for anemia presentation. No signs of bleeding. Will consider GI consult pending Hgb trend. Diet post LP.  Endo: Glucose at baseline  Heme: Will start DVT prophylaxis vs full ac post lp and if hgb remains stable. Currently in SCDs.   F - LR 50cc/hr  E - As needed  N -  Currently NPO can have diet post LP (passed bedside swallow today)  P - SCDs IV PPI BID  C - Full code   Dispo - If remains off  precedex can downgrade in the am  Janann Colonel, MD

## 2023-04-27 NOTE — Progress Notes (Signed)
PHARMACY CONSULT NOTE - FOLLOW UP  Pharmacy Consult for Electrolyte Monitoring and Replacement   Recent Labs: Potassium (mmol/L)  Date Value  04/27/2023 3.3 (L)   Magnesium (mg/dL)  Date Value  16/05/9603 1.9   Calcium (mg/dL)  Date Value  54/04/8118 8.2 (L)   Albumin (g/dL)  Date Value  14/78/2956 3.7  10/28/2022 3.9   Phosphorus (mg/dL)  Date Value  21/30/8657 3.8   Sodium (mmol/L)  Date Value  04/27/2023 136  10/28/2022 143     Assessment: 77 y.o. female admitted on 04/25/2023 with sepsis. PMH significant for familial HLD, HTN, depression, obesity, COPD, AF. Pharmacy is asked to follow and replace electrolytes while in CCU  Goal of Therapy:  Electrolytes WNL  Plan:  --40 mEq po KCl x 1 --recheck electrolytes in am  Lowella Bandy ,PharmD Clinical Pharmacist 04/27/2023 7:26 AM

## 2023-04-27 NOTE — Telephone Encounter (Signed)
Pt admitted to ICU

## 2023-04-27 NOTE — Consult Note (Addendum)
[facility]  Total days of antibiotics 2 Reason for Consult:fver and ams   Referring Physician: assaker  Principal Problem:   Altered mental status    HPI: Danielle Taylor is a 77 y.o. female with history of COPD, chronic gastritis, chronic lower back pain, hypertension hyperlipidemia, paroxysmal atrial fibrillation on Xarelto, COVID infection in the last month. She still was feeling poorly and saw her PCP 1 week prior to admit where she was started on prednisone and doxycycline. She reported some abdominal pain however started to have acute altered mental status, plus new onset fever. She was admitted for evaluation of her presentation due to delirium, by vital signs stable. Had mild lactic acidosis of 2.9. wbc of 11.4 but hemoglobin of 5.7 concerning for GI bleed. Chest abd pelvis CT non revealing. She was started on empiric coverage for meningitis with vancomycin, ceftriaxone, bactrim and acyclovir. Today is day 3 of iv abtx. Wbc down to 7.7 and tmax yesterday 100.4.   Reportedly had a minor fall last week and head strike, per husband. Lives at home with her husband. Does not necessarily eat sandwich meats.   Past Medical History:  Diagnosis Date   Allergy    Chronic gastritis    Chronic low back pain with sciatica    COPD (chronic obstructive pulmonary disease) (HCC)    COVID-19 04/08/2023   Depression    Diverticulosis    Dysrhythmia    Complete Heart Block   Gastritis, chronic    GERD (gastroesophageal reflux disease)    Graves disease    H/O hepatitis    Hallux valgus of right foot    Hammer toe    History of hiatal hernia    History of shingles    Hyperlipidemia    Hypertension    Hypothyroidism    Incontinence    bladder and bowel   Paroxysmal atrial fibrillation (HCC)    Pneumonia 04/24/2023   Presence of permanent cardiac pacemaker    2012   Stroke [facility])     Allergies:  Allergies  Allergen Reactions   Baclofen Other (See  Comments)    Weakness, confusion, tremors.     Penicillins Other (See Comments)    Has patient had a PCN reaction causing immediate rash, facial/tongue/throat swelling, SOB or lightheadedness with hypotension: No Has patient had a PCN reaction causing severe rash involving mucus membranes or skin necrosis: No Has patient had a PCN reaction that required hospitalization No Has patient had a PCN reaction occurring within the last 10 years: No If all of the above answers are "NO", then may proceed with Cephalosporin use.      MEDICATIONS:  Chlorhexidine Gluconate Cloth  6 each Topical Q0600   citalopram  40 mg Oral Daily   dexamethasone (DECADRON) injection  10 mg Intravenous Q6H   gabapentin  200 mg Oral TID   Ipratropium-Albuterol  1 puff Inhalation Q4H   [START ON 04/28/2023] levothyroxine  112 mcg Oral Q0600   pantoprazole (PROTONIX) IV  40 mg Intravenous Q12H    Social History   Tobacco Use   Smoking status: Former    Current packs/day: 0.00    Average packs/day: 0.5 packs/day for 33.0 years (16.5 ttl pk-yrs)    Types: Cigarettes    Start date: 52    Quit date: 2003    Years since quitting: 21.6   Smokeless tobacco: Never   Tobacco comments:    smoking cessation materials not required  Vaping Use  Vaping status: Never Used  Substance Use Topics   Alcohol use: Yes    Alcohol/week: 1.0 standard drink of alcohol    Types: 1 Glasses of wine per week    Comment: occassional   Drug use: Never    Family History  Problem Relation Age of Onset   Dementia Mother    Heart disease Father    Diabetes Sister    Breast cancer Neg Hx     Review of Systems - unable to obtain due to encephalopathy   OBJECTIVE: Temp:  [97.2 F (36.2 C)-100.4 F (38 C)] 98.4 F (36.9 C) (09/03 1140) Pulse Rate:  [50-102] 71 (09/03 1400) Resp:  [14-36] 20 (09/03 1400) BP: (86-151)/(47-83) 86/47 (09/03 1400) SpO2:  [89 %-100 %] 100 % (09/03 1400) Physical Exam  Constitutional:  sleepy.  appears well-developed and well-nourished. No distress.  HENT: [place]/AT, PERRLA, no scleral icterus Mouth/Throat: Oropharynx is clear and moist. No oropharyngeal exudate.  Cardiovascular: Normal rate, regular rhythm and normal heart sounds. Exam reveals no gallop and no friction rub.  No murmur heard.  Pulmonary/Chest: Effort normal and breath sounds normal. No respiratory distress.  has no wheezes.  Neck = supple, no nuchal rigidity Abdominal: Soft. Bowel sounds are normal.  exhibits no distension. There is no tenderness.  Skin: Skin is warm and dry. No rash noted. No erythema.    LABS: Results for orders placed or performed during the hospital encounter of 04/25/23 (from the past 48 hour(s))  Glucose, capillary     Status: Abnormal   Collection Time: 04/25/23  3:08 PM  Result Value Ref Range   Glucose-Capillary 111 (H) 70 - 99 mg/dL    Comment: Glucose reference range applies only to samples taken after fasting for at least 8 hours.  Urinalysis, w/ Reflex to Culture (Infection Suspected) -Urine, Catheterized     Status: Abnormal   Collection Time: 04/25/23  3:18 PM  Result Value Ref Range   Specimen Source URINE, CATHETERIZED    Color, Urine YELLOW YELLOW   APPearance CLEAR (A) CLEAR   Specific Gravity, Urine 1.015 1.005 - 1.030   pH 5.5 5.0 - 8.0   Glucose, UA NEGATIVE NEGATIVE mg/dL   Hgb urine dipstick NEGATIVE NEGATIVE   Bilirubin Urine NEGATIVE NEGATIVE   Ketones, ur 15 (A) NEGATIVE mg/dL   Protein, ur NEGATIVE NEGATIVE mg/dL   Nitrite NEGATIVE NEGATIVE   Leukocytes,Ua NEGATIVE NEGATIVE   RBC / HPF 0-5 0 - 5 RBC/hpf   WBC, UA 0-5 0 - 5 WBC/hpf    Comment:        Reflex urine culture not performed if WBC <=10, OR if Squamous epithelial cells >5. If Squamous epithelial cells >5 suggest recollection.    Bacteria, UA NONE SEEN NONE SEEN   Squamous Epithelial / HPF 0-5 0 - 5 /HPF   Mucus PRESENT     Comment: Performed at [facility], [address]  [place], [place] [address]  MRSA Next Gen by PCR, Nasal     Status: None   Collection Time: 04/25/23  3:18 PM   Specimen: Nasal Mucosa; Nasal Swab  Result Value Ref Range   MRSA by PCR Next Gen NOT DETECTED NOT DETECTED    Comment: (NOTE) The GeneXpert MRSA Assay (FDA approved for NASAL specimens only), is one component of a comprehensive MRSA colonization surveillance program. It is not intended to diagnose MRSA infection nor to guide or monitor treatment for MRSA infections. Test performance is not FDA approved in patients  less than 88 years old. Performed at [facility], [address] [place], [place] [address]   SARS Coronavirus 2 by RT PCR (hospital order, performed in [facility] hospital lab) *cepheid single result test* Anterior Nasal Swab     Status: Abnormal   Collection Time: 04/25/23  3:18 PM   Specimen: Anterior Nasal Swab  Result Value Ref Range   SARS Coronavirus 2 by RT PCR POSITIVE (A) NEGATIVE    Comment: (NOTE) SARS-CoV-2 target nucleic acids are DETECTED  SARS-CoV-2 RNA is generally detectable in upper respiratory specimens  during the acute phase of infection.  Positive results are indicative  of the presence of the identified virus, but do not rule out bacterial infection or co-infection with other pathogens not detected by the test.  Clinical correlation with patient history and  other diagnostic information is necessary to determine patient infection status.  The expected result is negative.  Fact Sheet for Patients:   RoadLapTop.co.za   Fact Sheet for Healthcare Providers:   http://kim-miller.com/    This test is not yet approved or cleared by the [place] FDA and  has been authorized for detection and/or diagnosis of SARS-CoV-2 by FDA under an Emergency Use Authorization (EUA).  This EUA will remain in effect (meaning this test can be used) for the duration of  the COVID-19 declaration under  Section 564(b)(1)  of the Act, 21 U.S.C. section 360-bbb-3(b)(1), unless the authorization is terminated or revoked sooner.   Performed at [facility], [address] [place], [place] [address]   Hemoglobin and hematocrit, blood     Status: Abnormal   Collection Time: 04/25/23 10:28 PM  Result Value Ref Range   Hemoglobin 7.9 (L) 12.0 - 15.0 g/dL    Comment: REPEATED TO VERIFY   HCT 25.3 (L) 36.0 - 46.0 %    Comment: Performed at [facility], [address] [place], [place] [address]  CBC     Status: Abnormal   Collection Time: 04/26/23  3:28 AM  Result Value Ref Range   WBC 7.9 4.0 - 10.5 K/uL   RBC 3.15 (L) 3.87 - 5.11 MIL/uL   Hemoglobin 8.3 (L) 12.0 - 15.0 g/dL   HCT 91.4 (L) 78.2 - 95.6 %   MCV 83.2 80.0 - 100.0 fL   MCH 26.3 26.0 - 34.0 pg   MCHC 31.7 30.0 - 36.0 g/dL   RDW 21.3 (H) 08.6 - 57.8 %   Platelets 313 150 - 400 K/uL   nRBC 1.7 (H) 0.0 - 0.2 %    Comment: Performed at [facility], [address], [place], [place] [address]  Basic metabolic panel     Status: Abnormal   Collection Time: 04/26/23  3:28 AM  Result Value Ref Range   Sodium 140 135 - 145 mmol/L   Potassium 3.5 3.5 - 5.1 mmol/L   Chloride 109 98 - 111 mmol/L   CO2 22 22 - 32 mmol/L   Glucose, Bld 153 (H) 70 - 99 mg/dL    Comment: Glucose reference range applies only to samples taken after fasting for at least 8 hours.   BUN 17 8 - 23 mg/dL   Creatinine, Ser 9.52 0.44 - 1.00 mg/dL   Calcium 8.3 (L) 8.9 - 10.3 mg/dL   GFR, Estimated >84 >13 mL/min    Comment: (NOTE) Calculated using the CKD-EPI Creatinine Equation (2021)    Anion gap 9 5 - 15    Comment: Performed at [facility],  [address] [place], [place] [address]  Magnesium     Status: None   Collection Time: 04/26/23  3:28 AM  Result Value Ref Range   Magnesium 2.0 1.7 - 2.4 mg/dL    Comment: Performed at [facility], [address] [place], [place] [address]  Phosphorus      Status: None   Collection Time: 04/26/23  3:28 AM  Result Value Ref Range   Phosphorus 3.8 2.5 - 4.6 mg/dL    Comment: Performed at [facility], [address] [place], [place] [address]  Basic metabolic panel     Status: Abnormal   Collection Time: 04/27/23  4:44 AM  Result Value Ref Range   Sodium 136 135 - 145 mmol/L   Potassium 3.3 (L) 3.5 - 5.1 mmol/L   Chloride 106 98 - 111 mmol/L   CO2 22 22 - 32 mmol/L   Glucose, Bld 144 (H) 70 - 99 mg/dL    Comment: Glucose reference range applies only to samples taken after fasting for at least 8 hours.   BUN 17 8 - 23 mg/dL   Creatinine, Ser 5.28 0.44 - 1.00 mg/dL   Calcium 8.2 (L) 8.9 - 10.3 mg/dL   GFR, Estimated >41 >32 mL/min    Comment: (NOTE) Calculated using the CKD-EPI Creatinine Equation (2021)    Anion gap 8 5 - 15    Comment: Performed at [facility], [address] [place], [place] [address]  Magnesium     Status: None   Collection Time: 04/27/23  4:44 AM  Result Value Ref Range   Magnesium 1.9 1.7 - 2.4 mg/dL    Comment: Performed at [facility], [address] [place], [place] [address]  CBC     Status: Abnormal   Collection Time: 04/27/23  4:44 AM  Result Value Ref Range   WBC 7.7 4.0 - 10.5 K/uL   RBC 3.07 (L) 3.87 - 5.11 MIL/uL   Hemoglobin 7.9 (L) 12.0 - 15.0 g/dL   HCT 66.4 (L) 40.3 - 47.4 %   MCV 80.5 80.0 - 100.0 fL   MCH 25.7 (L) 26.0 - 34.0 pg   MCHC 32.0 30.0 - 36.0 g/dL   RDW 25.9 (H) 56.3 - 87.5 %   Platelets 266 150 - 400 K/uL   nRBC 1.6 (H) 0.0 - 0.2 %    Comment: Performed at [facility], [address], [place], [place] [address]  Phosphorus     Status: None   Collection Time: 04/27/23  4:44 AM  Result Value Ref Range   Phosphorus 3.1 2.5 - 4.6 mg/dL    Comment: Performed at [facility], [address] [place], [place] [address]  T4, free     Status: None   Collection Time: 04/27/23  4:44 AM  Result Value Ref Range   Free T4 0.85 0.61  - 1.12 ng/dL    Comment: (NOTE) Biotin ingestion may interfere with free T4 tests. If the results are inconsistent with the TSH level, previous test results, or the clinical presentation, then consider biotin interference. If needed, order repeat testing after stopping biotin. Performed at [facility], [address] [place], [place] [address]   Iron and TIBC     Status: Abnormal   Collection Time: 04/27/23  4:44 AM  Result Value Ref Range   Iron 16 (L) 28 - 170 ug/dL   TIBC 630 160 - 109 ug/dL   Saturation Ratios 5 (L) 10.4 -  31.8 %   UIBC 341 ug/dL    Comment: Performed at [facility], [address] [place], [place] [address]  Folate     Status: None   Collection Time: 04/27/23  4:44 AM  Result Value Ref Range   Folate 19.4 >5.9 ng/mL    Comment: Performed at [facility], [address] [place], [place] [address]  Glucose, capillary     Status: Abnormal   Collection Time: 04/27/23  7:27 AM  Result Value Ref Range   Glucose-Capillary 146 (H) 70 - 99 mg/dL    Comment: Glucose reference range applies only to samples taken after fasting for at least 8 hours.    MICRO: Blood cx negative Covid PCR + (Ct value of 38 -- non-infectious range) IMAGING: No results found.   Assessment/Plan:  77yo F feeling poorly since having covid-19 in the last 2-3 weeks with sequelae of malaise and cough but presented to the ED with 2 day history of fever and encephalopathy. Had improved with broad spectrum abtx.  - awaiting LP, cell count. I don't have strong suspicion for listeria, or bacterial meningitis, but could be viral/aseptic meningitis - please send CSF for meningitis PCR panel - will discontinue bactrim now - await CSF results for further de-escalation of abtx.  Duke Salvia Drue Second MD MPH [facility] [phone]

## 2023-04-27 NOTE — Telephone Encounter (Signed)
Appt 05/10/23 Requested Prescriptions  Pending Prescriptions Disp Refills   gemfibrozil (LOPID) 600 MG tablet [Pharmacy Med Name: GEMFIBROZIL 600 MG TABLET] 90 tablet 1    Sig: TAKE 1 TABLET BY MOUTH EVERY DAY     Cardiovascular:  Antilipid - Fibric Acid Derivatives Failed - 04/24/2023  8:31 AM      Failed - PLT in normal range and within 360 days    Platelets  Date Value Ref Range Status  04/27/2023 266 150 - 400 K/uL Final  08/28/2022 432 150 - 450 x10E3/uL Final         Failed - WBC in normal range and within 360 days    WBC  Date Value Ref Range Status  04/27/2023 7.7 4.0 - 10.5 K/uL Final         Failed - Lipid Panel in normal range within the last 12 months    Cholesterol, Total  Date Value Ref Range Status  10/28/2022 152 100 - 199 mg/dL Final   LDL Chol Calc (NIH)  Date Value Ref Range Status  10/28/2022 86 0 - 99 mg/dL Final   HDL  Date Value Ref Range Status  10/28/2022 50 >39 mg/dL Final   Triglycerides  Date Value Ref Range Status  10/28/2022 87 0 - 149 mg/dL Final         Passed - ALT in normal range and within 360 days    ALT  Date Value Ref Range Status  04/25/2023 29 0 - 44 U/L Final         Passed - AST in normal range and within 360 days    AST  Date Value Ref Range Status  04/25/2023 40 15 - 41 U/L Final         Passed - Cr in normal range and within 360 days    Creatinine, Ser  Date Value Ref Range Status  04/27/2023 0.81 0.44 - 1.00 mg/dL Final         Passed - HGB in normal range and within 360 days    Hemoglobin  Date Value Ref Range Status  04/27/2023 7.9 (L) 12.0 - 15.0 g/dL Final  16/05/9603 54.0 11.1 - 15.9 g/dL Final         Passed - HCT in normal range and within 360 days    HCT  Date Value Ref Range Status  04/27/2023 24.7 (L) 36.0 - 46.0 % Final   Hematocrit  Date Value Ref Range Status  08/28/2022 38.3 34.0 - 46.6 % Final         Passed - eGFR is 30 or above and within 360 days    GFR calc Af Amer  Date Value  Ref Range Status  09/25/2020 74 >59 mL/min/1.73 Final    Comment:    **In accordance with recommendations from the NKF-ASN Task force,**   [facility] is in the process of updating its eGFR calculation to the   2021 CKD-EPI creatinine equation that estimates kidney function   without a race variable.    GFR, Estimated  Date Value Ref Range Status  04/27/2023 >60 >60 mL/min Final    Comment:    (NOTE) Calculated using the CKD-EPI Creatinine Equation (2021)    eGFR  Date Value Ref Range Status  10/28/2022 82 >59 mL/min/1.73 Final         Passed - Valid encounter within last 12 months    Recent Outpatient Visits           3 weeks ago COVID  [facility]   5 months ago Essential (primary) hypertension   [facility]   6 months ago Depression, unspecified depression type   [facility]   7 months ago Elevated transaminase level   [facility]   7 months ago Abdominal pain, bilateral upper quadrant   [facility]       Future Appointments             In 1 week Duanne Limerick, MD [facility]             citalopram (CELEXA) 40 MG tablet [Pharmacy Med Name: CITALOPRAM HBR 40 MG TABLET] 90 tablet 0    Sig: TAKE 1 TABLET BY MOUTH EVERY DAY     Psychiatry:  Antidepressants - SSRI Passed - 04/24/2023  8:31 AM      Passed - Completed PHQ-2 or PHQ-9 in the last 360 days      Passed - Valid encounter within last 6 months    Recent Outpatient Visits           3 weeks ago COVID   [facility]   5 months ago  Essential (primary) hypertension   [place] [facility]   6 months ago Depression, unspecified depression type   [facility]   7 months ago Elevated transaminase level   [facility]   7 months ago Abdominal pain, bilateral upper quadrant   [facility]       Future Appointments             In 1 week Duanne Limerick, MD [facility] at [facility], [facility]

## 2023-04-27 NOTE — Plan of Care (Signed)
  Problem: Education: Goal: Knowledge of General Education information will improve Description Including pain rating scale, medication(s)/side effects and non-pharmacologic comfort measures Outcome: Progressing   Problem: Health Behavior/Discharge Planning: Goal: Ability to manage health-related needs will improve Outcome: Progressing   Problem: Clinical Measurements: Goal: Ability to maintain clinical measurements within normal limits will improve Outcome: Progressing Goal: Diagnostic test results will improve Outcome: Progressing Goal: Respiratory complications will improve Outcome: Progressing Goal: Cardiovascular complication will be avoided Outcome: Progressing   Problem: Activity: Goal: Risk for activity intolerance will decrease Outcome: Progressing   Problem: Nutrition: Goal: Adequate nutrition will be maintained Outcome: Progressing   Problem: Coping: Goal: Level of anxiety will decrease Outcome: Progressing   Problem: Pain Managment: Goal: General experience of comfort will improve Outcome: Progressing   Problem: Safety: Goal: Ability to remain free from injury will improve Outcome: Progressing   Problem: Skin Integrity: Goal: Risk for impaired skin integrity will decrease Outcome: Progressing

## 2023-04-27 NOTE — Plan of Care (Signed)
Patient is still under ICU/PCCM care TRH will sign off for now.  Please reconsult when patient is stable to downgrade under [facility] service. Thank you

## 2023-04-28 DIAGNOSIS — G9341 Metabolic encephalopathy: Secondary | ICD-10-CM | POA: Diagnosis not present

## 2023-04-28 DIAGNOSIS — A419 Sepsis, unspecified organism: Secondary | ICD-10-CM | POA: Diagnosis not present

## 2023-04-28 DIAGNOSIS — D649 Anemia, unspecified: Secondary | ICD-10-CM | POA: Diagnosis not present

## 2023-04-28 DIAGNOSIS — G030 Nonpyogenic meningitis: Secondary | ICD-10-CM | POA: Diagnosis not present

## 2023-04-28 DIAGNOSIS — R4182 Altered mental status, unspecified: Secondary | ICD-10-CM | POA: Diagnosis not present

## 2023-04-28 LAB — CBC
HCT: 23.7 % — ABNORMAL LOW (ref 36.0–46.0)
Hemoglobin: 7.6 g/dL — ABNORMAL LOW (ref 12.0–15.0)
MCH: 25.9 pg — ABNORMAL LOW (ref 26.0–34.0)
MCHC: 32.1 g/dL (ref 30.0–36.0)
MCV: 80.6 fL (ref 80.0–100.0)
Platelets: 215 10*3/uL (ref 150–400)
RBC: 2.94 MIL/uL — ABNORMAL LOW (ref 3.87–5.11)
RDW: 17 % — ABNORMAL HIGH (ref 11.5–15.5)
WBC: 5.1 10*3/uL (ref 4.0–10.5)
nRBC: 1.8 % — ABNORMAL HIGH (ref 0.0–0.2)

## 2023-04-28 LAB — BASIC METABOLIC PANEL
Anion gap: 10 (ref 5–15)
BUN: 18 mg/dL (ref 8–23)
CO2: 20 mmol/L — ABNORMAL LOW (ref 22–32)
Calcium: 8.3 mg/dL — ABNORMAL LOW (ref 8.9–10.3)
Chloride: 105 mmol/L (ref 98–111)
Creatinine, Ser: 0.84 mg/dL (ref 0.44–1.00)
GFR, Estimated: >60 mL/min (ref >60)
Glucose, Bld: 159 mg/dL — ABNORMAL HIGH (ref 70–99)
Potassium: 3.5 mmol/L (ref 3.5–5.1)
Sodium: 135 mmol/L (ref 135–145)

## 2023-04-28 LAB — PHOSPHORUS: Phosphorus: 3.5 mg/dL (ref 2.5–4.6)

## 2023-04-28 LAB — MAGNESIUM: Magnesium: 2.1 mg/dL (ref 1.7–2.4)

## 2023-04-28 MED ORDER — ENOXAPARIN SODIUM 40 MG/0.4ML IJ SOSY
40.0000 mg | PREFILLED_SYRINGE | INTRAMUSCULAR | Status: DC
  Administered 2023-04-29: 40 mg via SUBCUTANEOUS
  Filled 2023-04-28: qty 0.4

## 2023-04-28 MED ORDER — LACTATED RINGERS IV BOLUS
500.0000 mL | Freq: Once | INTRAVENOUS | Status: AC
  Administered 2023-04-28: 500 mL via INTRAVENOUS

## 2023-04-28 MED ORDER — SODIUM CHLORIDE 0.9 % IV BOLUS: 500.0000 mL | Freq: Once | INTRAVENOUS | Status: DC

## 2023-04-28 MED ORDER — ACETAMINOPHEN 325 MG PO TABS
650.0000 mg | ORAL_TABLET | Freq: Four times a day (QID) | ORAL | Status: DC | PRN
  Administered 2023-04-28 – 2023-04-29 (×4): 650 mg via ORAL
  Filled 2023-04-28 (×4): qty 2

## 2023-04-28 NOTE — Progress Notes (Signed)
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  Per her husband was at bedside she was last known normal on Friday 08/30 she woke up Saturday feeling drowsy combative and having urinary incontinence and mentally not clear and therefore they decided to bring her to the emergency department.  Prior to that about a week ago she was being treated for COPD exacerbation with tetracycline and prednisone as outpatient.  In the ED she was found to have a low-grade fever up to 100.4 with significantly altered mental status and therefore this raises suspicion for meningitis for which she was started on antibiotic therapy with ceftriaxone vancomycin acyclovir and dexamethasone.  Her CT chest was clear.  Her UA did not show any sign of infection.  S/p LP 09/03.   Appears much better this morning. Alert oriented x4. Has no major complaints.   Physical exam GEN no acute distress alert and oriented HEENT supple neck, reactive pupils CVS normal S1, normal S2, regular rate and rhythm Lungs clear bilateral air entry Abdomen soft nontender nondistended positive bowel sound Extremities warm well-perfused no edema  Labs and imaging were reviewed.  Imaging  CT head without contrast on 09/1 without any evidence of intracranial abnormality  CT abdomen and pelvis and chest 09/1 without any evidence of pneumonia nor intraabdominal process.   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  #Acute encephalopathy with unclear reason.  Empirically managed for meningitis.  CT head without any intracranial acute process.  CT chest without any signs of pneumonia and UA without any signs of infectious process.  CSF studies shows aseptic meningitis, Viral PCR including HSV negative. No signs of bacterial meningitis.  #History of COPD on triple therapy #History of paroxysmal A-fib, on Xarelto on hold. #Anemia on presentation with hemoglobin 5.7 with good response to transfusion.  Unclear if underlying GI bleed will consider GI consult once mental status improves. #History of complete heart block status post dual-chamber pacemaker placement follows at [facility]. #History of spondylolisthesis of lumbar region status post lumbar fusion in 2020 complicated by urinary incontinence and peripheral neuropathy on gabapentin #Depression on Celexa  #GERD   Neuro/ID: D/c Precedx. C/w Citalopram and reduced dose gapapentin. discontinue empirc coverage for meningitis. Appreciate Neuro recs. Appreciate ID recs.  CVS: Keep Holding Xarelto as patient hgb is slightly downtrending (can resume if stable) Holding Toprol since admission, will consider restarting in the am.  Lungs: Continue with Combivent and resume home inhaler upon discharge. Goal Spo2 89-92  GI: On IV PPI 40mg  BID for anemia presentation. No signs of bleeding. Will consider GI consult pending Hgb trend. Regular Diet.  Endo: Glucose at baseline  Heme: Will start DVT prophylaxis vs full ac post lp and if hgb remains stable.  Currently on SCDs.   F - PO E - As needed  N - Regular diet P - Lovenox  40mg  subcutaneous for dvt C - Full code   Dispo - Transfer to hospitalist service (intercare)    Janann Colonel, MD

## 2023-04-28 NOTE — Progress Notes (Signed)
[facility]    Date of Admission:  04/25/2023   Total days of antibiotics 4   ID: Danielle Taylor is a 77 y.o. female with   Principal Problem:   Altered mental status Active Problems:   Acute metabolic encephalopathy    Subjective: Afebrile, alert, mild neck pain  Medications:   Chlorhexidine Gluconate Cloth  6 each Topical Q0600   citalopram  40 mg Oral Daily   gabapentin  200 mg Oral TID   Ipratropium-Albuterol  1 puff Inhalation Q4H   levothyroxine  112 mcg Oral Q0600   melatonin  5 mg Oral QHS   pantoprazole (PROTONIX) IV  40 mg Intravenous Q12H    Objective: Vital signs in last 24 hours: Temp:  [97.5 F (36.4 C)-98.5 F (36.9 C)] 97.8 F (36.6 C) (09/04 1200) Pulse Rate:  [45-90] 88 (09/04 1400) Resp:  [12-34] 27 (09/04 1400) BP: (94-149)/(48-84) 149/63 (09/04 1400) SpO2:  [85 %-100 %] 85 % (09/04 1400) Weight:  [83.6 kg] 83.6 kg (09/04 0500)  Physical Exam  Constitutional:  oriented to person, place, and time. appears well-developed and well-nourished. No distress.  HENT: [place]/AT, PERRLA, no scleral icterus Mouth/Throat: Oropharynx is clear and moist. No oropharyngeal exudate.  Cardiovascular: Normal rate, regular rhythm and normal heart sounds. Exam reveals no gallop and no friction rub.  No murmur heard.  Pulmonary/Chest: Effort normal and breath sounds normal. No respiratory distress.  has no wheezes.  Neck = supple, no nuchal rigidity Abdominal: Soft. Bowel sounds are normal.  exhibits no distension. There is no tenderness.  Lymphadenopathy: no cervical adenopathy. No axillary adenopathy Neurological: alert and oriented to person, place, and time.  Skin: Skin is warm and dry. No rash noted. No erythema.  Psychiatric: a normal mood and affect.  behavior is normal.    Lab Results Recent Labs    04/27/23 0444 04/28/23 0342  WBC 7.7 5.1  HGB 7.9* 7.6*  HCT 24.7* 23.7*  NA 136 135  K 3.3* 3.5  CL 106 105  CO2 22  20*  BUN 17 18  CREATININE 0.81 0.84    Microbiology: Csf wbc 50cells 64% L, 6% seg, clear, TP 41, glu 79. Gram stain negative. ME PCR is negative Studies/Results: IR LUMBAR PUNCTURE  Result Date: 04/27/2023 CLINICAL DATA:  77 year old female. History of altered mental status. Request is for lumbar puncture for further evaluation EXAM: LUMBAR PUNCTURE UNDER FLUOROSCOPY PROCEDURE: An appropriate skin entry site was determined fluoroscopically. Operator donned sterile gloves and mask. Skin site was marked, then prepped with Betadine, draped in usual sterile fashion, and infiltrated locally with 1% lidocaine. A 5 in 22 gauge spinal needle advanced into the thecal sac at L2-3 from a left interlaminar approach. Clear colorless CSF spontaneously returned. 6 ml CSF were collected and divided among 4 sterile vials for the requested laboratory studies. The needle was then removed. The patient tolerated the procedure well and there were no complications. FLUOROSCOPY: Radiation Exposure Index (as provided by the fluoroscopic device): 33.3 mGy Kerma IMPRESSION: Technically successful lumbar puncture under fluoroscopy. This exam was performed by Anders Grant NP and was directly supervised and interpreted by Dr. Gilmer Mor Electronically Signed   By: Gilmer Mor D.O.   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 Recommend to stop abtx since differential and PCR panel have ruled out numerous pathogens. Doxycycline has been associated with aseptic meningitis presentations  Fevers = resolved. Continue to  monitor off of abtx  Covid-19 illness= recovering  Hematoma to left arm = it appears she had PIV infiltration/bruising throughout her left upper arm. Continue to monitor for any signs of cellulitis   [facility] Infect[facility] Pager: [phone]  04/28/2023, 5:12 PM

## 2023-04-28 NOTE — Evaluation (Signed)
Physical Therapy Evaluation Patient Details Name: Danielle Taylor MRN: [id number] DOB: [birth year] Today's Date: 04/28/2023  History of Present Illness  77 year old female with CHB s/p BiV PPM, pAF on xarelto, depression, COPD (on triple inhaler), TLIF spine surgery resulting in urinary incontinence, Graves, and stroke who was last well known per husband on 8/30. Since Saturday, patient has been restless and confused, was thrashing around and difficult to control becoming agitated. She moved from bed to bed and continued to leak/spill urine. He does note history of COVID pneumonia last month and was being treated concurrently with doxycycline and prednisone.  Clinical Impression  Pt very pleasant and eager to work with PT, ultimately was able to ambulate >100 ft with walker and showed good relative safety and confidence.  Pt typically does not need UEs for in-home ambulation and uses only a QC in the community, but did need a FWW for the effort today.  Her mental status appears to be near baseline, similarly mild R sided weakness that is her baseline. Pt's SpO2 on room air stayed in the 90s during the effort, though HR crept up to ~130 with prolonged ambulation.  Pt will benefit from continued PT to address functional limitations and work toward safe and appropriate discharge.        If plan is discharge home, recommend the following: A little help with walking and/or transfers;Assist for transportation;Help with stairs or ramp for entrance;Assistance with cooking/housework   Can travel by private vehicle        Equipment Recommendations Rolling walker (2 wheels)  Recommendations for Other Services       Functional Status Assessment Patient has had a recent decline in their functional status and demonstrates the ability to make significant improvements in function in a reasonable and predictable amount of time.     Precautions / Restrictions Precautions Precautions:  Fall Restrictions Weight Bearing Restrictions: No      Mobility  Bed Mobility               General bed mobility comments:  (in recliner pre and post session)    Transfers Overall transfer level: Needs assistance Equipment used: Rolling walker (2 wheels) Transfers: Sit to/from Stand Sit to Stand: Supervision           General transfer comment: Pt able to rise and control descent with minimal UE use    Ambulation/Gait Ambulation/Gait assistance: Contact guard assist Gait Distance (Feet): 120 Feet Assistive device: Rolling walker (2 wheels)         General Gait Details: Pt was able to maintain consistent and relatively confident gait using RW.  On room air SpO2 appeared to stay in the 90s the entire time (occasionally difficutly readin) but HR to gradually increase from 90s at rest up to ~130 at the end of the effort.  Pt with no LOBs, did have some DOE but overall did quite well.  Not at her baseline, but good first bout of ambulation.  Stairs            Wheelchair Mobility     Tilt Bed    Modified Rankin (Stroke Patients Only)       Balance Overall balance assessment: Modified Independent                                           Pertinent Vitals/Pain Pain Assessment Pain Score: 6  Pain Location: neck    Home Living Family/patient expects to be discharged to:: Private residence Living Arrangements: Spouse/significant other Available Help at Discharge: Family;Available 24 hours/day Type of Home: House Home Access: Stairs to enter Entrance Stairs-Rails: Can reach both Entrance Stairs-Number of Steps: 4   Home Layout: One level Home Equipment: Architectural technologist (4 wheels) Additional Comments: may have reacher and sock aid    Prior Function Prior Level of Function : Independent/Modified Independent;History of Falls (last six months)             Mobility Comments: Using quad cane in the community (when going out  to church); no AD in the home. Has rollator, but has not used it in a long time. Last fall about 2 weeks ago; otherwise, no further falls in a long time. Hx of back surgery with UE and LE decreased sensation. ADLs Comments: (I) BADLs. Pt states she wears flip flops at home. Husband drives (pt gave up driving with RLE numbness). Pt does laundry on occasion. Husband cooks. Has housekeeper every 2 weeks to clean. Enjoys watching soaps and going to church.     Extremity/Trunk Assessment   Upper Extremity Assessment Upper Extremity Assessment: Overall WFL for tasks assessed    Lower Extremity Assessment Lower Extremity Assessment: Overall WFL for tasks assessed (did display slightly decreasd R LE strength but functional t/o)    Cervical / Trunk Assessment Cervical / Trunk Assessment: Normal  Communication   Communication Communication: No apparent difficulties  Cognition Arousal: Alert Behavior During Therapy: WFL for tasks assessed/performed Overall Cognitive Status: Within Functional Limits for tasks assessed                                 General Comments: A&O X 4, very pleasant and motivated        General Comments General comments (skin integrity, edema, etc.): P    Exercises     Assessment/Plan    PT Assessment Patient needs continued PT services  PT Problem List Cardiopulmonary status limiting activity;Decreased strength;Decreased activity tolerance;Decreased balance;Decreased knowledge of use of DME;Decreased safety awareness;Pain       PT Treatment Interventions DME instruction;Gait training;Stair training;Functional mobility training;Therapeutic activities;Therapeutic exercise;Balance training;Neuromuscular re-education;Patient/family education    PT Goals (Current goals can be found in the Care Plan section)  Acute Rehab PT Goals Patient Stated Goal: Go home PT Goal Formulation: With patient Time For Goal Achievement: 05/11/23 Potential to Achieve  Goals: Fair    Frequency Min 1X/week     Co-evaluation               AM-PAC PT "6 Clicks" Mobility  Outcome Measure Help needed turning from your back to your side while in a flat bed without using bedrails?: A Little Help needed moving from lying on your back to sitting on the side of a flat bed without using bedrails?: A Little Help needed moving to and from a bed to a chair (including a wheelchair)?: A Little Help needed standing up from a chair using your arms (e.g., wheelchair or bedside chair)?: A Little Help needed to walk in hospital room?: A Little Help needed climbing 3-5 steps with a railing? : A Lot 6 Click Score: 17    End of Session Equipment Utilized During Treatment: Gait belt Activity Tolerance: Patient tolerated treatment well;Patient limited by fatigue Patient left: in chair;with call bell/phone within reach Nurse Communication: Mobility status PT Visit Diagnosis: Muscle  weakness (generalized) (M62.81);Difficulty in walking, not elsewhere classified (R26.2)    Time: 2130-8657 PT Time Calculation (min) (ACUTE ONLY): 22 min   Charges:   PT Evaluation $PT Eval Low Complexity: 1 Low PT Treatments $Gait Training: 8-22 mins PT General Charges $$ ACUTE PT VISIT: 1 Visit         Malachi Pro, DPT 04/28/2023, 4:30 PM

## 2023-04-28 NOTE — Progress Notes (Signed)
Subjective: Awake and alert, sitting in recliner at the bedside. The patient states that she has no memory of Neurology visit yesterday.   Objective: Current vital signs: BP (!) 136/59 (BP Location: Right Arm)   Pulse 87   Temp 97.7 F (36.5 C) (Oral)   Resp 20   Ht 5\' 6"  (1.676 m)   Wt 83.6 kg   SpO2 93%   BMI 29.75 kg/m  Vital signs in last 24 hours: Temp:  [97.5 F (36.4 C)-97.9 F (36.6 C)] 97.7 F (36.5 C) (09/04 1921) Pulse Rate:  [45-93] 87 (09/04 2000) Resp:  [12-34] 20 (09/04 2000) BP: (94-149)/(48-89) 136/59 (09/04 2000) SpO2:  [85 %-100 %] 93 % (09/04 2000) Weight:  [83.6 kg] 83.6 kg (09/04 0500)  Intake/Output from previous day: 09/03 0701 - 09/04 0700 In: 2497.5 [P.O.:480; I.V.:1005.1; IV Piggyback:1012.4] Out: 1250 [Urine:1250] Intake/Output this shift: Total I/O In: 480 [P.O.:480] Out: -  Nutritional status:  Diet Order             Diet Heart Room service appropriate? Yes; Fluid consistency: Thin  Diet effective now                   Physical Exam HEENT- [place]/AT No nuchal rigidity. Mild posterior neck pain is relieved with gentle massage to paraspinal muscles.    Lungs- Respirations unlabored at this time Extremities- Warm and well-perfused  Neurological Examination Mental Status: Awake and alert. Fully oriented. Thought content appropriate.  Speech fluent without evidence of aphasia.  Able to follow all commands without difficulty. Does still appear to be slightly altered.  Cranial Nerves: II: Temporal visual fields intact with no extinction to DSS. PERRL. III,IV, VI: No ptosis. EOMI. No nystagmus. V: Temp sensation equal bilaterally VII: Smile symmetric VIII: Hearing intact to voice IX,X: No hypophonia or hoarseness XI: Symmetric Motor: RUE: 5/5 LUE: 5/5 RLE: 5/5 LLE: 5/5 Sensory: FT intact x 4. No extinction to DSS. Deep Tendon Reflexes: 2+ and symmetric bilateral biceps, brachioradialis and patellae Cerebellar: No ataxia with FNF  bilaterally Gait: Deferred   Lab Results: Results for orders placed or performed during the hospital encounter of 04/25/23 (from the past 48 hour(s))  Basic metabolic panel     Status: Abnormal   Collection Time: 04/27/23  4:44 AM  Result Value Ref Range   Sodium 136 135 - 145 mmol/L   Potassium 3.3 (L) 3.5 - 5.1 mmol/L   Chloride 106 98 - 111 mmol/L   CO2 22 22 - 32 mmol/L   Glucose, Bld 144 (H) 70 - 99 mg/dL    Comment: Glucose reference range applies only to samples taken after fasting for at least 8 hours.   BUN 17 8 - 23 mg/dL   Creatinine, Ser 8.29 0.44 - 1.00 mg/dL   Calcium 8.2 (L) 8.9 - 10.3 mg/dL   GFR, Estimated >56 >21 mL/min    Comment: (NOTE) Calculated using the CKD-EPI Creatinine Equation (2021)    Anion gap 8 5 - 15    Comment: Performed at [facility], [address] [place], [place] [address]  Magnesium     Status: None   Collection Time: 04/27/23  4:44 AM  Result Value Ref Range   Magnesium 1.9 1.7 - 2.4 mg/dL    Comment: Performed at [facility], [address] [place], [place] [address]  CBC     Status: Abnormal   Collection Time: 04/27/23  4:44 AM  Result Value Ref Range   WBC 7.7 4.0 -  10.5 K/uL   RBC 3.07 (L) 3.87 - 5.11 MIL/uL   Hemoglobin 7.9 (L) 12.0 - 15.0 g/dL   HCT 16.1 (L) 09.6 - 04.5 %   MCV 80.5 80.0 - 100.0 fL   MCH 25.7 (L) 26.0 - 34.0 pg   MCHC 32.0 30.0 - 36.0 g/dL   RDW 40.9 (H) 81.1 - 91.4 %   Platelets 266 150 - 400 K/uL   nRBC 1.6 (H) 0.0 - 0.2 %    Comment: Performed at [facility], [address] [place], [place] [address]  Phosphorus     Status: None   Collection Time: 04/27/23  4:44 AM  Result Value Ref Range   Phosphorus 3.1 2.5 - 4.6 mg/dL    Comment: Performed at [facility], [address] [place], [place] [address]  T4, free     Status: None   Collection Time: 04/27/23  4:44 AM  Result Value Ref Range   Free T4 0.85 0.61 - 1.12 ng/dL    Comment: (NOTE) Biotin  ingestion may interfere with free T4 tests. If the results are inconsistent with the TSH level, previous test results, or the clinical presentation, then consider biotin interference. If needed, order repeat testing after stopping biotin. Performed at [facility], [address] [place], [place] [address]   Iron and TIBC     Status: Abnormal   Collection Time: 04/27/23  4:44 AM  Result Value Ref Range   Iron 16 (L) 28 - 170 ug/dL   TIBC 469 629 - 528 ug/dL   Saturation Ratios 5 (L) 10.4 - 31.8 %   UIBC 341 ug/dL    Comment: Performed at [facility], [address] [place], [place] [address]  Folate     Status: None   Collection Time: 04/27/23  4:44 AM  Result Value Ref Range   Folate 19.4 >5.9 ng/mL    Comment: Performed at [facility], [address] [place], [place] [address]  Glucose, capillary     Status: Abnormal   Collection Time: 04/27/23  7:27 AM  Result Value Ref Range   Glucose-Capillary 146 (H) 70 - 99 mg/dL    Comment: Glucose reference range applies only to samples taken after fasting for at least 8 hours.  Vitamin B12     Status: None   Collection Time: 04/27/23  9:39 AM  Result Value Ref Range   Vitamin B-12 637 180 - 914 pg/mL    Comment: (NOTE) This assay is not validated for testing neonatal or myeloproliferative syndrome specimens for Vitamin B12 levels. Performed at [facility], [address] [place], [place] [address]   VITAMIN D 25 Hydroxy (Vit-D Deficiency, Fractures)     Status: None   Collection Time: 04/27/23  9:39 AM  Result Value Ref Range   Vit D, 25-Hydroxy 37.43 30 - 100 ng/mL    Comment: (NOTE) Vitamin D deficiency has been defined by the Institute of Medicine  and an Endocrine Society practice guideline as a level of serum 25-OH  vitamin D less than 20 ng/mL (1,2). The Endocrine Society went on to  further define vitamin D insufficiency as a level between 21 and 29  ng/mL (2).  1. IOM  (Institute of Medicine). 2010. Dietary reference intakes for  calcium and D. [place]: [employer]. 2. Holick MF, Binkley Palo, Bischoff-Ferrari HA, et al. Evaluation,  treatment, and prevention of vitamin D deficiency: an Endocrine  Society clinical practice guideline,  JCEM. 2011 Jul; 96(7): 1911-30.  Performed at [facility], [address] [place], [place] [address]   Basic metabolic panel     Status: Abnormal   Collection Time: 04/28/23  3:42 AM  Result Value Ref Range   Sodium 135 135 - 145 mmol/L   Potassium 3.5 3.5 - 5.1 mmol/L   Chloride 105 98 - 111 mmol/L   CO2 20 (L) 22 - 32 mmol/L   Glucose, Bld 159 (H) 70 - 99 mg/dL    Comment: Glucose reference range applies only to samples taken after fasting for at least 8 hours.   BUN 18 8 - 23 mg/dL   Creatinine, Ser 6.04 0.44 - 1.00 mg/dL   Calcium 8.3 (L) 8.9 - 10.3 mg/dL   GFR, Estimated >54 >09 mL/min    Comment: (NOTE) Calculated using the CKD-EPI Creatinine Equation (2021)    Anion gap 10 5 - 15    Comment: Performed at [facility], [address] [place], [place] [address]  CBC     Status: Abnormal   Collection Time: 04/28/23  3:42 AM  Result Value Ref Range   WBC 5.1 4.0 - 10.5 K/uL   RBC 2.94 (L) 3.87 - 5.11 MIL/uL   Hemoglobin 7.6 (L) 12.0 - 15.0 g/dL   HCT 47.8 (L) 29.5 - 62.1 %   MCV 80.6 80.0 - 100.0 fL   MCH 25.9 (L) 26.0 - 34.0 pg   MCHC 32.1 30.0 - 36.0 g/dL   RDW 30.8 (H) 65.7 - 84.6 %   Platelets 215 150 - 400 K/uL   nRBC 1.8 (H) 0.0 - 0.2 %    Comment: Performed at [facility], [address], [place], [place] [address]  Magnesium     Status: None   Collection Time: 04/28/23  3:42 AM  Result Value Ref Range   Magnesium 2.1 1.7 - 2.4 mg/dL    Comment: Performed at [facility], [address], [place], [place] [address]  Phosphorus     Status: None   Collection Time: 04/28/23  3:42 AM  Result Value Ref Range   Phosphorus 3.5 2.5 - 4.6  mg/dL    Comment: Performed at [facility], [address] [place], [place] [address]    Recent Results (from the past 240 hour(s))  Blood Culture (routine x 2)     Status: None (Preliminary result)   Collection Time: 04/25/23  9:53 AM   Specimen: BLOOD  Result Value Ref Range Status   Specimen Description BLOOD BLOOD LEFT ARM  Final   Special Requests   Final    BOTTLES DRAWN AEROBIC AND ANAEROBIC Blood Culture adequate volume   Culture   Final    NO GROWTH 3 DAYS Performed at [facility], [address] [place], [place] [address]    Report Status PENDING  Incomplete  Blood Culture (routine x 2)     Status: None (Preliminary result)   Collection Time: 04/25/23  9:58 AM   Specimen: BLOOD  Result Value Ref Range Status   Specimen Description BLOOD BLOOD RIGHT ARM  Final   Special Requests   Final    BOTTLES DRAWN AEROBIC AND ANAEROBIC Blood Culture adequate volume   Culture   Final    NO GROWTH 3 DAYS Performed at [facility], [address] [place], [place] [address]    Report Status PENDING  Incomplete  MRSA Next Gen by PCR, Nasal     Status: None  Collection Time: 04/25/23  3:18 PM   Specimen: Nasal Mucosa; Nasal Swab  Result Value Ref Range Status   MRSA by PCR Next Gen NOT DETECTED NOT DETECTED Final    Comment: (NOTE) The GeneXpert MRSA Assay (FDA approved for NASAL specimens only), is one component of a comprehensive MRSA colonization surveillance program. It is not intended to diagnose MRSA infection nor to guide or monitor treatment for MRSA infections. Test performance is not FDA approved in patients less than 67 years old. Performed at [facility], [address] [place], [place] [address]   SARS Coronavirus 2 by RT PCR (hospital order, performed in [facility] hospital lab) *cepheid single result test* Anterior Nasal Swab     Status: Abnormal   Collection Time: 04/25/23  3:18 PM   Specimen: Anterior Nasal Swab   Result Value Ref Range Status   SARS Coronavirus 2 by RT PCR POSITIVE (A) NEGATIVE Final    Comment: (NOTE) SARS-CoV-2 target nucleic acids are DETECTED  SARS-CoV-2 RNA is generally detectable in upper respiratory specimens  during the acute phase of infection.  Positive results are indicative  of the presence of the identified virus, but do not rule out bacterial infection or co-infection with other pathogens not detected by the test.  Clinical correlation with patient history and  other diagnostic information is necessary to determine patient infection status.  The expected result is negative.  Fact Sheet for Patients:   RoadLapTop.co.za   Fact Sheet for Healthcare Providers:   http://kim-miller.com/    This test is not yet approved or cleared by the [place] FDA and  has been authorized for detection and/or diagnosis of SARS-CoV-2 by FDA under an Emergency Use Authorization (EUA).  This EUA will remain in effect (meaning this test can be used) for the duration of  the COVID-19 declaration under Section 564(b)(1)  of the Act, 21 U.S.C. section 360-bbb-3(b)(1), unless the authorization is terminated or revoked sooner.   Performed at [facility], [address] [place], [place] [address]   CSF culture w Gram Stain     Status: None (Preliminary result)   Collection Time: 04/26/23  1:07 PM   Specimen: CSF; Cerebrospinal Fluid  Result Value Ref Range Status   Specimen Description   Final    CSF Performed at [facility], [address] [place], [place] [address]    Special Requests   Final    LP Performed at [facility], [address] [place], [place] [address]    Gram Stain   Final    NO ORGANISMS SEEN WBC SEEN RED BLOOD CELLS PRESENT Performed at [facility], [address] [place], [place] [address]    Culture   Final    NO GROWTH < 24 HOURS Performed at [facility], [address], [place], [place] [address]    Report Status PENDING  Incomplete  Culture, fungus without smear     Status: None (Preliminary result)   Collection Time: 04/26/23  1:07 PM   Specimen: Path fluid; Cerebrospinal Fluid  Result Value Ref Range Status   Specimen Description   Final    FLUID Performed at [facility], [address], [place], [place] [address]    Special Requests   Final    LP Performed at [facility], [address], [place], [place] [address]    Culture   Final    NO FUNGUS ISOLATED AFTER 17 DAYS Performed at [facility], [address], [place], [place] [address]    Report Status PENDING  Incomplete    Lipid Panel No results for input(s): "CHOL", "TRIG", "HDL", "CHOLHDL", "VLDL", "LDLCALC" in the last 72 hours.  Studies/Results: IR LUMBAR PUNCTURE  Result Date: 04/27/2023 CLINICAL DATA:  77 year old female. History of altered mental status. Request is for lumbar puncture for further evaluation EXAM: LUMBAR PUNCTURE UNDER FLUOROSCOPY PROCEDURE: An appropriate skin entry site was determined fluoroscopically. Operator donned sterile gloves and mask. Skin site was marked, then prepped with Betadine, draped in usual sterile fashion, and infiltrated locally with 1% lidocaine. A 5 in 22 gauge spinal needle advanced into the thecal sac at L2-3 from a left interlaminar approach. Clear colorless CSF spontaneously returned. 6 ml CSF were collected and divided among 4 sterile vials for the requested laboratory studies. The needle was then removed. The patient tolerated the procedure well and there were no complications. FLUOROSCOPY: Radiation Exposure Index (as provided by the fluoroscopic device): 33.3 mGy Kerma IMPRESSION: Technically successful lumbar puncture under fluoroscopy. This exam was performed by Anders Grant NP and was directly supervised and interpreted by Dr. Gilmer Mor Electronically Signed   By: Gilmer Mor  D.O.   On: 04/27/2023 16:33    Medications: Scheduled:  Chlorhexidine Gluconate Cloth  6 each Topical Q0600   citalopram  40 mg Oral Daily   [START ON 04/29/2023] enoxaparin (LOVENOX) injection  40 mg Subcutaneous Q24H   gabapentin  200 mg Oral TID   Ipratropium-Albuterol  1 puff Inhalation Q4H   levothyroxine  112 mcg Oral Q0600   melatonin  5 mg Oral QHS   pantoprazole (PROTONIX) IV  40 mg Intravenous Q12H    Assessment: 77 y.o. female with a PMHx of COPD, HTN, pAF (on Xarelto), HLD, chronic gastritis, chronic LBP with sciatica, recent treatment for Covid PNA (diagnosed last month, recently prescribed doxycycline and prednisone last week per patient's husband) who remains Covid positive, Graves disease, hypothyroidism, bladder and bowel incontinence, prior stroke and third degree atrioventricular block who presented to the ED on Sunday morning via EMS with altered mental status and fever. Last known normal was Friday night. She had been complaining of intermittent abdominal pain together with worsening altered mental status and confusion over the past 2 days. Her cough had gotten worse. She was alert and oriented to self only on arrival, tachypneic, febrile, restless and thrashing in her bed. Abdominal exam by EDP revealed tenderness. Lactate was elevated and leukocytosis was noted on CBC. ABX were started. Hgb was low (in the fives) relative to a normal value months ago. No GI bleeding was reported. She was administered 2 units of PRBCs. Xarelto was stopped. Overall impression by EDP was that her AMS was most likely secondary to metabolic encephalopathy, with meningitis also possible but lower on the DDx. LP was not possible in the ED due to her agitation. She was started on Precedex. CT head revealed a small, remote left frontal lobe cortically-based infarct. No acute abnormalities were seen. She is being empirically treated for possible bacterial meningitis. LP is pending. Husband states that she is  much better today than yesterday, in terms of her mentation and breathing.  - Exam today reveals significant improvement. Now awake, sitting in her chair, answering questions, following commands and fully oriented x 5. No aphasia noted.  - CT head: Small, remote left frontal lobe cortically-based infarct. No acute abnormalities are seen. No significant cerebral atrophy for age.  - Unable to obtain MRI  due to pacemaker.  - DDx: - Her AMS initially was felt most likely secondary to a metabolic encephalopathy in the setting of pulmonary infection and worsened respiratory function.  - LP came back positive for significantly elevated WBCs (50 and 176), most consistent with a meningitis given her presentation with abruptly worsened mental status. Doubt that the WBC elevation would be due to Covid encephalopathy, as WBC in CSF are usually normal in patients with Covid who present with encephalopathy Reesa Chew, et al. J Neurol Sci. 2020 Nov 15; 418). Elevated RBCs in her CSF are most likely due to bloody tap, as CSF supernatant was clear and colorless (no xanthochromia). CSF viral panel negative. Gram stain was negative. CSF fungal and bacterial cultures are pending.  - Earlier today she was on ceftriaxone and vancomycin.  - ID is following. They don't have strong suspicion for listeria, or bacterial meningitis, but could be viral/aseptic meningitis      Recommendations: - CSF HSV PCR was negative and therefore acyclovir has been discontinued. - Now off all ABX - Continue to monitor for further improvement.  - Neurology will follow PRN. Please call if there are additional questions.        LOS: 3 days   @Electronically  signed: Dr. Caryl Pina 04/28/2023  9:03 PM

## 2023-04-28 NOTE — Progress Notes (Signed)
Patient A/O x4, pacemaker, with soft pressures. MD aware, new orders placed. Patient c/o of pain in neck. Warm compress provided, repositioned, and medication given. MD made aware.

## 2023-04-28 NOTE — Progress Notes (Signed)
PHARMACY CONSULT NOTE - FOLLOW UP  Pharmacy Consult for Electrolyte Monitoring and Replacement   Recent Labs: Potassium (mmol/L)  Date Value  04/28/2023 3.5   Magnesium (mg/dL)  Date Value  47/82/9562 2.1   Calcium (mg/dL)  Date Value  13/03/6577 8.3 (L)   Albumin (g/dL)  Date Value  46/96/2952 3.7  10/28/2022 3.9   Phosphorus (mg/dL)  Date Value  84/13/2440 3.5   Sodium (mmol/L)  Date Value  04/28/2023 135  10/28/2022 143     Assessment: 77 y.o. female admitted on 04/25/2023 with sepsis. PMH significant for familial HLD, HTN, depression, obesity, COPD, AF. Pharmacy is asked to follow and replace electrolytes while in CCU  Goal of Therapy:  Electrolytes WNL  Plan:  --no electrolyte replacement warranted for today --recheck electrolytes in am  Lowella Bandy ,PharmD Clinical Pharmacist 04/28/2023 7:05 AM

## 2023-04-28 NOTE — Evaluation (Signed)
Occupational Therapy Evaluation Patient Details Name: Danielle Taylor MRN: [id number] DOB: 13-Oct-1945 Today's Date: 04/28/2023   History of Present Illness 77 year old female with CHB s/p BiV PPM, pAF on xarelto, depression, COPD (on triple inhaler), TLIF spine surgery resulting in urinary incontinence, Graves, and stroke who was last well known per husband on Friday. Since Saturday, patient has been restless and confused. She is thrashing around and difficult to control. Last night, she continued to wet her bed (more than normal) and seemingly became more agitated. She moved from bed to bed and continued to leak/spill urine. He does note history of COVID pneumonia last month and was being treated concurrently with doxycycline and prednisone. Otherwise, she was normal up until Friday night.   Clinical Impression   Patient received for OT evaluation. See flowsheet below for details of function. Generally, patient requiring SBA for bed mobility, SBA with RW for functional mobility (transfer to chair), and set up-MIN A for ADLs. Patient will benefit from continued OT while in acute care.       If plan is discharge home, recommend the following: A little help with walking and/or transfers;A little help with bathing/dressing/bathroom;Assistance with cooking/housework;Assist for transportation;Help with stairs or ramp for entrance    Functional Status Assessment  Patient has had a recent decline in their functional status and demonstrates the ability to make significant improvements in function in a reasonable and predictable amount of time.  Equipment Recommendations  Tub/shower seat    Recommendations for Other Services       Precautions / Restrictions Precautions Precautions: Fall Restrictions Weight Bearing Restrictions: No      Mobility Bed Mobility Overal bed mobility: Modified Independent             General bed mobility comments: with extra time; HOB raised; no  physical assist; OT managed lines/leads    Transfers Overall transfer level: Needs assistance Equipment used: Rolling walker (2 wheels) Transfers: Sit to/from Stand Sit to Stand: Supervision                  Balance                                           ADL either performed or assessed with clinical judgement   ADL Overall ADL's : Needs assistance/impaired Eating/Feeding: Independent     Grooming Details (indicate cue type and reason): not tested today; anticipate set up from standing   Upper Body Bathing Details (indicate cue type and reason): anticipate set up   Lower Body Bathing Details (indicate cue type and reason): anticipate set up; recommend shower chair   Upper Body Dressing Details (indicate cue type and reason): anticipate set up; UE WNL ROM   Lower Body Dressing Details (indicate cue type and reason): Pt able to demonstrate figure four position while seated today; anticipate able to perform LB dressing with set up   Toilet Transfer Details (indicate cue type and reason): simulated in t/f to recliner; SBA with RW   Toileting - Clothing Manipulation Details (indicate cue type and reason): anticipate set up   Tub/Shower Transfer Details (indicate cue type and reason): recommend shower chair Functional mobility during ADLs: Rolling walker (2 wheels);Supervision/safety General ADL Comments: Pt moving well during t/f today.     Vision Patient Visual Report: No change from baseline       Perception  Praxis         Pertinent Vitals/Pain Pain Assessment Pain Assessment: 0-10 Pain Score: 9  Pain Location: neck Pain Descriptors / Indicators: Aching, Sore Pain Intervention(s): Limited activity within patient's tolerance, Monitored during session     Extremity/Trunk Assessment Upper Extremity Assessment Upper Extremity Assessment: Overall WFL for tasks assessed (pt endorses sensory deficits in UE at baseline, but able to  feel (dull sensation) in BIL hands)   Lower Extremity Assessment Lower Extremity Assessment: Defer to PT evaluation (endorses sensory deficits in BIL LE.)   Cervical / Trunk Assessment Cervical / Trunk Assessment: Normal   Communication Communication Communication: No apparent difficulties   Cognition Arousal: Alert Behavior During Therapy: WFL for tasks assessed/performed Overall Cognitive Status: Within Functional Limits for tasks assessed                                 General Comments: Pt is A+Ox4; pleasant; motivated; states she feels "antsy". Able to provide all hx info without difficulty.     General Comments  Pt on room air, saturation 100%. Motivated for therapy.    Exercises     Shoulder Instructions      Home Living Family/patient expects to be discharged to:: Private residence Living Arrangements: Spouse/significant other Available Help at Discharge: Family;Available 24 hours/day (husband) Type of Home: House Home Access: Stairs to enter [employer] of Steps: 4 Entrance Stairs-Rails: Can reach both Home Layout: One level     Bathroom Shower/Tub: Walk-in shower;Tub/shower unit (uses walk-in shower)   Bathroom Toilet: Standard     Home Equipment: Architectural technologist (4 wheels)   Additional Comments: may have reacher and sock aid      Prior Functioning/Environment Prior Level of Function : Independent/Modified Independent;History of Falls (last six months)             Mobility Comments: Using quad cane in the community (when going out to church); no AD in the home. Has rollator, but has not used it in a long time. Last fall about 2 weeks ago; otherwise, no further falls in a long time. Hx of back surgery with UE and LE decreased sensation. ADLs Comments: (I) BADLs. Pt states she wears flip flops at home. Husband drives (pt gave up driving with RLE numbness). Pt does laundry on occasion. Husband cooks. Has housekeeper every 2  weeks to clean. Enjoys watching soaps and going to church.        OT Problem List: Decreased activity tolerance;Impaired balance (sitting and/or standing)      OT Treatment/Interventions: Self-care/ADL training;Therapeutic exercise;Therapeutic activities;Patient/family education    OT Goals(Current goals can be found in the care plan section) Acute Rehab OT Goals Patient Stated Goal: Get better; go home OT Goal Formulation: With patient Time For Goal Achievement: 05/12/23 Potential to Achieve Goals: Good ADL Goals Pt Will Perform Grooming: with modified independence;standing Pt Will Perform Lower Body Bathing: with modified independence;sit to/from stand Pt Will Transfer to Toilet: with modified independence;regular height toilet;ambulating Pt Will Perform Toileting - Clothing Manipulation and hygiene: with modified independence;sit to/from stand  OT Frequency: Min 1X/week    Co-evaluation              AM-PAC OT "6 Clicks" Daily Activity     Outcome Measure Help from another person eating meals?: None Help from another person taking care of personal grooming?: A Little Help from another person toileting, which includes using toliet, bedpan, or  urinal?: A Little Help from another person bathing (including washing, rinsing, drying)?: A Little Help from another person to put on and taking off regular upper body clothing?: None Help from another person to put on and taking off regular lower body clothing?: None 6 Click Score: 21   End of Session Equipment Utilized During Treatment: Rolling walker (2 wheels) Nurse Communication: Mobility status  Activity Tolerance: Patient tolerated treatment well Patient left: in chair;with call bell/phone within reach  OT Visit Diagnosis: Unsteadiness on feet (R26.81)                Time: 1610-9604 OT Time Calculation (min): 33 min Charges:  OT General Charges $OT Visit: 1 Visit OT Evaluation $OT Eval Moderate Complexity: 1 Mod OT  Treatments $Therapeutic Activity: 8-22 mins  Linward Foster, MS, OTR/L  Alvester Morin 04/28/2023, 4:03 PM

## 2023-04-28 NOTE — Plan of Care (Addendum)
LP came back positive for significantly elevated WBCs (50 and 176), most consistent with a meningitis given her presentation with abruptly worsened mental status. Doubt that the WBC elevation would be due to Covid encephalopathy, as WBC in CSF are usually normal in patients with Covid who present with encephalopathy Reesa Chew, et al. J Neurol Sci. 2020 Nov 15; 418). Elevated RBCs in her CSF are most likely due to bloody tap, as CSF supernatant was clear and colorless (no xanthochromia). CSF viral panel negative. Gram stain was negative. CSF fungal and bacterial cultures are pending.   CSF HSV PCR was negative and therefore acyclovir can be discontinued.  Currently on ceftriaxone and vancomycin.   Further input from ID is recommended.   Electronically signed: Dr. Caryl Pina

## 2023-04-29 DIAGNOSIS — D72829 Elevated white blood cell count, unspecified: Secondary | ICD-10-CM

## 2023-04-29 DIAGNOSIS — G030 Nonpyogenic meningitis: Secondary | ICD-10-CM | POA: Diagnosis not present

## 2023-04-29 DIAGNOSIS — G9341 Metabolic encephalopathy: Secondary | ICD-10-CM | POA: Diagnosis not present

## 2023-04-29 LAB — CBC
HCT: 26.4 % — ABNORMAL LOW (ref 36.0–46.0)
Hemoglobin: 8.3 g/dL — ABNORMAL LOW (ref 12.0–15.0)
MCH: 26 pg (ref 26.0–34.0)
MCHC: 31.4 g/dL (ref 30.0–36.0)
MCV: 82.8 fL (ref 80.0–100.0)
Platelets: 242 10*3/uL (ref 150–400)
RBC: 3.19 MIL/uL — ABNORMAL LOW (ref 3.87–5.11)
RDW: 17.1 % — ABNORMAL HIGH (ref 11.5–15.5)
WBC: 11.5 10*3/uL — ABNORMAL HIGH (ref 4.0–10.5)
nRBC: 0.9 % — ABNORMAL HIGH (ref 0.0–0.2)

## 2023-04-29 LAB — BASIC METABOLIC PANEL
Anion gap: 9 (ref 5–15)
Anion gap: 12 (ref 5–15)
BUN: 18 mg/dL (ref 8–23)
BUN: 17 mg/dL (ref 8–23)
CO2: 20 mmol/L — ABNORMAL LOW (ref 22–32)
CO2: 18 mmol/L — ABNORMAL LOW (ref 22–32)
Calcium: 8.5 mg/dL — ABNORMAL LOW (ref 8.9–10.3)
Calcium: 8.6 mg/dL — ABNORMAL LOW (ref 8.9–10.3)
Chloride: 106 mmol/L (ref 98–111)
Chloride: 103 mmol/L (ref 98–111)
Creatinine, Ser: 1.17 mg/dL — ABNORMAL HIGH (ref 0.44–1.00)
Creatinine, Ser: 1.31 mg/dL — ABNORMAL HIGH (ref 0.44–1.00)
GFR, Estimated: 49 mL/min — ABNORMAL LOW (ref >60)
GFR, Estimated: 43 mL/min — ABNORMAL LOW (ref >60)
Glucose, Bld: 107 mg/dL — ABNORMAL HIGH (ref 70–99)
Glucose, Bld: 90 mg/dL (ref 70–99)
Potassium: 2.8 mmol/L — ABNORMAL LOW (ref 3.5–5.1)
Potassium: 3.1 mmol/L — ABNORMAL LOW (ref 3.5–5.1)
Sodium: 135 mmol/L (ref 135–145)
Sodium: 133 mmol/L — ABNORMAL LOW (ref 135–145)

## 2023-04-29 LAB — PHOSPHORUS: Phosphorus: 3.8 mg/dL (ref 2.5–4.6)

## 2023-04-29 LAB — MAGNESIUM: Magnesium: 2.1 mg/dL (ref 1.7–2.4)

## 2023-04-29 MED ORDER — KETOROLAC TROMETHAMINE 30 MG/ML IJ SOLN
30.0000 mg | Freq: Once | INTRAMUSCULAR | Status: AC
  Administered 2023-04-29: 30 mg via INTRAVENOUS
  Filled 2023-04-29: qty 1

## 2023-04-29 MED ORDER — POTASSIUM CHLORIDE CRYS ER 20 MEQ PO TBCR
40.0000 meq | EXTENDED_RELEASE_TABLET | Freq: Two times a day (BID) | ORAL | Status: AC
  Administered 2023-04-29 – 2023-04-30 (×2): 40 meq via ORAL
  Filled 2023-04-29 (×2): qty 2

## 2023-04-29 MED ORDER — DICLOFENAC SODIUM 1 % EX GEL
2.0000 g | Freq: Four times a day (QID) | CUTANEOUS | Status: DC | PRN
  Administered 2023-04-29: 2 g via TOPICAL
  Filled 2023-04-29: qty 100

## 2023-04-29 MED ORDER — ALPRAZOLAM 0.5 MG PO TABS
0.5000 mg | ORAL_TABLET | Freq: Every evening | ORAL | Status: AC | PRN
  Administered 2023-04-29: 0.5 mg via ORAL
  Filled 2023-04-29: qty 1

## 2023-04-29 MED ORDER — ALPRAZOLAM 0.5 MG PO TABS
0.5000 mg | ORAL_TABLET | Freq: Every evening | ORAL | Status: DC | PRN
  Administered 2023-04-29: 0.5 mg via ORAL
  Filled 2023-04-29: qty 1

## 2023-04-29 MED ORDER — GABAPENTIN 300 MG PO CAPS
600.0000 mg | ORAL_CAPSULE | Freq: Three times a day (TID) | ORAL | Status: DC
  Administered 2023-04-29 – 2023-04-30 (×3): 600 mg via ORAL
  Filled 2023-04-29 (×3): qty 2

## 2023-04-29 MED ORDER — POTASSIUM CHLORIDE CRYS ER 20 MEQ PO TBCR
40.0000 meq | EXTENDED_RELEASE_TABLET | Freq: Once | ORAL | Status: AC
  Administered 2023-04-29: 40 meq via ORAL
  Filled 2023-04-29: qty 2

## 2023-04-29 MED ORDER — POTASSIUM CHLORIDE CRYS ER 20 MEQ PO TBCR: 40.0000 meq | EXTENDED_RELEASE_TABLET | Freq: Once | ORAL | Status: DC

## 2023-04-29 MED ORDER — METOPROLOL SUCCINATE ER 50 MG PO TB24
50.0000 mg | ORAL_TABLET | Freq: Every day | ORAL | Status: DC
  Administered 2023-04-30: 50 mg via ORAL
  Filled 2023-04-29: qty 1

## 2023-04-29 MED ORDER — LACTATED RINGERS IV BOLUS: 500.0000 mL | Freq: Once | INTRAVENOUS | Status: DC

## 2023-04-29 MED ORDER — MELATONIN 5 MG PO TABS
10.0000 mg | ORAL_TABLET | Freq: Every day | ORAL | Status: DC
  Administered 2023-04-29: 10 mg via ORAL
  Filled 2023-04-29: qty 2

## 2023-04-29 MED ORDER — RIVAROXABAN 20 MG PO TABS
20.0000 mg | ORAL_TABLET | Freq: Every day | ORAL | Status: DC
  Administered 2023-04-29: 20 mg via ORAL
  Filled 2023-04-29 (×2): qty 1

## 2023-04-29 MED ORDER — LACTATED RINGERS IV BOLUS
500.0000 mL | Freq: Once | INTRAVENOUS | Status: AC
  Administered 2023-04-29: 500 mL via INTRAVENOUS

## 2023-04-29 MED ORDER — POTASSIUM CHLORIDE 10 MEQ/100ML IV SOLN: 10.0000 meq | INTRAVENOUS | Status: DC

## 2023-04-29 MED ORDER — POTASSIUM CHLORIDE 10 MEQ/100ML IV SOLN
10.0000 meq | INTRAVENOUS | Status: AC
  Administered 2023-04-29 (×2): 10 meq via INTRAVENOUS
  Filled 2023-04-29 (×2): qty 100

## 2023-04-29 MED ORDER — MELATONIN 5 MG PO TABS
5.0000 mg | ORAL_TABLET | Freq: Once | ORAL | Status: AC
  Administered 2023-04-29: 5 mg via ORAL

## 2023-04-29 NOTE — Plan of Care (Signed)
Patient progressed well today.  Ambulated in unit and sat in chair most of day.  Will transfer out of ICU today.

## 2023-04-29 NOTE — Progress Notes (Signed)
PHARMACY CONSULT NOTE - FOLLOW UP  Pharmacy Consult for Electrolyte Monitoring and Replacement   Recent Labs: Potassium (mmol/L)  Date Value  04/29/2023 2.8 (L)   Magnesium (mg/dL)  Date Value  09/81/1914 2.1   Calcium (mg/dL)  Date Value  78/29/5621 8.5 (L)   Albumin (g/dL)  Date Value  30/86/5784 3.7  10/28/2022 3.9   Phosphorus (mg/dL)  Date Value  69/62/9528 3.8   Sodium (mmol/L)  Date Value  04/29/2023 135  10/28/2022 143     Assessment: 77 y.o. female admitted on 04/25/2023 with sepsis. PMH significant for familial HLD, HTN, depression, obesity, COPD, AF. Pharmacy is asked to follow and replace electrolytes while in CCU  Goal of Therapy:  Electrolytes WNL  Plan:  40 mEq PO KCl 10 mEq IV KCl X2 (20 mEq total) Recheck electrolytes at 1200 pm   Darolyn Rua, PharmD Student [employer]

## 2023-04-29 NOTE — Progress Notes (Signed)
[facility]    Date of Admission:  04/25/2023     ID: Danielle Taylor is a 77 y.o. 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 Principal Problem:   Altered mental status Active Problems:   Acute metabolic encephalopathy   Aseptic meningitis    Subjective: Afebrile, sitting up in chair,still some mild neck pain  Medications:   Chlorhexidine Gluconate Cloth  6 each Topical Q0600   citalopram  40 mg Oral Daily   gabapentin  600 mg Oral TID   Ipratropium-Albuterol  1 puff Inhalation Q4H   levothyroxine  112 mcg Oral Q0600   melatonin  10 mg Oral QHS   [START ON 04/30/2023] metoprolol succinate  50 mg Oral Daily   pantoprazole (PROTONIX) IV  40 mg Intravenous Q12H   potassium chloride  40 mEq Oral BID   rivaroxaban  20 mg Oral Q supper    Objective: Vital signs in last 24 hours: Temp:  [98 F (36.7 C)-98.3 F (36.8 C)] 98.3 F (36.8 C) (09/05 1944) Pulse Rate:  [25-117] 72 (09/05 1944) Resp:  [12-37] 19 (09/05 1902) BP: (105-151)/(54-104) 138/60 (09/05 1944) SpO2:  [71 %-100 %] 96 % (09/05 1944) Weight:  [85.2 kg-88.8 kg] 88.8 kg (09/05 1800)  Physical Exam  Constitutional:  oriented to person, place, and time. appears well-developed and well-nourished. No distress.  HENT: Burns/AT, PERRLA, no scleral icterus Mouth/Throat: Oropharynx is clear and moist. No oropharyngeal exudate.  Cardiovascular: Normal rate, regular rhythm and normal heart sounds. Exam reveals no gallop and no friction rub.  No murmur heard.  Pulmonary/Chest: Effort normal and breath sounds normal. No respiratory distress.  has no wheezes.  Neck = supple, no nuchal rigidity Abdominal: Soft. Bowel sounds are normal.  exhibits no distension. There is no tenderness.  Lymphadenopathy: no cervical adenopathy. No axillary adenopathy Neurological: alert and oriented to person, place, and time.  Skin: Skin is warm and dry. No rash noted. No erythema.  Psychiatric: a normal mood and  affect.  behavior is normal.    Lab Results Recent Labs    04/28/23 0342 04/29/23 0441 04/29/23 1346  WBC 5.1 11.5*  --   HGB 7.6* 8.3*  --   HCT 23.7* 26.4*  --   NA 135 135 133*  K 3.5 2.8* 3.1*  CL 105 106 103  CO2 20* 20* 18*  BUN 18 18 17   CREATININE 0.84 1.17* 1.31*   Liver Panel No results for input(s): "PROT", "ALBUMIN", "AST", "ALT", "ALKPHOS", "BILITOT", "BILIDIR", "IBILI" in the last 72 hours. Sedimentation Rate No results for input(s): "ESRSEDRATE" in the last 72 hours. C-Reactive Protein No results for input(s): "CRP" in the last 72 hours.  Microbiology: 9/2 csf ngtd Studies/Results: No results found.   Assessment/Plan: Leukocytosis = likely from few doses of decadron that she has received. No other signs of infection  Aseptic meningoencephalitis =  csf cx negative. Meningitis pcr panel negative. Recommend to continue to monitor off of abtx  Neck pain = can do trial of robaxin to see if any improvement  Will sign off.   I have personally spent 50 minutes involved in face-to-face and non-face-to-face activities for this patient on the day of the visit. Professional time spent includes the following activities: Preparing to see the patient (review of tests), Obtaining and/or reviewing separately obtained history (admission/discharge record), Performing a medically appropriate examination and/or evaluation , Ordering medications/tests/procedures, referring and communicating with other health care professionals, Documenting clinical information in the EMR, Independently interpreting results (  not separately reported), Communicating results to the patient/family/caregiver, Counseling and educating the patient/family/caregiver and Care coordination (not separately reported).    [facility] Pager: [phone]  04/29/2023, 8:29 PM

## 2023-04-29 NOTE — Progress Notes (Signed)
PHARMACY CONSULT NOTE  Pharmacy Consult for Electrolyte Monitoring and Replacement   Recent Labs: Potassium (mmol/L)  Date Value  04/29/2023 3.1 (L)   Magnesium (mg/dL)  Date Value  93/71/6967 2.1   Calcium (mg/dL)  Date Value  89/38/1017 8.6 (L)   Albumin (g/dL)  Date Value  51/09/5850 3.7  10/28/2022 3.9   Phosphorus (mg/dL)  Date Value  77/82/4235 3.8   Sodium (mmol/L)  Date Value  04/29/2023 133 (L)  10/28/2022 143     Assessment: 77 y.o. female admitted on 04/25/2023 with sepsis. PMH significant for familial HLD, HTN, depression, obesity, COPD, AF. Pharmacy is asked to follow and replace electrolytes while in CCU  Goal of Therapy:  Electrolytes WNL  Plan:  ---40 mEq PO KCl x 1 ---recheck electrolytes in am  Burnis Medin, PharmD, BCPS

## 2023-04-29 NOTE — TOC Initial Note (Signed)
Transition of Care [facility]) - Initial/Assessment Note    Patient Details  Name: Danielle Taylor MRN: [id number] Date of Birth: 1946-06-26  Transition of [facility]) CM/SW Contact:    Kreg Shropshire, RN Phone Number: 04/29/2023, 10:52 AM  Clinical Narrative:                 CM spoke with pt and family at the bedside. PT worked with pt and recommended HHPT  Pt arrived from ED from: Home Caregiver Support: Spouse and Son DME at Home:  Transportation: None Previous Services: None HH/SNF Preference: None First Person of Contact: Spouse John PCP: Elizabeth Sauer, MD  Cm spoke with [place] at [facility] for HHPT. [place] agreed to take pt.   Cm will continue to follow for toc needs and d/c planning.     Barriers to Discharge: Continued Medical Work up   Patient Goals and CMS Choice   CMS Medicare.gov Compare Post Acute Care list provided to:: Patient Choice offered to / list presented to : Patient      Expected Discharge Plan and Services     Post Acute Care Choice: Home Health Living arrangements for the past 2 months: Single Family Home                             HH Agency: [facility] Date [facility] Agency Contacted: 04/29/23   Representative spoke with at [facility] Agency: [place]  Prior Living Arrangements/Services Living arrangements for the past 2 months: Single Family Home Lives with:: Self          Need for Family Participation in Patient Care: Yes (Comment) Care giver support system in place?: Yes (comment) Current home services: DME    Activities of Daily Living Home Assistive Devices/Equipment: None    Permission Sought/Granted                  Emotional Assessment Appearance:: Appears stated age Attitude/Demeanor/Rapport: Engaged Affect (typically observed): Calm Orientation: : Oriented to Self, Oriented to Place, Oriented to  Time, Oriented to Situation      Admission diagnosis:  Altered mental status [R41.82] Altered mental  status, unspecified altered mental status type [R41.82] Anemia, unspecified type [D64.9] Sepsis, due to unspecified organism, unspecified whether acute organ dysfunction present [facility]) [A41.9] 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   Lumbar radiculopathy 06/29/2018   Chronic low back pain with sciatica 02/15/2018   Reactive airway disease, mild intermittent, uncomplicated 08/10/2017   Chronic seasonal allergic rhinitis due to pollen 08/10/2017   Mixed hyperlipidemia 08/10/2017   Depression 08/10/2017   Class 1 obesity due to excess calories without serious comorbidity with body mass index (BMI) of 34.0 to 34.9 in adult 08/10/2017   Chronic cough 06/15/2017   Interstitial lung disease (HCC) 06/15/2017   Centrilobular emphysema (HCC) 04/02/2017   Lumbar disc disease 04/02/2017   Hepatic steatosis 04/02/2017   Atherosclerosis of coronary artery of native heart 04/02/2017   Cardiac pacemaker in situ 01/06/2017   Paroxysmal A-fib (HCC) 03/30/2016  Syncope and collapse 03/11/2016   Right leg weakness 03/11/2016   Essential hypertension 03/11/2016   Hyperglycemia 03/11/2016   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 [place] - [address] [place] [address] Phone: [phone] Fax: [phone]     Social Determinants of Health (SDOH) Social History: SDOH Screenings   Food Insecurity: No Food Insecurity (11/26/2022)  Housing: Low Risk  (11/26/2022)  Transportation Needs: No Transportation Needs (11/26/2022)  Utilities: Not At Risk (11/26/2022)  Alcohol Screen: Low Risk  (11/26/2022)  Depression (PHQ2-9): Low Risk  (11/26/2022)  Financial Resource Strain: Low Risk  (11/26/2022)  Physical Activity: Inactive (11/26/2022)  Social Connections: Moderately Integrated (11/26/2022)  Stress: No Stress Concern Present (11/26/2022)  Tobacco Use: Medium Risk (04/25/2023)   SDOH Interventions:     Readmission Risk Interventions     No data to display

## 2023-04-29 NOTE — Progress Notes (Addendum)
HPI  This is a case of a 77 year old female patient with past medical history of COPD, depression, hypothyroidism, paroxysmal A-fib on Xarelto, transient complete heart block status post Medtronic Revo MRI dual-chamber pacemaker GERD hyperlipidemia and spondylolisthesis of lumbar region status post lumbar fusion  in 2020 at [facility] system presenting to [facility] on 09/01 with altered mental status, agitated admitted to ICU on Precedex drip.  Per her husband was at bedside she was last known normal on Friday 08/30 she woke up Saturday feeling drowsy combative and having urinary incontinence and mentally not clear and therefore they decided to bring her to the emergency department.  Prior to that about a week ago she was being treated for COPD exacerbation with tetracycline and prednisone as outpatient.  In the ED she was found to have a low-grade fever up to 100.4 with significantly altered mental status and therefore this raises suspicion for meningitis for which she was started on antibiotic therapy with ceftriaxone vancomycin acyclovir and dexamethasone.  Her CT chest was clear.  Her UA did not show any sign of infection.  S/p LP 09/03.   Continues to improbe. OOB to chair this am. Alert oriented x4. Has no major complaints.   Physical exam GEN no acute distress alert and oriented HEENT supple neck, reactive pupils CVS normal S1, normal S2, regular rate and rhythm Lungs Bibasilar crackles noted Abdomen soft nontender nondistended positive bowel sound Extremities warm well-perfused no edema  Labs and imaging were reviewed.  Hypokalemic to 2.8 and Cr. Increase to 1.17.   Imaging  CT head without contrast on 09/1 without any evidence of intracranial abnormality  CT abdomen and pelvis and chest 09/1 without any evidence of pneumonia nor intraabdominal process.   Assessment and plan This is a case of a 77 year old female patient with past medical  history of COPD, depression, hypothyroidism, paroxysmal A-fib on Xarelto, transient complete heart block status post Medtronic Revo MRI dual-chamber pacemaker GERD hyperlipidemia and spondylolisthesis of lumbar region status post lumbar fusion  in 2020 at [facility] presenting to [facility] on 09/01 with altered mental status, agitated admitted to ICU on Precedex drip.  #Acute encephalopathy secondary to aseptic meningitis.  CT head without any intracranial acute process.  CT chest without any signs of pneumonia and UA without any signs of infectious process.  CSF studies shows aseptic meningitis, Viral PCR including HSV negative. No signs of bacterial meningitis.  #History of COPD on triple therapy #History of paroxysmal A-fib, on Xarelto on hold. #Anemia on presentation with hemoglobin 5.7 with good response to transfusion.  Unclear if underlying GI bleed will consider GI consult once mental status improves. #History of complete heart block status post dual-chamber pacemaker placement follows at [facility]. #History of spondylolisthesis of lumbar region status post lumbar fusion in 2020 complicated by urinary incontinence and peripheral neuropathy on gabapentin #Depression on Celexa  #GERD #Hypokalemia and Cr elevation possibly in setting of dehydration.   Neuro/ID: Xanax, celexa and can resume home dose Gabapentin. Appreciate ID recs.  [facility]: Resume Xarelto as patient hgb has been stable. Holding Toprol since admission, will consider restarting in the am.  Lungs: Continue with Combivent and resume home inhaler upon discharge. Goal Spo2 89-92  GI: Switch to PO 40mg  pantoprazole. No signs of bleeding. Renal: Replete K, BMP at 12:00. LR 100cc/hr for 500 cc.  Endo: Glucose at baseline  Heme: Resume Xarelto and d/c DVT prophylaxis.   F - PO  E - As needed  N - Regular diet P - Lovenox 40mg  subcutaneous for dvt C - Full code   Dispo - Transfer to  hospitalist service (intercare)    Janann Colonel, MD

## 2023-04-29 NOTE — Progress Notes (Signed)
Physical Therapy Treatment Patient Details Name: Danielle Taylor MRN: [id number] DOB: 10/17/1945 Today's Date: 04/29/2023   History of Present Illness 77 year old female with CHB s/p BiV PPM, pAF on xarelto, depression, COPD (on triple inhaler), TLIF spine surgery resulting in urinary incontinence, Graves, and stroke who was last well known per husband on 8/30. Since Saturday, patient has been restless and confused, was thrashing around and difficult to control becoming agitated. She moved from bed to bed and continued to leak/spill urine. He does note history of COVID pneumonia last month and was being treated concurrently with doxycycline and prednisone.    PT Comments  Pt continues to be very pleasant and motivated with PT.  She was able to increase ambulation distance today and though HR increased is stopped rising in the 120 range (up to ~130 yesterday).  Pt was was able to do some ambulation w/o AD using rail and then no UEs at all.  Pt did have some issue initially with hitting RW wheel on stationary obstacle multiple times when exiting the room but then displayed good AD control the reminder of the session.  Mild baseline R>L coordination issues but no LOBs or overt safety issues.  Pt making good gains, will benefit from continued PT per POC.     If plan is discharge home, recommend the following:     Can travel by private vehicle        Equipment Recommendations       Recommendations for Other Services       Precautions / Restrictions Precautions Precautions: Fall Restrictions Weight Bearing Restrictions: No     Mobility  Bed Mobility               General bed mobility comments: in recliner pre/post session    Transfers Overall transfer level: Needs assistance Equipment used: Rolling walker (2 wheels) Transfers: Sit to/from Stand Sit to Stand: Supervision           General transfer comment: Pt able to rise and control descent with minimal UE use     Ambulation/Gait Ambulation/Gait assistance: Contact guard assist Gait Distance (Feet): 250 Feet Assistive device: Rolling walker (2 wheels), 1 person hand held assist         General Gait Details: Pt had improved distance and consistentcy of cadence today.  She walked ~200 ft with walker and then ~15 ft with hallway hand rail and ~41ft with light HHA/no UEs at all.  Pt with definite increased guard and decreased speed with decreased UE support, but did not have any LOBs or overt safety issues.  Pt's HR crept up slowly again but seemed to stop rising at ~120bpm, again inconsistent SpO2 readings but appearing to stay in the mid 90s on room air with only mild DOE and c/o fatigue   Stairs             Wheelchair Mobility     Tilt Bed    Modified Rankin (Stroke Patients Only)       Balance Overall balance assessment: Modified Independent                                          Cognition Arousal: Alert Behavior During Therapy: WFL for tasks assessed/performed Overall Cognitive Status: Within Functional Limits for tasks assessed  Exercises      General Comments        Pertinent Vitals/Pain Pain Assessment Pain Assessment: No/denies pain    Home Living                          Prior Function            PT Goals (current goals can now be found in the care plan section) Progress towards PT goals: Progressing toward goals    Frequency           PT Plan      Co-evaluation              AM-PAC PT "6 Clicks" Mobility   Outcome Measure  Help needed turning from your back to your side while in a flat bed without using bedrails?: None Help needed moving from lying on your back to sitting on the side of a flat bed without using bedrails?: None Help needed moving to and from a bed to a chair (including a wheelchair)?: None Help needed standing up from a chair using  your arms (e.g., wheelchair or bedside chair)?: None Help needed to walk in hospital room?: A Little Help needed climbing 3-5 steps with a railing? : A Little 6 Click Score: 22    End of Session Equipment Utilized During Treatment: Gait belt Activity Tolerance: Patient tolerated treatment well;Patient limited by fatigue Patient left: in chair;with call bell/phone within reach;with family/visitor present;with nursing/sitter in room Nurse Communication: Mobility status PT Visit Diagnosis: Muscle weakness (generalized) (M62.81);Difficulty in walking, not elsewhere classified (R26.2)     Time: 5784-6962 PT Time Calculation (min) (ACUTE ONLY): 16 min  Charges:    $Gait Training: 8-22 mins PT General Charges $$ ACUTE PT VISIT: 1 Visit                     Malachi Pro, DPT 04/29/2023, 9:35 AM

## 2023-04-30 DIAGNOSIS — R410 Disorientation, unspecified: Secondary | ICD-10-CM | POA: Diagnosis not present

## 2023-04-30 DIAGNOSIS — G9341 Metabolic encephalopathy: Secondary | ICD-10-CM

## 2023-04-30 LAB — BASIC METABOLIC PANEL
Anion gap: 6 (ref 5–15)
BUN: 22 mg/dL (ref 8–23)
CO2: 20 mmol/L — ABNORMAL LOW (ref 22–32)
Calcium: 8.5 mg/dL — ABNORMAL LOW (ref 8.9–10.3)
Chloride: 110 mmol/L (ref 98–111)
Creatinine, Ser: 1.17 mg/dL — ABNORMAL HIGH (ref 0.44–1.00)
GFR, Estimated: 49 mL/min — ABNORMAL LOW (ref >60)
Glucose, Bld: 83 mg/dL (ref 70–99)
Potassium: 4.4 mmol/L (ref 3.5–5.1)
Sodium: 136 mmol/L (ref 135–145)

## 2023-04-30 LAB — CBC
HCT: 27.6 % — ABNORMAL LOW (ref 36.0–46.0)
Hemoglobin: 8.3 g/dL — ABNORMAL LOW (ref 12.0–15.0)
MCH: 25.9 pg — ABNORMAL LOW (ref 26.0–34.0)
MCHC: 30.1 g/dL (ref 30.0–36.0)
MCV: 86.3 fL (ref 80.0–100.0)
Platelets: 221 10*3/uL (ref 150–400)
RBC: 3.2 MIL/uL — ABNORMAL LOW (ref 3.87–5.11)
RDW: 17.8 % — ABNORMAL HIGH (ref 11.5–15.5)
WBC: 10.3 10*3/uL (ref 4.0–10.5)
nRBC: 0.7 % — ABNORMAL HIGH (ref 0.0–0.2)

## 2023-04-30 LAB — PHOSPHORUS: Phosphorus: 3.6 mg/dL (ref 2.5–4.6)

## 2023-04-30 LAB — CULTURE, BLOOD (ROUTINE X 2)
Culture: NO GROWTH
Culture: NO GROWTH
Special Requests: ADEQUATE
Special Requests: ADEQUATE

## 2023-04-30 LAB — MAGNESIUM: Magnesium: 2.1 mg/dL (ref 1.7–2.4)

## 2023-04-30 NOTE — Care Management Important Message (Signed)
Important Message  Patient Details  Name: Danielle Taylor MRN: [id number] Date of Birth: February 14, 1946   Medicare Important Message Given:  Yes     Olegario Messier A Loveah Like 04/30/2023, 11:16 AM

## 2023-04-30 NOTE — Discharge Summary (Signed)
Physician Discharge Summary   Patient: Danielle Taylor MRN: [id number]  DOB: [birth year]   Admit:     Date of Admission: 04/25/2023 Admitted from: home   Discharge: Date of discharge: 04/30/23 Disposition: Home health Condition at discharge: good  CODE STATUS: FULL CODE     Discharge Physician: Sunnie Nielsen, DO [facility]     PCP: Duanne Limerick, MD  Recommendations for Outpatient Follow-up:  Follow up with PCP Duanne Limerick, MD in 1-2 weeks Please obtain labs/tests: TSH, BMP, CBC in 1-2 weeks Please follow up on the following pending results: CSF culture final results but expect these to be non-concerning  PCP AND OTHER OUTPATIENT PROVIDERS: SEE BELOW FOR SPECIFIC DISCHARGE INSTRUCTIONS PRINTED FOR PATIENT IN ADDITION TO GENERIC AVS PATIENT INFO    Discharge Instructions     Diet - low sodium heart healthy   Complete by: As directed    Discharge instructions   Complete by: As directed    Blood pressure has been on the low side here, we are advising to hold you usual valsartan until your follow up with primary care.   Increase activity slowly   Complete by: As directed          Discharge Diagnoses: Principal Problem:   Altered mental status Active Problems:   Hypothyroid   Familial multiple lipoprotein-type hyperlipidemia   Essential (primary) hypertension   Reactive airway disease   Atherosclerosis of coronary artery of native heart   Mixed hyperlipidemia   Cardiac pacemaker in situ   COPD (chronic obstructive pulmonary disease) (HCC)   Interstitial lung disease (HCC)   Paroxysmal A-fib (HCC)   Acute metabolic encephalopathy   Aseptic meningitis       Hospital Course: 77 year old female with CHB s/p BiV PPM, pAF on xarelto, depression, COPD (on triple inhaler), TLIF spine surgery resulting in urinary incontinence, Graves, and stroke who was last well known per husband on Friday 08/30. Since Saturday 08/31, patient was  restless and confused. She was thrashing around and difficult to control. Last night, she continued to wet her bed (more than normal) and seemingly became more agitated. Recent (+)COVID pneumonia last month and was being treated concurrently with doxycycline and prednisone. Otherwise, she was normal up until Friday night.  09/01: to ED, had to be started on precedex gtt. CT head neg.  Consideration for LP once DOAC washout. Transfused 2 units PRBC, no s/s bleeding. Empiric tx for possible meningitis w/ vanc + cefepime + acyclovir. 09/02: neurology consult - improved but still sedated w/ precedex, rapid improvement inconsistent w/ meningitis, hold off on LP. Advise ID consult.  09/03: ID consult - concern for possible viral/aseptic meningitis, LP done and advise send CSF for meningitis PCR panel, deescalate abx.  09/04: LP (+)positive for significantly elevated WBCs (50 and 176), c/w meningitis given this and hx abruptly worsened mental status. Doubt Covid encephalopathy. CSF viral panel negative. Gram stain negative. CSF fungal/bacterial cultures pending. CSF HSV PCR was negative, can d/c acyclovir. ID recs for d/c abx - note doxycycline has been associated with aseptic meningitis presentations. Off precedex. Neuro s/o.  09/05: per ID, csf cx negative. Meningitis pcr panel negative, recs continue to monitor off of abtx. ID s/o.  09/06: transfer service to hospitalists. Pt doing well, alert, ambulatory, no fever/chills, pt requesting for discharge and felt to be medically stable to go home w/ home health      Consultants:  PCCM Neurology Infectious disease  Procedures: 04/27/23 lumbar puncture  ASSESSMENT & PLAN:     Acute encephalopathy secondary to aseptic meningitis - resolved  CT head without any intracranial acute process.  CT chest without any signs of pneumonia and UA without any signs of infectious process.  CSF studies shows aseptic meningitis, Viral PCR including HSV  negative. No signs of bacterial meningitis.  Now off abx  History of COPD on triple therapy Continue home medications   History of paroxysmal A-fib,  Restarted Xarelto   Anemia on presentation with hemoglobin 5.7 with good response to transfusion.  Monitor CBC outpatient   History of complete heart block status post dual-chamber pacemaker placement  follows at [facility]  History of spondylolisthesis of lumbar region status post lumbar fusion in 2020 complicated by urinary incontinence and peripheral neuropathy on gabapentin Follow outpatient   Hypokalemia and Cr elevation in setting of dehydration.  Improved, follow outpatient  Depression on Celexa  Continue meds as below  GERD Continue meds as below    TSH 9.120, normal T4.            Discharge Instructions  Allergies as of 04/30/2023       Reactions   Baclofen Other (See Comments)   Weakness, confusion, tremors.    Penicillins Other (See Comments)   Has patient had a PCN reaction causing immediate rash, facial/tongue/throat swelling, SOB or lightheadedness with hypotension: No Has patient had a PCN reaction causing severe rash involving mucus membranes or skin necrosis: No Has patient had a PCN reaction that required hospitalization No Has patient had a PCN reaction occurring within the last 10 years: No If all of the above answers are "NO", then may proceed with Cephalosporin use.        Medication List     STOP taking these medications    doxycycline 100 MG capsule Commonly known as: VIBRAMYCIN   pantoprazole 40 MG tablet Commonly known as: PROTONIX   predniSONE 10 MG tablet Commonly known as: DELTASONE   valsartan 160 MG tablet Commonly known as: DIOVAN       TAKE these medications    albuterol (2.5 MG/3ML) 0.083% nebulizer solution Commonly known as: PROVENTIL Take 3 mLs (2.5 mg total) by nebulization every 6 (six) hours as needed for wheezing or shortness of breath.   CENTRUM SILVER  50+WOMEN PO Take 1 tablet by mouth daily.   chlorpheniramine-HYDROcodone 10-8 MG/5ML Commonly known as: TUSSIONEX Take 5 mLs by mouth every 12 (twelve) hours as needed for cough.   citalopram 40 MG tablet Commonly known as: CELEXA TAKE 1 TABLET BY MOUTH EVERY DAY   gabapentin 300 MG capsule Commonly known as: NEURONTIN Take 2 capsules (600 mg total) by mouth 3 (three) times daily.   gemfibrozil 600 MG tablet Commonly known as: LOPID TAKE 1 TABLET BY MOUTH EVERY DAY   levothyroxine 112 MCG tablet Commonly known as: SYNTHROID Take 1 tablet (112 mcg total) by mouth daily.   loratadine 10 MG tablet Commonly known as: CLARITIN Take 1 tablet (10 mg total) by mouth daily.   metoprolol succinate 50 MG 24 hr tablet Commonly known as: TOPROL-XL Take 1 tablet (50 mg total) by mouth daily. TAKE WITH OR IMMEDIATELY FOLLOWING A MEAL.   montelukast 10 MG tablet Commonly known as: SINGULAIR TAKE 1 TABLET BY MOUTH EVERY DAY   nystatin cream Commonly known as: MYCOSTATIN APPLY TO AFFECTED AREA TWICE A DAY   omeprazole 20 MG capsule Commonly known as: PRILOSEC Take 20 mg by mouth daily.   ondansetron 4 MG tablet Commonly  known as: ZOFRAN Take 1 tablet (4 mg total) by mouth every 8 (eight) hours as needed for nausea or vomiting.   rivaroxaban 20 MG Tabs tablet Commonly known as: XARELTO Take 20 mg by mouth daily with supper. Cardiologist [facility]   simvastatin 20 MG tablet Commonly known as: ZOCOR Take 1 tablet (20 mg total) by mouth daily.   Trelegy Ellipta 100-62.5-25 MCG/ACT Aepb Generic drug: Fluticasone-Umeclidin-Vilant Inhale 1 Inhaler into the lungs daily.         Follow-up Information     Duanne Limerick, MD. Schedule an appointment as soon as possible for a visit in 1 week(s).   Specialty: Family Medicine Why: hospital follow up Contact information: [address] [place] [address] [phone]                 Allergies  Allergen  Reactions   Baclofen Other (See Comments)    Weakness, confusion, tremors.     Penicillins Other (See Comments)    Has patient had a PCN reaction causing immediate rash, facial/tongue/throat swelling, SOB or lightheadedness with hypotension: No Has patient had a PCN reaction causing severe rash involving mucus membranes or skin necrosis: No Has patient had a PCN reaction that required hospitalization No Has patient had a PCN reaction occurring within the last 10 years: No If all of the above answers are "NO", then may proceed with Cephalosporin use.      Subjective: pt feeling well this morning, no concerns, ambulating steadily but still a bit weak, tolerating diet, mental status normal per family, pt inquires about discharge home.    Discharge Exam: BP (!) 137/56 (BP Location: Right Arm)   Pulse 60   Temp 98.1 F (36.7 C)   Resp 18   Ht 5\' 6"  (1.676 m)   Wt 89.5 kg   SpO2 94%   BMI 31.85 kg/m  General: Pt is alert, awake, not in acute distress Cardiovascular: RRR, S1/S2, no rubs, no gallops Respiratory: CTA bilaterally, no wheezing, no rhonchi Abdominal: Soft, NT, ND, bowel sounds + Extremities: no edema, no cyanosis     The results of significant diagnostics from this hospitalization (including imaging, microbiology, ancillary and laboratory) are listed below for reference.     Microbiology: Recent Results (from the past 240 hour(s))  Blood Culture (routine x 2)     Status: None   Collection Time: 04/25/23  9:53 AM   Specimen: BLOOD  Result Value Ref Range Status   Specimen Description BLOOD BLOOD LEFT ARM  Final   Special Requests   Final    BOTTLES DRAWN AEROBIC AND ANAEROBIC Blood Culture adequate volume   Culture   Final    NO GROWTH 5 DAYS Performed at [facility], [address] [place], [place] [address]    Report Status 04/30/2023 FINAL  Final  Blood Culture (routine x 2)     Status: None   Collection Time: 04/25/23  9:58 AM   Specimen:  BLOOD  Result Value Ref Range Status   Specimen Description BLOOD BLOOD RIGHT ARM  Final   Special Requests   Final    BOTTLES DRAWN AEROBIC AND ANAEROBIC Blood Culture adequate volume   Culture   Final    NO GROWTH 5 DAYS Performed at [facility], [address], [place], [place] [address]    Report Status 04/30/2023 FINAL  Final  MRSA Next Gen by PCR, Nasal     Status: None   Collection Time: 04/25/23  3:18 PM   Specimen: Nasal Mucosa; Nasal Swab  Result Value Ref Range Status   MRSA by PCR Next Gen NOT DETECTED NOT DETECTED Final    Comment: (NOTE) The GeneXpert MRSA Assay (FDA approved for NASAL specimens only), is one component of a comprehensive MRSA colonization surveillance program. It is not intended to diagnose MRSA infection nor to guide or monitor treatment for MRSA infections. Test performance is not FDA approved in patients less than 4 years old. Performed at [facility], [address] [place], [place] [address]   SARS Coronavirus 2 by RT PCR (hospital order, performed in [facility] hospital lab) *cepheid single result test* Anterior Nasal Swab     Status: Abnormal   Collection Time: 04/25/23  3:18 PM   Specimen: Anterior Nasal Swab  Result Value Ref Range Status   SARS Coronavirus 2 by RT PCR POSITIVE (A) NEGATIVE Final    Comment: (NOTE) SARS-CoV-2 target nucleic acids are DETECTED  SARS-CoV-2 RNA is generally detectable in upper respiratory specimens  during the acute phase of infection.  Positive results are indicative  of the presence of the identified virus, but do not rule out bacterial infection or co-infection with other pathogens not detected by the test.  Clinical correlation with patient history and  other diagnostic information is necessary to determine patient infection status.  The expected result is negative.  Fact Sheet for Patients:   RoadLapTop.co.za   Fact Sheet for Healthcare Providers:    http://kim-miller.com/    This test is not yet approved or cleared by the [place] FDA and  has been authorized for detection and/or diagnosis of SARS-CoV-2 by FDA under an Emergency Use Authorization (EUA).  This EUA will remain in effect (meaning this test can be used) for the duration of  the COVID-19 declaration under Section 564(b)(1)  of the Act, 21 U.S.C. section 360-bbb-3(b)(1), unless the authorization is terminated or revoked sooner.   Performed at [facility], [address] [place], [place] [address]   CSF culture w Gram Stain     Status: None (Preliminary result)   Collection Time: 04/26/23  1:07 PM   Specimen: CSF; Cerebrospinal Fluid  Result Value Ref Range Status   Specimen Description   Final    CSF Performed at [facility], [address], [place], [place] [address]    Special Requests   Final    LP Performed at [facility], [address] [place], [place] [address]    Gram Stain   Final    NO ORGANISMS SEEN WBC SEEN RED BLOOD CELLS PRESENT Performed at [facility], [address], [place], [place] [address]    Culture   Final    NO GROWTH 3 DAYS Performed at [facility], [address] [place], [place] [address]    Report Status PENDING  Incomplete  Culture, fungus without smear     Status: None (Preliminary result)   Collection Time: 04/26/23  1:07 PM   Specimen: Path fluid; Cerebrospinal Fluid  Result Value Ref Range Status   Specimen Description   Final    FLUID Performed at [facility], [address] [place], [place] [address]    Special Requests   Final    LP Performed at [facility], [address] [place], [place] [address]    Culture   Final    NO FUNGUS ISOLATED AFTER 18 DAYS Performed at [facility], [address] [place], [place] [address]    Report Status PENDING  Incomplete     Labs: BNP (last 3 results) No results  for input(s): "BNP" in the last 8760 hours. Basic Metabolic Panel: Recent Labs  Lab 04/26/23 0328 04/27/23 0444 04/28/23 0342 04/29/23 0441 04/29/23 1346 04/30/23 0501  NA 140 136 135 135 133* 136  K 3.5 3.3* 3.5 2.8* 3.1* 4.4  CL 109 106 105 106 103 110  CO2 22 22 20* 20* 18* 20*  GLUCOSE 153* 144* 159* 107* 90 83  BUN 17 17 18 18 17 22   CREATININE 0.81 0.81 0.84 1.17* 1.31* 1.17*  CALCIUM 8.3* 8.2* 8.3* 8.5* 8.6* 8.5*  MG 2.0 1.9 2.1 2.1  --  2.1  PHOS 3.8 3.1 3.5 3.8  --  3.6   Liver Function Tests: Recent Labs  Lab 04/25/23 1000  AST 40  ALT 29  ALKPHOS 80  BILITOT 1.0  PROT 6.7  ALBUMIN 3.7   No results for input(s): "LIPASE", "AMYLASE" in the last 168 hours. No results for input(s): "AMMONIA" in the last 168 hours. CBC: Recent Labs  Lab 04/25/23 1000 04/25/23 2228 04/26/23 0328 04/27/23 0444 04/28/23 0342 04/29/23 0441 04/30/23 0501  WBC 11.2*  --  7.9 7.7 5.1 11.5* 10.3  NEUTROABS 9.2*  --   --   --   --   --   --   HGB 5.7*   < > 8.3* 7.9* 7.6* 8.3* 8.3*  HCT 19.1*   < > 26.2* 24.7* 23.7* 26.4* 27.6*  MCV 83.8  --  83.2 80.5 80.6 82.8 86.3  PLT 411*  --  313 266 215 242 221   < > = values in this interval not displayed.   Cardiac Enzymes: No results for input(s): "CKTOTAL", "CKMB", "CKMBINDEX", "TROPONINI" in the last 168 hours. BNP: Invalid input(s): "POCBNP" CBG: Recent Labs  Lab 04/25/23 1508 04/27/23 0727  GLUCAP 111* 146*   D-Dimer No results for input(s): "DDIMER" in the last 72 hours. Hgb A1c No results for input(s): "HGBA1C" in the last 72 hours. Lipid Profile No results for input(s): "CHOL", "HDL", "LDLCALC", "TRIG", "CHOLHDL", "LDLDIRECT" in the last 72 hours. Thyroid function studies No results for input(s): "TSH", "T4TOTAL", "T3FREE", "THYROIDAB" in the last 72 hours.  Invalid input(s): "FREET3" Anemia work up No results for input(s): "VITAMINB12", "FOLATE", "FERRITIN", "TIBC", "IRON", "RETICCTPCT" in the last 72  hours. Urinalysis    Component Value Date/Time   COLORURINE YELLOW 04/25/2023 1518   APPEARANCEUR CLEAR (A) 04/25/2023 1518   APPEARANCEUR Cloudy (A) 10/28/2018 1100   LABSPEC 1.015 04/25/2023 1518   PHURINE 5.5 04/25/2023 1518   GLUCOSEU NEGATIVE 04/25/2023 1518   HGBUR NEGATIVE 04/25/2023 1518   BILIRUBINUR NEGATIVE 04/25/2023 1518   BILIRUBINUR positive 03/17/2019 0956   BILIRUBINUR Negative 10/28/2018 1100   KETONESUR 15 (A) 04/25/2023 1518   PROTEINUR NEGATIVE 04/25/2023 1518   UROBILINOGEN 0.2 03/17/2019 0956   NITRITE NEGATIVE 04/25/2023 1518   LEUKOCYTESUR NEGATIVE 04/25/2023 1518   Sepsis Labs Recent Labs  Lab 04/27/23 0444 04/28/23 0342 04/29/23 0441 04/30/23 0501  WBC 7.7 5.1 11.5* 10.3   Microbiology Recent Results (from the past 240 hour(s))  Blood Culture (routine x 2)     Status: None   Collection Time: 04/25/23  9:53 AM   Specimen: BLOOD  Result Value Ref Range Status   Specimen Description BLOOD BLOOD LEFT ARM  Final   Special Requests   Final    BOTTLES DRAWN AEROBIC AND ANAEROBIC Blood Culture adequate  volume   Culture   Final    NO GROWTH 5 DAYS Performed at [facility], [address] [place] [place], [place] [address]    Report Status 04/30/2023 FINAL  Final  Blood Culture (routine x 2)     Status: None   Collection Time: 04/25/23  9:58 AM   Specimen: BLOOD  Result Value Ref Range Status   Specimen Description BLOOD BLOOD RIGHT ARM  Final   Special Requests   Final    BOTTLES DRAWN AEROBIC AND ANAEROBIC Blood Culture adequate volume   Culture   Final    NO GROWTH 5 DAYS Performed at [facility], [address] [place], [place] [address]    Report Status 04/30/2023 FINAL  Final  MRSA Next Gen by PCR, Nasal     Status: None   Collection Time: 04/25/23  3:18 PM   Specimen: Nasal Mucosa; Nasal Swab  Result Value Ref Range Status   MRSA by PCR Next Gen NOT DETECTED NOT DETECTED Final    Comment: (NOTE) The GeneXpert MRSA  Assay (FDA approved for NASAL specimens only), is one component of a comprehensive MRSA colonization surveillance program. It is not intended to diagnose MRSA infection nor to guide or monitor treatment for MRSA infections. Test performance is not FDA approved in patients less than 38 years old. Performed at [facility], [address] [place], [place] [address]   SARS Coronavirus 2 by RT PCR (hospital order, performed in [facility] hospital lab) *cepheid single result test* Anterior Nasal Swab     Status: Abnormal   Collection Time: 04/25/23  3:18 PM   Specimen: Anterior Nasal Swab  Result Value Ref Range Status   SARS Coronavirus 2 by RT PCR POSITIVE (A) NEGATIVE Final    Comment: (NOTE) SARS-CoV-2 target nucleic acids are DETECTED  SARS-CoV-2 RNA is generally detectable in upper respiratory specimens  during the acute phase of infection.  Positive results are indicative  of the presence of the identified virus, but do not rule out bacterial infection or co-infection with other pathogens not detected by the test.  Clinical correlation with patient history and  other diagnostic information is necessary to determine patient infection status.  The expected result is negative.  Fact Sheet for Patients:   RoadLapTop.co.za   Fact Sheet for Healthcare Providers:   http://kim-miller.com/    This test is not yet approved or cleared by the [place] FDA and  has been authorized for detection and/or diagnosis of SARS-CoV-2 by FDA under an Emergency Use Authorization (EUA).  This EUA will remain in effect (meaning this test can be used) for the duration of  the COVID-19 declaration under Section 564(b)(1)  of the Act, 21 U.S.C. section 360-bbb-3(b)(1), unless the authorization is terminated or revoked sooner.   Performed at [facility], [address] [place], [place] [address]   CSF culture w Gram Stain      Status: None (Preliminary result)   Collection Time: 04/26/23  1:07 PM   Specimen: CSF; Cerebrospinal Fluid  Result Value Ref Range Status   Specimen Description   Final    CSF Performed at [facility], [address] [place], [place] [address]    Special Requests   Final    LP Performed at [facility], [address] [place], [place] [address]    Gram Stain   Final    NO ORGANISMS SEEN WBC SEEN RED BLOOD CELLS PRESENT Performed at [facility],  [address] [place], [place] [address]    Culture   Final    NO GROWTH 3 DAYS Performed at [facility], [address], [place], [place] [address]    Report Status PENDING  Incomplete  Culture, fungus without smear     Status: None (Preliminary result)   Collection Time: 04/26/23  1:07 PM   Specimen: Path fluid; Cerebrospinal Fluid  Result Value Ref Range Status   Specimen Description   Final    FLUID Performed at [facility], [address], [place], [place] [address]    Special Requests   Final    LP Performed at [facility], [address], [place], [place] [address]    Culture   Final    NO FUNGUS ISOLATED AFTER 18 DAYS Performed at [facility], [address] [place], [place] [address]    Report Status PENDING  Incomplete   Imaging CT Head Wo Contrast  Result Date: 04/25/2023 CLINICAL DATA:  Mental status change, unknown cause. EXAM: CT HEAD WITHOUT CONTRAST TECHNIQUE: Contiguous axial images were obtained from the base of the skull through the vertex without intravenous contrast. RADIATION DOSE REDUCTION: This exam was performed according to the departmental dose-optimization program which includes automated exposure control, adjustment of the mA and/or kV according to patient size and/or use of iterative reconstruction technique. COMPARISON:  CT head 03/11/2016. FINDINGS: Brain: Remote left frontal infarct. No evidence of acute large vascular territory  infarct, acute hemorrhage, mass lesion, midline shift or hydrocephalus. Vascular: No hyperdense vessel identified. Skull: No acute fracture no evidence. Sinuses/Orbits: Bilateral maxillary and right sphenoid sinus air-fluid levels with scattered paranasal sinus mucosal thickening. No acute orbital findings. Other: No mastoid effusions. IMPRESSION: 1. No evidence of acute intracranial abnormality. 2. Paranasal sinus disease, detailed above. Electronically Signed   By: Feliberto Harts M.D.   On: 04/25/2023 13:42   CT CHEST ABDOMEN PELVIS W CONTRAST  Result Date: 04/25/2023 CLINICAL DATA:  COVID 2 weeks ago. Pneumonia. Concern for sepsis. Confusion. Tachypneic. EXAM: CT CHEST, ABDOMEN, AND PELVIS WITH CONTRAST TECHNIQUE: Multidetector CT imaging of the chest, abdomen and pelvis was performed following the standard protocol during bolus administration of intravenous contrast. RADIATION DOSE REDUCTION: This exam was performed according to the departmental dose-optimization program which includes automated exposure control, adjustment of the mA and/or kV according to patient size and/or use of iterative reconstruction technique. CONTRAST:  OMNIPAQUE IOHEXOL 300 MG/ML  SOLN COMPARISON:  None Available. FINDINGS: CT CHEST FINDINGS CT CHEST FINDINGS Cardiovascular: No significant vascular findings. Normal heart size. No pericardial effusion. Mediastinum/Nodes: No axillary or supraclavicular adenopathy. No mediastinal or hilar adenopathy. No pericardial fluid. Esophagus normal. Lungs/Pleura: No pneumonia. Mild basilar atelectasis. Mild centrilobular emphysema pattern in the upper lobes. There is respiratory motion which does degrade evaluation of the lungs. Musculoskeletal: No aggressive osseous lesion. CT ABDOMEN AND PELVIS FINDINGS Hepatobiliary: No focal hepatic lesion. No biliary ductal dilatation. Gallbladder is normal. Common bile duct is normal. Pancreas: Pancreas is normal. No ductal dilatation. No  pancreatic inflammation. Spleen: Normal spleen Adrenals/urinary tract: Adrenal glands and kidneys are normal. The ureters and bladder normal. Stomach/Bowel: Stomach, small bowel, appendix, and cecum are normal. The colon and rectosigmoid colon are normal. Vascular/Lymphatic: Abdominal aorta is normal caliber with atherosclerotic calcification. There is no retroperitoneal or periportal lymphadenopathy. No pelvic lymphadenopathy. Reproductive: Post hysterectomy.  Adnexa unremarkable Other: No free fluid. Musculoskeletal: Severe degenerative change of the lumbar spine. Posterior lumbar fusion at L4-L5. No  evidence of bone infection or inflammation. IMPRESSION: CHEST: 1. No evidence of pneumonia. 2. Mild basilar atelectasis. PELVIS: No acute findings in the abdomen pelvis. Aortic Atherosclerosis (ICD10-I70.0) and Emphysema (ICD10-J43.9). Electronically Signed   By: Genevive Bi M.D.   On: 04/25/2023 13:24   DG Chest Port 1 View  Result Date: 04/25/2023 CLINICAL DATA:  Possible sepsis. COVID 2 weeks ago. Pneumonia since last night EXAM: PORTABLE CHEST 1 VIEW COMPARISON:  08/31/2022 FINDINGS: Pacer again identified. Numerous leads and wires project over the chest. Patient rotated right. Midline trachea. Mild cardiomegaly. No right pleural effusion or pneumothorax. The left costophrenic angle is poorly evaluated secondary to overlying soft tissues in this obese patient. No congestive failure. Suboptimal evaluation of the left lung base secondary to overlying breast tissues. Otherwise, no lobar consolidation. IMPRESSION: Cardiomegaly, without congestive failure. Suboptimal evaluation of the inferior left hemithorax secondary to AP portable technique and patient body habitus. No convincing evidence of acute process. If concern of left lower lobe airspace disease or pleural fluid, recommend PA and lateral radiographs. Electronically Signed   By: Jeronimo Greaves M.D.   On: 04/25/2023 10:57      Time coordinating  discharge: over 30 minutes  SIGNED:  Sunnie Nielsen DO [facility]

## 2023-04-30 NOTE — Hospital Course (Signed)
77 year old female with CHB s/p BiV PPM, pAF on xarelto, depression, COPD (on triple inhaler), TLIF spine surgery resulting in urinary incontinence, Graves, and stroke who was last well known per husband on Friday 08/30. Since Saturday 08/31, patient was restless and confused. She was thrashing around and difficult to control. Last night, she continued to wet her bed (more than normal) and seemingly became more agitated. Recent (+)COVID pneumonia last month and was being treated concurrently with doxycycline and prednisone. Otherwise, she was normal up until Friday night.  09/01: to ED, had to be started on precedex gtt. CT head neg.  Consideration for LP once DOAC washout. Transfused 2 units PRBC, no s/s bleeding. Empiric tx for possible meningitis w/ vanc + cefepime + acyclovir. 09/02: neurology consult - improved but still sedated w/ precedex, rapid improvement inconsistent w/ meningitis, hold off on LP. Advise ID consult.  09/03: ID consult - concern for possible viral/aseptic meningitis, LP done and advise send CSF for meningitis PCR panel, deescalate abx.  09/04: LP (+)positive for significantly elevated WBCs (50 and 176), c/w meningitis given this and hx abruptly worsened mental status. Doubt Covid encephalopathy. CSF viral panel negative. Gram stain negative. CSF fungal/bacterial cultures pending. CSF HSV PCR was negative, can d/c acyclovir. ID recs for d/c abx - note doxycycline has been associated with aseptic meningitis presentations. Off precedex. Neuro s/o.  09/05: per ID, csf cx negative. Meningitis pcr panel negative, recs continue to monitor off of abtx. ID s/o.  09/06: transfer service to hospitalists.      Consultants:  PCCM Neurology Infectious disease  Procedures: 04/27/23 lumbar puncture       ASSESSMENT & PLAN:   Principal Problem:   Altered mental status Active Problems:   Acute metabolic encephalopathy   Aseptic meningitis  Acute encephalopathy secondary to  aseptic meningitis.   CT head without any intracranial acute process.  CT chest without any signs of pneumonia and UA without any signs of infectious process.  CSF studies shows aseptic meningitis, Viral PCR including HSV negative. No signs of bacterial meningitis.  Now off abx  History of COPD on triple therapy  History of paroxysmal A-fib, on Xarelto on hold.  Anemia on presentation with hemoglobin 5.7 with good response to transfusion.  Unclear if underlying GI bleed will consider GI consult once mental status improves.  History of complete heart block status post dual-chamber pacemaker placement follows at [facility].  History of spondylolisthesis of lumbar region status post lumbar fusion in 2020 complicated by urinary incontinence and peripheral neuropathy on gabapentin  Hypokalemia and Cr elevation in setting of dehydration.   Depression on Celexa   GERD     TSH 9.120, normal T4.    *** based on BMI: Body mass index is 31.85 kg/m.    DVT prophylaxis: *** IV fluids: *** continuous IV fluids  Nutrition: *** Central lines / invasive devices: ***  Code Status: *** ACP documentation reviewed: *** none on file in [facility]  Current Admission Status: ***  LOS: 5 TOC needs: *** Barriers to discharge / significant pending items: ***

## 2023-04-30 NOTE — TOC Transition Note (Signed)
[facility]) - CM/SW Discharge Note   Patient Details  Name: Danielle Taylor MRN: [id number] Date of Birth: 25-Jan-1946  [facility]) CM/SW Contact:  Liliana Cline, LCSW Phone Number: 04/30/2023, 11:56 AM   Clinical Narrative:    Patient has orders to DC home today. Notified [place] with [facility]. They will follow for PT and OT.   Final next level of care: Home w Home Health Services Barriers to Discharge: Barriers Resolved   Patient Goals and CMS Choice CMS Medicare.gov Compare Post Acute Care list provided to:: Patient Choice offered to / list presented to : Patient  Discharge Placement                         Discharge Plan and Services Additional resources added to the After Visit Summary for       Post Acute Care Choice: Home Health                    HH Arranged: PT, OT [facility] Agency: [facility] Date [facility] Agency Contacted: 04/30/23   Representative spoke with at [facility] Agency: [place]  Social Determinants of Health (SDOH) Interventions SDOH Screenings   Food Insecurity: No Food Insecurity (11/26/2022)  Housing: Low Risk  (11/26/2022)  Transportation Needs: No Transportation Needs (11/26/2022)  Utilities: Not At Risk (11/26/2022)  Alcohol Screen: Low Risk  (11/26/2022)  Depression (PHQ2-9): Low Risk  (11/26/2022)  Financial Resource Strain: Low Risk  (11/26/2022)  Physical Activity: Inactive (11/26/2022)  Social Connections: Moderately Integrated (11/26/2022)  Stress: No Stress Concern Present (11/26/2022)  Tobacco Use: Medium Risk (04/25/2023)     Readmission Risk Interventions     No data to display

## 2023-04-30 NOTE — Progress Notes (Signed)
OT Cancellation Note  Patient Details Name: Danielle Taylor MRN: [id number] DOB: 02/28/1946   Cancelled Treatment:    Reason Eval/Treat Not Completed: Patient declined, no reason specified. On first attempt this morning pt with leader rounding team member. On 2nd attempt pt politely declining OT participation due to discharging soon.  Arman Filter., MPH, MS, OTR/L ascom [phone] 04/30/23, 11:43 AM

## 2023-05-01 LAB — CSF CULTURE W GRAM STAIN
Culture: NO GROWTH
Gram Stain: NONE SEEN

## 2023-05-02 DIAGNOSIS — I10 Essential (primary) hypertension: Secondary | ICD-10-CM | POA: Diagnosis not present

## 2023-05-02 DIAGNOSIS — E876 Hypokalemia: Secondary | ICD-10-CM | POA: Diagnosis not present

## 2023-05-02 DIAGNOSIS — E039 Hypothyroidism, unspecified: Secondary | ICD-10-CM | POA: Diagnosis not present

## 2023-05-02 DIAGNOSIS — R4182 Altered mental status, unspecified: Secondary | ICD-10-CM | POA: Diagnosis not present

## 2023-05-02 DIAGNOSIS — E0520 Thyrotoxicosis with toxic multinodular goiter without thyrotoxic crisis or storm: Secondary | ICD-10-CM | POA: Diagnosis not present

## 2023-05-02 DIAGNOSIS — I2510 Atherosclerotic heart disease of native coronary artery without angina pectoris: Secondary | ICD-10-CM | POA: Diagnosis not present

## 2023-05-02 DIAGNOSIS — E7801 Familial hypercholesterolemia: Secondary | ICD-10-CM | POA: Diagnosis not present

## 2023-05-02 DIAGNOSIS — E782 Mixed hyperlipidemia: Secondary | ICD-10-CM | POA: Diagnosis not present

## 2023-05-02 DIAGNOSIS — M720 Palmar fascial fibromatosis [Dupuytren]: Secondary | ICD-10-CM | POA: Diagnosis not present

## 2023-05-02 DIAGNOSIS — M5116 Intervertebral disc disorders with radiculopathy, lumbar region: Secondary | ICD-10-CM | POA: Diagnosis not present

## 2023-05-02 DIAGNOSIS — D649 Anemia, unspecified: Secondary | ICD-10-CM | POA: Diagnosis not present

## 2023-05-02 DIAGNOSIS — J849 Interstitial pulmonary disease, unspecified: Secondary | ICD-10-CM | POA: Diagnosis not present

## 2023-05-02 DIAGNOSIS — I480 Paroxysmal atrial fibrillation: Secondary | ICD-10-CM | POA: Diagnosis not present

## 2023-05-02 DIAGNOSIS — J432 Centrilobular emphysema: Secondary | ICD-10-CM | POA: Diagnosis not present

## 2023-05-02 DIAGNOSIS — R32 Unspecified urinary incontinence: Secondary | ICD-10-CM | POA: Diagnosis not present

## 2023-05-02 DIAGNOSIS — J4489 Other specified chronic obstructive pulmonary disease: Secondary | ICD-10-CM | POA: Diagnosis not present

## 2023-05-02 DIAGNOSIS — J4520 Mild intermittent asthma, uncomplicated: Secondary | ICD-10-CM | POA: Diagnosis not present

## 2023-05-02 DIAGNOSIS — M4316 Spondylolisthesis, lumbar region: Secondary | ICD-10-CM | POA: Diagnosis not present

## 2023-05-02 DIAGNOSIS — E860 Dehydration: Secondary | ICD-10-CM | POA: Diagnosis not present

## 2023-05-02 DIAGNOSIS — K219 Gastro-esophageal reflux disease without esophagitis: Secondary | ICD-10-CM | POA: Diagnosis not present

## 2023-05-02 DIAGNOSIS — G8929 Other chronic pain: Secondary | ICD-10-CM | POA: Diagnosis not present

## 2023-05-02 DIAGNOSIS — M5440 Lumbago with sciatica, unspecified side: Secondary | ICD-10-CM | POA: Diagnosis not present

## 2023-05-02 DIAGNOSIS — F32A Depression, unspecified: Secondary | ICD-10-CM | POA: Diagnosis not present

## 2023-05-02 DIAGNOSIS — K649 Unspecified hemorrhoids: Secondary | ICD-10-CM | POA: Diagnosis not present

## 2023-05-02 DIAGNOSIS — M1712 Unilateral primary osteoarthritis, left knee: Secondary | ICD-10-CM | POA: Diagnosis not present

## 2023-05-02 LAB — CULTURE, FUNGUS WITHOUT SMEAR

## 2023-05-03 ENCOUNTER — Telehealth: Payer: Self-pay

## 2023-05-03 NOTE — Group Note (Deleted)

## 2023-05-03 NOTE — Transitions of Care (Post Inpatient/ED Visit) (Signed)
   05/03/2023  Name: Danielle Taylor MRN: [id number] DOB: 08-04-1946  Today's TOC FU Call Status: Today's TOC FU Call Status:: Unsuccessful Call (1st Attempt) Unsuccessful Call (1st Attempt) Date: 05/03/23  Attempted to reach the patient regarding the most recent Inpatient/ED visit.  Follow Up Plan: Additional outreach attempts will be made to reach the patient to complete the Transitions of Care (Post Inpatient/ED visit) call.     Antionette Fairy, RN,BSN,CCM [facility] Care Management Care Management Community Coordinator Direct Phone: [phone] Toll Free: [phone] Fax: [phone]

## 2023-05-04 ENCOUNTER — Telehealth: Payer: Self-pay

## 2023-05-04 ENCOUNTER — Telehealth: Payer: Self-pay | Admitting: Family Medicine

## 2023-05-04 NOTE — Telephone Encounter (Signed)
Called Danielle Taylor gave her verbal orders. She verbalized understanding.  KP

## 2023-05-04 NOTE — Transitions of Care (Post Inpatient/ED Visit) (Signed)
05/04/2023  Name: Danielle Taylor MRN: [id number] DOB: 03-Jun-1946  Today's TOC FU Call Status: Today's TOC FU Call Status:: Successful TOC FU Call Completed TOC FU Call Complete Date: 05/04/23 Patient's Name and Date of Birth confirmed.  Transition Care Management Follow-up Telephone Call Date of Discharge: 04/30/23 Discharge Facility: [facility]) Type of Discharge: Inpatient Admission Primary Inpatient Discharge Diagnosis:: "AMS" How have you been since you were released from the hospital?: Better (Spouse voices pt is doing much better-eating and sleeping well. She has been working with therapy and doing exercises.) Any questions or concerns?: No  Items Reviewed: Did you receive and understand the discharge instructions provided?: Yes Medications obtained,verified, and reconciled?: Yes (Medications Reviewed) Any new allergies since your discharge?: No Dietary orders reviewed?: Yes Type of Diet Ordered:: low salt/heart healthy Do you have support at home?: Yes People in Home: spouse Name of Support/Comfort Primary Source: John  Medications Reviewed Today: Medications Reviewed Today     Reviewed by Charlyn Minerva, RN (Registered Nurse) on 05/04/23 at 1030  Med List Status: <None>   Medication Order Taking? Sig Documenting Provider Last Dose Status Informant  albuterol (PROVENTIL) (2.5 MG/3ML) 0.083% nebulizer solution [id number] Yes Take 3 mLs (2.5 mg total) by nebulization every 6 (six) hours as needed for wheezing or shortness of breath. Duanne Limerick, MD Taking Active Family Member  chlorpheniramine-HYDROcodone (TUSSIONEX) 10-8 MG/5ML [id number] Yes Take 5 mLs by mouth every 12 (twelve) hours as needed for cough. [provider] Taking Active Family Member  citalopram (CELEXA) 40 MG tablet [id number] Yes TAKE 1 TABLET BY MOUTH EVERY DAY Duanne Limerick, MD Taking Active   Fluticasone-Umeclidin-Vilant (TRELEGY ELLIPTA)  100-62.5-25 MCG/ACT AEPB [id number] Yes Inhale 1 Inhaler into the lungs daily. [provider] Taking Active Family Member  gabapentin (NEURONTIN) 300 MG capsule [id number] Yes Take 2 capsules (600 mg total) by mouth 3 (three) times daily. Venetia Night, MD Taking Active Family Member  gemfibrozil (LOPID) 600 MG tablet [id number] Yes TAKE 1 TABLET BY MOUTH EVERY DAY Duanne Limerick, MD Taking Active   levothyroxine (SYNTHROID) 112 MCG tablet [id number] Yes Take 1 tablet (112 mcg total) by mouth daily. Duanne Limerick, MD Taking Active Family Member  loratadine (CLARITIN) 10 MG tablet [id number] Yes Take 1 tablet (10 mg total) by mouth daily. Duanne Limerick, MD Taking Active Family Member  metoprolol succinate (TOPROL-XL) 50 MG 24 hr tablet [id number] Yes Take 1 tablet (50 mg total) by mouth daily. TAKE WITH OR IMMEDIATELY FOLLOWING A MEAL. Duanne Limerick, MD Taking Active Family Member  montelukast (SINGULAIR) 10 MG tablet [id number] Yes TAKE 1 TABLET BY MOUTH EVERY DAY Duanne Limerick, MD Taking Active Family Member  Multiple Vitamins-Minerals (CENTRUM SILVER 50+WOMEN PO) [id number] Yes Take 1 tablet by mouth daily. [provider] Taking Active Family Member  nystatin cream (MYCOSTATIN) [id number] Yes APPLY TO AFFECTED AREA TWICE A DAY Duanne Limerick, MD Taking Active Family Member  omeprazole (PRILOSEC) 20 MG capsule [id number] Yes Take 20 mg by mouth daily. [provider] Taking Active Family Member  ondansetron (ZOFRAN) 4 MG tablet [id number] Yes Take 1 tablet (4 mg total) by mouth every 8 (eight) hours as needed for nausea or vomiting. Duanne Limerick, MD Taking Active Family Member  rivaroxaban (XARELTO) 20 MG TABS tablet [id number] Yes Take 20 mg by mouth daily with supper. Cardiologist [facility] [provider] Taking Active Family Member  simvastatin (ZOCOR) 20 MG tablet [id number] Yes Take  1 tablet (20 mg total) by mouth daily. Duanne Limerick, MD Taking Active  Family Member            Home Care and Equipment/Supplies: Were Home Health Services Ordered?: Yes Name of Home Health Agency:: [facility] Has Agency set up a time to come to your home?: Yes First Home Health Visit Date: 05/03/23 Any new equipment or medical supplies ordered?: NA  Functional Questionnaire: Do you need assistance with bathing/showering or dressing?: Yes Do you need assistance with meal preparation?: Yes Do you need assistance with eating?: No Do you have difficulty maintaining continence: Yes (hx of urinary incontinence) Do you need assistance with getting out of bed/getting out of a chair/moving?: Yes Do you have difficulty managing or taking your medications?: Yes  Follow up appointments reviewed: PCP Follow-up appointment confirmed?: Yes Date of PCP follow-up appointment?: 05/10/23 Follow-up Provider: Dr. Yetta Barre Specialist [facility] Follow-up appointment confirmed?: NA Do you need transportation to your follow-up appointment?: No Do you understand care options if your condition(s) worsen?: Yes-patient verbalized understanding  SDOH Interventions Today    Flowsheet Row Most Recent Value  SDOH Interventions   Food Insecurity Interventions Intervention Not Indicated  Transportation Interventions Intervention Not Indicated      TOC Interventions Today    Flowsheet Row Most Recent Value  TOC Interventions   TOC Interventions Discussed/Reviewed TOC Interventions Discussed      Interventions Today    Flowsheet Row Most Recent Value  Chronic Disease   Chronic disease during today's visit Hypertension (HTN), Atrial Fibrillation (AFib)  General Interventions   General Interventions Discussed/Reviewed General Interventions Discussed, Durable Medical Equipment (DME), Doctor Visits  Doctor Visits Discussed/Reviewed Doctor Visits Discussed, PCP, Specialist  Durable Medical Equipment (DME) BP Cuff  [spouse voices they have BP machine in the home-has not  checked BP-discussed importance of BP monitoring-instructed to check BP daily, record readings and take BP log to MD appt for MD to review-spouse voiced understanding]  PCP/Specialist Visits Compliance with follow-up visit  Education Interventions   Education Provided Provided Education  Provided Verbal Education On Nutrition, Medication, When to see the doctor  Nutrition Interventions   Nutrition Discussed/Reviewed Nutrition Discussed, Decreasing salt, Fluid intake, Decreasing sugar intake, Increasing proteins, Decreasing fats, Adding fruits and vegetables  Pharmacy Interventions   Pharmacy Dicussed/Reviewed Pharmacy Topics Discussed, Medications and their functions  Safety Interventions   Safety Discussed/Reviewed Safety Discussed, Home Safety        Alessandra Grout [facility] Care Management Care Management Community Coordinator Direct Phone: [phone] Toll Free: [phone] Fax: [phone]

## 2023-05-04 NOTE — Telephone Encounter (Signed)
Copied from CRM [id number]. Topic: General - Other >> May 04, 2023 10:20 AM Epimenio Foot F wrote: Reason for CRM: Home Health Verbal Orders - Caller/Agency: [facility]  Callback Number: [phone] Requesting OT/PT/Skilled Nursing/Social Work/Speech Therapy: PT  Frequency: 2 Week 2 and 1 Week 6

## 2023-05-05 ENCOUNTER — Telehealth: Payer: Self-pay | Admitting: Family Medicine

## 2023-05-05 NOTE — Telephone Encounter (Signed)
Copied from CRM [id number]. Topic: General - Inquiry >> May 05, 2023  4:24 PM Patsy Lager T wrote: Reason for CRM: Byrd Hesselbach called from [facility] to see what diagnose code can be used for physcal therapy. Please f/u with Byrd Hesselbach

## 2023-05-07 ENCOUNTER — Telehealth: Payer: Self-pay | Admitting: Family Medicine

## 2023-05-07 DIAGNOSIS — I480 Paroxysmal atrial fibrillation: Secondary | ICD-10-CM | POA: Diagnosis not present

## 2023-05-07 DIAGNOSIS — E0520 Thyrotoxicosis with toxic multinodular goiter without thyrotoxic crisis or storm: Secondary | ICD-10-CM | POA: Diagnosis not present

## 2023-05-07 DIAGNOSIS — J4520 Mild intermittent asthma, uncomplicated: Secondary | ICD-10-CM | POA: Diagnosis not present

## 2023-05-07 DIAGNOSIS — J4489 Other specified chronic obstructive pulmonary disease: Secondary | ICD-10-CM | POA: Diagnosis not present

## 2023-05-07 DIAGNOSIS — J849 Interstitial pulmonary disease, unspecified: Secondary | ICD-10-CM | POA: Diagnosis not present

## 2023-05-07 DIAGNOSIS — J432 Centrilobular emphysema: Secondary | ICD-10-CM | POA: Diagnosis not present

## 2023-05-07 NOTE — Telephone Encounter (Signed)
Copied from CRM [id number]. Topic: General - Inquiry >> May 07, 2023  3:12 PM Marlow Baars wrote: Reason for CRM: [facility] called in neeeding a new or better diagnoses than aseptic meningitis for the patients home health physical therapy. Please assist further

## 2023-05-07 NOTE — Telephone Encounter (Signed)
I don't see referral and I don't see a DX code for PT I can use, please advise

## 2023-05-10 ENCOUNTER — Telehealth: Payer: Self-pay

## 2023-05-10 ENCOUNTER — Ambulatory Visit: Payer: Medicare Other | Admitting: Family Medicine

## 2023-05-10 ENCOUNTER — Encounter: Payer: Self-pay | Admitting: Family Medicine

## 2023-05-10 VITALS — BP 124/76 | HR 84 | Ht 66.0 in | Wt 181.0 lb

## 2023-05-10 DIAGNOSIS — R404 Transient alteration of awareness: Secondary | ICD-10-CM

## 2023-05-10 DIAGNOSIS — Z23 Encounter for immunization: Secondary | ICD-10-CM

## 2023-05-10 NOTE — Telephone Encounter (Signed)
Spoke to [address] at [employer]- pt does not wish to proceed with PT and or OT

## 2023-05-10 NOTE — Progress Notes (Signed)
Date:  05/10/2023   Name:  Danielle Taylor   DOB:  1946/08/20   MRN:  [id number]   Chief Complaint: Hospitalization Follow-up (Pt was in hospital on 04/25/23- dc on 04/30/23. TOC placed on 9/10. [facility] is wanting the ok to proceed with PT and OT. Pt and husband do not think she needs PT or OT) and Flu Vaccine  Follow up Hospitalization  Patient was admitted to [facility] on 9/1 and discharged on 9/6. She was treated for aseptic meningitis. Treatment for this included transfusion/. Telephone follow up was done on 9/10 She reports excellent compliance with treatment. She reports this condition is improved.  ----------------------------------------------------------------------------------------- -       Lab Results  Component Value Date   NA 136 04/30/2023   K 4.4 04/30/2023   CO2 20 (L) 04/30/2023   GLUCOSE 83 04/30/2023   BUN 22 04/30/2023   CREATININE 1.17 (H) 04/30/2023   CALCIUM 8.5 (L) 04/30/2023   EGFR 82 10/28/2022   GFRNONAA 49 (L) 04/30/2023   Lab Results  Component Value Date   CHOL 152 10/28/2022   HDL 50 10/28/2022   LDLCALC 86 10/28/2022   TRIG 87 10/28/2022   CHOLHDL 3.5 02/08/2017   Lab Results  Component Value Date   TSH 9.120 (H) 04/25/2023   Lab Results  Component Value Date   HGBA1C 6.3 (H) 03/11/2016   Lab Results  Component Value Date   WBC 10.3 04/30/2023   HGB 8.3 (L) 04/30/2023   HCT 27.6 (L) 04/30/2023   MCV 86.3 04/30/2023   PLT 221 04/30/2023   Lab Results  Component Value Date   ALT 29 04/25/2023   AST 40 04/25/2023   ALKPHOS 80 04/25/2023   BILITOT 1.0 04/25/2023   Lab Results  Component Value Date   VD25OH 37.43 04/27/2023     Review of Systems  Constitutional:  Negative for chills, fatigue and fever.  HENT:  Negative for drooling, ear discharge, ear pain and sore throat.   Respiratory:  Negative for cough, shortness of breath and wheezing.   Cardiovascular:  Negative for chest pain, palpitations  and leg swelling.  Gastrointestinal:  Negative for abdominal pain, blood in stool, constipation, diarrhea and nausea.  Endocrine: Negative for polydipsia.  Genitourinary:  Negative for dysuria, frequency, hematuria and urgency.  Musculoskeletal:  Negative for back pain, myalgias and neck pain.  Skin:  Negative for rash.  Allergic/Immunologic: Negative for environmental allergies.  Neurological:  Negative for dizziness and headaches.  Hematological:  Bruises/bleeds easily.  Psychiatric/Behavioral:  Negative for suicidal ideas. The patient is not nervous/anxious.     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   Lumbar radiculopathy 06/29/2018   Chronic low back pain with sciatica 02/15/2018   Reactive airway disease, mild intermittent, uncomplicated 08/10/2017   Chronic seasonal allergic rhinitis due to pollen 08/10/2017   Mixed hyperlipidemia 08/10/2017   Depression 08/10/2017   Class 1 obesity due to excess calories without serious comorbidity with body mass index (BMI) of 34.0 to 34.9 in adult 08/10/2017   Chronic cough  06/15/2017   Interstitial lung disease (HCC) 06/15/2017   Centrilobular emphysema (HCC) 04/02/2017   Lumbar disc disease 04/02/2017   Hepatic steatosis 04/02/2017   Atherosclerosis of coronary artery of native heart 04/02/2017   Cardiac pacemaker in situ 01/06/2017   Paroxysmal A-fib (HCC) 03/30/2016   Right leg weakness 03/11/2016   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     Penicillins Other (See Comments)    Has patient had a PCN reaction causing immediate rash, facial/tongue/throat swelling, SOB or lightheadedness with hypotension: No Has patient had a PCN reaction causing severe rash involving mucus membranes or skin necrosis: No Has patient had a PCN reaction that required hospitalization No Has patient had a PCN reaction occurring within the last 10 years: No If all of the above answers are "NO", then may proceed with Cephalosporin use.     Past Surgical History:  Procedure Laterality Date   APPENDECTOMY     COLONOSCOPY  08/24/1998   COLONOSCOPY WITH PROPOFOL N/A 04/26/2015   Procedure: COLONOSCOPY WITH PROPOFOL;  Surgeon: Wallace Cullens, MD;  Location: [facility] ENDOSCOPY;  Service: Gastroenterology;  Laterality: N/A;   COLONOSCOPY WITH PROPOFOL N/A 07/14/2021   Procedure: COLONOSCOPY WITH PROPOFOL;  Surgeon: Jaynie Collins, DO;  Location: [facility] ENDOSCOPY;  Service: Gastroenterology;  Laterality: N/A;   EYE SURGERY     fibroid tumors     fingers and wrist   FOOT SURGERY     IR LUMBAR PUNCTURE  04/27/2023   TONSILLECTOMY     TRANSFORAMINAL LUMBAR INTERBODY FUSION (TLIF) WITH PEDICLE SCREW FIXATION 1 LEVEL N/A 06/29/2018   Procedure: TRANSFORAMINAL LUMBAR INTERBODY FUSION (TLIF) WITH PEDICLE SCREW FIXATION 1 LEVEL;  Surgeon: Venetia Night, MD;  Location: [facility] ORS;  Service:  Neurosurgery;  Laterality: N/A;   VAGINAL HYSTERECTOMY      Social History   Tobacco Use   Smoking status: Former    Current packs/day: 0.00    Average packs/day: 0.5 packs/day for 33.0 years (16.5 ttl pk-yrs)    Types: Cigarettes    Start date: 36    Quit date: 2003    Years since quitting: 21.7   Smokeless tobacco: Never   Tobacco comments:    smoking cessation materials not required  Vaping Use   Vaping status: Never Used  Substance Use Topics   Alcohol use: Yes    Alcohol/week: 1.0 standard drink of alcohol    Types: 1 Glasses of wine per week    Comment: occassional   Drug use: Never     Medication list has been reviewed and updated.  Current Meds  Medication Sig   albuterol (PROVENTIL) (2.5 MG/3ML) 0.083% nebulizer solution Take 3 mLs (2.5 mg total) by nebulization every 6 (six) hours as needed for wheezing or shortness of breath.   citalopram (CELEXA) 40 MG tablet TAKE 1 TABLET BY MOUTH EVERY DAY   Fluticasone-Umeclidin-Vilant (TRELEGY ELLIPTA) 100-62.5-25 MCG/ACT AEPB Inhale 1 Inhaler into the lungs daily.   gabapentin (NEURONTIN) 300 MG capsule Take 2  capsules (600 mg total) by mouth 3 (three) times daily.   gemfibrozil (LOPID) 600 MG tablet TAKE 1 TABLET BY MOUTH EVERY DAY   levothyroxine (SYNTHROID) 112 MCG tablet Take 1 tablet (112 mcg total) by mouth daily.   loratadine (CLARITIN) 10 MG tablet Take 1 tablet (10 mg total) by mouth daily.   metoprolol succinate (TOPROL-XL) 50 MG 24 hr tablet Take 1 tablet (50 mg total) by mouth daily. TAKE WITH OR IMMEDIATELY FOLLOWING A MEAL.   montelukast (SINGULAIR) 10 MG tablet TAKE 1 TABLET BY MOUTH EVERY DAY   Multiple Vitamins-Minerals (CENTRUM SILVER 50+WOMEN PO) Take 1 tablet by mouth daily.   nystatin cream (MYCOSTATIN) APPLY TO AFFECTED AREA TWICE A DAY   omeprazole (PRILOSEC) 20 MG capsule Take 20 mg by mouth daily.   rivaroxaban (XARELTO) 20 MG TABS tablet Take 20 mg by mouth daily with supper. Cardiologist [facility]    simvastatin (ZOCOR) 20 MG tablet Take 1 tablet (20 mg total) by mouth daily.       05/10/2023   10:27 AM 11/25/2022   10:23 AM 10/28/2022   10:12 AM 09/10/2022    3:21 PM  GAD 7 : Generalized Anxiety Score  Nervous, Anxious, on Edge 0 0 0 0  Control/stop worrying 0 0 0 0  Worry too much - different things 0 0 0 0  Trouble relaxing 0 0 0 0  Restless 0 0 0 0  Easily annoyed or irritable 0 0 0 0  Afraid - awful might happen 0 0 0 0  Total GAD 7 Score 0 0 0 0  Anxiety Difficulty Not difficult at all Not difficult at all Not difficult at all Not difficult at all       05/10/2023   10:26 AM 11/26/2022    9:03 AM 11/25/2022   10:23 AM  Depression screen PHQ 2/9  Decreased Interest 0 0 0  Down, Depressed, Hopeless 0 0 0  PHQ - 2 Score 0 0 0  Altered sleeping 0 0 0  Tired, decreased energy 0 0 0  Change in appetite 0 0 0  Feeling bad or failure about yourself  0 0 0  Trouble concentrating 0 0 0  Moving slowly or fidgety/restless 0 0 0  Suicidal thoughts 0 0 0  PHQ-9 Score 0 0 0  Difficult doing work/chores Not difficult at all Not difficult at all Not difficult at all    BP Readings from Last 3 Encounters:  05/10/23 (!) 140/80  04/30/23 (!) 137/56  04/02/23 130/76    Physical Exam Vitals and nursing note reviewed. Exam conducted with a chaperone present.  Constitutional:      General: She is not in acute distress.    Appearance: She is not diaphoretic.  HENT:     Head: Normocephalic and atraumatic.     Right Ear: Tympanic membrane, ear canal and external ear normal.     Left Ear: Tympanic membrane, ear canal and external ear normal.     Nose: Nose normal. No congestion or rhinorrhea.     Mouth/Throat:     Mouth: Mucous membranes are moist.  Eyes:     General:        Right eye: No discharge.        Left eye: No discharge.     Conjunctiva/sclera: Conjunctivae normal.     Pupils: Pupils are equal, round, and reactive to light.  Neck:     Thyroid: No thyromegaly.      Vascular: No JVD.  Cardiovascular:     Rate and Rhythm: Normal rate and regular rhythm.     Heart sounds: Normal heart sounds. No murmur heard.    No friction rub. No gallop.  Pulmonary:     Effort: Pulmonary effort is normal.     Breath sounds: Normal breath sounds. No wheezing, rhonchi or rales.  Abdominal:     General: Bowel sounds are normal.     Palpations: Abdomen is soft. There is no mass.     Tenderness: There is no abdominal tenderness. There is no guarding.  Musculoskeletal:        General: Normal range of motion.     Cervical back: Normal range of motion and neck supple.  Lymphadenopathy:     Cervical: No cervical adenopathy.  Skin:    General: Skin is warm and dry.  Neurological:     Mental Status: She is alert.     Deep Tendon Reflexes: Reflexes are normal and symmetric.     Wt Readings from Last 3 Encounters:  05/10/23 181 lb (82.1 kg)  04/30/23 197 lb 5 oz (89.5 kg)  04/02/23 182 lb (82.6 kg)    BP (!) 140/80   Pulse 84   Ht 5\' 6"  (1.676 m)   Wt 181 lb (82.1 kg)   SpO2 95%   BMI 29.21 kg/m  [facility] AT [facility]                                   Transitional Care Clinic   [facility] Discharge Acute Issues Care Follow Up                                                                        Patient Demographics  Jalexy Guck, is a 77 y.o. female  DOB [birth year]  MRN [id number].  Primary MD  Duanne Limerick, MD   Reason for TCC follow Up -to review current stage of mental and physical recovery.   Past Medical History:  Diagnosis Date   Allergy    Chronic gastritis    Chronic low back pain with sciatica    COPD (chronic obstructive pulmonary disease) (HCC)    COVID-19 04/08/2023   Depression    Diverticulosis    Dysrhythmia    Complete Heart Block   Gastritis, chronic    GERD (gastroesophageal reflux disease)    Graves disease    H/O hepatitis     Hallux valgus of right foot    Hammer toe    History of hiatal hernia    History of shingles    Hyperlipidemia    Hypertension    Hypothyroidism    Incontinence    bladder and bowel   Paroxysmal atrial fibrillation (HCC)    Pneumonia 04/24/2023   Presence of permanent cardiac pacemaker    2012   Stroke [facility])     Past Surgical History:  Procedure Laterality Date   APPENDECTOMY     COLONOSCOPY  08/24/1998   COLONOSCOPY WITH PROPOFOL N/A 04/26/2015   Procedure: COLONOSCOPY WITH PROPOFOL;  Surgeon: Wallace Cullens, MD;  Location:  [facility] ENDOSCOPY;  Service: Gastroenterology;  Laterality: N/A;   COLONOSCOPY WITH PROPOFOL N/A 07/14/2021   Procedure: COLONOSCOPY WITH PROPOFOL;  Surgeon: Jaynie Collins, DO;  Location: [facility] ENDOSCOPY;  Service: Gastroenterology;  Laterality: N/A;   EYE SURGERY     fibroid tumors     fingers and wrist   FOOT SURGERY     IR LUMBAR PUNCTURE  04/27/2023   TONSILLECTOMY     TRANSFORAMINAL LUMBAR INTERBODY FUSION (TLIF) WITH PEDICLE SCREW FIXATION 1 LEVEL N/A 06/29/2018   Procedure: TRANSFORAMINAL LUMBAR INTERBODY FUSION (TLIF) WITH PEDICLE SCREW FIXATION 1 LEVEL;  Surgeon: Venetia Night, MD;  Location: [facility] ORS;  Service: Neurosurgery;  Laterality: N/A;   VAGINAL HYSTERECTOMY    Recent HPI and Hospital course as noted in admission note patient had a sudden change in mental status resulting in hospitalization and full workup.  There is no specific cause of the event that to the best of my knowledge that I have noted.  We will continue to evaluate on an as-needed basis but today's evaluation is unremarkable and not subject to any concerns.  Post Hospital acute care issue to be followed by clinic I do not see any repeat of labs as necessary other than we will need to recheck her hemoglobin since this was noted to be low for unknown reason and         Subjective:   Lynnett Cressey today has, No headache, No chest pain, No abdominal pain - No Nausea, No  new weakness tingling or numbness, No Cough - SOB..  Patient is back to baseline and is able to ambulate and drive on her own.   Patient has continued to gradually improve with ambulation and mentation as well.    Objective:   Vitals:   05/10/23 1020 05/10/23 1053  BP: (!) 140/80 124/76  Pulse: 84   SpO2: 95%   Weight: 181 lb (82.1 kg)   Height: 5\' 6"  (1.676 m)     Wt Readings from Last 3 Encounters:  05/10/23 181 lb (82.1 kg)  04/30/23 197 lb 5 oz (89.5 kg)  04/02/23 182 lb (82.6 kg)    Allergies as of 05/10/2023       Reactions   Baclofen Other (See Comments)   Weakness, confusion, tremors.    Penicillins Other (See Comments)   Has patient had a PCN reaction causing immediate rash, facial/tongue/throat swelling, SOB or lightheadedness with hypotension: No Has patient had a PCN reaction causing severe rash involving mucus membranes or skin necrosis: No Has patient had a PCN reaction that required hospitalization No Has patient had a PCN reaction occurring within the last 10 years: No If all of the above answers are "NO", then may proceed with Cephalosporin use.        Medication List        Accurate as of May 10, 2023 12:07 PM. If you have any questions, ask your nurse or doctor.          albuterol (2.5 MG/3ML) 0.083% nebulizer solution Commonly known as: PROVENTIL Take 3 mLs (2.5 mg total) by nebulization every 6 (six) hours as needed for wheezing or shortness of breath.   CENTRUM SILVER 50+WOMEN PO Take 1 tablet by mouth daily.   chlorpheniramine-HYDROcodone 10-8 MG/5ML Commonly known as: TUSSIONEX Take 5 mLs by mouth every 12 (twelve) hours as needed for cough.   citalopram 40 MG tablet Commonly known as: CELEXA TAKE 1 TABLET BY MOUTH EVERY DAY   gabapentin 300 MG capsule Commonly  known as: NEURONTIN Take 2 capsules (600 mg total) by mouth 3 (three) times daily.   gemfibrozil 600 MG tablet Commonly known as: LOPID TAKE 1 TABLET BY MOUTH  EVERY DAY   levothyroxine 112 MCG tablet Commonly known as: SYNTHROID Take 1 tablet (112 mcg total) by mouth daily.   loratadine 10 MG tablet Commonly known as: CLARITIN Take 1 tablet (10 mg total) by mouth daily.   metoprolol succinate 50 MG 24 hr tablet Commonly known as: TOPROL-XL Take 1 tablet (50 mg total) by mouth daily. TAKE WITH OR IMMEDIATELY FOLLOWING A MEAL.   montelukast 10 MG tablet Commonly known as: SINGULAIR TAKE 1 TABLET BY MOUTH EVERY DAY   nystatin cream Commonly known as: MYCOSTATIN APPLY TO AFFECTED AREA TWICE A DAY   omeprazole 20 MG capsule Commonly known as: PRILOSEC Take 20 mg by mouth daily.   ondansetron 4 MG tablet Commonly known as: ZOFRAN Take 1 tablet (4 mg total) by mouth every 8 (eight) hours as needed for nausea or vomiting.   rivaroxaban 20 MG Tabs tablet Commonly known as: XARELTO Take 20 mg by mouth daily with supper. Cardiologist [facility]   simvastatin 20 MG tablet Commonly known as: ZOCOR Take 1 tablet (20 mg total) by mouth daily.   Trelegy Ellipta 100-62.5-25 MCG/ACT Aepb Generic drug: Fluticasone-Umeclidin-Vilant Inhale 1 Inhaler into the lungs daily.         Physical Exam: Constitutional: Patient appears well-developed and well-nourished. Not in obvious distress. HENT: Normocephalic, atraumatic, External right and left ear normal. Oropharynx is clear and moist.  Eyes: Conjunctivae and EOM are normal. PERRLA, no scleral icterus. Neck: Normal ROM. Neck supple. No JVD. No tracheal deviation. No thyromegaly. CVS: RRR, S1/S2 +, no murmurs, no gallops, no carotid bruit.  Pulmonary: Effort and breath sounds normal, no stridor, rhonchi, wheezes, rales.  Abdominal: Soft. BS +, no distension, tenderness, rebound or guarding.  Musculoskeletal: Normal range of motion. No edema and no tenderness.  Lymphadenopathy: No lymphadenopathy noted, cervical, inguinal or axillary Neuro: Alert. Normal reflexes, muscle tone coordination. No  cranial nerve deficit. Skin: Skin is warm and dry. No rash noted. Not diaphoretic. No erythema. No pallor. Psychiatric: Normal mood and affect. Behavior, judgment, thought content normal.   Data Review   Micro Results No results found for this or any previous visit (from the past 240 hour(s)).   CBC No results for input(s): "WBC", "HGB", "HCT", "PLT", "MCV", "MCH", "MCHC", "RDW", "LYMPHSABS", "MONOABS", "EOSABS", "BASOSABS", "BANDABS" in the last 168 hours.  Invalid input(s): "NEUTRABS", "BANDSABD"  Chemistries  No results for input(s): "NA", "K", "CL", "CO2", "GLUCOSE", "BUN", "CREATININE", "CALCIUM", "MG", "AST", "ALT", "ALKPHOS", "BILITOT" in the last 168 hours.  Invalid input(s): "GFRCGP" ------------------------------------------------------------------------------------------------------------------ estimated creatinine clearance is 45.6 mL/min (A) (by C-G formula based on SCr of 1.17 mg/dL (H)). ------------------------------------------------------------------------------------------------------------------ No results for input(s): "HGBA1C" in the last 72 hours. ------------------------------------------------------------------------------------------------------------------ No results for input(s): "CHOL", "HDL", "LDLCALC", "TRIG", "CHOLHDL", "LDLDIRECT" in the last 72 hours. ------------------------------------------------------------------------------------------------------------------ No results for input(s): "TSH", "T4TOTAL", "T3FREE", "THYROIDAB" in the last 72 hours.  Invalid input(s): "FREET3" ------------------------------------------------------------------------------------------------------------------ No results for input(s): "VITAMINB12", "FOLATE", "FERRITIN", "TIBC", "IRON", "RETICCTPCT" in the last 72 hours.  Coagulation profile No results for input(s): "INR", "PROTIME" in the last 168 hours.  No results for input(s): "DDIMER" in the last 72  hours.  Cardiac Enzymes No results for input(s): "CKMB", "TROPONINI", "MYOGLOBIN" in the last 168 hours.  Invalid input(s): "CK" ------------------------------------------------------------------------------------------------------------------ Invalid input(s): "POCBNP" Time spent in minutes 30     Hershy Flenner  Yetta Barre M.D on 05/10/2023 at 12:07 PM   Disclaimer: This note may have been dictated with voice recognition software. Similar sounding words can inadvertently be transcribed and this note may contain transcription errors which may not have been corrected upon publication of note.   Assessment and Plan:  1. Transient alteration of awareness Patient recently hospitalized for evaluation of altered mental status that initially presented as delirium.  Subsequently there does not seem to be an absolute cause other than the possibility of aseptic meningitis.  Patient is gradually improved over the course of the the week and presents and her normal state of mental and physical status.  Examination is normal and there is no residual physical or mental concerns noted.  2. Flu vaccine need Discussed and administered - Flu Vaccine Trivalent High Dose (Fluad)    Elizabeth Sauer, MD

## 2023-05-11 ENCOUNTER — Telehealth: Payer: Self-pay

## 2023-05-11 ENCOUNTER — Telehealth: Payer: Self-pay | Admitting: Family Medicine

## 2023-05-11 NOTE — Telephone Encounter (Signed)
Copied from CRM [id number]. Topic: Appointment Scheduling - Scheduling Inquiry for Clinic >> May 11, 2023  8:33 AM Marlow Baars wrote: Reason for CRM: Junious Dresser OT with [facility] said the patient has declined because the spouse said she didn't need it.

## 2023-05-11 NOTE — Telephone Encounter (Signed)
Spoke to [place] with [facility]/OT- pt refuses further OT/PT with company. CB- [phone]

## 2023-05-12 DIAGNOSIS — E0520 Thyrotoxicosis with toxic multinodular goiter without thyrotoxic crisis or storm: Secondary | ICD-10-CM | POA: Diagnosis not present

## 2023-05-12 DIAGNOSIS — J4520 Mild intermittent asthma, uncomplicated: Secondary | ICD-10-CM | POA: Diagnosis not present

## 2023-05-12 DIAGNOSIS — J4489 Other specified chronic obstructive pulmonary disease: Secondary | ICD-10-CM | POA: Diagnosis not present

## 2023-05-12 DIAGNOSIS — I480 Paroxysmal atrial fibrillation: Secondary | ICD-10-CM | POA: Diagnosis not present

## 2023-05-12 DIAGNOSIS — J432 Centrilobular emphysema: Secondary | ICD-10-CM | POA: Diagnosis not present

## 2023-05-12 DIAGNOSIS — J849 Interstitial pulmonary disease, unspecified: Secondary | ICD-10-CM | POA: Diagnosis not present

## 2023-06-03 DIAGNOSIS — Z961 Presence of intraocular lens: Secondary | ICD-10-CM | POA: Diagnosis not present

## 2023-06-08 ENCOUNTER — Ambulatory Visit (INDEPENDENT_AMBULATORY_CARE_PROVIDER_SITE_OTHER): Payer: Medicare Other | Admitting: Family Medicine

## 2023-06-08 ENCOUNTER — Encounter: Payer: Self-pay | Admitting: Family Medicine

## 2023-06-08 VITALS — BP 130/76 | Ht 66.0 in | Wt 182.0 lb

## 2023-06-08 DIAGNOSIS — J4520 Mild intermittent asthma, uncomplicated: Secondary | ICD-10-CM | POA: Diagnosis not present

## 2023-06-08 DIAGNOSIS — E7849 Other hyperlipidemia: Secondary | ICD-10-CM

## 2023-06-08 DIAGNOSIS — I10 Essential (primary) hypertension: Secondary | ICD-10-CM | POA: Diagnosis not present

## 2023-06-08 DIAGNOSIS — E782 Mixed hyperlipidemia: Secondary | ICD-10-CM | POA: Diagnosis not present

## 2023-06-08 DIAGNOSIS — F32A Depression, unspecified: Secondary | ICD-10-CM

## 2023-06-08 DIAGNOSIS — E039 Hypothyroidism, unspecified: Secondary | ICD-10-CM

## 2023-06-08 DIAGNOSIS — J301 Allergic rhinitis due to pollen: Secondary | ICD-10-CM | POA: Diagnosis not present

## 2023-06-08 MED ORDER — METOPROLOL SUCCINATE ER 50 MG PO TB24
50.0000 mg | ORAL_TABLET | Freq: Every day | ORAL | 1 refills | Status: AC
Start: 2023-06-08 — End: ?

## 2023-06-08 MED ORDER — MONTELUKAST SODIUM 10 MG PO TABS: ORAL_TABLET | ORAL | 1 refills | Status: AC

## 2023-06-08 MED ORDER — LORATADINE 10 MG PO TABS: 10.0000 mg | ORAL_TABLET | Freq: Every day | ORAL | 1 refills | Status: AC

## 2023-06-08 MED ORDER — LEVOTHYROXINE SODIUM 112 MCG PO TABS
112.0000 ug | ORAL_TABLET | Freq: Every day | ORAL | 1 refills | Status: AC
Start: 2023-06-08 — End: ?

## 2023-06-08 MED ORDER — SIMVASTATIN 20 MG PO TABS
20.0000 mg | ORAL_TABLET | Freq: Every day | ORAL | 1 refills | Status: AC
Start: 2023-06-08 — End: ?

## 2023-06-08 MED ORDER — CITALOPRAM HYDROBROMIDE 40 MG PO TABS
40.0000 mg | ORAL_TABLET | Freq: Every day | ORAL | 1 refills | Status: AC
Start: 2023-06-08 — End: ?

## 2023-06-08 MED ORDER — GEMFIBROZIL 600 MG PO TABS: 600.0000 mg | ORAL_TABLET | Freq: Every day | ORAL | 1 refills | Status: AC

## 2023-06-08 NOTE — Progress Notes (Signed)
Date:  06/08/2023   Name:  Danielle Taylor   DOB:  [birth year]   MRN:  [id number]   Chief Complaint: Medication Refill, Hypertension, and Follow-up  Hypertension This is a chronic problem. The current episode started more than 1 year ago. The problem has been gradually improving since onset. The problem is controlled. Pertinent negatives include no chest pain, headaches, neck pain, palpitations or shortness of breath. Risk factors for coronary artery disease include dyslipidemia. There is no history of angina, CAD/MI or CVA. Identifiable causes of hypertension include a thyroid problem. There is no history of chronic renal disease, a hypertension causing med or renovascular disease.  Hyperlipidemia This is a chronic problem. The current episode started more than 1 year ago. The problem is controlled. Recent lipid tests were reviewed and are normal. She has no history of chronic renal disease. Pertinent negatives include no chest pain, myalgias or shortness of breath. Current antihyperlipidemic treatment includes fibric acid derivatives and statins. The current treatment provides moderate improvement of lipids. There are no compliance problems.   Thyroid Problem Presents for follow-up visit. Patient reports no constipation, diaphoresis, diarrhea, fatigue, hair loss, heat intolerance, hoarse voice, leg swelling, palpitations, visual change or weight gain. The symptoms have been stable. Her past medical history is significant for hyperlipidemia.  Depression        This is a chronic problem.  The current episode started more than 1 year ago.   The problem has been gradually improving since onset.  Associated symptoms include no decreased concentration, no fatigue, no helplessness, no hopelessness, does not have insomnia, not irritable, no restlessness, no decreased interest, no appetite change, no body aches, no myalgias, no headaches, no indigestion, not sad and no suicidal ideas.  Past  treatments include SSRIs - Selective serotonin reuptake inhibitors.  Past medical history includes thyroid problem.     Lab Results  Component Value Date   NA 136 04/30/2023   K 4.4 04/30/2023   CO2 20 (L) 04/30/2023   GLUCOSE 83 04/30/2023   BUN 22 04/30/2023   CREATININE 1.17 (H) 04/30/2023   CALCIUM 8.5 (L) 04/30/2023   EGFR 82 10/28/2022   GFRNONAA 49 (L) 04/30/2023   Lab Results  Component Value Date   CHOL 152 10/28/2022   HDL 50 10/28/2022   LDLCALC 86 10/28/2022   TRIG 87 10/28/2022   CHOLHDL 3.5 02/08/2017   Lab Results  Component Value Date   TSH 9.120 (H) 04/25/2023   Lab Results  Component Value Date   HGBA1C 6.3 (H) 03/11/2016   Lab Results  Component Value Date   WBC 10.3 04/30/2023   HGB 8.3 (L) 04/30/2023   HCT 27.6 (L) 04/30/2023   MCV 86.3 04/30/2023   PLT 221 04/30/2023   Lab Results  Component Value Date   ALT 29 04/25/2023   AST 40 04/25/2023   ALKPHOS 80 04/25/2023   BILITOT 1.0 04/25/2023   Lab Results  Component Value Date   VD25OH 37.43 04/27/2023     Review of Systems  Constitutional:  Negative for appetite change, diaphoresis, fatigue and weight gain.  HENT:  Negative for hoarse voice.   Eyes:  Negative for photophobia and visual disturbance.  Respiratory:  Negative for cough, chest tightness, shortness of breath, wheezing and stridor.   Cardiovascular:  Negative for chest pain and palpitations.  Gastrointestinal:  Negative for blood in stool, constipation and diarrhea.  Endocrine: Negative for heat intolerance and polyuria.  Musculoskeletal:  Negative for myalgias and  neck pain.  Neurological:  Negative for headaches.  Psychiatric/Behavioral:  Positive for depression. Negative for decreased concentration and suicidal ideas. The patient does not have insomnia.     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   Lumbar radiculopathy 06/29/2018   Chronic low back pain with sciatica 02/15/2018   Reactive airway disease, mild intermittent, uncomplicated 08/10/2017   Chronic seasonal allergic rhinitis due to pollen 08/10/2017   Mixed hyperlipidemia 08/10/2017   Depression 08/10/2017   Class 1 obesity due to excess calories without serious comorbidity with body mass index (BMI) of 34.0 to 34.9 in adult 08/10/2017   Chronic cough 06/15/2017   Interstitial lung disease (HCC) 06/15/2017   Centrilobular emphysema (HCC) 04/02/2017   Lumbar disc disease 04/02/2017   Hepatic steatosis 04/02/2017   Atherosclerosis of coronary artery of native heart 04/02/2017   Cardiac pacemaker in situ 01/06/2017   Paroxysmal A-fib (HCC) 03/30/2016   Right leg weakness 03/11/2016   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     Penicillins Other (See Comments)    Has patient had a PCN reaction causing immediate rash, facial/tongue/throat swelling, SOB or lightheadedness with  hypotension: No Has patient had a PCN reaction causing severe rash involving mucus membranes or skin necrosis: No Has patient had a PCN reaction that required hospitalization No Has patient had a PCN reaction occurring within the last 10 years: No If all of the above answers are "NO", then may proceed with Cephalosporin use.     Past Surgical History:  Procedure Laterality Date   APPENDECTOMY     COLONOSCOPY  08/24/1998   COLONOSCOPY WITH PROPOFOL N/A 04/26/2015   Procedure: COLONOSCOPY WITH PROPOFOL;  Surgeon: Wallace Cullens, MD;  Location: [facility] ENDOSCOPY;  Service: Gastroenterology;  Laterality: N/A;   COLONOSCOPY WITH PROPOFOL N/A 07/14/2021   Procedure: COLONOSCOPY WITH PROPOFOL;  Surgeon: Jaynie Collins, DO;  Location: [facility] ENDOSCOPY;  Service: Gastroenterology;  Laterality: N/A;   EYE SURGERY     fibroid tumors     fingers and wrist   FOOT SURGERY     IR LUMBAR PUNCTURE  04/27/2023   TONSILLECTOMY     TRANSFORAMINAL LUMBAR INTERBODY FUSION (TLIF) WITH PEDICLE SCREW FIXATION 1 LEVEL N/A 06/29/2018   Procedure: TRANSFORAMINAL LUMBAR INTERBODY FUSION (TLIF) WITH PEDICLE SCREW FIXATION 1 LEVEL;  Surgeon: Venetia Night, MD;  Location: [facility] ORS;  Service: Neurosurgery;  Laterality: N/A;   VAGINAL HYSTERECTOMY      Social History   Tobacco Use   Smoking status: Former    Current packs/day: 0.00  Average packs/day: 0.5 packs/day for 33.0 years (16.5 ttl pk-yrs)    Types: Cigarettes    Start date: 48    Quit date: 2003    Years since quitting: 21.8   Smokeless tobacco: Never   Tobacco comments:    smoking cessation materials not required  Vaping Use   Vaping status: Never Used  Substance Use Topics   Alcohol use: Yes    Alcohol/week: 1.0 standard drink of alcohol    Types: 1 Glasses of wine per week    Comment: occassional   Drug use: Never     Medication list has been reviewed and updated.  Current Meds  Medication Sig   albuterol (PROVENTIL) (2.5 MG/3ML)  0.083% nebulizer solution Take 3 mLs (2.5 mg total) by nebulization every 6 (six) hours as needed for wheezing or shortness of breath.   citalopram (CELEXA) 40 MG tablet TAKE 1 TABLET BY MOUTH EVERY DAY   Fluticasone-Umeclidin-Vilant (TRELEGY ELLIPTA) 100-62.5-25 MCG/ACT AEPB Inhale 1 Inhaler into the lungs daily.   gabapentin (NEURONTIN) 300 MG capsule Take 2 capsules (600 mg total) by mouth 3 (three) times daily.   gemfibrozil (LOPID) 600 MG tablet TAKE 1 TABLET BY MOUTH EVERY DAY   levothyroxine (SYNTHROID) 112 MCG tablet Take 1 tablet (112 mcg total) by mouth daily.   loratadine (CLARITIN) 10 MG tablet Take 1 tablet (10 mg total) by mouth daily.   metoprolol succinate (TOPROL-XL) 50 MG 24 hr tablet Take 1 tablet (50 mg total) by mouth daily. TAKE WITH OR IMMEDIATELY FOLLOWING A MEAL.   montelukast (SINGULAIR) 10 MG tablet TAKE 1 TABLET BY MOUTH EVERY DAY   Multiple Vitamins-Minerals (CENTRUM SILVER 50+WOMEN PO) Take 1 tablet by mouth daily.   omeprazole (PRILOSEC) 20 MG capsule Take 20 mg by mouth daily.   rivaroxaban (XARELTO) 20 MG TABS tablet Take 20 mg by mouth daily with supper. Cardiologist [facility]   simvastatin (ZOCOR) 20 MG tablet Take 1 tablet (20 mg total) by mouth daily.       05/10/2023   10:27 AM 11/25/2022   10:23 AM 10/28/2022   10:12 AM 09/10/2022    3:21 PM  GAD 7 : Generalized Anxiety Score  Nervous, Anxious, on Edge 0 0 0 0  Control/stop worrying 0 0 0 0  Worry too much - different things 0 0 0 0  Trouble relaxing 0 0 0 0  Restless 0 0 0 0  Easily annoyed or irritable 0 0 0 0  Afraid - awful might happen 0 0 0 0  Total GAD 7 Score 0 0 0 0  Anxiety Difficulty Not difficult at all Not difficult at all Not difficult at all Not difficult at all       05/10/2023   10:26 AM 11/26/2022    9:03 AM 11/25/2022   10:23 AM  Depression screen PHQ 2/9  Decreased Interest 0 0 0  Down, Depressed, Hopeless 0 0 0  PHQ - 2 Score 0 0 0  Altered sleeping 0 0 0  Tired, decreased energy  0 0 0  Change in appetite 0 0 0  Feeling bad or failure about yourself  0 0 0  Trouble concentrating 0 0 0  Moving slowly or fidgety/restless 0 0 0  Suicidal thoughts 0 0 0  PHQ-9 Score 0 0 0  Difficult doing work/chores Not difficult at all Not difficult at all Not difficult at all    BP Readings from Last 3 Encounters:  06/08/23 130/76  05/10/23 124/76  04/30/23 Marland Kitchen)  137/56    Physical Exam Vitals and nursing note reviewed. Exam conducted with a chaperone present.  Constitutional:      General: She is not irritable.She is not in acute distress.    Appearance: She is not diaphoretic.  HENT:     Head: Normocephalic and atraumatic.     Right Ear: Tympanic membrane and external ear normal.     Left Ear: Tympanic membrane and external ear normal.     Nose: Nose normal. No congestion or rhinorrhea.     Mouth/Throat:     Mouth: Mucous membranes are moist.  Eyes:     General:        Right eye: No discharge.        Left eye: No discharge.     Conjunctiva/sclera: Conjunctivae normal.     Pupils: Pupils are equal, round, and reactive to light.  Neck:     Thyroid: No thyromegaly.     Vascular: No JVD.  Cardiovascular:     Rate and Rhythm: Normal rate and regular rhythm.     Heart sounds: Normal heart sounds. No murmur heard.    No friction rub. No gallop.  Pulmonary:     Effort: Pulmonary effort is normal.     Breath sounds: Normal breath sounds. No wheezing, rhonchi or rales.  Chest:     Chest wall: No tenderness.  Abdominal:     General: Bowel sounds are normal.     Palpations: Abdomen is soft. There is no mass.     Tenderness: There is no abdominal tenderness. There is no guarding.  Musculoskeletal:        General: Normal range of motion.     Cervical back: Normal range of motion and neck supple.  Lymphadenopathy:     Cervical: No cervical adenopathy.  Skin:    General: Skin is warm and dry.     Findings: No bruising or erythema.  Neurological:     Mental Status:  She is alert.     Deep Tendon Reflexes: Reflexes are normal and symmetric.     Wt Readings from Last 3 Encounters:  06/08/23 182 lb (82.6 kg)  05/10/23 181 lb (82.1 kg)  04/30/23 197 lb 5 oz (89.5 kg)    BP 130/76 (BP Location: Left Arm, Patient Position: Sitting, Cuff Size: Normal)   Ht 5\' 6"  (1.676 m)   Wt 182 lb (82.6 kg)   BMI 29.38 kg/m   Assessment and Plan: 1. Depression, unspecified depression type Chronic.  Controlled.  Stable.  PHQ was 0 GAD score 0 continue citalopram 40 mg once a day.  Will recheck patient in 6 months. - citalopram (CELEXA) 40 MG tablet; Take 1 tablet (40 mg total) by mouth daily.  Dispense: 90 tablet; Refill: 1  2. Mixed hyperlipidemia Chronic.  Controlled.  Stable.  Continue gemfibrozil 600 mg daily as well as simvastatin 20 mg once a day.  Will recheck patient in 6 months. - gemfibrozil (LOPID) 600 MG tablet; Take 1 tablet (600 mg total) by mouth daily.  Dispense: 90 tablet; Refill: 1 - simvastatin (ZOCOR) 20 MG tablet; Take 1 tablet (20 mg total) by mouth daily.  Dispense: 90 tablet; Refill: 1  3. Familial multiple lipoprotein-type hyperlipidemia Chronic.  Controlled.  Stable.  Continue gemfibrozil 600 mg daily. - gemfibrozil (LOPID) 600 MG tablet; Take 1 tablet (600 mg total) by mouth daily.  Dispense: 90 tablet; Refill: 1  4. Hypothyroidism, unspecified type Chronic.  Controlled.  Stable.  Asymptomatic.  Tolerating current  dose of levothyroxine 112 mcg daily.  Will check thyroid panel with TSH for current level of control. - levothyroxine (SYNTHROID) 112 MCG tablet; Take 1 tablet (112 mcg total) by mouth daily.  Dispense: 90 tablet; Refill: 1 - Thyroid Panel With TSH  5. Chronic seasonal allergic rhinitis due to pollen Chronic.  Seasonal.  Currently stable.  But anticipating increase antigen load until first frost.  Patient will continue loratadine 10 mg and singular 10 mg daily. - loratadine (CLARITIN) 10 MG tablet; Take 1 tablet (10 mg total)  by mouth daily.  Dispense: 90 tablet; Refill: 1 - montelukast (SINGULAIR) 10 MG tablet; TAKE 1 TABLET BY MOUTH EVERY DAY  Dispense: 90 tablet; Refill: 1  6. Essential (primary) hypertension Chronic.  Controlled.  Stable.  Blood pressure 130/76.  Asymptomatic.  Tolerating current dosing of medication including Toprol-XL 50 mg daily.  Will recheck patient in 6 months. - metoprolol succinate (TOPROL-XL) 50 MG 24 hr tablet; Take 1 tablet (50 mg total) by mouth daily. TAKE WITH OR IMMEDIATELY FOLLOWING A MEAL.  Dispense: 90 tablet; Refill: 1  7. Reactive airway disease, mild intermittent, uncomplicated Chronic.  Intermittent.  Stable.  Continue Singulair 10 mg once a day. - montelukast (SINGULAIR) 10 MG tablet; TAKE 1 TABLET BY MOUTH EVERY DAY  Dispense: 90 tablet; Refill: 1     Elizabeth Sauer, MD

## 2023-06-09 ENCOUNTER — Encounter: Payer: Self-pay | Admitting: Family Medicine

## 2023-06-09 LAB — THYROID PANEL WITH TSH
Free Thyroxine Index: 2.3 (ref 1.2–4.9)
T3 Uptake Ratio: 26 % (ref 24–39)
T4, Total: 9 ug/dL (ref 4.5–12.0)
TSH: 1.45 u[IU]/mL (ref 0.450–4.500)

## 2023-06-11 DIAGNOSIS — M1712 Unilateral primary osteoarthritis, left knee: Secondary | ICD-10-CM | POA: Diagnosis not present

## 2023-06-29 DIAGNOSIS — M1712 Unilateral primary osteoarthritis, left knee: Secondary | ICD-10-CM | POA: Diagnosis not present

## 2023-07-06 DIAGNOSIS — M1712 Unilateral primary osteoarthritis, left knee: Secondary | ICD-10-CM | POA: Diagnosis not present

## 2023-07-08 DIAGNOSIS — I480 Paroxysmal atrial fibrillation: Secondary | ICD-10-CM | POA: Diagnosis not present

## 2023-07-08 DIAGNOSIS — Z45018 Encounter for adjustment and management of other part of cardiac pacemaker: Secondary | ICD-10-CM | POA: Diagnosis not present

## 2023-07-08 DIAGNOSIS — I2510 Atherosclerotic heart disease of native coronary artery without angina pectoris: Secondary | ICD-10-CM | POA: Diagnosis not present

## 2023-07-08 DIAGNOSIS — Z7901 Long term (current) use of anticoagulants: Secondary | ICD-10-CM | POA: Diagnosis not present

## 2023-07-08 DIAGNOSIS — I442 Atrioventricular block, complete: Secondary | ICD-10-CM | POA: Diagnosis not present

## 2023-07-08 DIAGNOSIS — I498 Other specified cardiac arrhythmias: Secondary | ICD-10-CM | POA: Diagnosis not present

## 2023-07-08 DIAGNOSIS — J449 Chronic obstructive pulmonary disease, unspecified: Secondary | ICD-10-CM | POA: Diagnosis not present

## 2023-07-08 DIAGNOSIS — Z950 Presence of cardiac pacemaker: Secondary | ICD-10-CM | POA: Diagnosis not present

## 2023-07-08 DIAGNOSIS — Z09 Encounter for follow-up examination after completed treatment for conditions other than malignant neoplasm: Secondary | ICD-10-CM | POA: Diagnosis not present

## 2023-07-08 DIAGNOSIS — E785 Hyperlipidemia, unspecified: Secondary | ICD-10-CM | POA: Diagnosis not present

## 2023-07-08 DIAGNOSIS — J432 Centrilobular emphysema: Secondary | ICD-10-CM | POA: Diagnosis not present

## 2023-07-08 DIAGNOSIS — I10 Essential (primary) hypertension: Secondary | ICD-10-CM | POA: Diagnosis not present

## 2023-09-01 DIAGNOSIS — J069 Acute upper respiratory infection, unspecified: Secondary | ICD-10-CM | POA: Diagnosis not present

## 2023-09-01 DIAGNOSIS — R7303 Prediabetes: Secondary | ICD-10-CM | POA: Diagnosis not present

## 2023-09-01 DIAGNOSIS — J441 Chronic obstructive pulmonary disease with (acute) exacerbation: Secondary | ICD-10-CM | POA: Diagnosis not present

## 2023-09-09 DIAGNOSIS — R0600 Dyspnea, unspecified: Secondary | ICD-10-CM | POA: Diagnosis not present

## 2023-09-09 DIAGNOSIS — J441 Chronic obstructive pulmonary disease with (acute) exacerbation: Secondary | ICD-10-CM | POA: Diagnosis not present

## 2023-10-08 DIAGNOSIS — F32A Depression, unspecified: Secondary | ICD-10-CM | POA: Diagnosis not present

## 2023-10-08 DIAGNOSIS — E039 Hypothyroidism, unspecified: Secondary | ICD-10-CM | POA: Diagnosis not present

## 2023-10-08 DIAGNOSIS — J432 Centrilobular emphysema: Secondary | ICD-10-CM | POA: Diagnosis not present

## 2023-11-04 DIAGNOSIS — J441 Chronic obstructive pulmonary disease with (acute) exacerbation: Secondary | ICD-10-CM | POA: Diagnosis not present

## 2023-11-20 DIAGNOSIS — I4891 Unspecified atrial fibrillation: Secondary | ICD-10-CM | POA: Diagnosis not present

## 2023-11-20 DIAGNOSIS — Z950 Presence of cardiac pacemaker: Secondary | ICD-10-CM | POA: Diagnosis not present

## 2023-12-19 ENCOUNTER — Other Ambulatory Visit: Payer: Self-pay | Admitting: Family Medicine

## 2023-12-19 DIAGNOSIS — E782 Mixed hyperlipidemia: Secondary | ICD-10-CM

## 2023-12-19 DIAGNOSIS — I10 Essential (primary) hypertension: Secondary | ICD-10-CM

## 2023-12-21 ENCOUNTER — Other Ambulatory Visit: Payer: Self-pay | Admitting: Family Medicine

## 2023-12-21 DIAGNOSIS — J301 Allergic rhinitis due to pollen: Secondary | ICD-10-CM

## 2023-12-21 DIAGNOSIS — J4520 Mild intermittent asthma, uncomplicated: Secondary | ICD-10-CM

## 2023-12-23 NOTE — Telephone Encounter (Signed)
 Requested medication (s) are due for refill today: yes  Requested medication (s) are on the active medication list: yes  Last refill:  06/08/23  Future visit scheduled: no  Notes to clinic:  Unable to refill per protocol, Routing for review of PCP, last OV with Dr. Rochelle Chu 06/16/23. Another PCP listed.      Requested Prescriptions  Pending Prescriptions Disp Refills   montelukast  (SINGULAIR ) 10 MG tablet [Pharmacy Med Name: MONTELUKAST  SOD 10 MG TABLET] 90 tablet 1    Sig: TAKE 1 TABLET BY MOUTH EVERY DAY     Pulmonology:  Leukotriene Inhibitors Failed - 12/23/2023 10:41 AM      Failed - Valid encounter within last 12 months    Recent Outpatient Visits   None

## 2023-12-29 ENCOUNTER — Other Ambulatory Visit: Payer: Self-pay | Admitting: Family Medicine

## 2023-12-29 DIAGNOSIS — Z1231 Encounter for screening mammogram for malignant neoplasm of breast: Secondary | ICD-10-CM

## 2024-01-07 DIAGNOSIS — I442 Atrioventricular block, complete: Secondary | ICD-10-CM | POA: Diagnosis not present

## 2024-01-07 DIAGNOSIS — Z45018 Encounter for adjustment and management of other part of cardiac pacemaker: Secondary | ICD-10-CM | POA: Diagnosis not present

## 2024-01-07 DIAGNOSIS — Z7901 Long term (current) use of anticoagulants: Secondary | ICD-10-CM | POA: Diagnosis not present

## 2024-01-07 DIAGNOSIS — I480 Paroxysmal atrial fibrillation: Secondary | ICD-10-CM | POA: Diagnosis not present

## 2024-01-10 ENCOUNTER — Ambulatory Visit
Admission: RE | Admit: 2024-01-10 | Discharge: 2024-01-10 | Disposition: A | Discharge status: Home / Self Care | Source: Ambulatory Visit | Attending: Family Medicine | Admitting: Family Medicine

## 2024-01-10 DIAGNOSIS — Z1231 Encounter for screening mammogram for malignant neoplasm of breast: Secondary | ICD-10-CM | POA: Insufficient documentation

## 2024-02-20 DIAGNOSIS — Z45018 Encounter for adjustment and management of other part of cardiac pacemaker: Secondary | ICD-10-CM | POA: Diagnosis not present

## 2024-03-13 DIAGNOSIS — E039 Hypothyroidism, unspecified: Secondary | ICD-10-CM | POA: Diagnosis not present

## 2024-03-13 DIAGNOSIS — Z1331 Encounter for screening for depression: Secondary | ICD-10-CM | POA: Diagnosis not present

## 2024-03-13 DIAGNOSIS — L538 Other specified erythematous conditions: Secondary | ICD-10-CM | POA: Diagnosis not present

## 2024-03-13 DIAGNOSIS — E7800 Pure hypercholesterolemia, unspecified: Secondary | ICD-10-CM | POA: Diagnosis not present

## 2024-03-13 DIAGNOSIS — R7303 Prediabetes: Secondary | ICD-10-CM | POA: Diagnosis not present

## 2024-04-06 DIAGNOSIS — I10 Essential (primary) hypertension: Secondary | ICD-10-CM | POA: Diagnosis not present

## 2024-04-06 DIAGNOSIS — Z0000 Encounter for general adult medical examination without abnormal findings: Secondary | ICD-10-CM | POA: Diagnosis not present

## 2024-04-06 DIAGNOSIS — E7800 Pure hypercholesterolemia, unspecified: Secondary | ICD-10-CM | POA: Diagnosis not present

## 2024-05-15 DIAGNOSIS — Z23 Encounter for immunization: Secondary | ICD-10-CM | POA: Diagnosis not present

## 2024-05-21 DIAGNOSIS — Z45018 Encounter for adjustment and management of other part of cardiac pacemaker: Secondary | ICD-10-CM | POA: Diagnosis not present

## 2024-07-14 DIAGNOSIS — I442 Atrioventricular block, complete: Secondary | ICD-10-CM | POA: Diagnosis not present

## 2024-07-14 DIAGNOSIS — I4891 Unspecified atrial fibrillation: Secondary | ICD-10-CM | POA: Diagnosis not present

## 2024-07-14 DIAGNOSIS — Z45018 Encounter for adjustment and management of other part of cardiac pacemaker: Secondary | ICD-10-CM | POA: Diagnosis not present

## 2024-07-14 DIAGNOSIS — Z7901 Long term (current) use of anticoagulants: Secondary | ICD-10-CM | POA: Diagnosis not present

## 2024-07-24 DIAGNOSIS — Z87891 Personal history of nicotine dependence: Secondary | ICD-10-CM | POA: Diagnosis not present

## 2024-07-24 DIAGNOSIS — J209 Acute bronchitis, unspecified: Secondary | ICD-10-CM | POA: Diagnosis not present

## 2024-07-24 DIAGNOSIS — J441 Chronic obstructive pulmonary disease with (acute) exacerbation: Secondary | ICD-10-CM | POA: Diagnosis not present

## 2024-07-24 DIAGNOSIS — J440 Chronic obstructive pulmonary disease with acute lower respiratory infection: Secondary | ICD-10-CM | POA: Diagnosis not present

## 2024-10-09 ENCOUNTER — Encounter: Admission: RE | Payer: Self-pay | Source: Home / Self Care

## 2024-10-09 ENCOUNTER — Ambulatory Visit: Admission: RE | Admit: 2024-10-09 | Source: Home / Self Care | Admitting: Gastroenterology
